# Patient Record
Sex: Female | Born: 1955 | Race: White | Hispanic: No | Marital: Married | Smoking: Never smoker
Health system: Southern US, Community
[De-identification: ages and names within clinical notes are randomized; demographics above are authoritative.]

## PROBLEM LIST (undated history)

## (undated) DIAGNOSIS — G629 Polyneuropathy, unspecified: Secondary | ICD-10-CM

## (undated) DIAGNOSIS — G43909 Migraine, unspecified, not intractable, without status migrainosus: Secondary | ICD-10-CM

## (undated) DIAGNOSIS — M797 Fibromyalgia: Secondary | ICD-10-CM

## (undated) DIAGNOSIS — O4400 Placenta previa specified as without hemorrhage, unspecified trimester: Secondary | ICD-10-CM

## (undated) DIAGNOSIS — Z8041 Family history of malignant neoplasm of ovary: Secondary | ICD-10-CM

## (undated) DIAGNOSIS — M1990 Unspecified osteoarthritis, unspecified site: Secondary | ICD-10-CM

## (undated) DIAGNOSIS — M3500 Sicca syndrome, unspecified: Secondary | ICD-10-CM

## (undated) DIAGNOSIS — M51369 Other intervertebral disc degeneration, lumbar region without mention of lumbar back pain or lower extremity pain: Secondary | ICD-10-CM

## (undated) DIAGNOSIS — M5136 Other intervertebral disc degeneration, lumbar region: Secondary | ICD-10-CM

## (undated) DIAGNOSIS — M5030 Other cervical disc degeneration, unspecified cervical region: Secondary | ICD-10-CM

## (undated) HISTORY — PX: HERNIA REPAIR: SHX51

## (undated) HISTORY — DX: Complete placenta previa nos or without hemorrhage, unspecified trimester: O44.00

## (undated) HISTORY — PX: REPLACEMENT TOTAL KNEE: SUR1224

## (undated) HISTORY — PX: CHOLECYSTECTOMY: SHX55

## (undated) HISTORY — PX: SHOULDER SURGERY: SHX246

## (undated) HISTORY — PX: CERVICAL FUSION: SHX112

## (undated) HISTORY — DX: Family history of malignant neoplasm of ovary: Z80.41

---

## 2012-08-17 DIAGNOSIS — M47817 Spondylosis without myelopathy or radiculopathy, lumbosacral region: Secondary | ICD-10-CM | POA: Insufficient documentation

## 2012-10-03 DIAGNOSIS — M545 Low back pain, unspecified: Secondary | ICD-10-CM | POA: Insufficient documentation

## 2013-01-18 DIAGNOSIS — L509 Urticaria, unspecified: Secondary | ICD-10-CM | POA: Insufficient documentation

## 2013-01-22 DIAGNOSIS — K5730 Diverticulosis of large intestine without perforation or abscess without bleeding: Secondary | ICD-10-CM | POA: Insufficient documentation

## 2013-01-22 DIAGNOSIS — F411 Generalized anxiety disorder: Secondary | ICD-10-CM | POA: Insufficient documentation

## 2013-01-22 DIAGNOSIS — K219 Gastro-esophageal reflux disease without esophagitis: Secondary | ICD-10-CM | POA: Insufficient documentation

## 2013-01-22 DIAGNOSIS — N952 Postmenopausal atrophic vaginitis: Secondary | ICD-10-CM | POA: Insufficient documentation

## 2013-01-22 DIAGNOSIS — G4700 Insomnia, unspecified: Secondary | ICD-10-CM | POA: Insufficient documentation

## 2013-01-22 DIAGNOSIS — D649 Anemia, unspecified: Secondary | ICD-10-CM | POA: Insufficient documentation

## 2013-01-22 DIAGNOSIS — J309 Allergic rhinitis, unspecified: Secondary | ICD-10-CM | POA: Insufficient documentation

## 2013-01-22 DIAGNOSIS — G43909 Migraine, unspecified, not intractable, without status migrainosus: Secondary | ICD-10-CM | POA: Insufficient documentation

## 2013-01-22 DIAGNOSIS — G4733 Obstructive sleep apnea (adult) (pediatric): Secondary | ICD-10-CM | POA: Insufficient documentation

## 2013-01-22 DIAGNOSIS — G2581 Restless legs syndrome: Secondary | ICD-10-CM | POA: Insufficient documentation

## 2013-01-22 DIAGNOSIS — M797 Fibromyalgia: Secondary | ICD-10-CM | POA: Insufficient documentation

## 2013-07-20 DIAGNOSIS — M2550 Pain in unspecified joint: Secondary | ICD-10-CM | POA: Insufficient documentation

## 2013-09-13 DIAGNOSIS — I10 Essential (primary) hypertension: Secondary | ICD-10-CM | POA: Insufficient documentation

## 2013-12-14 DIAGNOSIS — IMO0002 Reserved for concepts with insufficient information to code with codable children: Secondary | ICD-10-CM | POA: Insufficient documentation

## 2014-07-06 DIAGNOSIS — M5030 Other cervical disc degeneration, unspecified cervical region: Secondary | ICD-10-CM | POA: Insufficient documentation

## 2014-07-06 DIAGNOSIS — M542 Cervicalgia: Secondary | ICD-10-CM | POA: Insufficient documentation

## 2015-04-23 DIAGNOSIS — G43009 Migraine without aura, not intractable, without status migrainosus: Secondary | ICD-10-CM | POA: Insufficient documentation

## 2015-04-23 DIAGNOSIS — R519 Headache, unspecified: Secondary | ICD-10-CM | POA: Insufficient documentation

## 2015-04-23 DIAGNOSIS — R51 Headache: Secondary | ICD-10-CM

## 2015-07-23 DIAGNOSIS — M5416 Radiculopathy, lumbar region: Secondary | ICD-10-CM | POA: Insufficient documentation

## 2016-06-07 DIAGNOSIS — M75111 Incomplete rotator cuff tear or rupture of right shoulder, not specified as traumatic: Secondary | ICD-10-CM | POA: Insufficient documentation

## 2016-06-07 DIAGNOSIS — M7551 Bursitis of right shoulder: Secondary | ICD-10-CM | POA: Insufficient documentation

## 2016-12-15 DIAGNOSIS — Z8041 Family history of malignant neoplasm of ovary: Secondary | ICD-10-CM

## 2016-12-15 HISTORY — DX: Family history of malignant neoplasm of ovary: Z80.41

## 2016-12-29 ENCOUNTER — Encounter: Payer: Self-pay | Admitting: Emergency Medicine

## 2016-12-29 ENCOUNTER — Emergency Department
Admission: EM | Admit: 2016-12-29 | Discharge: 2016-12-30 | Disposition: A | Discharge status: Home / Self Care | Payer: 59 | Attending: Emergency Medicine | Admitting: Emergency Medicine

## 2016-12-29 ENCOUNTER — Ambulatory Visit (INDEPENDENT_AMBULATORY_CARE_PROVIDER_SITE_OTHER)
Admission: EM | Admit: 2016-12-29 | Discharge: 2016-12-29 | Disposition: A | Discharge status: Other Acute Inpatient Hospital | Payer: 59 | Source: Home / Self Care | Attending: Family Medicine | Admitting: Family Medicine

## 2016-12-29 DIAGNOSIS — R1031 Right lower quadrant pain: Secondary | ICD-10-CM

## 2016-12-29 DIAGNOSIS — Z79899 Other long term (current) drug therapy: Secondary | ICD-10-CM | POA: Insufficient documentation

## 2016-12-29 DIAGNOSIS — R110 Nausea: Secondary | ICD-10-CM

## 2016-12-29 DIAGNOSIS — R1033 Periumbilical pain: Secondary | ICD-10-CM

## 2016-12-29 HISTORY — DX: Migraine, unspecified, not intractable, without status migrainosus: G43.909

## 2016-12-29 HISTORY — DX: Fibromyalgia: M79.7

## 2016-12-29 HISTORY — DX: Unspecified osteoarthritis, unspecified site: M19.90

## 2016-12-29 HISTORY — DX: Polyneuropathy, unspecified: G62.9

## 2016-12-29 LAB — COMPREHENSIVE METABOLIC PANEL
ALT: 12 U/L — AB (ref 14–54)
ANION GAP: 6 (ref 5–15)
AST: 19 U/L (ref 15–41)
Albumin: 4.3 g/dL (ref 3.5–5.0)
Alkaline Phosphatase: 66 U/L (ref 38–126)
BUN: 13 mg/dL (ref 6–20)
CHLORIDE: 111 mmol/L (ref 101–111)
CO2: 26 mmol/L (ref 22–32)
CREATININE: 0.91 mg/dL (ref 0.44–1.00)
Calcium: 9.6 mg/dL (ref 8.9–10.3)
Glucose, Bld: 97 mg/dL (ref 65–99)
Potassium: 4 mmol/L (ref 3.5–5.1)
Sodium: 143 mmol/L (ref 135–145)
Total Bilirubin: 0.4 mg/dL (ref 0.3–1.2)
Total Protein: 7.4 g/dL (ref 6.5–8.1)

## 2016-12-29 LAB — CBC
HCT: 42 % (ref 35.0–47.0)
Hemoglobin: 14 g/dL (ref 12.0–16.0)
MCH: 29.1 pg (ref 26.0–34.0)
MCHC: 33.3 g/dL (ref 32.0–36.0)
MCV: 87.2 fL (ref 80.0–100.0)
PLATELETS: 251 10*3/uL (ref 150–440)
RBC: 4.81 MIL/uL (ref 3.80–5.20)
RDW: 14.6 % — ABNORMAL HIGH (ref 11.5–14.5)
WBC: 6.7 10*3/uL (ref 3.6–11.0)

## 2016-12-29 LAB — URINALYSIS, COMPLETE (UACMP) WITH MICROSCOPIC
Bacteria, UA: NONE SEEN
Bilirubin Urine: NEGATIVE
GLUCOSE, UA: NEGATIVE mg/dL
KETONES UR: NEGATIVE mg/dL
Nitrite: NEGATIVE
PROTEIN: NEGATIVE mg/dL
Specific Gravity, Urine: 1.003 — ABNORMAL LOW (ref 1.005–1.030)
pH: 7 (ref 5.0–8.0)

## 2016-12-29 LAB — LIPASE, BLOOD: LIPASE: 40 U/L (ref 11–51)

## 2016-12-29 MED ORDER — ONDANSETRON 8 MG PO TBDP
8.0000 mg | ORAL_TABLET | Freq: Once | ORAL | Status: AC
  Administered 2016-12-29: 8 mg via ORAL

## 2016-12-29 NOTE — ED Triage Notes (Signed)
Patient c/o pain in her abdomen at her belly button and to the right side since Sunday.  Patient reports some nausea.

## 2016-12-29 NOTE — ED Provider Notes (Signed)
[facility] Emergency Department Provider Note    First MD Initiated Contact with Patient 12/29/16 2310     (approximate)  I have reviewed the triage vital signs and the nursing notes.   HISTORY  Chief Complaint Abdominal Pain    HPI Amy Huynh is a 61 y.o. female with below list of chronic medical conditions presents to the emergency department with periumbilical/right lower quadrant abdominal pain with maximum intensity of 8 out of 10 currently 3 out of 10. Patient states that the pain began on Sunday (3 days ago) and is accompanied by nausea. Patient denies any vomiting no diarrhea or constipation.atient denies any urinary symptoms.   Past Medical History:  Diagnosis Date  . Arthritis   . Fibromyalgia   . Migraine   . Neuropathy     There are no active problems to display for this patient.   Past Surgical History:  Procedure Laterality Date  . CERVICAL FUSION    . CESAREAN SECTION    . CHOLECYSTECTOMY    . HERNIA REPAIR    . REPLACEMENT TOTAL KNEE Left   . SHOULDER SURGERY Right     Prior to Admission medications   Medication Sig Start Date End Date Taking? Authorizing Provider  butalbital-acetaminophen-caffeine (FIORICET, ESGIC) 50-325-40 MG tablet Take 1 tablet by mouth every 6 (six) hours as needed for headache.    [provider]  cyanocobalamin 1000 MCG tablet Take 1,000 mcg by mouth daily.    [provider]  dicyclomine (BENTYL) 20 MG tablet Take 1 tablet (20 mg total) by mouth 3 (three) times daily as needed for spasms. 12/30/16 12/30/17  Darci Current, MD  ergocalciferol (VITAMIN D2) 50000 units capsule Take 50,000 Units by mouth once a week.    [provider]  esomeprazole (NEXIUM) 40 MG capsule Take 40 mg by mouth daily at 12 noon.    [provider]  fexofenadine (ALLEGRA) 180 MG tablet Take 180 mg by mouth daily.    [provider]  gabapentin (NEURONTIN) 300 MG  capsule Take 900 mg by mouth at bedtime.    [provider]  hydrOXYzine (ATARAX/VISTARIL) 10 MG tablet Take 10 mg by mouth every 8 (eight) hours as needed.    [provider]  hyoscyamine (LEVSIN, ANASPAZ) 0.125 MG tablet Take 0.125 mg by mouth every 4 (four) hours as needed.    [provider]  promethazine (PHENERGAN) 25 MG tablet Take 25 mg by mouth every 6 (six) hours as needed for nausea or vomiting.    [provider]  rizatriptan (MAXALT) 10 MG tablet Take 10 mg by mouth as needed for migraine. May repeat in 2 hours if needed    [provider]  tiZANidine (ZANAFLEX) 4 MG tablet Take 4 mg by mouth at bedtime.    [provider]  topiramate (TOPAMAX) 100 MG tablet Take 200 mg by mouth daily.    [provider]  verapamil (VERELAN PM) 120 MG 24 hr capsule Take 120 mg by mouth at bedtime.    [provider]  zolpidem (AMBIEN CR) 12.5 MG CR tablet Take 12.5 mg by mouth at bedtime as needed for sleep.    [provider]    Allergies Cymbalta [duloxetine hcl]; Lyrica [pregabalin]; and Morphine and related  History reviewed. No pertinent family history.  Social History Social History  Substance Use Topics  . Smoking status: Never Smoker  . Smokeless tobacco: Never Used  . Alcohol use Yes  Review of Systems Constitutional: No fever/chills Eyes: No visual changes. ENT: No sore throat. Cardiovascular: Denies chest pain. Respiratory: Denies shortness of breath. Gastrointestinal:positive for abdominal pain and nausea Genitourinary: Negative for dysuria. Musculoskeletal: Negative for neck pain.  Negative for back pain. Integumentary: Negative for rash. Neurological: Negative for headaches, focal weakness or numbness.   ____________________________________________   PHYSICAL EXAM:  VITAL SIGNS: ED Triage Vitals  Enc Vitals Group     BP 12/29/16 2050 (!) 162/83     Pulse Rate 12/29/16 2050 61      Resp 12/29/16 2050 16     Temp 12/29/16 2050 98.3 F (36.8 C)     Temp Source 12/29/16 2050 Oral     SpO2 12/29/16 2050 100 %     Weight 12/29/16 2050 79.4 kg (175 lb)     Height --      Head Circumference --      Peak Flow --      Pain Score 12/29/16 2300 3     Pain Loc --      Pain Edu? --      Excl. in GC? --     Constitutional: Alert and oriented. Well appearing and in no acute distress. Eyes: Conjunctivae are normal.  Head: Atraumatic. Mouth/Throat: Mucous membranes are moist. Oropharynx non-erythematous. Neck: No stridor.   Cardiovascular: Normal rate, regular rhythm. Good peripheral circulation. Grossly normal heart sounds. Respiratory: Normal respiratory effort.  No retractions. Lungs CTAB. Gastrointestinal: RLQ/Per umbilical tenderness to palpation. No distention.   Musculoskeletal: No lower extremity tenderness nor edema. No gross deformities of extremities. Neurologic:  Normal speech and language. No gross focal neurologic deficits are appreciated.  Skin:  Skin is warm, dry and intact. No rash noted. Psychiatric: Mood and affect are normal. Speech and behavior are normal.  ____________________________________________   LABS (all labs ordered are listed, but only abnormal results are displayed)  Labs Reviewed  COMPREHENSIVE METABOLIC PANEL - Abnormal; Notable for the following:       Result Value   ALT 12 (*)    All other components within normal limits  CBC - Abnormal; Notable for the following:    RDW 14.6 (*)    All other components within normal limits  URINALYSIS, COMPLETE (UACMP) WITH MICROSCOPIC - Abnormal; Notable for the following:    Color, Urine STRAW (*)    APPearance CLEAR (*)    Specific Gravity, Urine 1.003 (*)    Hgb urine dipstick SMALL (*)    Leukocytes, UA TRACE (*)    Squamous Epithelial / LPF 0-5 (*)    All other components within normal limits  LIPASE, BLOOD    RADIOLOGY I, Bridgeton N Jeannett Dekoning, personally viewed and evaluated these  images (plain radiographs) as part of my medical decision making, as well as reviewing the written report by the radiologist.  Ct Abdomen Pelvis W Contrast  Result Date: 12/30/2016 CLINICAL DATA:  Lower abdominal pain EXAM: CT ABDOMEN AND PELVIS WITH CONTRAST TECHNIQUE: Multidetector CT imaging of the abdomen and pelvis was performed using the standard protocol following bolus administration of intravenous contrast. CONTRAST:  100mL ISOVUE-300 IOPAMIDOL (ISOVUE-300) INJECTION 61% COMPARISON:  None. FINDINGS: Lower chest: No acute abnormality. Hepatobiliary: Status post cholecystectomy. Mild intra and extrahepatic biliary dilatation, extrahepatic bile duct enlarged up to 13 mm. No focal hepatic abnormality Pancreas: Unremarkable. No pancreatic ductal dilatation or surrounding inflammatory changes. Spleen: Normal in size without focal abnormality. Adrenals/Urinary Tract: Adrenal glands are unremarkable. Kidneys are normal, without renal calculi, focal lesion, or  hydronephrosis. Bladder is unremarkable. Stomach/Bowel: Stomach is within normal limits. Appendix appears normal. No evidence of bowel wall thickening, distention, or inflammatory changes. Vascular/Lymphatic: No significant vascular findings are present. No enlarged abdominal or pelvic lymph nodes. Reproductive: Uterus and bilateral adnexa are unremarkable. Other: Negative for free air or free fluid. Musculoskeletal: Degenerative changes. No acute or suspicious bone lesion IMPRESSION: No CT evidence for acute intra-abdominal or pelvic pathology. Negative for appendicitis. Electronically Signed   By: Jasmine Pang M.D.   On: 12/30/2016 00:29     Procedures   ____________________________________________   INITIAL IMPRESSION / ASSESSMENT AND PLAN / ED COURSE  Pertinent labs & imaging results that were available during my care of the patient were reviewed by me and considered in my medical decision making (see chart for details).  No clear  etiology for the patient's abdominal pain identified a CT scan is negative likewise laboratory data including UA      ____________________________________________  FINAL CLINICAL IMPRESSION(S) / ED DIAGNOSES  Final diagnoses:  Right lower quadrant abdominal pain     MEDICATIONS GIVEN DURING THIS VISIT:  Medications  iopamidol (ISOVUE-300) 61 % injection 100 mL (100 mLs Intravenous Contrast Given 12/30/16 0009)  dicyclomine (BENTYL) capsule 10 mg (10 mg Oral Given 12/30/16 0037)     NEW OUTPATIENT MEDICATIONS STARTED DURING THIS VISIT:  Discharge Medication List as of 12/30/2016 12:57 AM    START taking these medications   Details  dicyclomine (BENTYL) 20 MG tablet Take 1 tablet (20 mg total) by mouth 3 (three) times daily as needed for spasms., Starting Thu 12/30/2016, Until Fri 12/30/2017, Print        Discharge Medication List as of 12/30/2016 12:57 AM      Discharge Medication List as of 12/30/2016 12:57 AM       Note:  This document was prepared using Dragon voice recognition software and may include unintentional dictation errors.    Darci Current, MD 12/30/16 [phone]

## 2016-12-29 NOTE — ED Provider Notes (Addendum)
[facility] ____________________________________________  Time seen: Approximately 8:30 PM  I have reviewed the triage vital signs and the nursing notes.   HISTORY  Chief Complaint Abdominal Pain   HPI Amy Huynh is a 61 y.o. female  presenting with husband at bedside, for evaluation of mid abdominal pain at her bellybutton that has began to radiate to her right lower quadrant. Patient states that she has been having abdominal pain for the last 3 days with accompanying nausea and bloated gassy sensation. States that she has tried over-the-counter Gas-X as well as Zofran without any resolution. medications taken today. describes pain as a current mild to moderate pain with some intermittent increase in intensity. states she did notice that as the pain increased with walking and moving around the pain remains at rest. Reports prior history of cholecystectomy, C-section and IBS. Reports last bowel movement today and described as normal. Denies abnormal color. No actual vomiting. Reports some flushing sensation, denies known fevers. No history of similar in past.   Denies chest pain, shortness of breath, dysuria, extremity pain, extremity swelling or rash. Denies recent sickness. Denies recent antibiotic use. Patient also reports family history of onset with ovarian cancer and states suspected  female cancer for mother and grandmother as well.    Past Medical History:  Diagnosis Date  . Arthritis   . Fibromyalgia   . Migraine   . Neuropathy     There are no active problems to display for this patient.   Past Surgical History:  Procedure Laterality Date  . CERVICAL FUSION    . CESAREAN SECTION    . CHOLECYSTECTOMY    . HERNIA REPAIR    . REPLACEMENT TOTAL KNEE Left   . SHOULDER SURGERY Right      No current facility-administered medications for this encounter.   Current Outpatient Prescriptions:  .  butalbital-acetaminophen-caffeine (FIORICET, ESGIC)  50-325-40 MG tablet, Take 1 tablet by mouth every 6 (six) hours as needed for headache., Disp: , Rfl:  .  cyanocobalamin 1000 MCG tablet, Take 1,000 mcg by mouth daily., Disp: , Rfl:  .  ergocalciferol (VITAMIN D2) 50000 units capsule, Take 50,000 Units by mouth once a week., Disp: , Rfl:  .  esomeprazole (NEXIUM) 40 MG capsule, Take 40 mg by mouth daily at 12 noon., Disp: , Rfl:  .  fexofenadine (ALLEGRA) 180 MG tablet, Take 180 mg by mouth daily., Disp: , Rfl:  .  gabapentin (NEURONTIN) 300 MG capsule, Take 900 mg by mouth at bedtime., Disp: , Rfl:  .  hydrOXYzine (ATARAX/VISTARIL) 10 MG tablet, Take 10 mg by mouth every 8 (eight) hours as needed., Disp: , Rfl:  .  hyoscyamine (LEVSIN, ANASPAZ) 0.125 MG tablet, Take 0.125 mg by mouth every 4 (four) hours as needed., Disp: , Rfl:  .  promethazine (PHENERGAN) 25 MG tablet, Take 25 mg by mouth every 6 (six) hours as needed for nausea or vomiting., Disp: , Rfl:  .  rizatriptan (MAXALT) 10 MG tablet, Take 10 mg by mouth as needed for migraine. May repeat in 2 hours if needed, Disp: , Rfl:  .  tiZANidine (ZANAFLEX) 4 MG tablet, Take 4 mg by mouth at bedtime., Disp: , Rfl:  .  topiramate (TOPAMAX) 100 MG tablet, Take 200 mg by mouth daily., Disp: , Rfl:  .  verapamil (VERELAN PM) 120 MG 24 hr capsule, Take 120 mg by mouth at bedtime., Disp: , Rfl:  .  zolpidem (AMBIEN CR) 12.5 MG CR tablet, Take 12.5  mg by mouth at bedtime as needed for sleep., Disp: , Rfl:   Allergies Cymbalta [duloxetine hcl]; Lyrica [pregabalin]; and Morphine and related  family history Mother-hysterectomy when young- possible cancer Aunt; ovarian cancer  Social History Social History  Substance Use Topics  . Smoking status: Never Smoker  . Smokeless tobacco: Never Used  . Alcohol use Yes    Review of Systems ENT: No sore throat. Cardiovascular: Denies chest pain. Respiratory: Denies shortness of breath. Gastrointestinal: as above.  Genitourinary: Negative for  dysuria. Musculoskeletal: Negative for back pain. Skin: Negative for rash.  ____________________________________________   PHYSICAL EXAM:  VITAL SIGNS: ED Triage Vitals  Enc Vitals Group     BP 12/29/16 1941 (!) 166/65     Pulse Rate 12/29/16 1941 69     Resp 12/29/16 1941 16     Temp 12/29/16 1941 98.2 F (36.8 C)     Temp Source 12/29/16 1941 Oral     SpO2 12/29/16 1941 100 %     Weight 12/29/16 1937 175 lb (79.4 kg)     Height 12/29/16 1937 5' 2.5" (1.588 m)     Head Circumference --      Peak Flow --      Pain Score 12/29/16 1937 6     Pain Loc --      Pain Edu? --      Excl. in GC? --     Constitutional: Alert and oriented. Well appearing and in no acute distress. Cardiovascular: Normal rate, regular rhythm. Grossly normal heart sounds.  Good peripheral circulation. Respiratory: Normal respiratory effort without tachypnea nor retractions. Breath sounds are clear and equal bilaterally. No wheezes, rales, rhonchi. Gastrointestinal:  mild to moderate focal umbilical and right lower quadrant tenderness at McBurney's point, pain to right lower quadrant present with tapping of right heel. Abdomen otherwise soft and nontender. Normal Bowel sounds. No CVA tenderness. Musculoskeletal:  Steady gait. Neurologic:  Normal speech and language. Speech is normal. No gait instability.  Skin:  Skin is warm, dry Psychiatric: Mood and affect are normal. Speech and behavior are normal. Patient exhibits appropriate insight and judgment   ___________________________________________   LABS (all labs ordered are listed, but only abnormal results are displayed)  Labs Reviewed - No data to display  PROCEDURES Procedures     INITIAL IMPRESSION / ASSESSMENT AND PLAN / ED COURSE  Pertinent labs & imaging results that were available during my care of the patient were reviewed by me and considered in my medical decision making (see chart for details).  Well-appearing patient. Initially  with vitamin Casimiro Needle pain is now radiating to right lower quadrant. Discussed in detail with patient and spouse, concern for appendicitis. No CT available at this facility at this time. Recommend further evaluation emergency room of her choice this time. Patient states that she will go to [facility]. Triage nurse Butch, called and given report. Patient stable at time of discharge. States husband will transport her. Encouraged NPO. 8mg  odt zofran given.   ____________________________________________   FINAL CLINICAL IMPRESSION(S) / ED DIAGNOSES  Final diagnoses:  Right lower quadrant abdominal pain  Periumbilical abdominal pain  Nausea without vomiting     Discharge Medication List as of 12/29/2016  8:10 PM      Note: This dictation was prepared with Dragon dictation along with smaller phrase technology. Any transcriptional errors that result from this process are unintentional.         Renford Dills, NP 12/29/16 2109

## 2016-12-29 NOTE — ED Notes (Addendum)
ED Provider at bedside. 

## 2016-12-29 NOTE — ED Notes (Signed)
Patient c/o lower medial abdominal pain radiating to LRQ. Pt reports nausea, denies fever, emesis and diarrhea.

## 2016-12-29 NOTE — Discharge Instructions (Signed)
Proceed directly to Emergency room.

## 2016-12-29 NOTE — ED Triage Notes (Signed)
Pt c/o lower abdominal pain ;ocated under the umbilicus, radiating to the right side. Pt seen at urgent care in advised to come to ED for further evaluation of possible appendicitis. Pt has had pain and nausea since Sunday. Pt denies emesis and denies urinary symptoms.

## 2016-12-29 NOTE — ED Notes (Addendum)
MD BRown at bedside at this time with this RN for assessment and update of care plan

## 2016-12-30 ENCOUNTER — Emergency Department: Payer: 59

## 2016-12-30 ENCOUNTER — Encounter: Payer: Self-pay | Admitting: Radiology

## 2016-12-30 IMAGING — CT CT ABD-PELV W/ CM
2 of 5 series · 16 of 46 positions shown, 18 images · IV contrast (APPLIED)
Comparison: None.

CLINICAL DATA: Lower abdominal pain

EXAM:
CT ABDOMEN AND PELVIS WITH CONTRAST
TECHNIQUE: Multidetector CT imaging of the abdomen and pelvis was performed
using the standard protocol following bolus administration of
intravenous contrast.
CONTRAST:  100mL [9M] IOPAMIDOL ([9M]) INJECTION 61%

[Series 2: routine abd/pel with · axial · 0.82mm/px · z∈[-721,-276]mm · 13 of 99 slices shown, 15 images]
[im 5/99  soft-tissue]
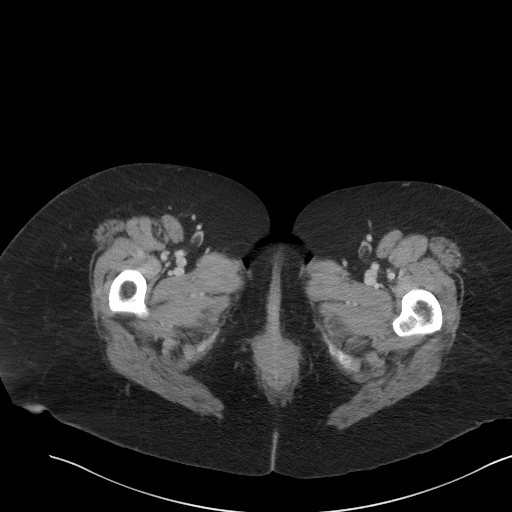
[im 5/99  bone]
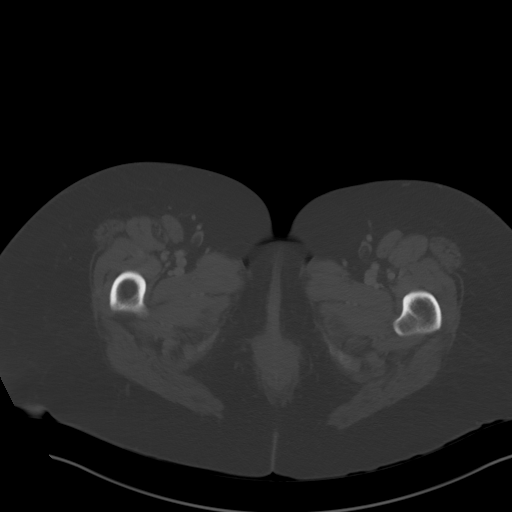
[im 15/99  soft-tissue]
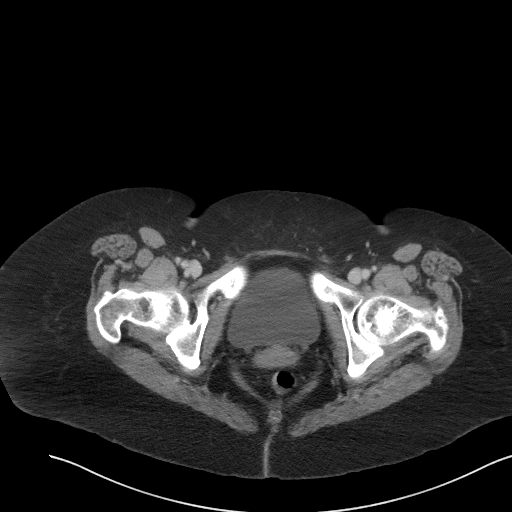
[im 20/99  soft-tissue]
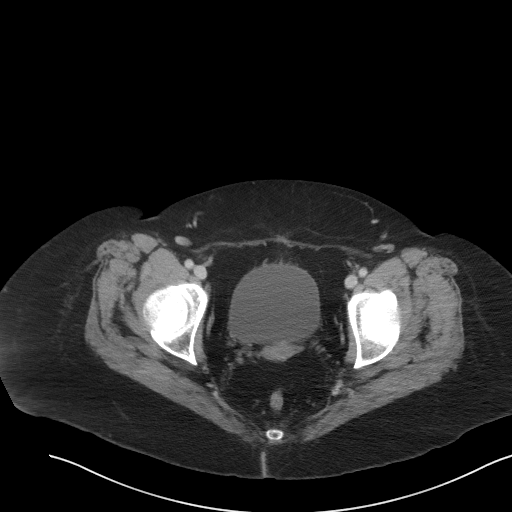
[im 30/99  soft-tissue]
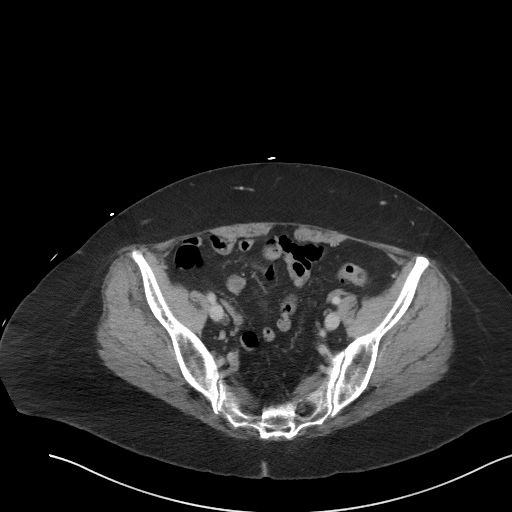
[im 35/99  soft-tissue]
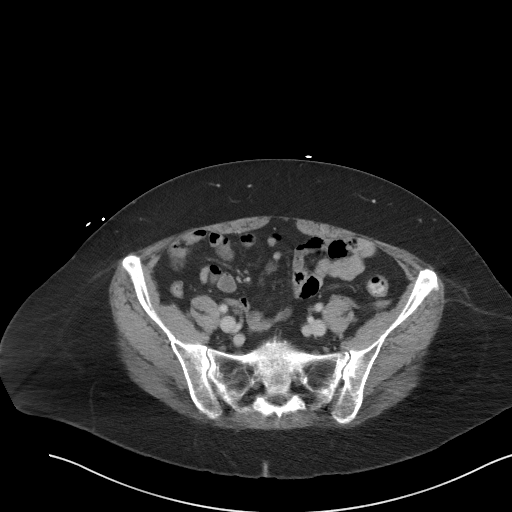
[im 45/99  soft-tissue]
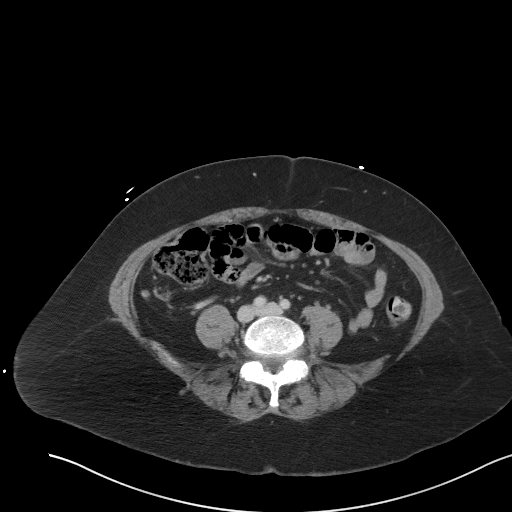
[im 50/99  soft-tissue]
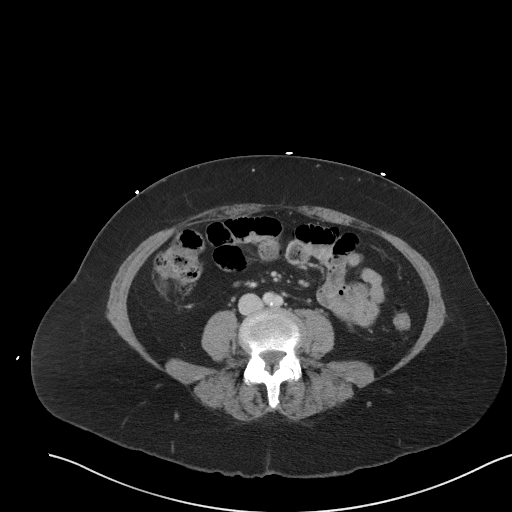
[im 54/99  soft-tissue]
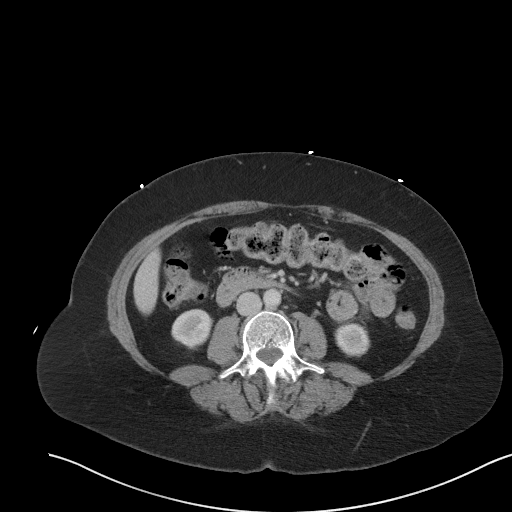
[im 64/99  soft-tissue]
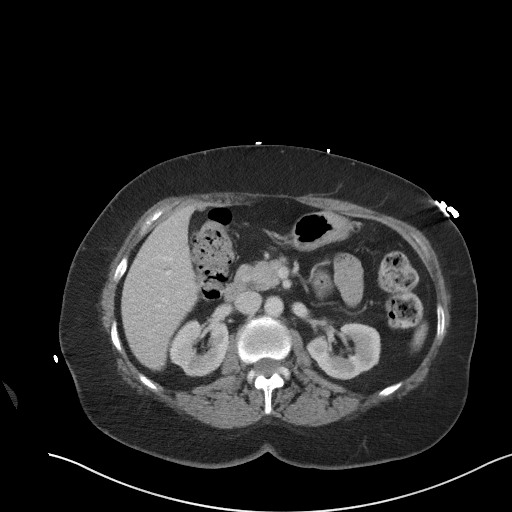
[im 64/99  bone]
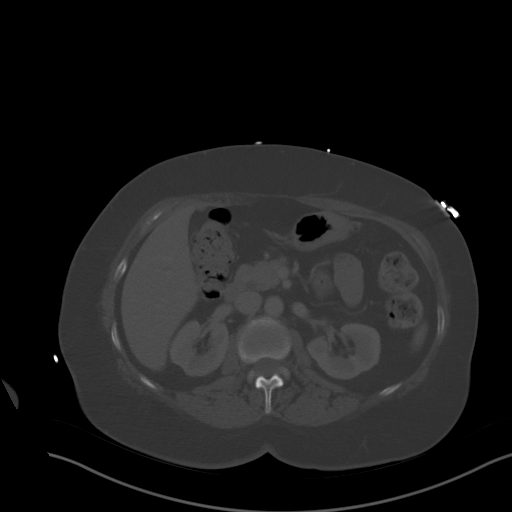
[im 69/99  soft-tissue]
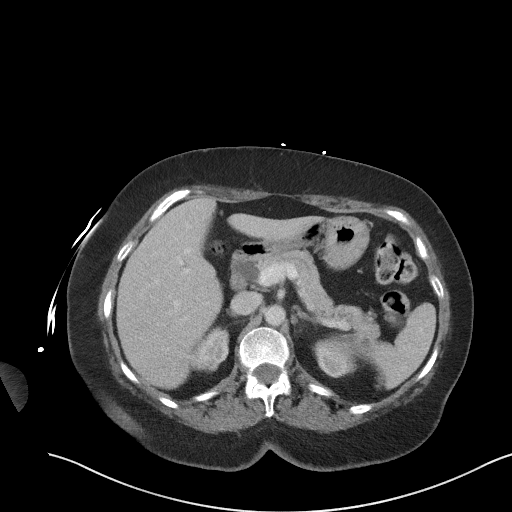
[im 79/99  soft-tissue]
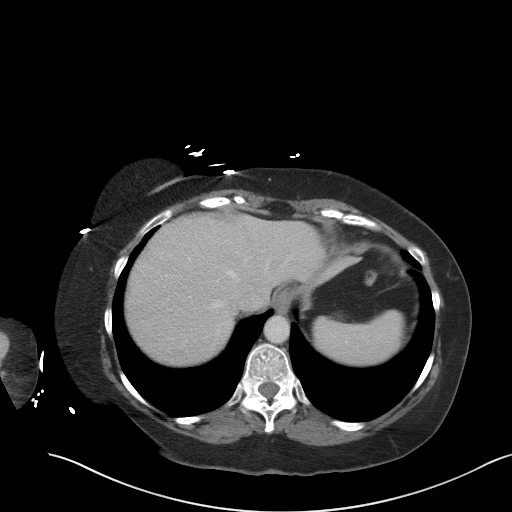
[im 84/99  soft-tissue]
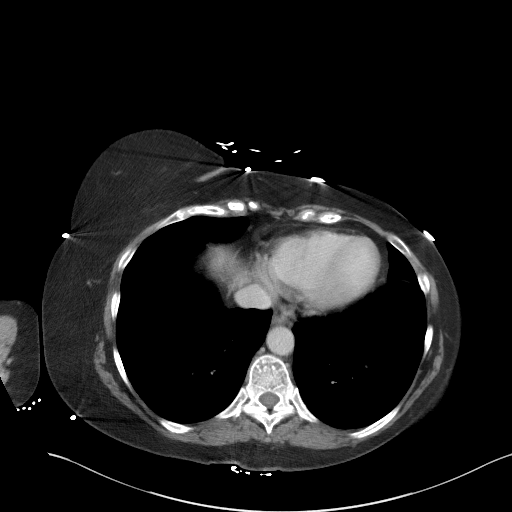
[im 94/99  soft-tissue]
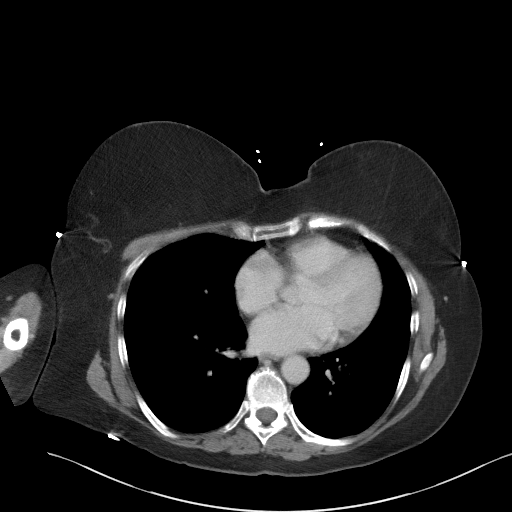

[Series 5: coronal st · coronal · 0.68mm/px · 3 of 88 slices shown]
[im 30/88  soft-tissue]
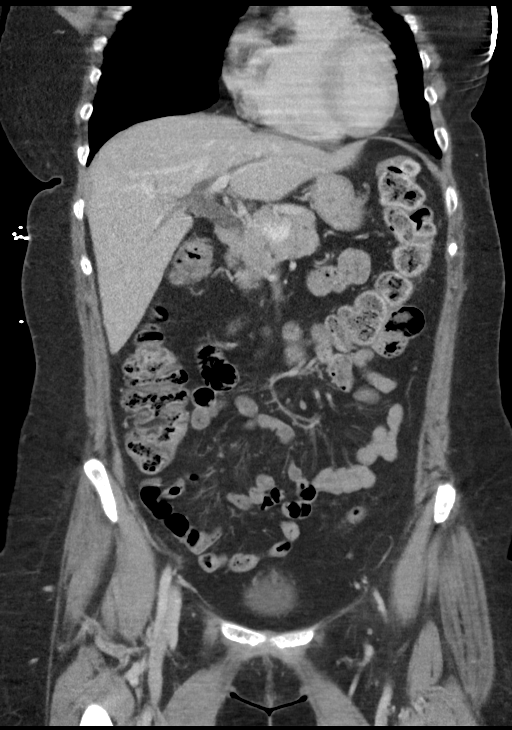
[im 39/88  soft-tissue]
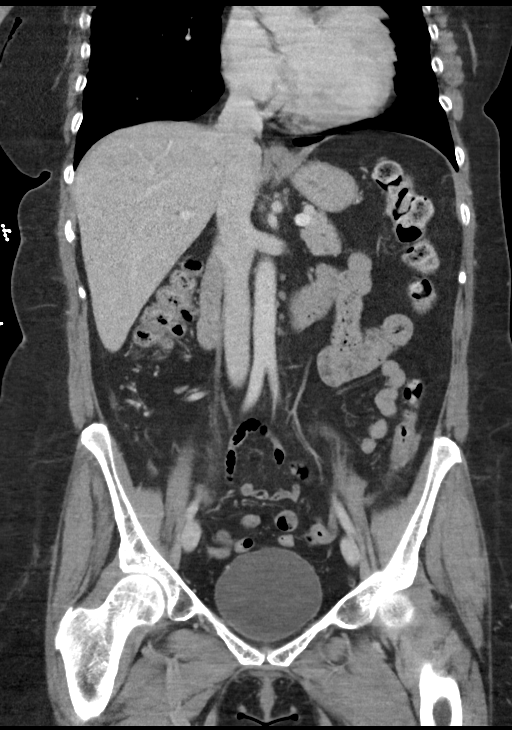
[im 49/88  soft-tissue]
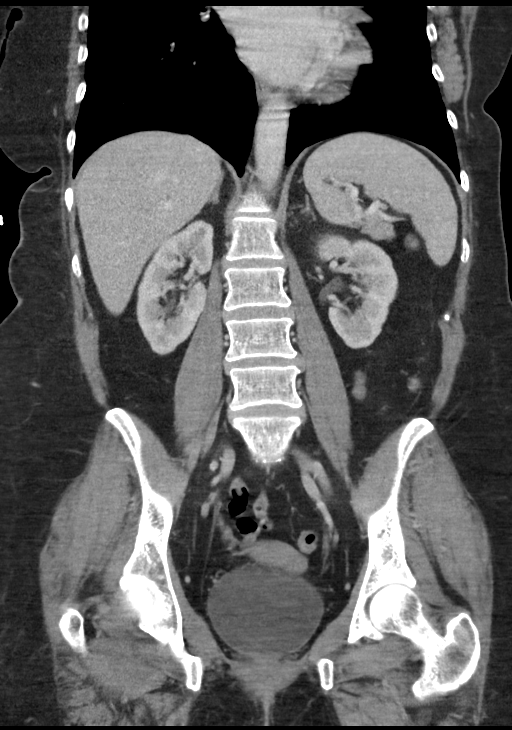

[16 of 46 positions shown; findings below may reference images not displayed]

FINDINGS: Lower chest: No acute abnormality.

Hepatobiliary: Status post cholecystectomy. Mild intra and
extrahepatic biliary dilatation, extrahepatic bile duct enlarged up
to 13 mm. No focal hepatic abnormality

Pancreas: Unremarkable. No pancreatic ductal dilatation or
surrounding inflammatory changes.

Spleen: Normal in size without focal abnormality.

Adrenals/Urinary Tract: Adrenal glands are unremarkable. Kidneys are
normal, without renal calculi, focal lesion, or hydronephrosis.
Bladder is unremarkable.

Stomach/Bowel: Stomach is within normal limits. Appendix appears
normal. No evidence of bowel wall thickening, distention, or
inflammatory changes.

Vascular/Lymphatic: No significant vascular findings are present. No
enlarged abdominal or pelvic lymph nodes.

Reproductive: Uterus and bilateral adnexa are unremarkable.

Other: Negative for free air or free fluid.

Musculoskeletal: Degenerative changes. No acute or suspicious bone
lesion
IMPRESSION: No CT evidence for acute intra-abdominal or pelvic pathology.
Negative for appendicitis.

## 2016-12-30 MED ORDER — DICYCLOMINE HCL 20 MG PO TABS: 20.0000 mg | ORAL_TABLET | Freq: Three times a day (TID) | ORAL | 0 refills | Status: DC | PRN

## 2016-12-30 MED ORDER — IOPAMIDOL (ISOVUE-300) INJECTION 61%
100.0000 mL | Freq: Once | INTRAVENOUS | Status: AC | PRN
  Administered 2016-12-30: 100 mL via INTRAVENOUS

## 2016-12-30 MED ORDER — DICYCLOMINE HCL 10 MG PO CAPS
10.0000 mg | ORAL_CAPSULE | Freq: Once | ORAL | Status: AC
  Administered 2016-12-30: 10 mg via ORAL
  Filled 2016-12-30: qty 1

## 2016-12-30 NOTE — ED Notes (Signed)
Pt. Going home with spouse.

## 2016-12-30 NOTE — ED Notes (Signed)
ED Provider at bedside. 

## 2017-01-11 ENCOUNTER — Encounter: Payer: Self-pay | Admitting: Obstetrics and Gynecology

## 2017-01-11 ENCOUNTER — Ambulatory Visit (INDEPENDENT_AMBULATORY_CARE_PROVIDER_SITE_OTHER): Payer: 59 | Admitting: Obstetrics and Gynecology

## 2017-01-11 VITALS — BP 124/78 | Ht 62.0 in | Wt 175.0 lb

## 2017-01-11 DIAGNOSIS — Z8041 Family history of malignant neoplasm of ovary: Secondary | ICD-10-CM

## 2017-01-11 DIAGNOSIS — R1031 Right lower quadrant pain: Secondary | ICD-10-CM

## 2017-01-11 NOTE — Progress Notes (Signed)
Chief Complaint  Patient presents with  . Pelvic Pain  . Follow-up    ER Follow Up    HPI:      Amy Huynh is a 62 y.o. [phone] who LMP was No LMP recorded. Patient is postmenopausal., presents today for NP ER f/u for RLQ pain. Sx started about 2 wks ago and were severe. She went to ED and had neg CT scan and urine, and was referred to GI. She has a hx of diverticulosis, IBS and colon polyps on past colonoscopy. She has noted nausea, fatigue, and anorexia recently, with some mild bloating. She denies any wt loss, fevers, urin sx. Pain has improved the past 2 days. She has tried hyoscyamine without relief of pain.   She is no longer sex active due to vaginal dryness and no arousal. She went through menopause at age 35.   She has a FH of ovar cancer in her aunt and uterine cancer in her mom and MGM. Genetic testing not done.     Past Medical History:  Diagnosis Date  . Arthritis   . Fibromyalgia   . Migraine   . Neuropathy   . Placenta previa     Past Surgical History:  Procedure Laterality Date  . CERVICAL FUSION    . CESAREAN SECTION    . CHOLECYSTECTOMY    . HERNIA REPAIR    . REPLACEMENT TOTAL KNEE Left   . SHOULDER SURGERY Right     Family History  Problem Relation Age of Onset  . Ovarian cancer Maternal Aunt 30  . Uterine cancer Mother        7s  . Uterine cancer Maternal Grandmother        ? age    Social History   Social History  . Marital status: Married    Spouse name: N/A  . Number of children: N/A  . Years of education: N/A   Occupational History  . Not on file.   Social History Main Topics  . Smoking status: Never Smoker  . Smokeless tobacco: Never Used  . Alcohol use Yes  . Drug use: Unknown  . Sexual activity: Not Currently    Birth control/ protection: Post-menopausal   Other Topics Concern  . Not on file   Social History Narrative  . No narrative on file     Current Outpatient Prescriptions:  .   butalbital-acetaminophen-caffeine (FIORICET, ESGIC) 50-325-40 MG tablet, Take 1 tablet by mouth every 6 (six) hours as needed for headache., Disp: , Rfl:  .  cyanocobalamin 1000 MCG tablet, Take 1,000 mcg by mouth daily., Disp: , Rfl:  .  dicyclomine (BENTYL) 20 MG tablet, Take 1 tablet (20 mg total) by mouth 3 (three) times daily as needed for spasms., Disp: 30 tablet, Rfl: 0 .  ergocalciferol (VITAMIN D2) 50000 units capsule, Take 50,000 Units by mouth once a week., Disp: , Rfl:  .  esomeprazole (NEXIUM) 40 MG capsule, Take 40 mg by mouth daily at 12 noon., Disp: , Rfl:  .  fexofenadine (ALLEGRA) 180 MG tablet, Take 180 mg by mouth daily., Disp: , Rfl:  .  gabapentin (NEURONTIN) 300 MG capsule, Take 900 mg by mouth at bedtime., Disp: , Rfl:  .  hydrOXYzine (ATARAX/VISTARIL) 10 MG tablet, Take 10 mg by mouth every 8 (eight) hours as needed., Disp: , Rfl:  .  hyoscyamine (LEVSIN, ANASPAZ) 0.125 MG tablet, Take 0.125 mg by mouth every 4 (four) hours as needed., Disp: , Rfl:  .  promethazine (PHENERGAN) 25 MG tablet, Take 25 mg by mouth every 6 (six) hours as needed for nausea or vomiting., Disp: , Rfl:  .  rizatriptan (MAXALT) 10 MG tablet, Take 10 mg by mouth as needed for migraine. May repeat in 2 hours if needed, Disp: , Rfl:  .  tiZANidine (ZANAFLEX) 4 MG tablet, Take 4 mg by mouth at bedtime., Disp: , Rfl:  .  topiramate (TOPAMAX) 100 MG tablet, Take 200 mg by mouth daily., Disp: , Rfl:  .  verapamil (VERELAN PM) 120 MG 24 hr capsule, Take 120 mg by mouth at bedtime., Disp: , Rfl:  .  zolpidem (AMBIEN CR) 12.5 MG CR tablet, Take 12.5 mg by mouth at bedtime as needed for sleep., Disp: , Rfl:    ROS:  Review of Systems  Constitutional: Positive for appetite change. Negative for chills and fever.  Gastrointestinal: Positive for nausea. Negative for blood in stool, constipation, diarrhea and vomiting.  Genitourinary: Positive for pelvic pain. Negative for dyspareunia, dysuria, flank pain,  frequency, hematuria, urgency, vaginal bleeding, vaginal discharge and vaginal pain.  Musculoskeletal: Negative for back pain.  Skin: Negative for rash.     OBJECTIVE:   Vitals:  BP 124/78   Ht 5\' 2"  (1.575 m)   Wt 175 lb (79.4 kg)   BMI 32.01 kg/m   Physical Exam  Constitutional: She is oriented to person, place, and time and well-developed, well-nourished, and in no distress. Vital signs are normal.  Abdominal: There is tenderness in the right lower quadrant and suprapubic area. There is no rebound and no guarding.  Genitourinary: Vagina normal, uterus normal, cervix normal, left adnexa normal and vulva normal. Uterus is not enlarged. Cervix exhibits no motion tenderness and no tenderness. Right adnexum displays tenderness. Right adnexum displays no mass. Left adnexum displays no mass and no tenderness. Vulva exhibits no erythema, no exudate, no lesion, no rash and no tenderness. Vagina exhibits no lesion.  Neurological: She is oriented to person, place, and time.  Psychiatric: Mood, memory, affect and judgment normal.  Vitals reviewed.   Assessment/Plan: RLQ abdominal pain - Neg CT scan at ED. Hx of diverticulosis and current anorexia. Most likely GI etiology. Tender on exam. F/u with GI (appt 02/14/17). Sx improving x 2 days.   Family history of ovarian cancer - MyRisk testing discussed and done today. Will call pt with results.  - Plan: Integrated [facility]     Return if symptoms worsen or fail to improve.  Siddhi Dornbush B. Shaquira Moroz, PA-C 01/11/2017 9:27 AM

## 2017-01-20 ENCOUNTER — Other Ambulatory Visit: Payer: Self-pay | Admitting: Family Medicine

## 2017-01-24 ENCOUNTER — Encounter: Payer: Self-pay | Admitting: Obstetrics and Gynecology

## 2017-01-31 ENCOUNTER — Telehealth: Payer: Self-pay | Admitting: Obstetrics and Gynecology

## 2017-01-31 ENCOUNTER — Encounter: Payer: Self-pay | Admitting: Obstetrics and Gynecology

## 2017-01-31 NOTE — Telephone Encounter (Signed)
Spoke with pt re: neg MyRisk results. Riskscore=3.8%. Nothing further to do at this time. Hard copy mailed to pt for her records. She is aware neg testing does not mean she will never get any of the hereditary cancers. F/u prn.  RLQ pain also improving. Pt has GI appt 02/14/17.

## 2017-02-14 ENCOUNTER — Encounter: Payer: Self-pay | Admitting: Gastroenterology

## 2017-02-14 ENCOUNTER — Other Ambulatory Visit: Payer: Self-pay

## 2017-02-14 ENCOUNTER — Ambulatory Visit (INDEPENDENT_AMBULATORY_CARE_PROVIDER_SITE_OTHER): Payer: 59 | Admitting: Gastroenterology

## 2017-02-14 VITALS — BP 162/84 | HR 52 | Ht 62.5 in | Wt 177.0 lb

## 2017-02-14 DIAGNOSIS — K582 Mixed irritable bowel syndrome: Secondary | ICD-10-CM

## 2017-02-14 NOTE — Progress Notes (Signed)
Gastroenterology Consultation  Referring Provider:     No ref. provider found Primary Care Physician:  Oletha Blend, MD Primary Gastroenterologist:  Dr. Servando Snare     Reason for Consultation:     Lower abdominal pain        HPI:   Amy Huynh is a 61 y.o. y/o female referred for consultation & management of Lower abdominal pain by Dr. Oletha Blend, MD.  This patient comes in today to establish care with me since she has previously followed up at [facility].  The patient states her insurance is change in she needs to follow-up with either do or [facility].  The patient has had a colonoscopy in the past with an adenoma found at that time.  The patient also reports that she was told to have a repeat colonoscopy in 5 years.  The patient now reports that she had severe abdominal pain in her lower abdomen that to be a few weeks but has completely resolved.  The patient was seen by her physical therapist who told her that she should go to urgent care for possible appendicitis.  The patient went to urgent care and a sensitive to the ER.  She reports that the ER did a CT scan and found no appendicitis in [facility] home.  She reports that her symptoms were not consistent with her typical irritable bowel syndromes.  The patient denies any black stools or bloody stools.  She also denies any fevers chills nausea or vomiting.  There is no report of any unexplained weight loss.  Her irritable bowel syndrome consists of alternating diarrhea and constipation.  Past Medical History:  Diagnosis Date  . Arthritis   . Family history of ovarian cancer 12/2016   MyRisk neg  . Fibromyalgia   . Migraine   . Neuropathy   . Placenta previa     Past Surgical History:  Procedure Laterality Date  . CERVICAL FUSION    . CESAREAN SECTION    . CHOLECYSTECTOMY    . HERNIA REPAIR    . REPLACEMENT TOTAL KNEE Left   . SHOULDER SURGERY Right     Prior to Admission medications   Medication Sig Start Date End  Date Taking? Authorizing Provider  aspirin-acetaminophen-caffeine (EXCEDRIN MIGRAINE) [phone] MG tablet Take by mouth.   Yes [provider]  butalbital-acetaminophen-caffeine (FIORICET, ESGIC) 50-325-40 MG tablet Take 1 tablet by mouth every 6 (six) hours as needed for headache.   Yes [provider]  cyanocobalamin 1000 MCG tablet Take 1,000 mcg by mouth daily.   Yes [provider]  dicyclomine (BENTYL) 20 MG tablet Take 1 tablet (20 mg total) by mouth 3 (three) times daily as needed for spasms. 12/30/16 12/30/17 Yes Darci Current, MD  Doxepin HCl 5 % CREA Apply three times a day to itchy areas 07/14/16  Yes [provider]  ERENUMAB-AOOE [place] Inject into the skin. 12/27/16  Yes [provider]  ergocalciferol (VITAMIN D2) 50000 units capsule Take 50,000 Units by mouth once a week.   Yes [provider]  esomeprazole (NEXIUM) 40 MG capsule Take 40 mg by mouth daily at 12 noon.   Yes [provider]  estradiol (ESTRACE) 0.1 MG/GM vaginal cream I 0.5 GRAMS VAGINALLY 2 TIMES A WEEK AT NIGHT UTD 08/09/16  Yes [provider]  fexofenadine (ALLEGRA) 180 MG tablet Take 180 mg by mouth daily.   Yes [provider]  fluocinonide cream (LIDEX) 0.05 % Apply topically. 07/14/16 07/14/17  Yes [provider]  gabapentin (NEURONTIN) 300 MG capsule Take 900 mg by mouth at bedtime.   Yes [provider]  Gabapentin Enacarbil ER 300 MG TBCR Take by mouth. 07/27/16  Yes [provider]  hydrOXYzine (ATARAX/VISTARIL) 10 MG tablet Take 10 mg by mouth every 8 (eight) hours as needed.   Yes [provider]  hyoscyamine (LEVSIN, ANASPAZ) 0.125 MG tablet Take 0.125 mg by mouth every 4 (four) hours as needed.   Yes [provider]  Naltrexone POWD Take by mouth. 01/05/17  Yes [provider]  promethazine (PHENERGAN) 25 MG tablet Take 25 mg by mouth every 6 (six) hours as needed for nausea or  vomiting.   Yes [provider]  rizatriptan (MAXALT) 10 MG tablet Take 10 mg by mouth as needed for migraine. May repeat in 2 hours if needed   Yes [provider]  tiZANidine (ZANAFLEX) 4 MG tablet Take 4 mg by mouth at bedtime.   Yes [provider]  topiramate (TOPAMAX) 100 MG tablet Take 200 mg by mouth daily.   Yes [provider]  valACYclovir (VALTREX) 1000 MG tablet Take 1 tablet once a day by mouth 07/14/16  Yes [provider]  verapamil (VERELAN PM) 120 MG 24 hr capsule Take 120 mg by mouth at bedtime.   Yes [provider]  zolpidem (AMBIEN CR) 12.5 MG CR tablet Take 12.5 mg by mouth at bedtime as needed for sleep.   Yes [provider]    Family History  Problem Relation Age of Onset  . Ovarian cancer Maternal Aunt 30  . Uterine cancer Mother        79s  . Uterine cancer Maternal Grandmother        ? age     Social History  Substance Use Topics  . Smoking status: Never Smoker  . Smokeless tobacco: Never Used  . Alcohol use Yes    Allergies as of 02/14/2017 - Review Complete 02/14/2017  Allergen Reaction Noted  . Hydrocodone-acetaminophen  09/24/2015  . Cymbalta [duloxetine hcl] Other (See Comments) 12/29/2016  . Lyrica [pregabalin] Other (See Comments) 12/29/2016  . Morphine and related Itching 12/29/2016    Review of Systems:    All systems reviewed and negative except where noted in HPI.   Physical Exam:  BP (!) 162/84   Pulse (!) 52   Ht 5' 2.5" (1.588 m)   Wt 177 lb (80.3 kg)   BMI 31.86 kg/m  No LMP recorded. Patient is postmenopausal. Psych:  Alert and cooperative. Normal mood and affect. General:   Alert,  Well-developed, well-nourished, pleasant and cooperative in NAD Head:  Normocephalic and atraumatic. Eyes:  Sclera clear, no icterus.   Conjunctiva pink. Ears:  Normal auditory acuity. Nose:  No deformity, discharge, or lesions. Mouth:  No deformity or lesions,oropharynx pink &  moist. Neck:  Supple; no masses or thyromegaly. Lungs:  Respirations even and unlabored.  Clear throughout to auscultation.   No wheezes, crackles, or rhonchi. No acute distress. Heart:  Regular rate and rhythm; no murmurs, clicks, rubs, or gallops. Abdomen:  Normal bowel sounds.  No bruits.  Soft, non-tender and non-distended without masses, hepatosplenomegaly or hernias noted.  No guarding or rebound tenderness.  Negative Carnett sign.   Rectal:  Deferred.  Msk:  Symmetrical without gross deformities.  Good, equal movement & strength bilaterally. Pulses:  Normal pulses noted. Extremities:  No clubbing or edema.  No cyanosis. Neurologic:  Alert and oriented x3;  grossly normal neurologically. Skin:  Intact without significant lesions or rashes.  No jaundice. Lymph Nodes:  No significant cervical adenopathy. Psych:  Alert and cooperative. Normal mood and affect.  Imaging Studies: No results found.  Assessment and Plan:   Amy Huynh is a 61 y.o. y/o female who has a history of irritable bowel syndrome who is now transferring her care to me since her insurance has changed.  The patient has no further abdominal pain at this time.  The pain is not reproducible on physical exam today.  The patient has been told that if the pain returns she should try and do maneuvers to see if this is musculoskeletal pain since is not consistent with her irritable bowel syndrome and her CT scan was negative.  If the pain return and it is not consistent with musculoskeletal pain she has been told to come back and see me. The patient has been explained the plan and agrees with it.  Midge Minium, MD. Clementeen Graham   Note: This dictation was prepared with Dragon dictation along with smaller phrase technology. Any transcriptional errors that result from this process are unintentional.

## 2017-08-02 ENCOUNTER — Emergency Department: Payer: BC Managed Care – PPO

## 2017-08-02 ENCOUNTER — Other Ambulatory Visit: Payer: Self-pay

## 2017-08-02 ENCOUNTER — Encounter: Payer: Self-pay | Admitting: Emergency Medicine

## 2017-08-02 DIAGNOSIS — I10 Essential (primary) hypertension: Secondary | ICD-10-CM | POA: Diagnosis not present

## 2017-08-02 DIAGNOSIS — Z79899 Other long term (current) drug therapy: Secondary | ICD-10-CM | POA: Diagnosis not present

## 2017-08-02 DIAGNOSIS — R079 Chest pain, unspecified: Secondary | ICD-10-CM | POA: Diagnosis present

## 2017-08-02 LAB — BASIC METABOLIC PANEL
ANION GAP: 10 (ref 5–15)
BUN: 17 mg/dL (ref 6–20)
CHLORIDE: 101 mmol/L (ref 101–111)
CO2: 26 mmol/L (ref 22–32)
Calcium: 9.2 mg/dL (ref 8.9–10.3)
Creatinine, Ser: 0.84 mg/dL (ref 0.44–1.00)
GFR calc non Af Amer: >60 mL/min (ref >60)
Glucose, Bld: 105 mg/dL — ABNORMAL HIGH (ref 65–99)
Potassium: 3.5 mmol/L (ref 3.5–5.1)
Sodium: 137 mmol/L (ref 135–145)

## 2017-08-02 LAB — CBC
HCT: 44.3 % (ref 35.0–47.0)
HEMOGLOBIN: 14.4 g/dL (ref 12.0–16.0)
MCH: 27.2 pg (ref 26.0–34.0)
MCHC: 32.4 g/dL (ref 32.0–36.0)
MCV: 83.9 fL (ref 80.0–100.0)
Platelets: 282 10*3/uL (ref 150–440)
RBC: 5.28 MIL/uL — AB (ref 3.80–5.20)
RDW: 13.1 % (ref 11.5–14.5)
WBC: 8.6 10*3/uL (ref 3.6–11.0)

## 2017-08-02 LAB — TROPONIN I

## 2017-08-02 IMAGING — CR DG CHEST 2V
2 series · 2 of 2 positions shown · non-contrast
Comparison: None.

CLINICAL DATA: Hypertension for the past week. Left shoulder,
scapular and chest pain.

EXAM:
CHEST - 2 VIEW

[chest pa]
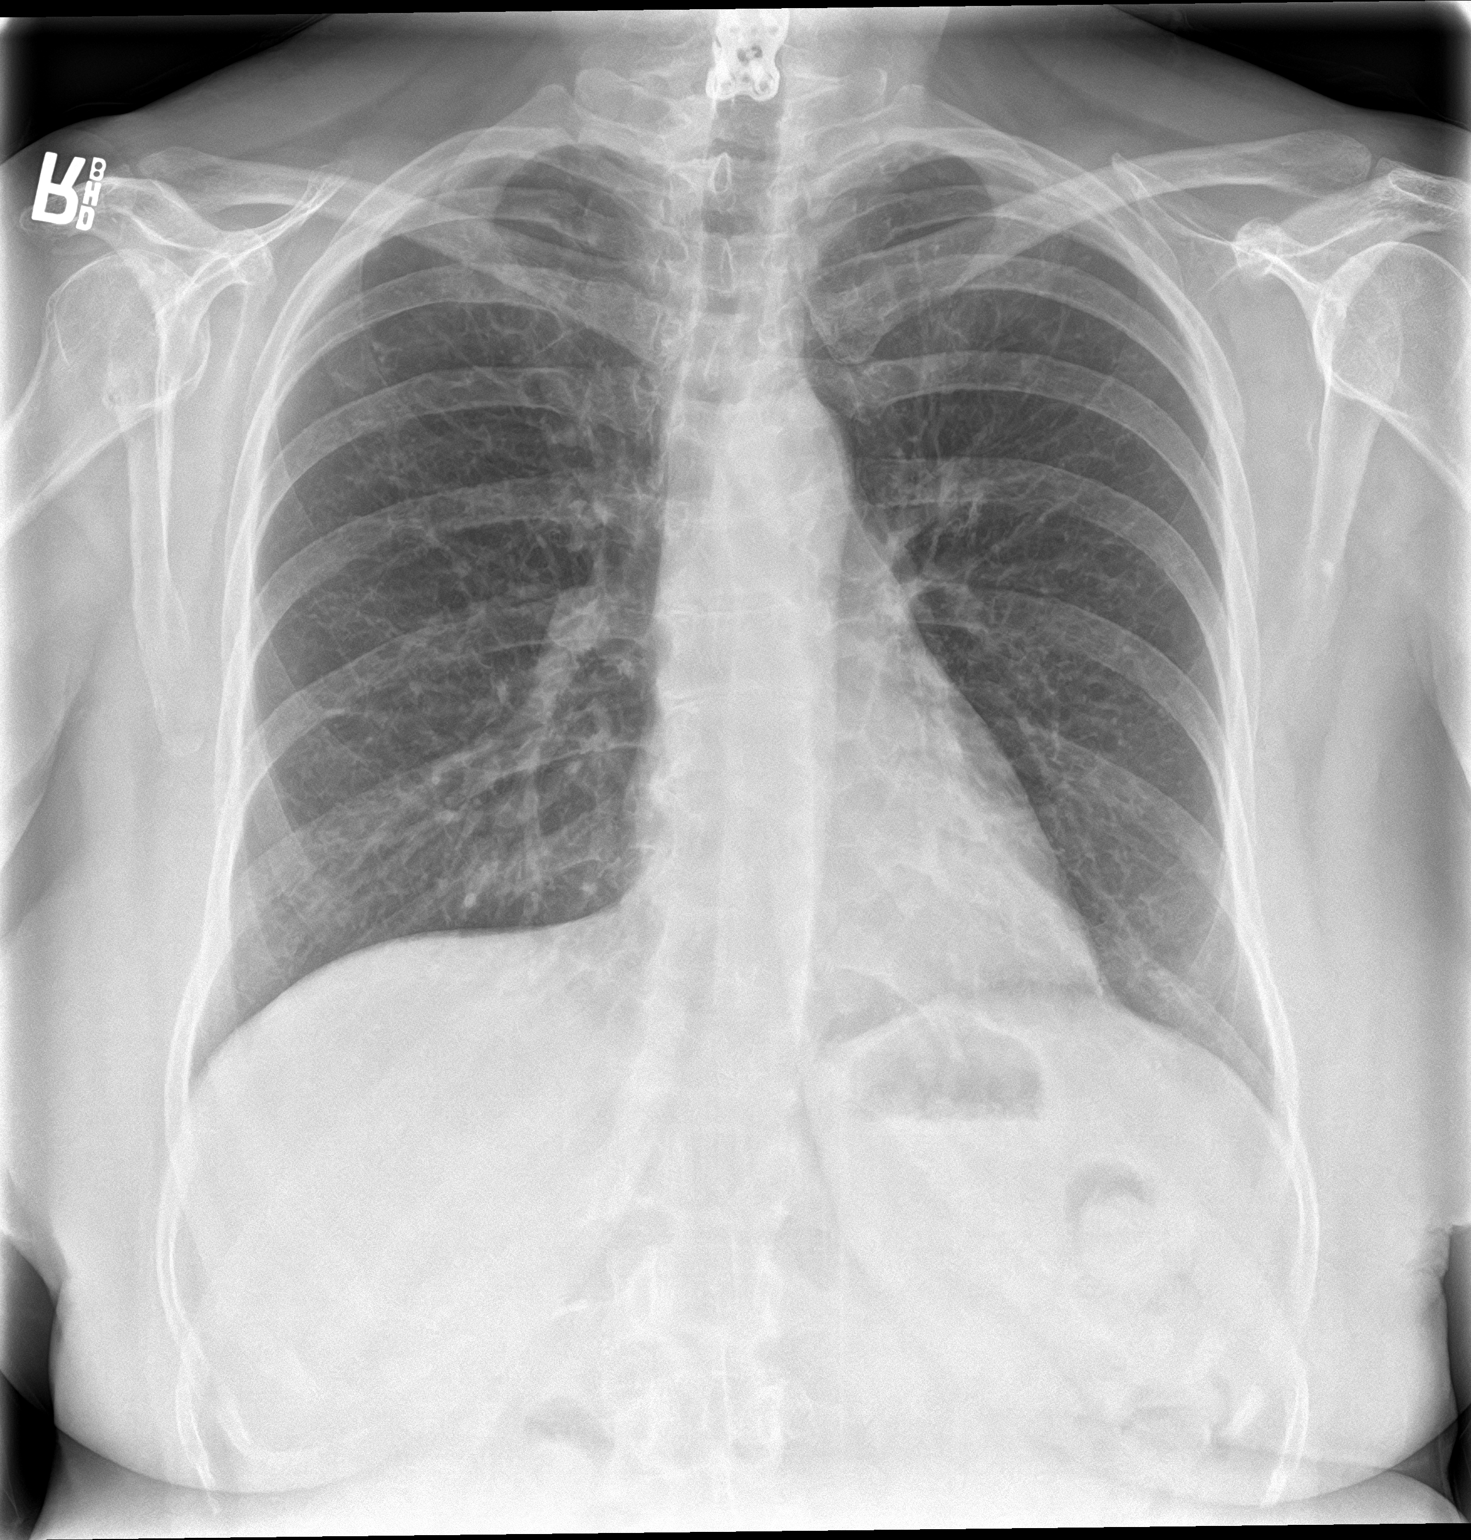

[chest lat]
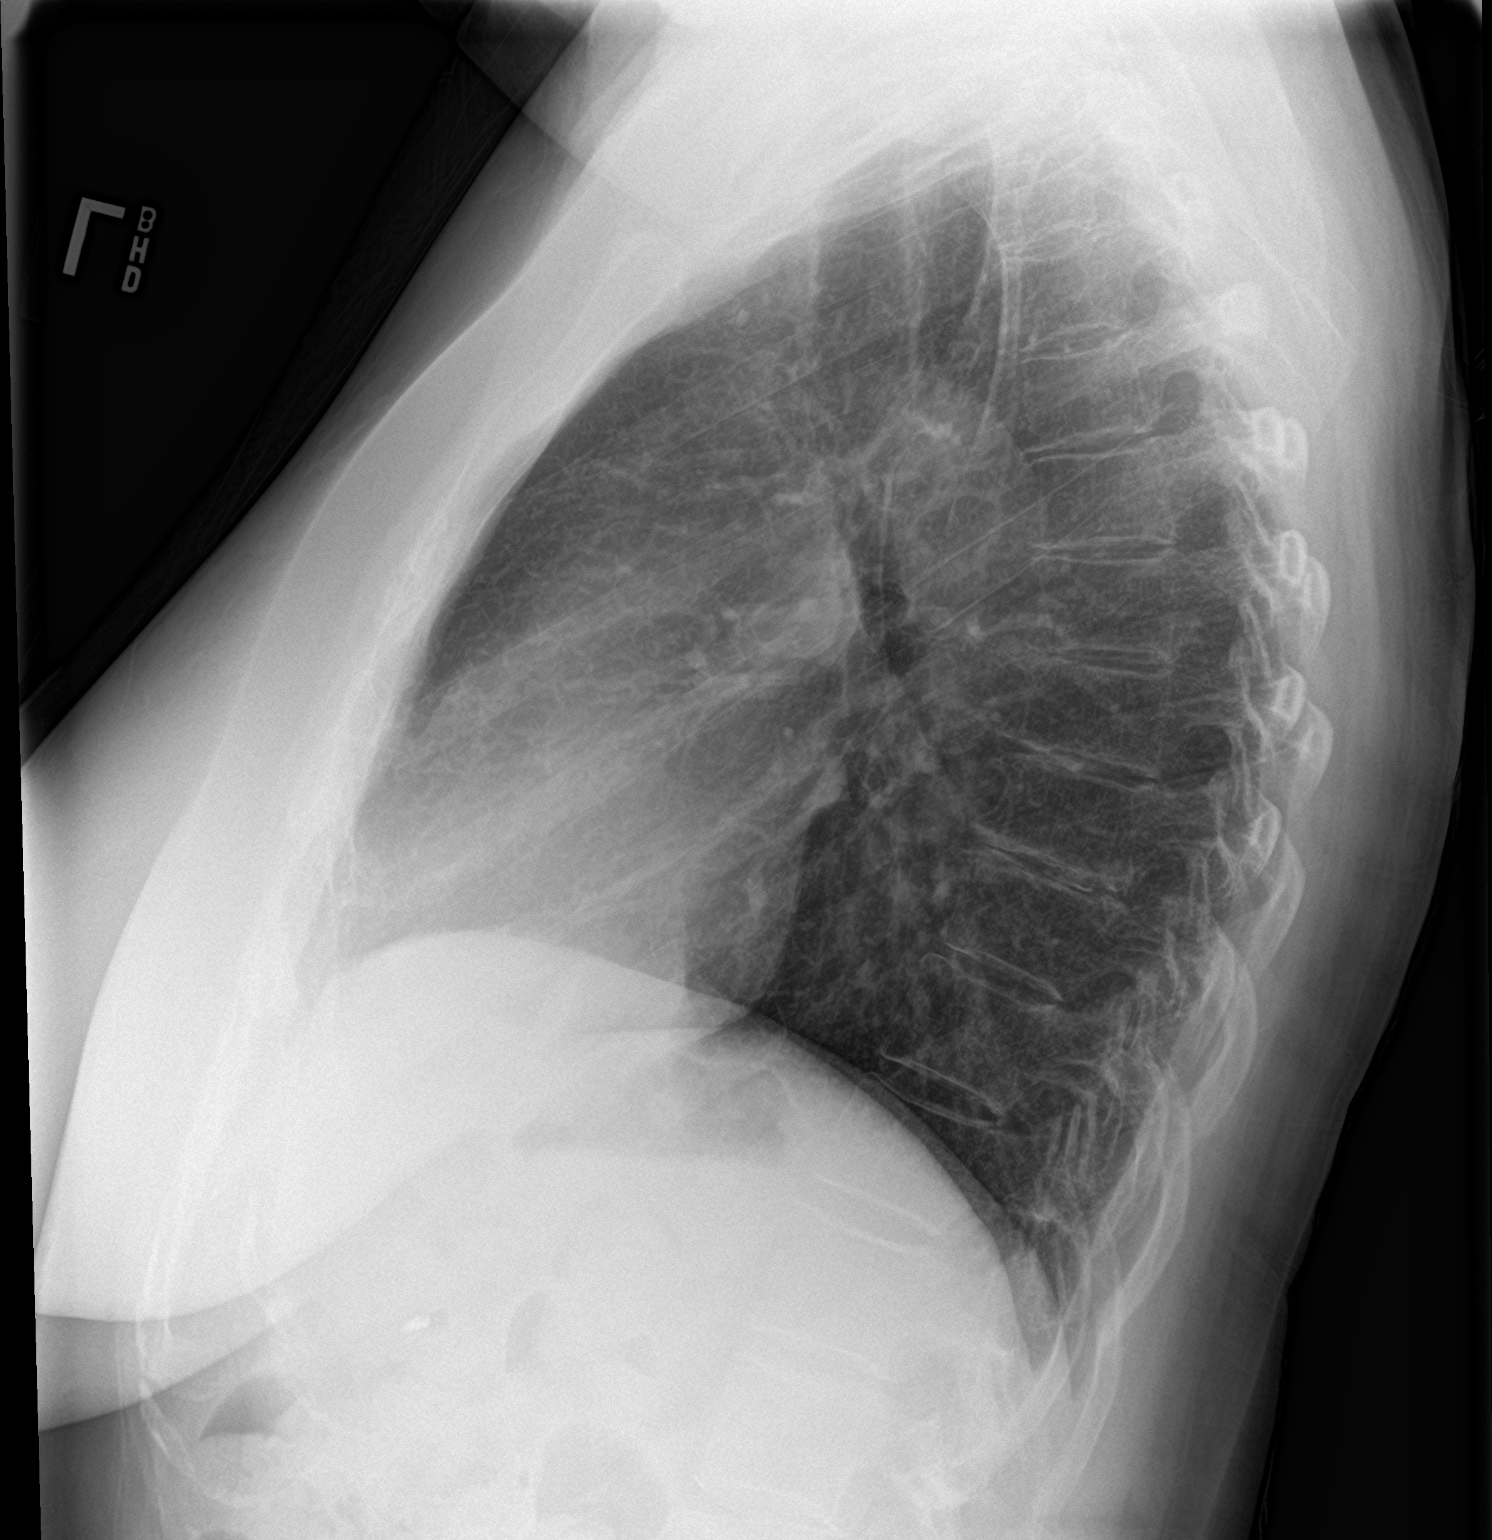

[2 of 2 positions shown; findings below may reference images not displayed]

FINDINGS: Partially imaged lower cervical anterior cervical disc fusion
hardware. Heart and mediastinal contours are normal in size. There
is mild aortic atherosclerosis at the arch without aneurysm. No
acute pneumonic consolidation, CHF, effusion or pneumothorax. No
suspicious osseous abnormality. Mild degenerative changes are seen
along the midthoracic spine. Cholecystectomy clips are present in
the right upper quadrant of the abdomen.
IMPRESSION: Minimal aortic atherosclerosis.  No active pulmonary disease.

## 2017-08-02 NOTE — ED Triage Notes (Signed)
Pt arrived to the ED for complaints of high blood pressure for the last week and today began to experience left shoulder, scapula/ chest pain. Pt is AOx4 in no apparent distress. Pt has no previous cardiac history.

## 2017-08-03 ENCOUNTER — Emergency Department
Admission: EM | Admit: 2017-08-03 | Discharge: 2017-08-03 | Disposition: A | Discharge status: Home / Self Care | Payer: BC Managed Care – PPO | Attending: Emergency Medicine | Admitting: Emergency Medicine

## 2017-08-03 DIAGNOSIS — I10 Essential (primary) hypertension: Secondary | ICD-10-CM

## 2017-08-03 LAB — TROPONIN I: Troponin I: <0.03 ng/mL (ref <0.03)

## 2017-08-03 LAB — TSH: TSH: 2.527 u[IU]/mL (ref 0.350–4.500)

## 2017-08-03 LAB — FIBRIN DERIVATIVES D-DIMER (ARMC ONLY): Fibrin derivatives D-dimer (ARMC): 307.02 ng/mL (FEU) (ref 0.00–499.00)

## 2017-08-05 NOTE — ED Provider Notes (Signed)
[facility] Emergency Department Provider Note   First MD Initiated Contact with Patient 08/03/17 [phone]     (approximate)  I have reviewed the triage vital signs and the nursing notes.   HISTORY  Chief Complaint Chest Pain    HPI Amy Huynh is a 62 y.o. female with below list of chronic medical conditions including remote history of hypertension however medications were discontinued secondary to hypotension fibromyalgia migraine and neuropathy presents to the emergency department with market fluctuations in blood pressure.  Patient blood pressure tonight 207/93 on arrival.  Patient states that she has had multiple episodes of both high and low blood pressure at home.  Patient states her blood pressure was undetectable by her blood pressure cuff and then subsequently a friend who is a nurse attempted to take her blood pressure and was not able to acquire one.  Patient states during this period of time she does have some lightheadedness.   Past Medical History:  Diagnosis Date  . Arthritis   . Family history of ovarian cancer 12/2016   MyRisk neg  . Fibromyalgia   . Migraine   . Neuropathy   . Placenta previa     Patient Active Problem List   Diagnosis Date Noted  . Family history of ovarian cancer 01/11/2017  . Acute bursitis of right shoulder 06/07/2016  . Partial tear of right rotator cuff 06/07/2016  . Lumbar radiculopathy 07/23/2015  . Migraine without aura and without status migrainosus, not intractable 04/23/2015  . Chronic daily headache 04/23/2015  . DDD (degenerative disc disease), cervical 07/06/2014  . Neck pain 07/06/2014  . Dyspareunia 12/14/2013  . Benign essential hypertension 09/13/2013  . Polyarthralgia 07/20/2013  . Diverticula of colon 01/22/2013  . Fibromyalgia 01/22/2013  . Restless legs syndrome 01/22/2013  . Moderate obstructive sleep apnea 01/22/2013  . Migraine headache 01/22/2013  . Insomnia 01/22/2013  .  Generalized anxiety disorder 01/22/2013  . Esophageal reflux 01/22/2013  . Atrophic vaginitis 01/22/2013  . Anemia 01/22/2013  . Allergic rhinitis 01/22/2013  . Urticaria 01/18/2013  . Low back pain 10/03/2012  . Lumbosacral spondylosis 08/17/2012    Past Surgical History:  Procedure Laterality Date  . CERVICAL FUSION    . CESAREAN SECTION    . CHOLECYSTECTOMY    . HERNIA REPAIR    . REPLACEMENT TOTAL KNEE Left   . SHOULDER SURGERY Right     Prior to Admission medications   Medication Sig Start Date End Date Taking? Authorizing Provider  aspirin-acetaminophen-caffeine (EXCEDRIN MIGRAINE) [phone] MG tablet Take by mouth.    [provider]  butalbital-acetaminophen-caffeine (FIORICET, ESGIC) 50-325-40 MG tablet Take 1 tablet by mouth every 6 (six) hours as needed for headache.    [provider]  cyanocobalamin 1000 MCG tablet Take 1,000 mcg by mouth daily.    [provider]  dicyclomine (BENTYL) 20 MG tablet Take 1 tablet (20 mg total) by mouth 3 (three) times daily as needed for spasms. 12/30/16 12/30/17  Darci Current, MD  Doxepin HCl 5 % CREA Apply three times a day to itchy areas 07/14/16   [provider]  ERENUMAB-AOOE [place] Inject into the skin. 12/27/16   [provider]  ergocalciferol (VITAMIN D2) 50000 units capsule Take 50,000 Units by mouth once a week.    [provider]  esomeprazole (NEXIUM) 40 MG capsule Take 40 mg by mouth daily at 12 noon.    [provider]  estradiol (ESTRACE) 0.1 MG/GM vaginal cream I  0.5 GRAMS VAGINALLY 2 TIMES A WEEK AT NIGHT UTD 08/09/16   [provider]  fexofenadine (ALLEGRA) 180 MG tablet Take 180 mg by mouth daily.    [provider]  gabapentin (NEURONTIN) 300 MG capsule Take 900 mg by mouth at bedtime.    [provider]  Gabapentin Enacarbil ER 300 MG TBCR Take by mouth. 07/27/16   [provider]  hydrOXYzine (ATARAX/VISTARIL) 10 MG  tablet Take 10 mg by mouth every 8 (eight) hours as needed.    [provider]  hyoscyamine (LEVSIN, ANASPAZ) 0.125 MG tablet Take 0.125 mg by mouth every 4 (four) hours as needed.    [provider]  Naltrexone POWD Take by mouth. 01/05/17   [provider]  promethazine (PHENERGAN) 25 MG tablet Take 25 mg by mouth every 6 (six) hours as needed for nausea or vomiting.    [provider]  rizatriptan (MAXALT) 10 MG tablet Take 10 mg by mouth as needed for migraine. May repeat in 2 hours if needed    [provider]  tiZANidine (ZANAFLEX) 4 MG tablet Take 4 mg by mouth at bedtime.    [provider]  topiramate (TOPAMAX) 100 MG tablet Take 200 mg by mouth daily.    [provider]  valACYclovir (VALTREX) 1000 MG tablet Take 1 tablet once a day by mouth 07/14/16   [provider]  verapamil (VERELAN PM) 120 MG 24 hr capsule Take 120 mg by mouth at bedtime.    [provider]  zolpidem (AMBIEN CR) 12.5 MG CR tablet Take 12.5 mg by mouth at bedtime as needed for sleep.    [provider]    Allergies Hydrocodone-acetaminophen; Cymbalta [duloxetine hcl]; Lyrica [pregabalin]; and Morphine and related  Family History  Problem Relation Age of Onset  . Ovarian cancer Maternal Aunt 30  . Uterine cancer Mother        1840s  . Uterine cancer Maternal Grandmother        ? age    Social History Social History   Tobacco Use  . Smoking status: Never Smoker  . Smokeless tobacco: Never Used  Substance Use Topics  . Alcohol use: Yes  . Drug use: Not on file    Review of Systems Constitutional: No fever/chills Eyes: No visual changes. ENT: No sore throat. Cardiovascular: Denies chest pain. Respiratory: Denies shortness of breath. Gastrointestinal: No abdominal pain.  No nausea, no vomiting.  No diarrhea.  No constipation. Genitourinary: Negative for dysuria. Musculoskeletal: Negative for neck pain.  Negative  for back pain. Integumentary: Negative for rash. Neurological: Negative for headaches, focal weakness or numbness.   ____________________________________________   PHYSICAL EXAM:  VITAL SIGNS: ED Triage Vitals  Enc Vitals Group     BP 08/02/17 2104 (!) 207/93     Pulse Rate 08/02/17 2104 74     Resp 08/02/17 2104 18     Temp 08/02/17 2104 97.6 F (36.4 C)     Temp Source 08/02/17 2104 Oral     SpO2 08/02/17 2104 99 %     Weight 08/02/17 2105 79.8 kg (176 lb)     Height 08/02/17 2105 1.575 m (5\' 2" )     Head Circumference --      Peak Flow --      Pain Score 08/02/17 2105 6     Pain Loc --      Pain Edu? --      Excl. in GC? --  Constitutional: Alert and oriented. Well appearing and in no acute distress. Eyes: Conjunctivae are normal.  Head: Atraumatic. Mouth/Throat: Mucous membranes are moist.  Oropharynx non-erythematous. Neck: No stridor.  No meningeal signs.  No cervical spine tenderness to palpation. Cardiovascular: Normal rate, regular rhythm. Good peripheral circulation. Grossly normal heart sounds. Respiratory: Normal respiratory effort.  No retractions. Lungs CTAB. Gastrointestinal: Soft and nontender. No distention.  Musculoskeletal: No lower extremity tenderness nor edema. No gross deformities of extremities. Neurologic:  Normal speech and language. No gross focal neurologic deficits are appreciated.  Skin:  Skin is warm, dry and intact. No rash noted. Psychiatric: Mood and affect are normal. Speech and behavior are normal.  ____________________________________________   LABS (all labs ordered are listed, but only abnormal results are displayed)  Labs Reviewed  BASIC METABOLIC PANEL - Abnormal; Notable for the following components:      Result Value   Glucose, Bld 105 (*)    All other components within normal limits  CBC - Abnormal; Notable for the following components:   RBC 5.28 (*)    All other components within normal limits  TROPONIN I  TSH    TROPONIN I  FIBRIN DERIVATIVES D-DIMER ([facility] ONLY)   ____________________________________________  EKG  ED ECG REPORT I, [place] N Aleysha Meckler, the attending physician, personally viewed and interpreted this ECG.   Date: 08/05/2017  EKG Time: 9:07 PM  Rate: 71  Rhythm: Normal sinus rhythm  Axis: Normal  Intervals: Normal  ST&T Change: None  ____________________________________________  RADIOLOGY   ED MD interpretation: Acute cardiopulmonary abnormality noted on chest x-ray      Procedures   ____________________________________________   INITIAL IMPRESSION / ASSESSMENT AND PLAN / ED COURSE  As part of my medical decision making, I reviewed the following data within the electronic MEDICAL RECORD NUMBER   62 year old female presented with above-stated history and physical exam secondary to market fluctuations in blood pressure and left scapular pain.  Consider possibility of cardiac etiology for the patient's left scapular pain however EKG revealed no evidence of ischemia or infarction, troponin negative x2.  Consider the possibility of pulmonary emboli however d-dimer negative and a low risk patient.  Patient's TSH is likewise normal.  Without any intervention the patient's blood pressure improved to 143/78 and as such no antihypertensive was administered.  Spoke with the patient at length regarding necessity of following up with primary care provider     ____________________________________________  FINAL CLINICAL IMPRESSION(S) / ED DIAGNOSES  Final diagnoses:  Hypertension, unspecified type     MEDICATIONS GIVEN DURING THIS VISIT:  Medications - No data to display   ED Discharge Orders    None       Note:  This document was prepared using Dragon voice recognition software and may include unintentional dictation errors.    Darci Current, MD 08/05/17 [phone]

## 2017-10-11 DIAGNOSIS — M3500 Sicca syndrome, unspecified: Secondary | ICD-10-CM | POA: Insufficient documentation

## 2018-02-23 ENCOUNTER — Ambulatory Visit
Admission: EM | Admit: 2018-02-23 | Discharge: 2018-02-23 | Disposition: A | Discharge status: Home / Self Care | Payer: BC Managed Care – PPO | Attending: Family Medicine | Admitting: Family Medicine

## 2018-02-23 ENCOUNTER — Other Ambulatory Visit: Payer: Self-pay

## 2018-02-23 ENCOUNTER — Ambulatory Visit (INDEPENDENT_AMBULATORY_CARE_PROVIDER_SITE_OTHER): Payer: BC Managed Care – PPO

## 2018-02-23 DIAGNOSIS — R0789 Other chest pain: Secondary | ICD-10-CM | POA: Diagnosis not present

## 2018-02-23 DIAGNOSIS — M5416 Radiculopathy, lumbar region: Secondary | ICD-10-CM

## 2018-02-23 DIAGNOSIS — M545 Low back pain: Secondary | ICD-10-CM

## 2018-02-23 DIAGNOSIS — S2020XA Contusion of thorax, unspecified, initial encounter: Secondary | ICD-10-CM | POA: Diagnosis not present

## 2018-02-23 IMAGING — CR DG LUMBAR SPINE COMPLETE 4+V
5 series · 6 of 6 positions shown · non-contrast
Comparison: None.

CLINICAL DATA: Fall several days ago with persistent low back pain,
initial encounter

EXAM:
LUMBAR SPINE - COMPLETE 4+ VIEW

[l-spine ap]
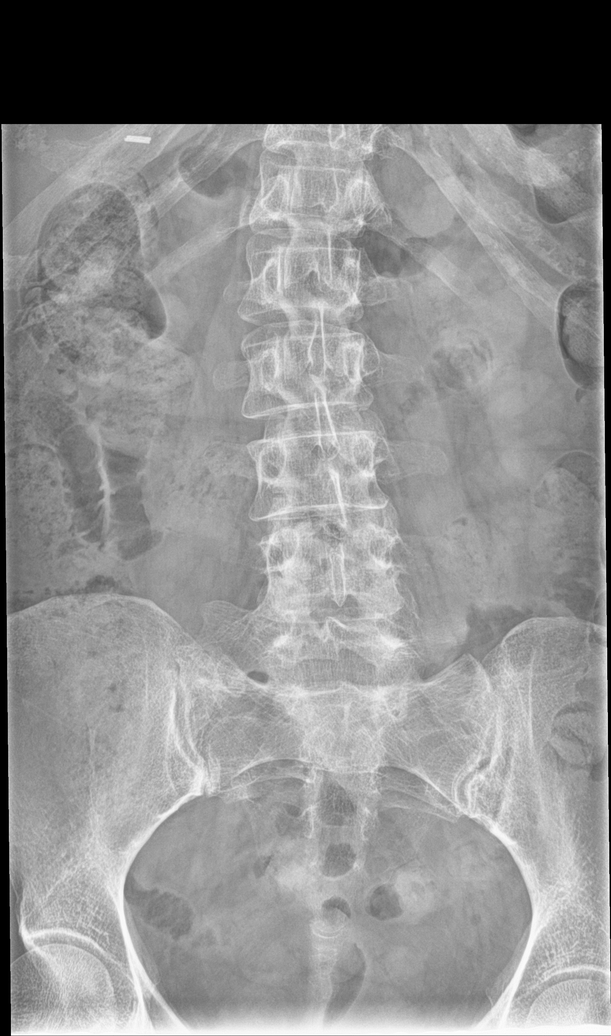

[l-spine obl (1 of 2)]
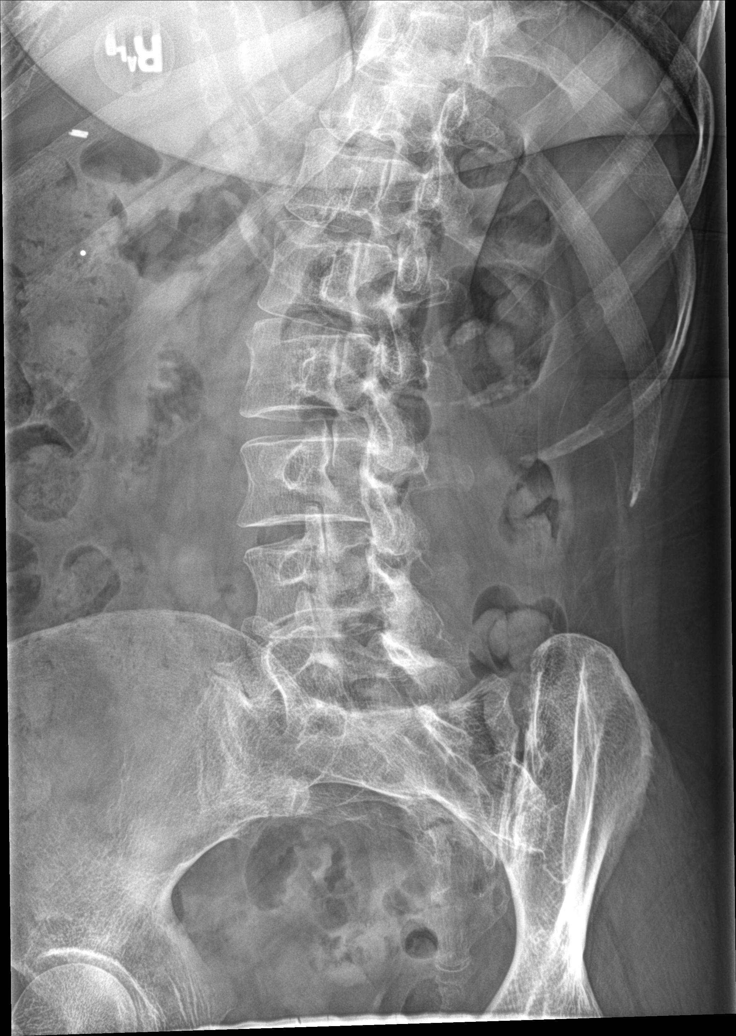

[Series 3: l-spine obl · 0.14mm/px · 2 of 2 slices shown (2 of 2)]
[im 1/2]
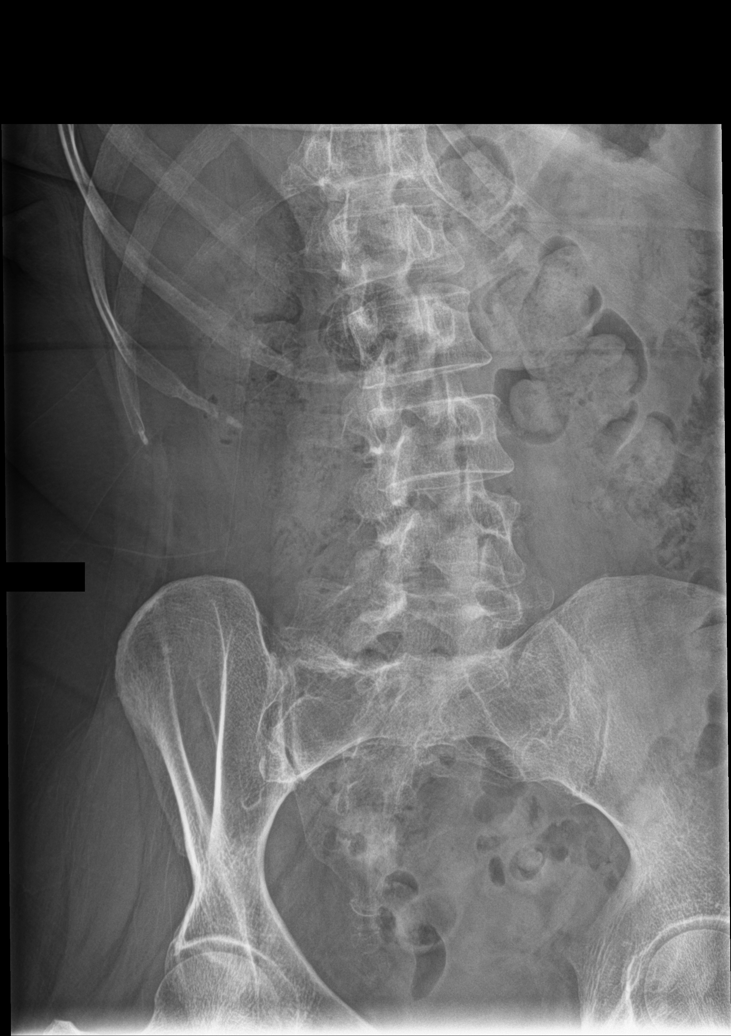
[im 2/2]
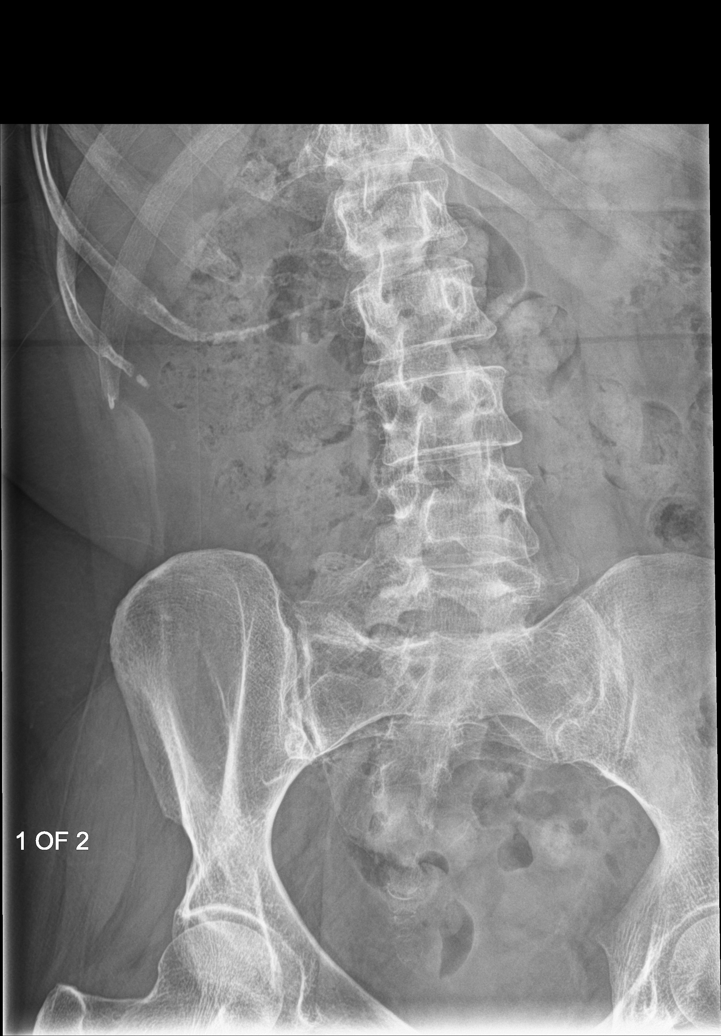

[l-spine lat]
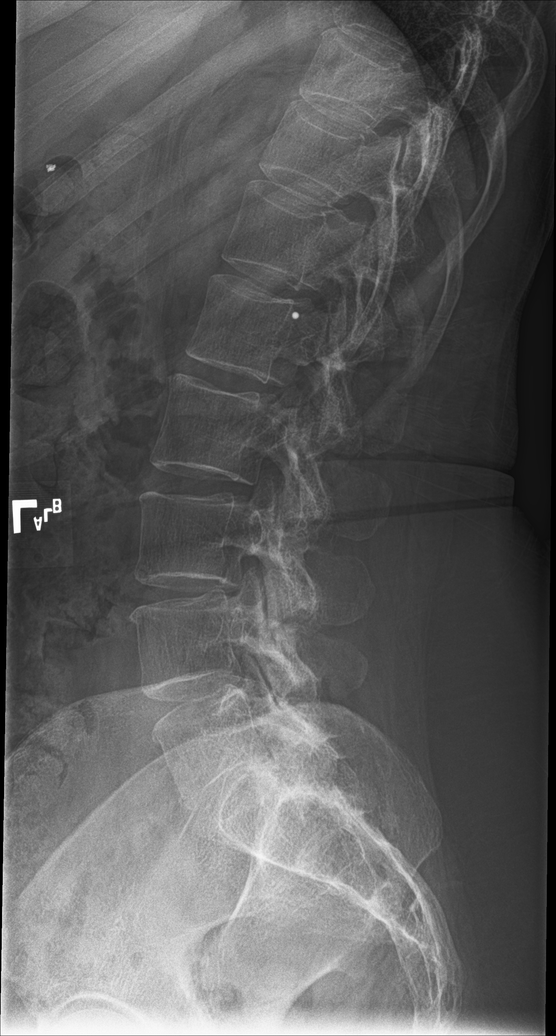

[l-spine spot]
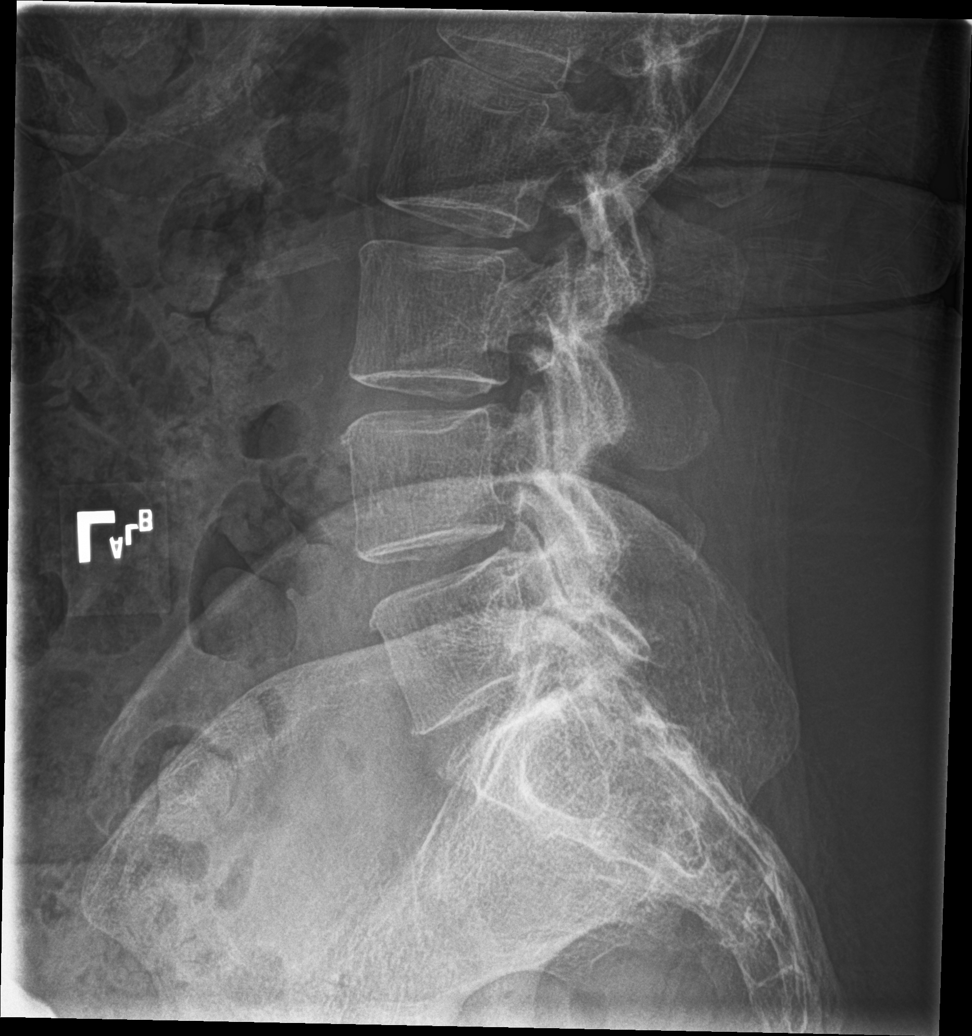

[6 of 6 positions shown; findings below may reference images not displayed]

FINDINGS: Five lumbar type vertebral bodies are well visualized. L5 is
partially sacralized on the right. Vertebral body height is well
maintained. No pars defects are noted. No spondylolisthesis is seen.
Mild osteophytic changes are noted. No acute abnormality is seen.
IMPRESSION: Mild degenerative change without acute abnormality.

## 2018-02-23 IMAGING — CR DG RIBS W/ CHEST 3+V*R*
5 series · 5 of 5 positions shown · non-contrast
Comparison: None.

CLINICAL DATA: Fall 2 weeks ago with persistent chest pain, initial
encounter

EXAM:
RIGHT RIBS AND CHEST - 3+ VIEW

[chest pa]
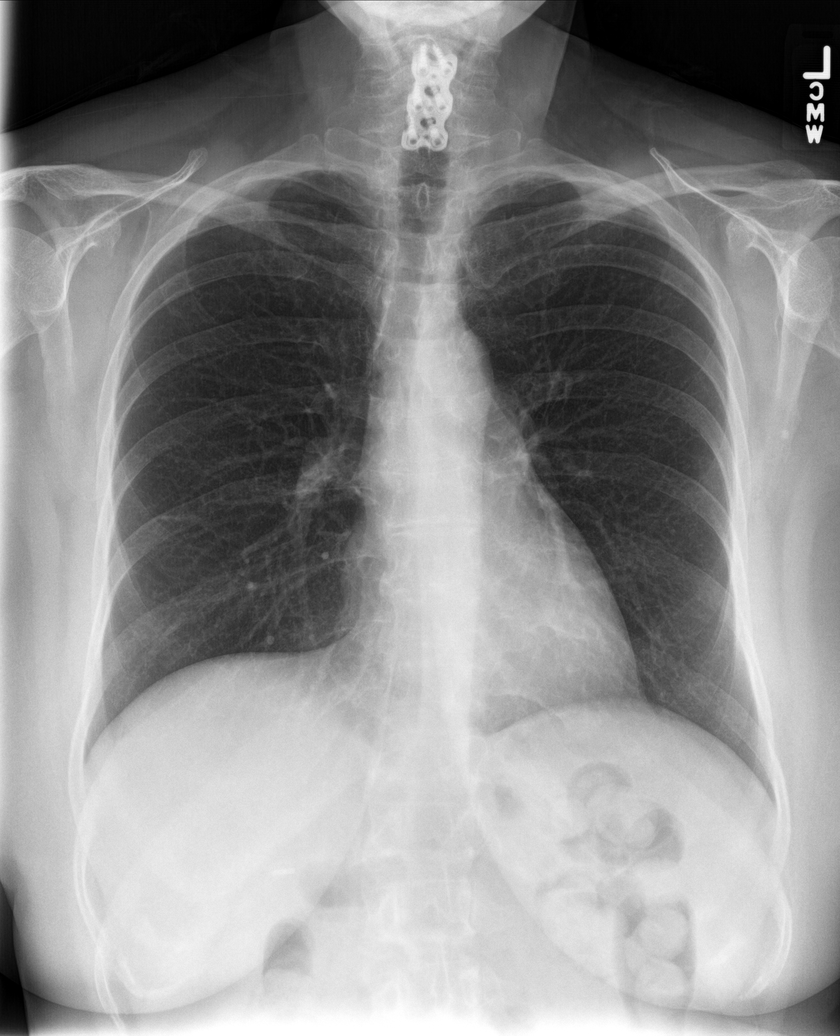

[rib pa (1 of 2)]
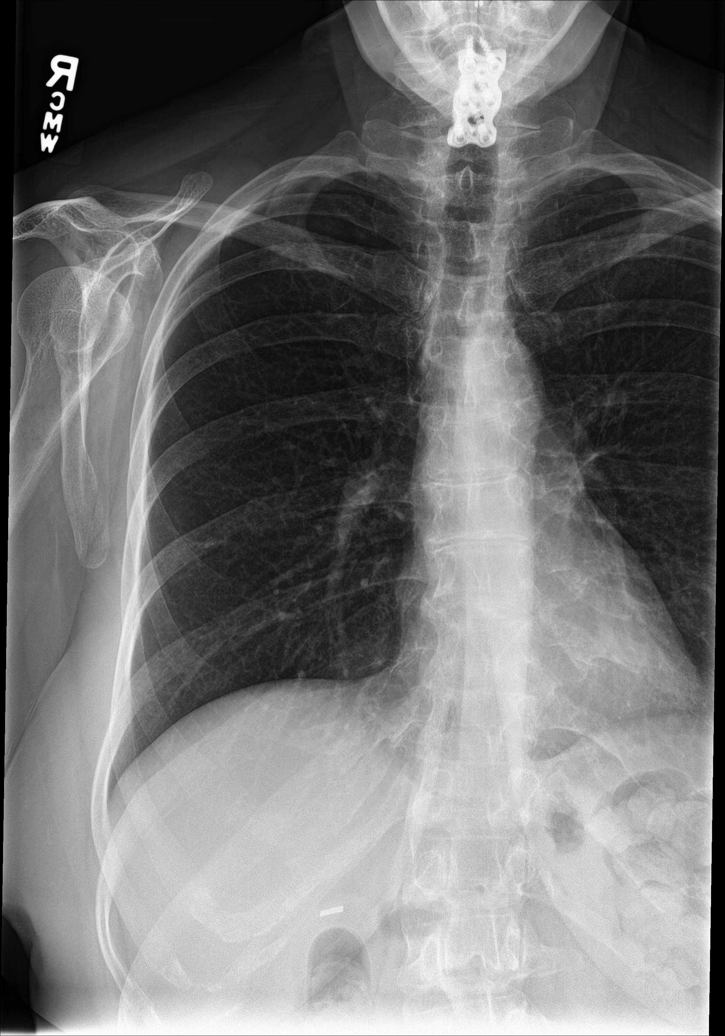

[rib pa (2 of 2)]
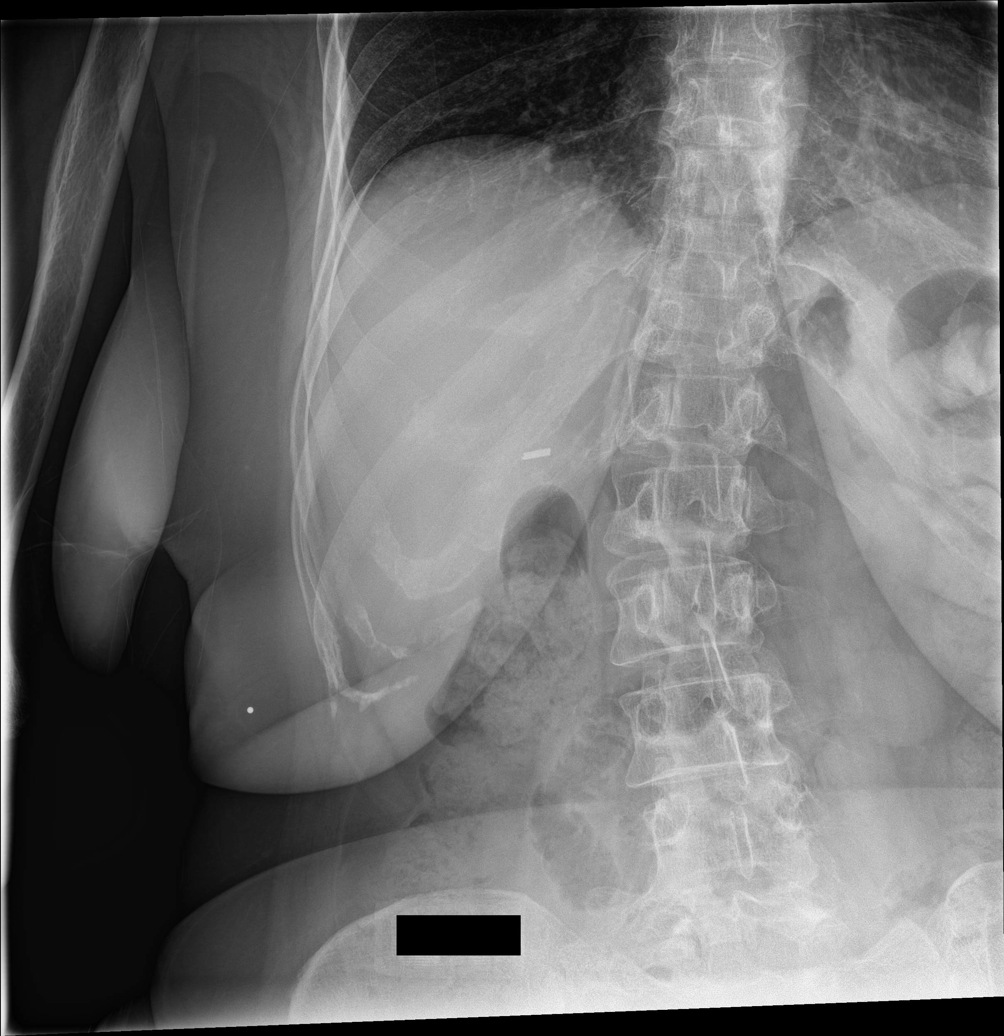

[rib obl (1 of 2)]
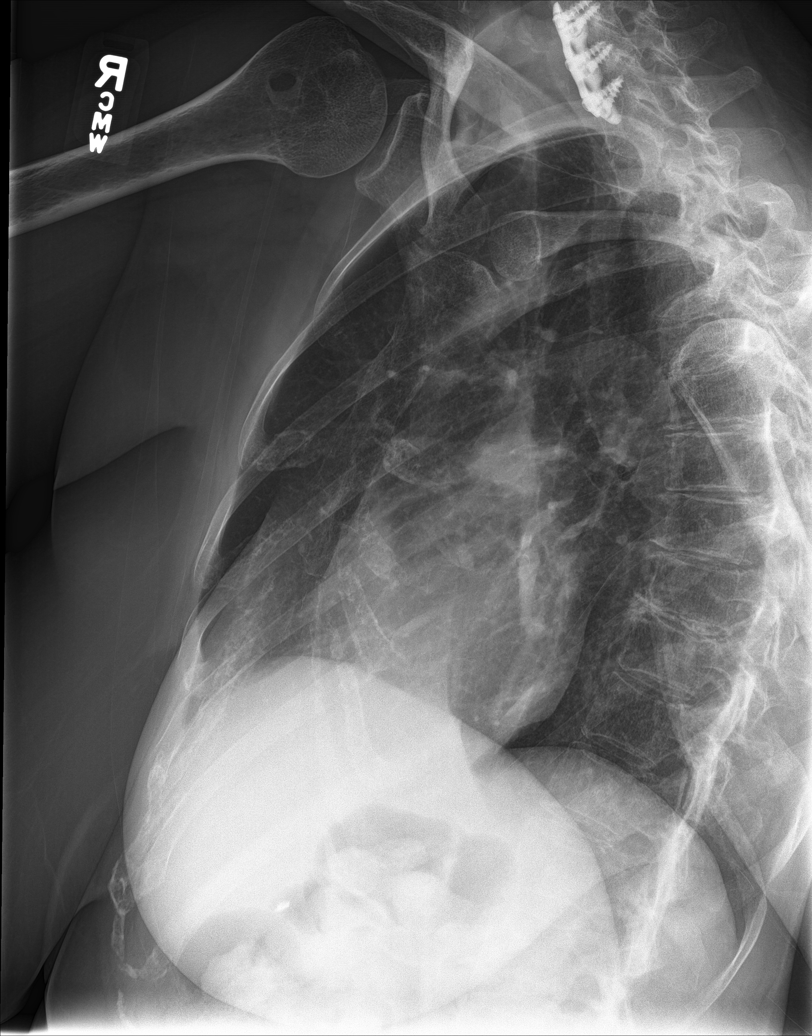

[rib obl (2 of 2)]
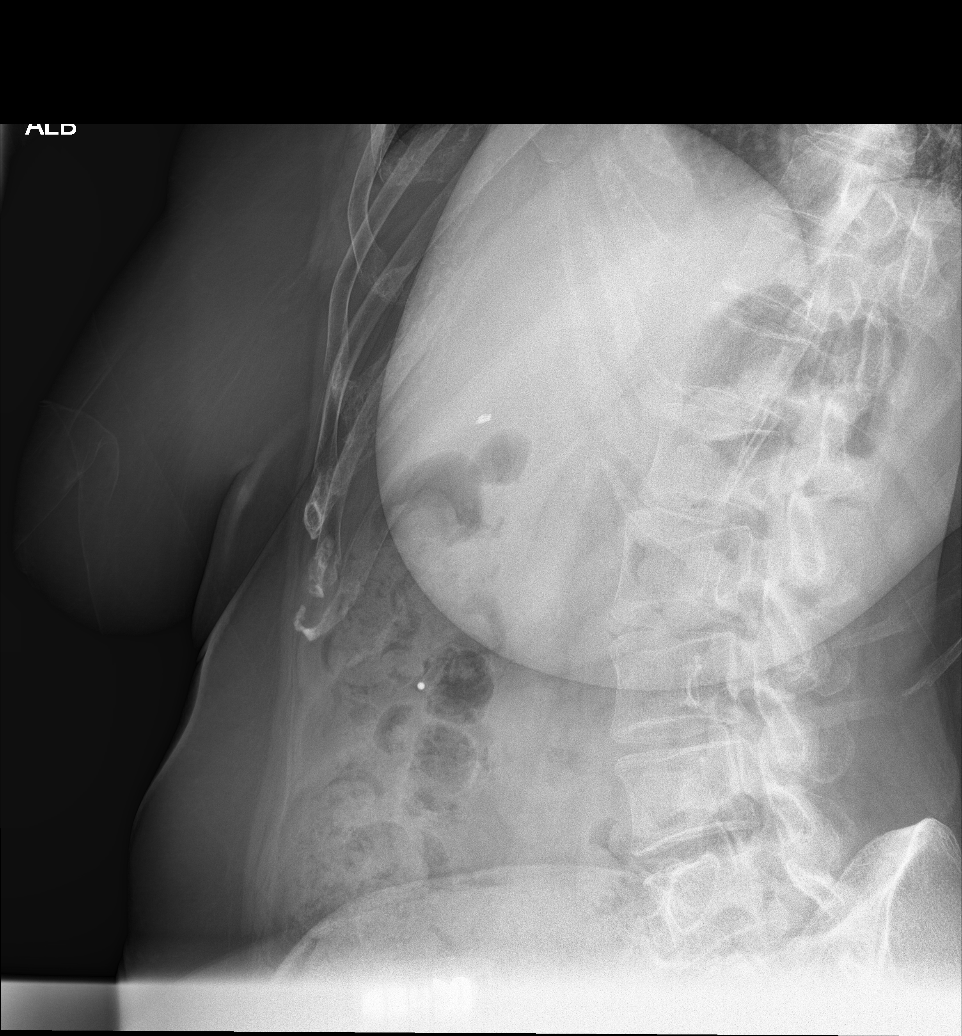

[5 of 5 positions shown; findings below may reference images not displayed]

FINDINGS: Cardiac shadows within normal limits. Postsurgical changes in the
cervical spine are seen. The lungs are clear. No pneumothorax is
noted. No definitive rib fracture is noted.
IMPRESSION: No acute abnormality noted.

## 2018-02-23 MED ORDER — METHYLPREDNISOLONE 4 MG PO TBPK: ORAL_TABLET | ORAL | 0 refills | Status: DC

## 2018-02-23 NOTE — ED Triage Notes (Signed)
Pt reports she fell a few weeks ago "flat on my back" and now has thoracic back pain and down right leg. Pain 8/10

## 2018-02-23 NOTE — Discharge Instructions (Addendum)
Avoid symptoms and rest.  Use heat or ice as necessary for comfort.  Not improving follow-up with your primary care physician

## 2018-02-23 NOTE — ED Provider Notes (Signed)
[facility]    CSN: [id number] Arrival date & time: 02/23/18  4098     History   Chief Complaint Chief Complaint  Patient presents with  . Fall    HPI Lorina Duffner Reitan is a 62 y.o. female.   HPI  Year old female with right sided lower rib pain and low back pain with radiation into her right buttock and posterior thigh all the way to her foot.  She has a history of falling backwards onto her back when she tripped in a parking lot falling backwards landing squarely on her back and hitting her head.  There is no loss of consciousness.  This occurred a few weeks ago.  States over time it seemed to improve until the last couple of days when she noticed onset of the right lower rib pain and also of the right lower lumbar pain with radiation into her leg.  Had no bowel or bladder dysfunction.  He said it does hurt to take a deep inspiration.  When laying on the opposite side she had noticed the onset of the radicular symptoms into her low back.  His previous history of lumbar radiculopathy lumbosacral spondylosis fibromyalgia.  She has had anterior cervical disc ectomy and fusion in the past.  In reviewing more recent history she states that she had a sick dog that eventually died he had been lying on the floor comforting the animal for a few days which may have precipitated both of her symptoms.  This actually may not have been a result of her prior injury         Past Medical History:  Diagnosis Date  . Arthritis   . Family history of ovarian cancer 12/2016   MyRisk neg  . Fibromyalgia   . Migraine   . Neuropathy   . Placenta previa     Patient Active Problem List   Diagnosis Date Noted  . Family history of ovarian cancer 01/11/2017  . Acute bursitis of right shoulder 06/07/2016  . Partial tear of right rotator cuff 06/07/2016  . Lumbar radiculopathy 07/23/2015  . Migraine without aura and without status migrainosus, not intractable 04/23/2015  .  Chronic daily headache 04/23/2015  . DDD (degenerative disc disease), cervical 07/06/2014  . Neck pain 07/06/2014  . Dyspareunia 12/14/2013  . Benign essential hypertension 09/13/2013  . Polyarthralgia 07/20/2013  . Diverticula of colon 01/22/2013  . Fibromyalgia 01/22/2013  . Restless legs syndrome 01/22/2013  . Moderate obstructive sleep apnea 01/22/2013  . Migraine headache 01/22/2013  . Insomnia 01/22/2013  . Generalized anxiety disorder 01/22/2013  . Esophageal reflux 01/22/2013  . Atrophic vaginitis 01/22/2013  . Anemia 01/22/2013  . Allergic rhinitis 01/22/2013  . Urticaria 01/18/2013  . Low back pain 10/03/2012  . Lumbosacral spondylosis 08/17/2012    Past Surgical History:  Procedure Laterality Date  . CERVICAL FUSION    . CESAREAN SECTION    . CHOLECYSTECTOMY    . HERNIA REPAIR    . REPLACEMENT TOTAL KNEE Left   . SHOULDER SURGERY Right     OB History    Gravida  4   Para  3   Term  3   Preterm      AB  1   Living  2     SAB  1   TAB      Ectopic      Multiple      Live Births  3  Home Medications    Prior to Admission medications   Medication Sig Start Date End Date Taking? Authorizing Provider  clobetasol cream (TEMOVATE) 0.05 % Apply topically. 09/07/17  Yes [provider]  hydrochlorothiazide (HYDRODIURIL) 25 MG tablet Take by mouth. 07/27/17 07/22/18 Yes [provider]  methocarbamol (ROBAXIN) 500 MG tablet Take by mouth. 08/22/17  Yes [provider]  methotrexate (RHEUMATREX) 2.5 MG tablet TAKE 6 TABLETS BY MOUTH EVERY 7 DAYS(ONE DAY PER WEEK) 02/08/18  Yes [provider]  aspirin-acetaminophen-caffeine (EXCEDRIN MIGRAINE) [phone] MG tablet Take by mouth.    [provider]  BELBUCA 150 MCG FILM PLACE 1 FILM  INSIDE  CHEEK BID 12/07/17   [provider]  butalbital-acetaminophen-caffeine (FIORICET, ESGIC) 50-325-40 MG tablet Take 1 tablet by mouth every 6 (six) hours  as needed for headache.    [provider]  cyanocobalamin 1000 MCG tablet Take 1,000 mcg by mouth daily.    [provider]  diclofenac sodium (VOLTAREN) 1 % GEL APP TOPICALLY 2 GRAMS AA QID FOR OSTEOARTHRITIS OF KNEES 01/02/18   [provider]  Doxepin HCl 5 % CREA Apply three times a day to itchy areas 07/14/16   [provider]  ERENUMAB-AOOE [place] Inject into the skin. 12/27/16   [provider]  ergocalciferol (VITAMIN D2) 50000 units capsule Take 50,000 Units by mouth once a week.    [provider]  esomeprazole (NEXIUM) 40 MG capsule Take 40 mg by mouth daily at 12 noon.    [provider]  estradiol (ESTRACE) 0.1 MG/GM vaginal cream I 0.5 GRAMS VAGINALLY 2 TIMES A WEEK AT NIGHT UTD 08/09/16   [provider]  fexofenadine (ALLEGRA) 180 MG tablet Take 180 mg by mouth daily.    [provider]  gabapentin (NEURONTIN) 300 MG capsule Take 900 mg by mouth at bedtime.    [provider]  hydrOXYzine (ATARAX/VISTARIL) 10 MG tablet Take 10 mg by mouth every 8 (eight) hours as needed.    [provider]  hyoscyamine (LEVSIN, ANASPAZ) 0.125 MG tablet Take 0.125 mg by mouth every 4 (four) hours as needed.    [provider]  methylPREDNISolone (MEDROL DOSEPAK) 4 MG TBPK tablet Take per package instructions 02/23/18   Lutricia Feil, PA-C  rizatriptan (MAXALT) 10 MG tablet Take 10 mg by mouth as needed for migraine. May repeat in 2 hours if needed    [provider]  topiramate (TOPAMAX) 100 MG tablet Take 200 mg by mouth daily.    [provider]  valACYclovir (VALTREX) 1000 MG tablet Take 1 tablet once a day by mouth 07/14/16   [provider]  zolpidem (AMBIEN CR) 12.5 MG CR tablet Take 12.5 mg by mouth at bedtime as needed for sleep.    [provider]    Family History Family History  Problem Relation Age of Onset  . Ovarian cancer Maternal Aunt 30  .  Uterine cancer Mother        43s  . Uterine cancer Maternal Grandmother        ? age    Social History Social History   Tobacco Use  . Smoking status: Never Smoker  . Smokeless tobacco: Never Used  Substance Use Topics  . Alcohol use: Yes    Comment: social  . Drug use: Never     Allergies   Hydrocodone-acetaminophen; Cymbalta [duloxetine hcl]; Lyrica [pregabalin]; and Morphine and related   Review of Systems Review of Systems  Constitutional:  Positive for activity change. Negative for appetite change, chills, fatigue and fever.  Musculoskeletal: Positive for arthralgias and back pain.  All other systems reviewed and are negative.    Physical Exam Triage Vital Signs ED Triage Vitals  Enc Vitals Group     BP 02/23/18 1005 129/68     Pulse Rate 02/23/18 1005 70     Resp 02/23/18 1005 16     Temp 02/23/18 1005 97.7 F (36.5 C)     Temp Source 02/23/18 1005 Oral     SpO2 02/23/18 1005 100 %     Weight 02/23/18 1004 178 lb (80.7 kg)     Height 02/23/18 1004 5' 2.5" (1.588 m)     Head Circumference --      Peak Flow --      Pain Score 02/23/18 1004 8     Pain Loc --      Pain Edu? --      Excl. in GC? --    No data found.  Updated Vital Signs BP 129/68 (BP Location: Left Arm)   Pulse 70   Temp 97.7 F (36.5 C) (Oral)   Resp 16   Ht 5' 2.5" (1.588 m)   Wt 178 lb (80.7 kg)   SpO2 100%   BMI 32.04 kg/m   Visual Acuity Right Eye Distance:   Left Eye Distance:   Bilateral Distance:    Right Eye Near:   Left Eye Near:    Bilateral Near:     Physical Exam  Constitutional: She is oriented to person, place, and time. She appears well-developed and well-nourished. No distress.  HENT:  Head: Normocephalic.  Eyes: Pupils are equal, round, and reactive to light. Right eye exhibits no discharge. Left eye exhibits no discharge.  Neck: Normal range of motion.  Pulmonary/Chest: Effort normal and breath sounds normal.  Musculoskeletal: She exhibits tenderness.   Examination of the thoracic and lumbar spine was performed with Tamela Oddi, Geologist, engineering.  Patient is a level pelvis in stance.  Lungs are full and equal without rales rhonchi wheezing or crepitus.  Has tenderness sharply localized over the lateral right ribs in the lower #10-11.  Examination of the lumbar spine shows tenderness over the her lumbar segment on the right medial to the sacroiliac joint.  Is able to forward flex with the level of her hands to the ankles.  To stand erect without difficulty.  Drastic rotation to the right is limited and causes her discomfort in the ribs.  The left is much more fluid out discomfort.  She is unable to toe heel walk due to neuropathy".  Neurological: She is alert and oriented to person, place, and time. She displays normal reflexes. No sensory deficit. She exhibits normal muscle tone.  Skin: Skin is warm and dry. She is not diaphoretic.  Psychiatric: She has a normal mood and affect. Her behavior is normal. Judgment and thought content normal.  Nursing note and vitals reviewed.    UC Treatments / Results  Labs (all labs ordered are listed, but only abnormal results are displayed) Labs Reviewed - No data to display  EKG None  Radiology Dg Ribs Unilateral W/chest Right  Result Date: 02/23/2018 CLINICAL DATA:  Fall 2 weeks ago with persistent chest pain, initial encounter EXAM: RIGHT RIBS AND CHEST - 3+ VIEW COMPARISON:  None. FINDINGS: Cardiac shadows within normal limits. Postsurgical changes in the cervical spine are seen. The lungs are clear. No pneumothorax is noted. No definitive rib fracture  is noted. IMPRESSION: No acute abnormality noted. Electronically Signed   By: Alcide Clever M.D.   On: 02/23/2018 12:09   Dg Lumbar Spine Complete  Result Date: 02/23/2018 CLINICAL DATA:  Fall several days ago with persistent low back pain, initial encounter EXAM: LUMBAR SPINE - COMPLETE 4+ VIEW COMPARISON:  None. FINDINGS: Five lumbar type  vertebral bodies are well visualized. L5 is partially sacralized on the right. Vertebral body height is well maintained. No pars defects are noted. No spondylolisthesis is seen. Mild osteophytic changes are noted. No acute abnormality is seen. IMPRESSION: Mild degenerative change without acute abnormality. Electronically Signed   By: Alcide Clever M.D.   On: 02/23/2018 12:10    Procedures Procedures (including critical care time)  Medications Ordered in UC Medications - No data to display  Initial Impression / Assessment and Plan / UC Course  I have reviewed the triage vital signs and the nursing notes.  Pertinent labs & imaging results that were available during my care of the patient were reviewed by me and considered in my medical decision making (see chart for details).     ReViewed x-rays with her.  Is likely that she stained her symptoms from lying on a hard floor for prolonged periods with her sick dog.  Treat this conservatively.  She is not able to tolerate NSAIDs well and so will prescribe a Medrol Dosepak which is worked well for her in the past.  I told her the importance of symptom avoidance and rest as necessary.  If she continues to have problems she should follow-up with her primary care physician Final Clinical Impressions(s) / UC Diagnoses   Final diagnoses:  Contusion of thoracic wall, unspecified area of thoracic wall, initial encounter  Radiculopathy of lumbar region     Discharge Instructions     Avoid symptoms and rest.  Use heat or ice as necessary for comfort.  Not improving follow-up with your primary care physician    ED Prescriptions    Medication Sig Dispense Auth. Provider   methylPREDNISolone (MEDROL DOSEPAK) 4 MG TBPK tablet Take per package instructions 21 tablet Lutricia Feil, PA-C     Controlled Substance Prescriptions [place] Controlled Substance Registry consulted? Not Applicable   Lutricia Feil, PA-C 02/23/18 1349

## 2018-07-04 ENCOUNTER — Ambulatory Visit
Admission: EM | Admit: 2018-07-04 | Discharge: 2018-07-04 | Disposition: A | Discharge status: Home / Self Care | Payer: Medicare Other | Attending: Family Medicine | Admitting: Family Medicine

## 2018-07-04 ENCOUNTER — Encounter: Payer: Self-pay | Admitting: Emergency Medicine

## 2018-07-04 ENCOUNTER — Other Ambulatory Visit: Payer: Self-pay

## 2018-07-04 DIAGNOSIS — J069 Acute upper respiratory infection, unspecified: Secondary | ICD-10-CM

## 2018-07-04 LAB — RAPID STREP SCREEN (MED CTR MEBANE ONLY): Streptococcus, Group A Screen (Direct): NEGATIVE

## 2018-07-04 MED ORDER — MELOXICAM 7.5 MG PO TABS: 7.5000 mg | ORAL_TABLET | Freq: Every day | ORAL | 0 refills | Status: DC

## 2018-07-04 NOTE — ED Provider Notes (Addendum)
[facility]    CSN: [id number] Arrival date & time: 07/04/18  2423     History   Chief Complaint Chief Complaint  Patient presents with  . Sore Throat  . Neck Pain    HPI Amy Huynh is a 63 y.o. female.   HPI  -year-old female presents with C/O of sore throat fatigue headache neck stiffness and pain in her lymph nodes indicating inferior to her jaw started having yesterday.  She has had no fever.  She tried over-the-counter Excedrin which really has not helped very much. Vital signs today are normal.  Does not appear toxic or  ill.       Past Medical History:  Diagnosis Date  . Arthritis   . Family history of ovarian cancer 12/2016   MyRisk neg  . Fibromyalgia   . Migraine   . Neuropathy   . Placenta previa     Patient Active Problem List   Diagnosis Date Noted  . Family history of ovarian cancer 01/11/2017  . Acute bursitis of right shoulder 06/07/2016  . Partial tear of right rotator cuff 06/07/2016  . Lumbar radiculopathy 07/23/2015  . Migraine without aura and without status migrainosus, not intractable 04/23/2015  . Chronic daily headache 04/23/2015  . DDD (degenerative disc disease), cervical 07/06/2014  . Neck pain 07/06/2014  . Dyspareunia 12/14/2013  . Benign essential hypertension 09/13/2013  . Polyarthralgia 07/20/2013  . Diverticula of colon 01/22/2013  . Fibromyalgia 01/22/2013  . Restless legs syndrome 01/22/2013  . Moderate obstructive sleep apnea 01/22/2013  . Migraine headache 01/22/2013  . Insomnia 01/22/2013  . Generalized anxiety disorder 01/22/2013  . Esophageal reflux 01/22/2013  . Atrophic vaginitis 01/22/2013  . Anemia 01/22/2013  . Allergic rhinitis 01/22/2013  . Urticaria 01/18/2013  . Low back pain 10/03/2012  . Lumbosacral spondylosis 08/17/2012    Past Surgical History:  Procedure Laterality Date  . CERVICAL FUSION    . CESAREAN SECTION    . CHOLECYSTECTOMY    . HERNIA REPAIR    .  REPLACEMENT TOTAL KNEE Left   . SHOULDER SURGERY Right     OB History    Gravida  4   Para  3   Term  3   Preterm      AB  1   Living  2     SAB  1   TAB      Ectopic      Multiple      Live Births  3            Home Medications    Prior to Admission medications   Medication Sig Start Date End Date Taking? Authorizing Provider  aspirin-acetaminophen-caffeine (EXCEDRIN MIGRAINE) [phone] MG tablet Take by mouth.   Yes [provider]  buprenorphine (BUTRANS) 5 MCG/HR PTWK Place onto the skin. 06/23/18 07/23/18 Yes [provider]  butalbital-acetaminophen-caffeine (FIORICET, ESGIC) 50-325-40 MG tablet Take 1 tablet by mouth every 6 (six) hours as needed for headache.   Yes [provider]  clobetasol cream (TEMOVATE) 0.05 % Apply topically. 09/07/17  Yes [provider]  cyanocobalamin 1000 MCG tablet Take 1,000 mcg by mouth daily.   Yes [provider]  diclofenac sodium (VOLTAREN) 1 % GEL APP TOPICALLY 2 GRAMS AA QID FOR OSTEOARTHRITIS OF KNEES 01/02/18  Yes [provider]  Doxepin HCl 5 % CREA Apply three times a day to itchy areas 07/14/16  Yes [provider]  ERENUMAB-AOOE [place] Inject into the skin.  12/27/16  Yes [provider]  esomeprazole (NEXIUM) 40 MG capsule Take 40 mg by mouth daily at 12 noon.   Yes [provider]  estradiol (ESTRACE) 0.1 MG/GM vaginal cream I 0.5 GRAMS VAGINALLY 2 TIMES A WEEK AT NIGHT UTD 08/09/16  Yes [provider]  fexofenadine (ALLEGRA) 180 MG tablet Take 180 mg by mouth daily.   Yes [provider]  gabapentin (NEURONTIN) 300 MG capsule Take 900 mg by mouth at bedtime.   Yes [provider]  hydrochlorothiazide (HYDRODIURIL) 25 MG tablet Take by mouth. 07/27/17 07/22/18 Yes [provider]  hydrOXYzine (ATARAX/VISTARIL) 10 MG tablet Take 10 mg by mouth every 8 (eight) hours as needed.   Yes [provider]    hyoscyamine (LEVSIN, ANASPAZ) 0.125 MG tablet Take 0.125 mg by mouth every 4 (four) hours as needed.   Yes [provider]  methocarbamol (ROBAXIN) 500 MG tablet Take by mouth. 08/22/17  Yes [provider]  methotrexate (RHEUMATREX) 2.5 MG tablet TAKE 6 TABLETS BY MOUTH EVERY 7 DAYS(ONE DAY PER WEEK) 02/08/18  Yes [provider]  rizatriptan (MAXALT) 10 MG tablet Take 10 mg by mouth as needed for migraine. May repeat in 2 hours if needed   Yes [provider]  topiramate (TOPAMAX) 100 MG tablet Take 200 mg by mouth daily.   Yes [provider]  valACYclovir (VALTREX) 1000 MG tablet Take 1 tablet once a day by mouth 07/14/16  Yes [provider]  VITAMIN D, CHOLECALCIFEROL, PO Take by mouth.   Yes [provider]  meloxicam (MOBIC) 7.5 MG tablet Take 1 tablet (7.5 mg total) by mouth daily. Take with food 07/04/18   Lutricia Feil, PA-C    Family History Family History  Problem Relation Age of Onset  . Ovarian cancer Maternal Aunt 30  . Uterine cancer Mother        14s  . COPD Mother   . Hypertension Mother   . Uterine cancer Maternal Grandmother        ? age  . Heart attack Father 81  . Other Father        DDD    Social History Social History   Tobacco Use  . Smoking status: Never Smoker  . Smokeless tobacco: Never Used  Substance Use Topics  . Alcohol use: Yes    Comment: social  . Drug use: Never     Allergies   Hydrocodone-acetaminophen; Cymbalta [duloxetine hcl]; Lyrica [pregabalin]; and Morphine and related   Review of Systems Review of Systems  Constitutional: Positive for activity change and fatigue. Negative for chills, diaphoresis and fever.  HENT: Positive for congestion, sinus pressure, sinus pain and sore throat.   Respiratory: Negative for cough.   All other systems reviewed and are negative.    Physical Exam Triage Vital Signs ED Triage Vitals  Enc Vitals Group     BP 07/04/18 0946  129/83     Pulse Rate 07/04/18 0946 64     Resp 07/04/18 0946 16     Temp 07/04/18 0946 98.3 F (36.8 C)     Temp Source 07/04/18 0946 Oral     SpO2 07/04/18 0946 100 %     Weight 07/04/18 0947 180 lb (81.6 kg)     Height 07/04/18 0947 5' 2.5" (1.588 m)     Head Circumference --      Peak Flow --      Pain Score 07/04/18 0946 8  Pain Loc --      Pain Edu? --      Excl. in GC? --    No data found.  Updated Vital Signs BP 129/83 (BP Location: Left Arm)   Pulse 64   Temp 98.3 F (36.8 C) (Oral)   Resp 16   Ht 5' 2.5" (1.588 m)   Wt 180 lb (81.6 kg)   SpO2 100%   BMI 32.40 kg/m   Visual Acuity Right Eye Distance:   Left Eye Distance:   Bilateral Distance:    Right Eye Near:   Left Eye Near:    Bilateral Near:     Physical Exam Vitals signs and nursing note reviewed.  Constitutional:      General: She is not in acute distress.    Appearance: She is well-developed. She is not ill-appearing, toxic-appearing or diaphoretic.  HENT:     Head: Normocephalic and atraumatic.     Right Ear: Tympanic membrane and ear canal normal.     Left Ear: Tympanic membrane and ear canal normal.     Nose: No congestion or rhinorrhea.     Mouth/Throat:     Mouth: Mucous membranes are moist. No oral lesions.     Pharynx: No pharyngeal swelling, oropharyngeal exudate, posterior oropharyngeal erythema or uvula swelling.     Tonsils: No tonsillar exudate or tonsillar abscesses. Swelling: 0 on the right. 0 on the left.  Eyes:     Conjunctiva/sclera: Conjunctivae normal.  Neck:     Musculoskeletal: Normal range of motion and neck supple.  Pulmonary:     Effort: Pulmonary effort is normal.     Breath sounds: Normal breath sounds.  Lymphadenopathy:     Cervical: Cervical adenopathy present.  Skin:    General: Skin is warm and dry.  Neurological:     General: No focal deficit present.     Mental Status: She is alert and oriented to person, place, and time.  Psychiatric:         Behavior: Behavior normal.      UC Treatments / Results  Labs (all labs ordered are listed, but only abnormal results are displayed) Labs Reviewed  RAPID STREP SCREEN ([facility] ONLY)  CULTURE, GROUP A STREP [facility])    EKG None  Radiology No results found.  Procedures Procedures (including critical care time)  Medications Ordered in UC Medications - No data to display  Initial Impression / Assessment and Plan / UC Course  I have reviewed the triage vital signs and the nursing notes.  Pertinent labs & imaging results that were available during my care of the patient were reviewed by me and considered in my medical decision making (see chart for details).   Patient this is likely a viral illness.  Her exam today is very reassuring.  Treat her symptomatically for her headache and stiff neck I will provide her with the meloxicam 7-1/2 mg.  This can interfere with her methotrexate I; told her to limit its use.  Have also limited her prescription amount to 14.  Need to continue with the fluid intake.  Rest adequately.  She has already using a Nettie pot should continue with that.  She is unable to use Flonase nasal spray due to the drying effect with her Sjgren. He is not improving I have recommended she follow-up with her primary care physician for further evaluation  Final Clinical Impressions(s) / UC Diagnoses   Final diagnoses:  Upper respiratory tract infection, unspecified type  Discharge Instructions   None    ED Prescriptions    Medication Sig Dispense Auth. Provider   meloxicam (MOBIC) 7.5 MG tablet Take 1 tablet (7.5 mg total) by mouth daily. Take with food 14 tablet Lutricia Feil, PA-C     Controlled Substance Prescriptions [place] Controlled Substance Registry consulted? Not Applicable   Lutricia Feil, PA-C 07/04/18 1036    Lutricia Feil, PA-C 07/04/18 1037

## 2018-07-04 NOTE — ED Triage Notes (Signed)
Patient in today c/o sore throat, fatigue, headache, neck stiffness and pain in her lymph nodes since yesterday. Patient denies fever. Patient has tried OTC Excedrin.

## 2018-07-07 LAB — CULTURE, GROUP A STREP (THRC)

## 2018-12-01 DIAGNOSIS — Z85828 Personal history of other malignant neoplasm of skin: Secondary | ICD-10-CM | POA: Insufficient documentation

## 2019-01-27 ENCOUNTER — Emergency Department: Payer: Medicare Other

## 2019-01-27 ENCOUNTER — Encounter: Payer: Self-pay | Admitting: Emergency Medicine

## 2019-01-27 ENCOUNTER — Other Ambulatory Visit: Payer: Self-pay

## 2019-01-27 ENCOUNTER — Ambulatory Visit (INDEPENDENT_AMBULATORY_CARE_PROVIDER_SITE_OTHER)
Admission: EM | Admit: 2019-01-27 | Discharge: 2019-01-27 | Disposition: A | Discharge status: Home / Self Care | Payer: Medicare Other | Source: Home / Self Care | Attending: Emergency Medicine | Admitting: Emergency Medicine

## 2019-01-27 ENCOUNTER — Emergency Department
Admission: EM | Admit: 2019-01-27 | Discharge: 2019-01-27 | Disposition: A | Discharge status: Home / Self Care | Payer: Medicare Other | Attending: Student | Admitting: Student

## 2019-01-27 DIAGNOSIS — R3129 Other microscopic hematuria: Secondary | ICD-10-CM

## 2019-01-27 DIAGNOSIS — M549 Dorsalgia, unspecified: Secondary | ICD-10-CM | POA: Insufficient documentation

## 2019-01-27 DIAGNOSIS — R109 Unspecified abdominal pain: Secondary | ICD-10-CM

## 2019-01-27 DIAGNOSIS — Z79899 Other long term (current) drug therapy: Secondary | ICD-10-CM | POA: Insufficient documentation

## 2019-01-27 LAB — URINALYSIS, COMPLETE (UACMP) WITH MICROSCOPIC
Bacteria, UA: NONE SEEN
Bacteria, UA: NONE SEEN
Bilirubin Urine: NEGATIVE
Bilirubin Urine: NEGATIVE
Glucose, UA: NEGATIVE mg/dL
Glucose, UA: NEGATIVE mg/dL
Ketones, ur: NEGATIVE mg/dL
Ketones, ur: NEGATIVE mg/dL
Leukocytes,Ua: NEGATIVE
Nitrite: NEGATIVE
Nitrite: NEGATIVE
Protein, ur: NEGATIVE mg/dL
Protein, ur: NEGATIVE mg/dL
Specific Gravity, Urine: 1.025 (ref 1.005–1.030)
Specific Gravity, Urine: 1.016 (ref 1.005–1.030)
WBC, UA: NONE SEEN WBC/hpf (ref 0–5)
pH: 6.5 (ref 5.0–8.0)
pH: 5 (ref 5.0–8.0)

## 2019-01-27 LAB — BASIC METABOLIC PANEL
Anion gap: 12 (ref 5–15)
BUN: 20 mg/dL (ref 8–23)
CO2: 26 mmol/L (ref 22–32)
Calcium: 9.5 mg/dL (ref 8.9–10.3)
Chloride: 95 mmol/L — ABNORMAL LOW (ref 98–111)
Creatinine, Ser: 0.98 mg/dL (ref 0.44–1.00)
GFR calc Af Amer: >60 mL/min (ref >60)
GFR calc non Af Amer: >60 mL/min (ref >60)
Glucose, Bld: 107 mg/dL — ABNORMAL HIGH (ref 70–99)
Potassium: 3.3 mmol/L — ABNORMAL LOW (ref 3.5–5.1)
Sodium: 133 mmol/L — ABNORMAL LOW (ref 135–145)

## 2019-01-27 LAB — HEPATIC FUNCTION PANEL
ALT: 28 U/L (ref 0–44)
AST: 38 U/L (ref 15–41)
Albumin: 4.1 g/dL (ref 3.5–5.0)
Alkaline Phosphatase: 74 U/L (ref 38–126)
Bilirubin, Direct: <0.1 mg/dL (ref 0.0–0.2)
Total Bilirubin: 0.4 mg/dL (ref 0.3–1.2)
Total Protein: 7.1 g/dL (ref 6.5–8.1)

## 2019-01-27 LAB — CBC
HCT: 39.1 % (ref 36.0–46.0)
Hemoglobin: 12.9 g/dL (ref 12.0–15.0)
MCH: 28 pg (ref 26.0–34.0)
MCHC: 33 g/dL (ref 30.0–36.0)
MCV: 84.8 fL (ref 80.0–100.0)
Platelets: 289 10*3/uL (ref 150–400)
RBC: 4.61 MIL/uL (ref 3.87–5.11)
RDW: 15.9 % — ABNORMAL HIGH (ref 11.5–15.5)
WBC: 9.2 10*3/uL (ref 4.0–10.5)
nRBC: 0 % (ref 0.0–0.2)

## 2019-01-27 LAB — LIPASE, BLOOD: Lipase: 34 U/L (ref 11–51)

## 2019-01-27 IMAGING — CT CT RENAL STONE PROTOCOL
2 of 4 series · 16 of 46 positions shown, 18 images · non-contrast
Comparison: [DATE]

CLINICAL DATA: Right flank pain.

EXAM:
CT ABDOMEN AND PELVIS WITHOUT CONTRAST
TECHNIQUE: Multidetector CT imaging of the abdomen and pelvis was performed
following the standard protocol without IV contrast.

[Series 2: stone full standard · axial · 0.69mm/px · z∈[-441,-21]mm · 13 of 92 slices shown, 15 images]
[im 4/92  soft-tissue]
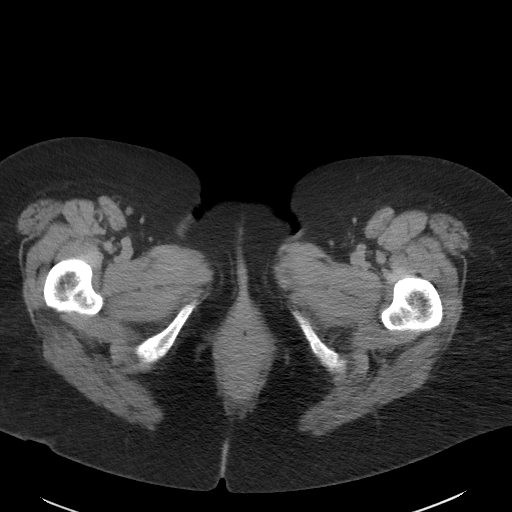
[im 4/92  bone]
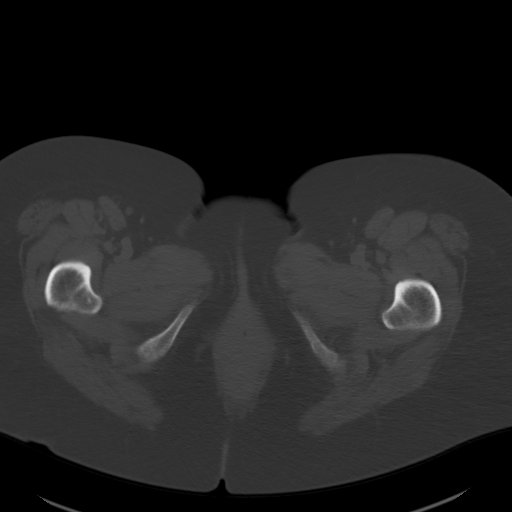
[im 11/92  soft-tissue]
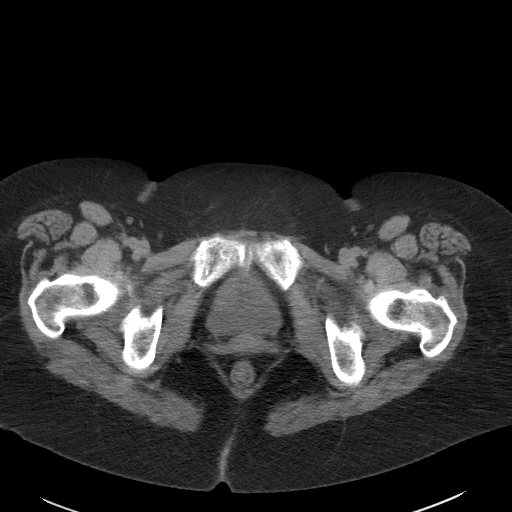
[im 19/92  soft-tissue]
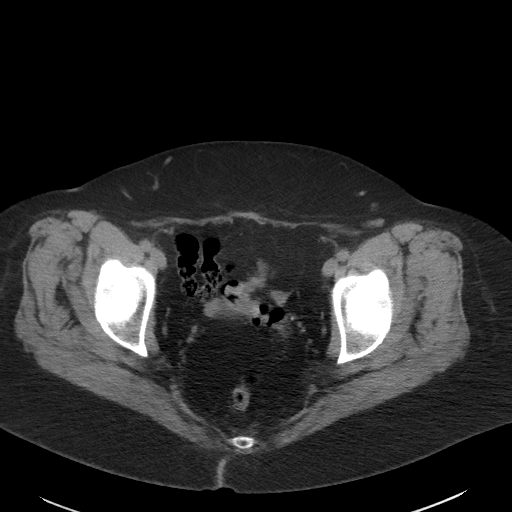
[im 26/92  soft-tissue]
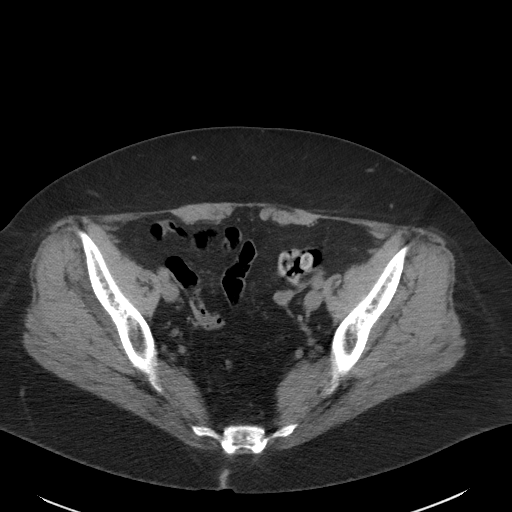
[im 33/92  soft-tissue]
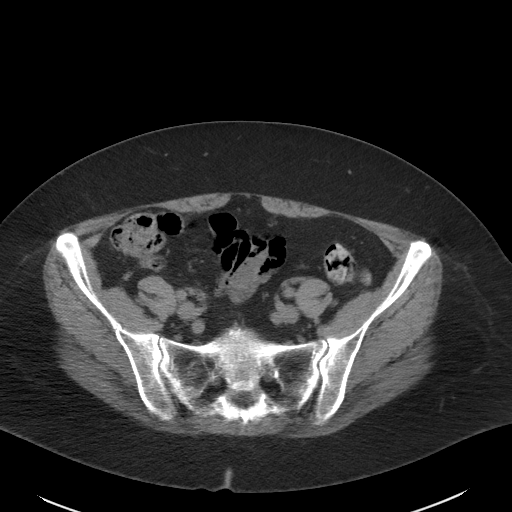
[im 41/92  soft-tissue]
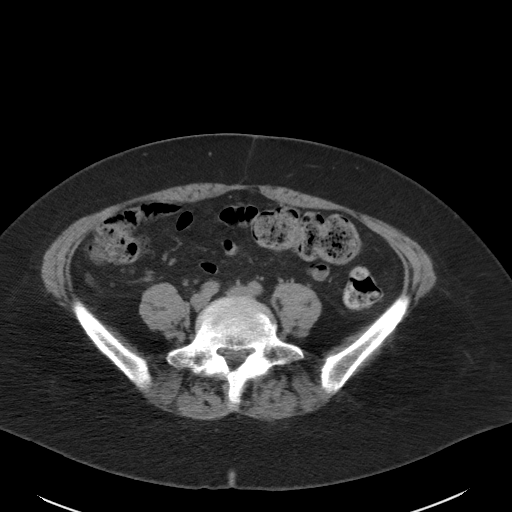
[im 48/92  soft-tissue]
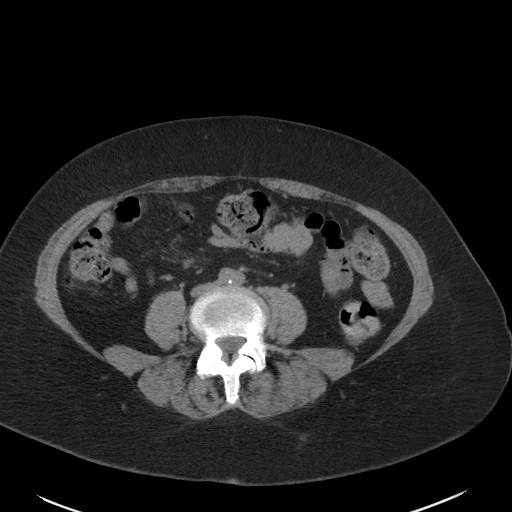
[im 51/92  soft-tissue]
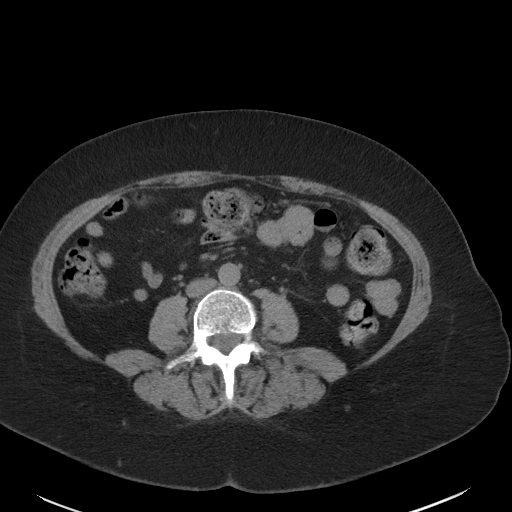
[im 59/92  soft-tissue]
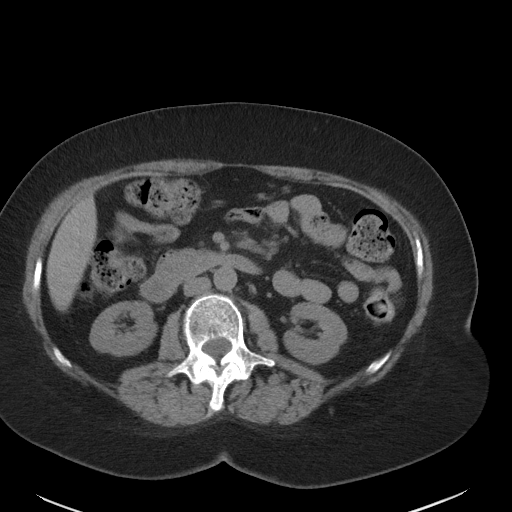
[im 59/92  bone]
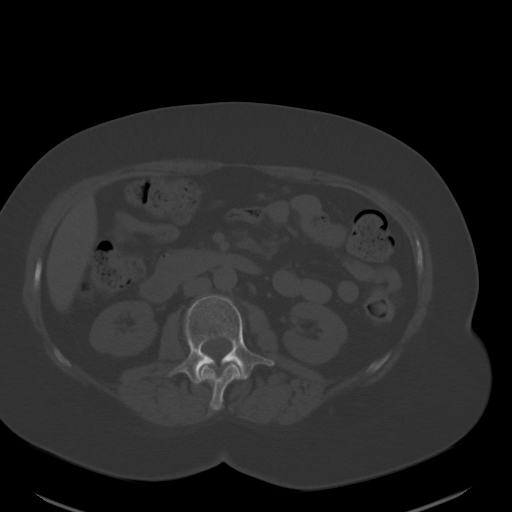
[im 66/92  soft-tissue]
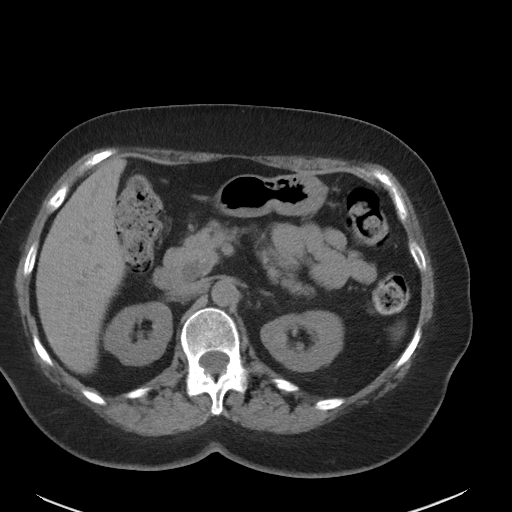
[im 73/92  soft-tissue]
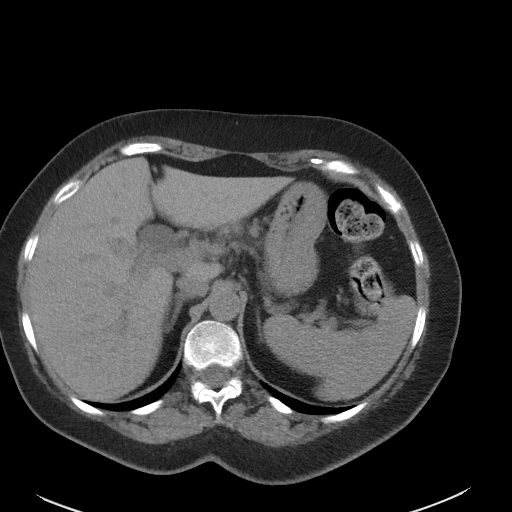
[im 81/92  soft-tissue]
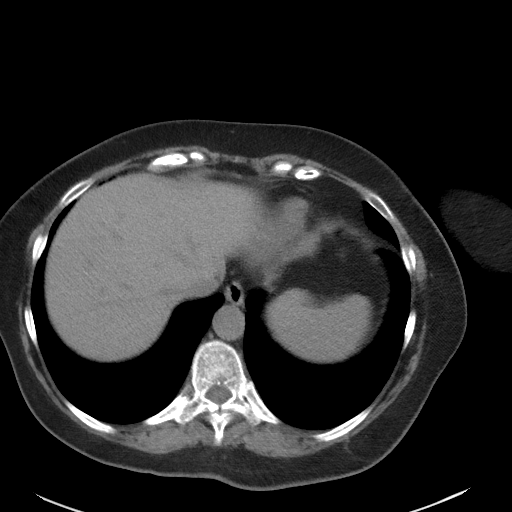
[im 88/92  soft-tissue]
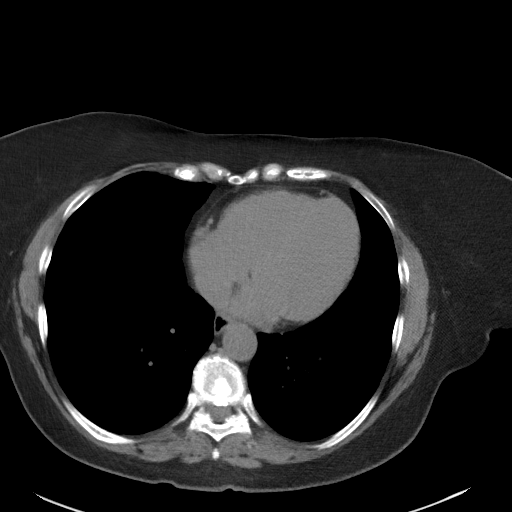

[Series 5: coronal · coronal · 0.73mm/px · 3 of 126 slices shown]
[im 42/126  soft-tissue]
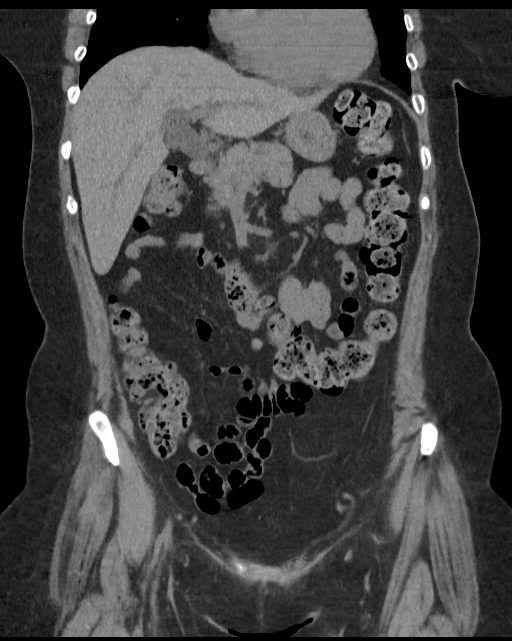
[im 56/126  soft-tissue]
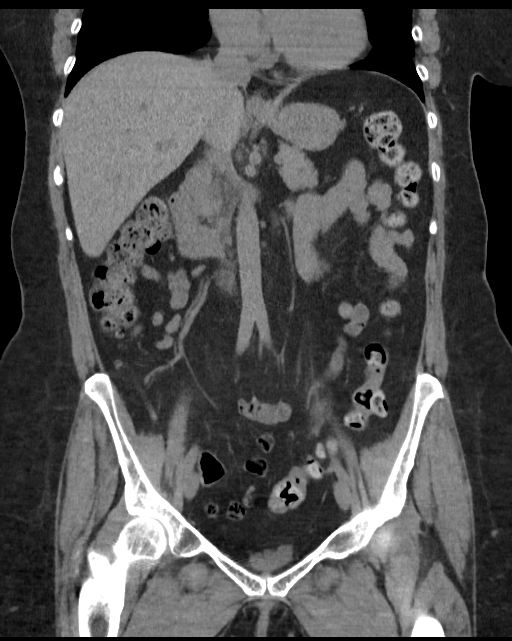
[im 70/126  soft-tissue]
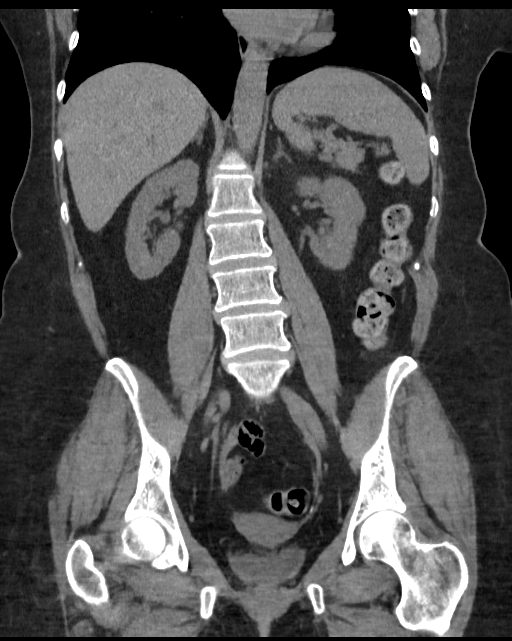

[16 of 46 positions shown; findings below may reference images not displayed]

FINDINGS: Lower chest: Unremarkable

Hepatobiliary: No focal abnormality in the liver on this study
without intravenous contrast. Gallbladder is surgically absent.
Similar distention of the extrahepatic common duct measuring 13 mm
and likely related to prior cholecystectomy.

Pancreas: No focal mass lesion. No dilatation of the main duct. No
intraparenchymal cyst. No peripancreatic edema.

Spleen: No splenomegaly. No focal mass lesion.

Adrenals/Urinary Tract: No adrenal nodule or mass. No gross renal
mass evident on this noncontrast study. No evidence for renal or
ureteral stones. No secondary changes in either kidney or ureter.
Bladder is decompressed without evidence for stone disease.

Stomach/Bowel: Stomach is unremarkable. No gastric wall thickening.
No evidence of outlet obstruction. Duodenum is normally positioned
as is the ligament of Treitz. No small bowel wall thickening. No
small bowel dilatation. The terminal ileum is normal. The appendix
is normal. No gross colonic mass. No colonic wall thickening.
Diverticular changes are noted in the left colon without evidence of
diverticulitis.

Vascular/Lymphatic: No abdominal aortic aneurysm.

Reproductive: There is no gastrohepatic or hepatoduodenal ligament
lymphadenopathy. No intraperitoneal or retroperitoneal
lymphadenopathy. No pelvic sidewall lymphadenopathy.

Other: No intraperitoneal free fluid.

Musculoskeletal: No worrisome lytic or sclerotic osseous
abnormality.
IMPRESSION: 1. No findings to explain the patient's history of right flank pain.
2. Stable distention of the extrahepatic common duct at 13 mm. This
is probably related to the prior cholecystectomy, but correlation
with liver function test recommended.

## 2019-01-27 MED ORDER — SODIUM CHLORIDE 0.9 % IV BOLUS
1000.0000 mL | Freq: Once | INTRAVENOUS | Status: AC
  Administered 2019-01-27: 1000 mL via INTRAVENOUS

## 2019-01-27 MED ORDER — FENTANYL CITRATE (PF) 100 MCG/2ML IJ SOLN
50.0000 ug | Freq: Once | INTRAMUSCULAR | Status: AC
  Administered 2019-01-27: 50 ug via INTRAVENOUS
  Filled 2019-01-27: qty 2

## 2019-01-27 MED ORDER — ONDANSETRON 8 MG PO TBDP
8.0000 mg | ORAL_TABLET | Freq: Once | ORAL | Status: AC
  Administered 2019-01-27: 8 mg via ORAL

## 2019-01-27 MED ORDER — LIDOCAINE 5 % EX PTCH: 1.0000 | MEDICATED_PATCH | Freq: Two times a day (BID) | CUTANEOUS | 0 refills | Status: AC

## 2019-01-27 MED ORDER — KETOROLAC TROMETHAMINE 60 MG/2ML IM SOLN
60.0000 mg | Freq: Once | INTRAMUSCULAR | Status: AC
  Administered 2019-01-27: 60 mg via INTRAMUSCULAR

## 2019-01-27 MED ORDER — DICLOFENAC SODIUM 1 % TD GEL: 2.0000 g | Freq: Four times a day (QID) | TRANSDERMAL | 0 refills | Status: AC

## 2019-01-27 NOTE — ED Triage Notes (Signed)
Patient c/o right flank pain that started on Wednesday but got worse last night.  Patient reports nausea.  Patient took some Zofran this morning at home.  Patient denies V/D.  Patient denies any pain urinary pain or frequency. Patient denies any injury or fall.

## 2019-01-27 NOTE — ED Provider Notes (Signed)
[facility] - [place], [place]   Name: Amy Huynh DOB: 02-03-1956 MRN: [id number] CSN: [id number] PCP: Verdie Shire, MD  Arrival date and time:  01/27/19 0956  Chief Complaint:  Flank Pain (right)   NOTE: Prior to seeing the patient today, I have reviewed the triage nursing documentation and vital signs. Clinical staff has updated patient's PMH/PSHx, current medication list, and drug allergies/intolerances to ensure comprehensive history available to assist in medical decision making.   History:   HPI: Amy Huynh is a 63 y.o. female who presents today with complaints of right sided flank pain. The pain started 3 days ago, but became worse last night to the point she took some "leftover OxyContin" to help her go back to sleep. She has never had pain like this before. She states it rises to an 8/10. She reports nausea as an associated symptoms, but denies urinary frequency, dysuria or odor to her urine. No known hx of kidney stones, no recent abx.    Past Medical History:  Diagnosis Date  . Arthritis   . Family history of ovarian cancer 12/2016   MyRisk neg  . Fibromyalgia   . Migraine   . Neuropathy   . Placenta previa     Past Surgical History:  Procedure Laterality Date  . CERVICAL FUSION    . CESAREAN SECTION    . CHOLECYSTECTOMY    . HERNIA REPAIR    . REPLACEMENT TOTAL KNEE Left   . SHOULDER SURGERY Right     Family History  Problem Relation Age of Onset  . Ovarian cancer Maternal Aunt 30  . Uterine cancer Mother        61s  . COPD Mother   . Hypertension Mother   . Uterine cancer Maternal Grandmother        ? age  . Heart attack Father 46  . Other Father        DDD    Social History   Tobacco Use  . Smoking status: Never Smoker  . Smokeless tobacco: Never Used  Substance Use Topics  . Alcohol use: Yes    Comment: social  . Drug use: Never    Patient Active Problem List   Diagnosis Date Noted  . Family history  of ovarian cancer 01/11/2017  . Acute bursitis of right shoulder 06/07/2016  . Partial tear of right rotator cuff 06/07/2016  . Lumbar radiculopathy 07/23/2015  . Migraine without aura and without status migrainosus, not intractable 04/23/2015  . Chronic daily headache 04/23/2015  . DDD (degenerative disc disease), cervical 07/06/2014  . Neck pain 07/06/2014  . Dyspareunia 12/14/2013  . Benign essential hypertension 09/13/2013  . Polyarthralgia 07/20/2013  . Diverticula of colon 01/22/2013  . Fibromyalgia 01/22/2013  . Restless legs syndrome 01/22/2013  . Moderate obstructive sleep apnea 01/22/2013  . Migraine headache 01/22/2013  . Insomnia 01/22/2013  . Generalized anxiety disorder 01/22/2013  . Esophageal reflux 01/22/2013  . Atrophic vaginitis 01/22/2013  . Anemia 01/22/2013  . Allergic rhinitis 01/22/2013  . Urticaria 01/18/2013  . Low back pain 10/03/2012  . Lumbosacral spondylosis 08/17/2012    Home Medications:    Current Meds  Medication Sig  . butalbital-acetaminophen-caffeine (FIORICET, ESGIC) 50-325-40 MG tablet Take 1 tablet by mouth every 6 (six) hours as needed for headache.  . cyanocobalamin 1000 MCG tablet Take 1,000 mcg by mouth daily.  Marland Kitchen esomeprazole (NEXIUM) 40 MG capsule Take 40 mg by mouth daily at 12 noon.  Marland Kitchen estradiol (ESTRACE)  0.1 MG/GM vaginal cream I 0.5 GRAMS VAGINALLY 2 TIMES A WEEK AT NIGHT UTD  . fexofenadine (ALLEGRA) 180 MG tablet Take 180 mg by mouth daily.  Marland Kitchen gabapentin (NEURONTIN) 300 MG capsule Take 900 mg by mouth at bedtime.  . hydrochlorothiazide (HYDRODIURIL) 25 MG tablet Take by mouth.  . hydrOXYzine (ATARAX/VISTARIL) 10 MG tablet Take 10 mg by mouth every 8 (eight) hours as needed.  . meloxicam (MOBIC) 7.5 MG tablet Take 1 tablet (7.5 mg total) by mouth daily. Take with food  . methotrexate (RHEUMATREX) 2.5 MG tablet TAKE 6 TABLETS BY MOUTH EVERY 7 DAYS(ONE DAY PER WEEK)  . rizatriptan (MAXALT) 10 MG tablet Take 10 mg by mouth as  needed for migraine. May repeat in 2 hours if needed  . topiramate (TOPAMAX) 100 MG tablet Take 200 mg by mouth daily.  . valACYclovir (VALTREX) 1000 MG tablet Take 1 tablet once a day by mouth  . VITAMIN D, CHOLECALCIFEROL, PO Take by mouth.    Allergies:   Hydrocodone-acetaminophen, Cymbalta [duloxetine hcl], Lyrica [pregabalin], and Morphine and related  Review of Systems (ROS): Review of Systems  Constitutional: Positive for fatigue. Negative for chills and fever.  Gastrointestinal: Positive for abdominal pain and nausea. Negative for vomiting.  Genitourinary: Positive for flank pain. Negative for difficulty urinating, dyspareunia, dysuria, frequency and hematuria.  Musculoskeletal: Positive for back pain.  Neurological: Positive for weakness.     Vital Signs: Today's Vitals   01/27/19 1009 01/27/19 1010 01/27/19 1015 01/27/19 1155  BP:   123/76   Pulse:   76   Resp:   14   Temp:   97.6 F (36.4 C)   TempSrc:   Oral   SpO2:   100%   Weight:  175 lb (79.4 kg)    Height:  5' 2.5" (1.588 m)    PainSc: 8    8     Physical Exam: Physical Exam Vitals signs and nursing note reviewed.  Constitutional:      General: She is not in acute distress.    Appearance: She is well-developed. She is ill-appearing.  HENT:     Head: Normocephalic and atraumatic.  Eyes:     Conjunctiva/sclera: Conjunctivae normal.  Neck:     Musculoskeletal: Neck supple.  Cardiovascular:     Rate and Rhythm: Normal rate and regular rhythm.     Heart sounds: No murmur.  Pulmonary:     Effort: Pulmonary effort is normal. No respiratory distress.     Breath sounds: Normal breath sounds.  Abdominal:     Palpations: Abdomen is soft.     Tenderness: There is abdominal tenderness in the right lower quadrant, suprapubic area and left upper quadrant. There is right CVA tenderness.  Skin:    General: Skin is warm and dry.  Neurological:     Mental Status: She is alert.     Urgent Care Treatments /  Results:   LABS: PLEASE NOTE: all labs that were ordered this encounter are listed, however only abnormal results are displayed. Labs Reviewed  URINALYSIS, COMPLETE (UACMP) WITH MICROSCOPIC - Abnormal; Notable for the following components:      Result Value   Hgb urine dipstick SMALL (*)    All other components within normal limits  Unable to collect CBC.   EKG: -None  RADIOLOGY: No results found.  PROCEDURES: Procedures  MEDICATIONS RECEIVED THIS VISIT: Medications  ketorolac (TORADOL) injection 60 mg (60 mg Intramuscular Given 01/27/19 1052)  ondansetron (ZOFRAN-ODT) disintegrating tablet 8 mg (8  mg Oral Given 01/27/19 1049)    PERTINENT CLINICAL COURSE NOTES/UPDATES:   Initial Impression / Assessment and Plan / Urgent Care Course:  Pertinent labs & imaging results that were available during my care of the patient were personally reviewed by me and considered in my medical decision making (see lab/imaging section of note for values and interpretations).  Amy Huynh is a 63 y.o. female who presents to [facility] today with complaints of right flank pain, diagnosed with them same. Due to the severity of symptoms and the patient's no previous history of kidney stones, pt encouraged to go to ER for further imaging. Pt agreed to plan and stated she will head there upon discharge.     Final Clinical Impressions / Urgent Care Diagnoses:   Final diagnoses:  Other microscopic hematuria  Right flank pain    New Prescriptions:  [place] Controlled Substance Registry consulted? Not Applicable  Meds ordered this encounter  Medications  . ketorolac (TORADOL) injection 60 mg  . ondansetron (ZOFRAN-ODT) disintegrating tablet 8 mg      Discharge Instructions     You were seen for right sided flank pain. To be sure that you don't have any kidney blockages, you are being sent to the ER.      Recommended Follow up Care:  Patient discharged to local ER.    Bailey MechLunise Leeasia Secrist, DNP, NP-c    Bailey MechBenjamin, Christhoper Busbee, NP 01/27/19 1210

## 2019-01-27 NOTE — ED Notes (Signed)
Lab notified to come draw labs

## 2019-01-27 NOTE — ED Notes (Signed)
Pt given water to drink. Pt has call bell to let this RN know when she is able to urinate.

## 2019-01-27 NOTE — ED Provider Notes (Addendum)
[facility] Emergency Department Provider Note  ____________________________________________   First MD Initiated Contact with Patient 01/27/19 1527     (approximate)  I have reviewed the triage vital signs and the nursing notes.  History  Chief Complaint Flank Pain    HPI Amy Huynh is a 63 y.o. female with history of arthritis, sciatica neuropathy, fibromyalgia, who presents to the ER for R flank, lower back pain. Aching, sharp, muscular kind of pain. Patient states pain has been present since Wednesday, seems to come and go, but yesterday severity increased, which prompted her to seek care. She has some associate nausea, but no vomiting. No fevers, respiratory symptoms. No diarrhea. No dysuria or hematuria. No back injury or heavy lifting. No weakness, numbness, or tingling. No history of kidney stones.         Past Medical Hx Past Medical History:  Diagnosis Date  . Arthritis   . Family history of ovarian cancer 12/2016   MyRisk neg  . Fibromyalgia   . Migraine   . Neuropathy   . Placenta previa     Problem List Patient Active Problem List   Diagnosis Date Noted  . Family history of ovarian cancer 01/11/2017  . Acute bursitis of right shoulder 06/07/2016  . Partial tear of right rotator cuff 06/07/2016  . Lumbar radiculopathy 07/23/2015  . Migraine without aura and without status migrainosus, not intractable 04/23/2015  . Chronic daily headache 04/23/2015  . DDD (degenerative disc disease), cervical 07/06/2014  . Neck pain 07/06/2014  . Dyspareunia 12/14/2013  . Benign essential hypertension 09/13/2013  . Polyarthralgia 07/20/2013  . Diverticula of colon 01/22/2013  . Fibromyalgia 01/22/2013  . Restless legs syndrome 01/22/2013  . Moderate obstructive sleep apnea 01/22/2013  . Migraine headache 01/22/2013  . Insomnia 01/22/2013  . Generalized anxiety disorder 01/22/2013  . Esophageal reflux 01/22/2013  . Atrophic  vaginitis 01/22/2013  . Anemia 01/22/2013  . Allergic rhinitis 01/22/2013  . Urticaria 01/18/2013  . Low back pain 10/03/2012  . Lumbosacral spondylosis 08/17/2012    Past Surgical Hx Past Surgical History:  Procedure Laterality Date  . CERVICAL FUSION    . CESAREAN SECTION    . CHOLECYSTECTOMY    . HERNIA REPAIR    . REPLACEMENT TOTAL KNEE Left   . SHOULDER SURGERY Right     Medications Prior to Admission medications   Medication Sig Start Date End Date Taking? Authorizing Provider  aspirin-acetaminophen-caffeine (EXCEDRIN MIGRAINE) [phone] MG tablet Take by mouth.    [provider]  butalbital-acetaminophen-caffeine (FIORICET, ESGIC) 50-325-40 MG tablet Take 1 tablet by mouth every 6 (six) hours as needed for headache.    [provider]  clobetasol cream (TEMOVATE) 0.05 % Apply topically. 09/07/17   [provider]  cyanocobalamin 1000 MCG tablet Take 1,000 mcg by mouth daily.    [provider]  diclofenac sodium (VOLTAREN) 1 % GEL APP TOPICALLY 2 GRAMS AA QID FOR OSTEOARTHRITIS OF KNEES 01/02/18   [provider]  Doxepin HCl 5 % CREA Apply three times a day to itchy areas 07/14/16   [provider]  ERENUMAB-AOOE Everetts Inject into the skin. 12/27/16   [provider]  esomeprazole (NEXIUM) 40 MG capsule Take 40 mg by mouth daily at 12 noon.    [provider]  estradiol (ESTRACE) 0.1 MG/GM vaginal cream I 0.5 GRAMS VAGINALLY 2 TIMES A WEEK AT NIGHT UTD 08/09/16   [provider]  fexofenadine (ALLEGRA) 180 MG tablet Take 180  mg by mouth daily.    [provider]  gabapentin (NEURONTIN) 300 MG capsule Take 900 mg by mouth at bedtime.    [provider]  hydrochlorothiazide (HYDRODIURIL) 25 MG tablet Take by mouth. 07/27/17 01/27/19  [provider]  hydrOXYzine (ATARAX/VISTARIL) 10 MG tablet Take 10 mg by mouth every 8 (eight) hours as needed.    [provider]   hyoscyamine (LEVSIN, ANASPAZ) 0.125 MG tablet Take 0.125 mg by mouth every 4 (four) hours as needed.    [provider]  meloxicam (MOBIC) 7.5 MG tablet Take 1 tablet (7.5 mg total) by mouth daily. Take with food 07/04/18   Lorin Picket, PA-C  methocarbamol (ROBAXIN) 500 MG tablet Take by mouth. 08/22/17   [provider]  methotrexate (RHEUMATREX) 2.5 MG tablet TAKE 6 TABLETS BY MOUTH EVERY 7 DAYS(ONE DAY PER WEEK) 02/08/18   [provider]  rizatriptan (MAXALT) 10 MG tablet Take 10 mg by mouth as needed for migraine. May repeat in 2 hours if needed    [provider]  topiramate (TOPAMAX) 100 MG tablet Take 200 mg by mouth daily.    [provider]  valACYclovir (VALTREX) 1000 MG tablet Take 1 tablet once a day by mouth 07/14/16   [provider]  VITAMIN D, CHOLECALCIFEROL, PO Take by mouth.    [provider]    Allergies Hydrocodone-acetaminophen, Hydrocodone-acetaminophen, Morphine, Morphine and related, Cymbalta [duloxetine hcl], Duloxetine, and Pregabalin  Family Hx Family History  Problem Relation Age of Onset  . Ovarian cancer Maternal Aunt 30  . Uterine cancer Mother        10s  . COPD Mother   . Hypertension Mother   . Uterine cancer Maternal Grandmother        ? age  . Heart attack Father 51  . Other Father        DDD    Social Hx Social History   Tobacco Use  . Smoking status: Never Smoker  . Smokeless tobacco: Never Used  Substance Use Topics  . Alcohol use: Yes    Comment: social  . Drug use: Never     Review of Systems  Constitutional: Negative for fever. Negative for chills. Eyes: Negative for visual changes. ENT: Negative for sore throat. Cardiovascular: Negative for chest pain. Respiratory: Negative for shortness of breath. Gastrointestinal: Negative for abdominal pain. Negative for nausea. Negative for vomiting. Genitourinary: Negative for dysuria. Musculoskeletal: + R flank, R low  back pain Skin: Negative for rash. Neurological: Negative for for headaches.   Physical Exam  Vital Signs: ED Triage Vitals  Enc Vitals Group     BP 01/27/19 1233 (!) 142/84     Pulse Rate 01/27/19 1233 62     Resp 01/27/19 1233 18     Temp 01/27/19 1233 (!) 97.2 F (36.2 C)     Temp Source 01/27/19 1233 Axillary     SpO2 01/27/19 1233 100 %     Weight 01/27/19 1237 175 lb (79.4 kg)     Height 01/27/19 1237 5' 2.5" (1.588 m)     Head Circumference --      Peak Flow --      Pain Score 01/27/19 1237 5     Pain Loc --      Pain Edu? --      Excl. in [place]? --     Constitutional: Alert and oriented.  Eyes: Conjunctivae clear. Sclera anicteric. Head: Normocephalic. Atraumatic. Nose: No congestion. No rhinorrhea. Mouth/Throat:  Mucous membranes are moist.  Neck: No stridor.   Cardiovascular: Normal rate, regular rhythm. No murmurs. Extremities well perfused. Equal symmetric 2+ DP pulses.  Respiratory: Normal respiratory effort.  Lungs CTAB. Gastrointestinal: Soft and non-tender. No distention. No CVA tenderness.  Musculoskeletal: No lower extremity edema. R lumbar paraspinal muscle tender, pain reproducible with palpation. No midline T or L spine tenderness. Neurologic:  Normal speech and language. No gross focal neurologic deficits are appreciated. BLE strength 5/5 and symmetric, SILT. Skin: Skin is warm, dry and intact. No rash noted. Psychiatric: Mood and affect are appropriate for situation.  EKG  N/A    Radiology  CT: IMPRESSION: 1. No findings to explain the patient's history of right flank pain. 2. Stable distention of the extrahepatic common duct at 13 mm. This is probably related to the prior cholecystectomy, but correlation with liver function test recommended.   Procedures  Procedure(s) performed (including critical care):  Procedures   Initial Impression / Assessment and Plan / ED Course  63 y.o. female who presents to the ED for R flank/R lower  back pain, as above.  Ddx: stone, sciatica, MSK, low back pain, UTI/pyelo. No associate neurological symptoms to suggest cord pathology. On exam she has equal and symmetric distal pulses, no anemia, no neuro symptoms suggestive of aortic pathology. No rash or vesicles on exam to suggest shingles.   Plan: labs, urine, CT, symptom control and reassess.  No evidence of stone on CT, no acute abnormalities. Stable distention of the extrahepatic common duct at 13 mm. Normal LFTs and lipase. No infection on UA. As such, suspect MSK etiology. Will plan for DC w/ supportive care, stretches, and advised PCP follow up. Patient agreeable. Given return precautions.     Final Clinical Impression(s) / ED Diagnosis  Final diagnoses:  Right flank pain  Musculoskeletal back pain       Note:  This document was prepared using Dragon voice recognition software and may include unintentional dictation errors.   Miguel AschoffMonks,  L., MD 01/27/19 2252    Miguel AschoffMonks,  L., MD 01/27/19 2253

## 2019-01-27 NOTE — Discharge Instructions (Signed)
Thank you for letting us take care of you in the emergency department today.   Please continue to take any regular, prescribed medications.   New medications we have prescribed:  - Lidocaine patches - Voltaren gel  Please follow up with: - Your primary care doctor to review your ER visit and follow up on your symptoms.   Please return to the ER for any new or worsening symptoms.

## 2019-01-27 NOTE — ED Triage Notes (Addendum)
Pt arrived via POV from [place] with reports of R flank pain that started Wednesday and has been off and on since. Pt states pain returned last night worse.  Pt reported nausea with the pain and took zofran.  Pt had dose of zofran at urgent care and toradol at [facility].  Pt states [facility] did not get blood work but did get urine sample.

## 2019-01-27 NOTE — ED Notes (Signed)
Pt states she has been stuck 3 times at [facility] and stuck once here. Lab will come to draw pt's labs.

## 2019-01-27 NOTE — Discharge Instructions (Addendum)
You were seen for right sided flank pain. To be sure that you don't have any kidney blockages, you are being sent to the ER.

## 2019-01-29 LAB — URINE CULTURE

## 2019-01-29 NOTE — Progress Notes (Signed)
Lab called pharmacy about a correction in lab results:   CORRECTED RESULTS 10,000 COLONIES/mL LACTOBACILLUS SPECIES  Standardized susceptibility testing for this organism is not available.  PREVIOUSLY REPORTED AS: >=100,000 COLONIES/mL GROUP B STREP(S.AGALACTIAE)ISOLATED  TESTING AGAINST S. AGALACTIAE NOT ROUTINELY PERFORMED DUE TO PREDICTABILITY OF AMP/PEN/VAN SUSCEPTIBILITY.   Patient's UA was not indicative of a UTI and patient presented with no sxs of a UTI other than right-side flank pain, which was noted to be more muscular skeletal pain that was associated with nausea. Lactobacillus is part of normal vaginal flora.   Called and talked to patient who denied dysuria and fever/chills yet back pain has improved. However, patient noticed that she had some worsening polyuria  since last seen in the ED. Patient was encouraged to get repeat urine sample from ER, PCP, or urgent care as soon as possible.   Patient was presented to Dr. Charna Archer. It was discussed that it patient endorsed sxs, to get a repeat urine sample.   Kristeen Miss, PharmD Clinical Pharmacist

## 2019-02-15 DIAGNOSIS — L409 Psoriasis, unspecified: Secondary | ICD-10-CM | POA: Insufficient documentation

## 2019-03-11 ENCOUNTER — Ambulatory Visit
Admission: EM | Admit: 2019-03-11 | Discharge: 2019-03-11 | Disposition: A | Discharge status: Home / Self Care | Payer: Medicare Other | Attending: Emergency Medicine | Admitting: Emergency Medicine

## 2019-03-11 ENCOUNTER — Encounter: Payer: Self-pay | Admitting: Emergency Medicine

## 2019-03-11 ENCOUNTER — Other Ambulatory Visit: Payer: Self-pay

## 2019-03-11 DIAGNOSIS — X500XXA Overexertion from strenuous movement or load, initial encounter: Secondary | ICD-10-CM

## 2019-03-11 DIAGNOSIS — S39012A Strain of muscle, fascia and tendon of lower back, initial encounter: Secondary | ICD-10-CM | POA: Diagnosis not present

## 2019-03-11 MED ORDER — PREDNISONE 10 MG (21) PO TBPK: ORAL_TABLET | ORAL | 0 refills | Status: DC

## 2019-03-11 MED ORDER — OXYCODONE HCL 5 MG PO TABS: 5.0000 mg | ORAL_TABLET | Freq: Three times a day (TID) | ORAL | 0 refills | Status: DC | PRN

## 2019-03-11 MED ORDER — METHOCARBAMOL 750 MG PO TABS: 750.0000 mg | ORAL_TABLET | Freq: Every day | ORAL | 0 refills | Status: DC

## 2019-03-11 NOTE — ED Triage Notes (Signed)
Patient c/o lower back pain that started yesterday after bending over and picking up a package.

## 2019-03-11 NOTE — ED Provider Notes (Signed)
[facility] - [place], [place]   Name: Amy Huynh DOB: 1955/06/29 MRN: [id number] CSN: [id number] PCP: Oletha Blend, MD  Arrival date and time:  03/11/19 0916  Chief Complaint:  Back Pain   NOTE: Prior to seeing the patient today, I have reviewed the triage nursing documentation and vital signs. Clinical staff has updated patient's PMH/PSHx, current medication list, and drug allergies/intolerances to ensure comprehensive history available to assist in medical decision making.   History:   HPI: Amy Huynh is a 63 y.o. female who presents today with complaints of lower back pain. She was reaching down to pick something up yesterday afternoon when she felt a 'catch' to her left lower back. Pain has consistently been at a 9/10 since. No change noted with a topical Flector patch. Patient has a history of similars events due to her history of DDD and scutica. She states the pain feels similar. Denies any incontinence, nausea, extremity numbness. She notes the pain starts at her left lumbar region and radiates down her leg and wraps around to the front of her thigh.     Past Medical History:  Diagnosis Date  . Arthritis   . Family history of ovarian cancer 12/2016   MyRisk neg  . Fibromyalgia   . Migraine   . Neuropathy   . Placenta previa     Past Surgical History:  Procedure Laterality Date  . CERVICAL FUSION    . CESAREAN SECTION    . CHOLECYSTECTOMY    . HERNIA REPAIR    . REPLACEMENT TOTAL KNEE Left   . SHOULDER SURGERY Right     Family History  Problem Relation Age of Onset  . Ovarian cancer Maternal Aunt 30  . Uterine cancer Mother        67s  . COPD Mother   . Hypertension Mother   . Uterine cancer Maternal Grandmother        ? age  . Heart attack Father 29  . Other Father        DDD    Social History   Tobacco Use  . Smoking status: Never Smoker  . Smokeless tobacco: Never Used  Substance Use Topics  . Alcohol use: Yes     Comment: social  . Drug use: Never    Patient Active Problem List   Diagnosis Date Noted  . Family history of ovarian cancer 01/11/2017  . Acute bursitis of right shoulder 06/07/2016  . Partial tear of right rotator cuff 06/07/2016  . Lumbar radiculopathy 07/23/2015  . Migraine without aura and without status migrainosus, not intractable 04/23/2015  . Chronic daily headache 04/23/2015  . DDD (degenerative disc disease), cervical 07/06/2014  . Neck pain 07/06/2014  . Dyspareunia 12/14/2013  . Benign essential hypertension 09/13/2013  . Polyarthralgia 07/20/2013  . Diverticula of colon 01/22/2013  . Fibromyalgia 01/22/2013  . Restless legs syndrome 01/22/2013  . Moderate obstructive sleep apnea 01/22/2013  . Migraine headache 01/22/2013  . Insomnia 01/22/2013  . Generalized anxiety disorder 01/22/2013  . Esophageal reflux 01/22/2013  . Atrophic vaginitis 01/22/2013  . Anemia 01/22/2013  . Allergic rhinitis 01/22/2013  . Urticaria 01/18/2013  . Low back pain 10/03/2012  . Lumbosacral spondylosis 08/17/2012    Home Medications:    Current Meds  Medication Sig  . butalbital-acetaminophen-caffeine (FIORICET, ESGIC) 50-325-40 MG tablet Take 1 tablet by mouth every 6 (six) hours as needed for headache.  . cyanocobalamin 1000 MCG tablet Take 1,000 mcg by mouth daily.  Marland Kitchen  esomeprazole (NEXIUM) 40 MG capsule Take 40 mg by mouth daily at 12 noon.  . fexofenadine (ALLEGRA) 180 MG tablet Take 180 mg by mouth daily.  Marland Kitchen gabapentin (NEURONTIN) 300 MG capsule Take 900 mg by mouth at bedtime.  . hydrochlorothiazide (HYDRODIURIL) 25 MG tablet Take by mouth.  . hydrOXYzine (ATARAX/VISTARIL) 10 MG tablet Take 10 mg by mouth every 8 (eight) hours as needed.  . hyoscyamine (LEVSIN, ANASPAZ) 0.125 MG tablet Take 0.125 mg by mouth every 4 (four) hours as needed.  . meloxicam (MOBIC) 7.5 MG tablet Take 1 tablet (7.5 mg total) by mouth daily. Take with food  . methocarbamol (ROBAXIN) 500 MG  tablet Take by mouth.  . methotrexate (RHEUMATREX) 2.5 MG tablet TAKE 6 TABLETS BY MOUTH EVERY 7 DAYS(ONE DAY PER WEEK)  . rizatriptan (MAXALT) 10 MG tablet Take 10 mg by mouth as needed for migraine. May repeat in 2 hours if needed  . topiramate (TOPAMAX) 100 MG tablet Take 200 mg by mouth daily.  Marland Kitchen VITAMIN D, CHOLECALCIFEROL, PO Take by mouth.    Allergies:   Hydrocodone-acetaminophen, Hydrocodone-acetaminophen, Morphine, Morphine and related, Cymbalta [duloxetine hcl], Duloxetine, and Pregabalin  Review of Systems (ROS): Review of Systems  Musculoskeletal: Positive for back pain, gait problem and myalgias.  Neurological: Negative for numbness.  All other systems reviewed and are negative.    Vital Signs: Today's Vitals   03/11/19 0922 03/11/19 0923 03/11/19 0948  BP:  133/86   Pulse:  86   Resp:  14   Temp:  98.2 F (36.8 C)   TempSrc:  Oral   SpO2:  98%   Weight: 175 lb (79.4 kg)    Height: 5' 2.5" (1.588 m)    PainSc: 9   9     Physical Exam: Physical Exam Vitals signs and nursing note reviewed.  Constitutional:      Appearance: Normal appearance.  Neck:     Musculoskeletal: Normal range of motion and neck supple.  Musculoskeletal:     Lumbar back: She exhibits normal range of motion.     Comments: Limited flexion, left lateral bending and left rotation due to pain. Spine tenderness begins at L1  Skin:    General: Skin is warm and dry.  Neurological:     General: No focal deficit present.     Mental Status: She is alert and oriented to person, place, and time.      Urgent Care Treatments / Results:   LABS: PLEASE NOTE: all labs that were ordered this encounter are listed, however only abnormal results are displayed. Labs Reviewed - No data to display  EKG: -None  RADIOLOGY: No results found.  PROCEDURES: Procedures  MEDICATIONS RECEIVED THIS VISIT: Medications - No data to display  PERTINENT CLINICAL COURSE NOTES/UPDATES:   Initial  Impression / Assessment and Plan / Urgent Care Course:  Pertinent labs & imaging results that were available during my care of the patient were personally reviewed by me and considered in my medical decision making (see lab/imaging section of note for values and interpretations).  Amy Huynh is a 63 y.o. female who presents to [facility] today with complaints of back pain, diagnosed with lumbar strain, and treated as such with the medications below. NP and patient reviewed discharge instructions below during visit.   Patient is well appearing overall in clinic today. She does not appear to be in any acute distress. Presenting symptoms (see HPI) and exam as documented above.   I have reviewed  the follow up and strict return precautions for any new or worsening symptoms. Patient is aware of symptoms that would be deemed urgent/emergent, and would thus require further evaluation either here or in the emergency department. At the time of discharge, she verbalized understanding and consent with the discharge plan as it was reviewed with her. All questions were fielded by provider and/or clinic staff prior to patient discharge.    Final Clinical Impressions / Urgent Care Diagnoses:   Final diagnoses:  Strain of lumbar region, initial encounter    New Prescriptions:  [place] Controlled Substance Registry consulted? Yes, I have consulted the [place] Controlled Substances Registry for this patient, and feel the risk/benefit ratio today is favorable for proceeding with this prescription for a controlled substance.  Meds ordered this encounter  Medications  . predniSONE (STERAPRED UNI-PAK 21 TAB) 10 MG (21) TBPK tablet    Sig: Take as instructed on package (60, 50, 40, 30, 20, 10)    Dispense:  1 each    Refill:  0  . methocarbamol (ROBAXIN) 750 MG tablet    Sig: Take 1 tablet (750 mg total) by mouth at bedtime.    Dispense:  10 tablet    Refill:  0  . oxyCODONE (ROXICODONE) 5 MG  immediate release tablet    Sig: Take 1 tablet (5 mg total) by mouth every 8 (eight) hours as needed for severe pain.    Dispense:  5 tablet    Refill:  0      Discharge Instructions     You are to STOP your 500mg  dose of Robaxin and use the new 750mg  dose instead until pain decreases.   Finish the steroids as prescribed. You can add tylenol to help with your pain.   Rotate heat and ice to area.   Follow-up with your primary care provider if you're not feeling better.     Recommended Follow up Care:  Patient encouraged to follow up with the following provider within the specified time frame, or sooner as dictated by the severity of her symptoms. As always, she was instructed that for any urgent/emergent care needs, she should seek care either here or in the emergency department for more immediate evaluation.   Bailey MechLunise Borghild Thaker, DNP, NP-c    Bailey MechBenjamin, Tamico Mundo, NP 03/11/19 1011

## 2019-03-11 NOTE — Discharge Instructions (Signed)
You are to STOP your 500mg  dose of Robaxin and use the new 750mg  dose instead until pain decreases.   Finish the steroids as prescribed. You can add tylenol to help with your pain.   Rotate heat and ice to area.   Follow-up with your primary care provider if you're not feeling better.

## 2019-09-02 ENCOUNTER — Ambulatory Visit
Admission: EM | Admit: 2019-09-02 | Discharge: 2019-09-02 | Disposition: A | Discharge status: Home / Self Care | Payer: Medicare PPO | Attending: Family Medicine | Admitting: Family Medicine

## 2019-09-02 ENCOUNTER — Encounter: Payer: Self-pay | Admitting: Emergency Medicine

## 2019-09-02 ENCOUNTER — Other Ambulatory Visit: Payer: Self-pay

## 2019-09-02 DIAGNOSIS — R5383 Other fatigue: Secondary | ICD-10-CM | POA: Insufficient documentation

## 2019-09-02 DIAGNOSIS — E876 Hypokalemia: Secondary | ICD-10-CM

## 2019-09-02 DIAGNOSIS — E871 Hypo-osmolality and hyponatremia: Secondary | ICD-10-CM

## 2019-09-02 HISTORY — DX: Sjogren syndrome, unspecified: M35.00

## 2019-09-02 LAB — CBC WITH DIFFERENTIAL/PLATELET
Abs Immature Granulocytes: 0.11 10*3/uL — ABNORMAL HIGH (ref 0.00–0.07)
Basophils Absolute: 0 10*3/uL (ref 0.0–0.1)
Basophils Relative: 0 %
Eosinophils Absolute: 0.1 10*3/uL (ref 0.0–0.5)
Eosinophils Relative: 1 %
HCT: 38.6 % (ref 36.0–46.0)
Hemoglobin: 13.1 g/dL (ref 12.0–15.0)
Immature Granulocytes: 2 %
Lymphocytes Relative: 12 %
Lymphs Abs: 0.8 10*3/uL (ref 0.7–4.0)
MCH: 28 pg (ref 26.0–34.0)
MCHC: 33.9 g/dL (ref 30.0–36.0)
MCV: 82.5 fL (ref 80.0–100.0)
Monocytes Absolute: 0.7 10*3/uL (ref 0.1–1.0)
Monocytes Relative: 10 %
Neutro Abs: 5.2 10*3/uL (ref 1.7–7.7)
Neutrophils Relative %: 75 %
Platelets: 268 10*3/uL (ref 150–400)
RBC: 4.68 MIL/uL (ref 3.87–5.11)
RDW: 18.6 % — ABNORMAL HIGH (ref 11.5–15.5)
WBC: 6.8 10*3/uL (ref 4.0–10.5)
nRBC: 0 % (ref 0.0–0.2)

## 2019-09-02 LAB — COMPREHENSIVE METABOLIC PANEL
ALT: 33 U/L (ref 0–44)
AST: 31 U/L (ref 15–41)
Albumin: 4.2 g/dL (ref 3.5–5.0)
Alkaline Phosphatase: 76 U/L (ref 38–126)
Anion gap: 11 (ref 5–15)
BUN: 13 mg/dL (ref 8–23)
CO2: 28 mmol/L (ref 22–32)
Calcium: 9.1 mg/dL (ref 8.9–10.3)
Chloride: 88 mmol/L — ABNORMAL LOW (ref 98–111)
Creatinine, Ser: 1 mg/dL (ref 0.44–1.00)
GFR calc Af Amer: >60 mL/min (ref >60)
GFR calc non Af Amer: >60 mL/min (ref >60)
Glucose, Bld: 116 mg/dL — ABNORMAL HIGH (ref 70–99)
Potassium: 2.5 mmol/L — CL (ref 3.5–5.1)
Sodium: 127 mmol/L — ABNORMAL LOW (ref 135–145)
Total Bilirubin: 0.4 mg/dL (ref 0.3–1.2)
Total Protein: 7.4 g/dL (ref 6.5–8.1)

## 2019-09-02 LAB — LIPASE, BLOOD: Lipase: 30 U/L (ref 11–51)

## 2019-09-02 NOTE — ED Triage Notes (Signed)
Patient in today c/o abdominal pain, feeling full after only eating a bite or 2 of food x 4-5 days. Patient states she had diarrhea 1 day, but has since resolved. Patient is c/o extreme fatigue x 1 month. Patient denies urinary frequency or dysuria. Patient states she had a cramp in her right leg x once. Patient states she has been seeing floaters as well.

## 2019-09-02 NOTE — Discharge Instructions (Signed)
Please go to the ER.   You need IV fluids and likely IV potassium.  Dr. Adriana Simas

## 2019-09-02 NOTE — ED Provider Notes (Signed)
[facility]    CSN: [id number] Arrival date & time: 09/02/19  1045   History   Chief Complaint Chief Complaint  Patient presents with  . Abdominal Pain  . Fatigue   HPI  64 year old female presents with multiple complaints.  Patient reports that she has not been feeling well since Tuesday to Wednesday of this week.  She reports ongoing upper abdominal pain, early satiety.  She states that she had some diarrhea briefly but this has resolved.  She reports extreme fatigue which appears to be chronic.  She states that she has had this for at least a month.  She has discussed this with her primary care provider.  However, she does state that the fatigue is worse than it has been.  Patient denies urinary symptoms.  She also notes that she had a cramp in her right leg on one occasion and also had floaters in her eyes.  No relieving factors.  Patient is concerned that she is dehydrated.  She states that she has not taken much by mouth recently.  She is tolerating fluids.   Past Medical History:  Diagnosis Date  . Arthritis   . Family history of ovarian cancer 12/2016   MyRisk neg  . Fibromyalgia   . Migraine   . Neuropathy   . Placenta previa   . Sjogren's disease [facility])     Patient Active Problem List   Diagnosis Date Noted  . Family history of ovarian cancer 01/11/2017  . Acute bursitis of right shoulder 06/07/2016  . Partial tear of right rotator cuff 06/07/2016  . Lumbar radiculopathy 07/23/2015  . Migraine without aura and without status migrainosus, not intractable 04/23/2015  . Chronic daily headache 04/23/2015  . DDD (degenerative disc disease), cervical 07/06/2014  . Neck pain 07/06/2014  . Dyspareunia 12/14/2013  . Benign essential hypertension 09/13/2013  . Polyarthralgia 07/20/2013  . Diverticula of colon 01/22/2013  . Fibromyalgia 01/22/2013  . Restless legs syndrome 01/22/2013  . Moderate obstructive sleep apnea 01/22/2013  . Migraine headache  01/22/2013  . Insomnia 01/22/2013  . Generalized anxiety disorder 01/22/2013  . Esophageal reflux 01/22/2013  . Atrophic vaginitis 01/22/2013  . Anemia 01/22/2013  . Allergic rhinitis 01/22/2013  . Urticaria 01/18/2013  . Low back pain 10/03/2012  . Lumbosacral spondylosis 08/17/2012    Past Surgical History:  Procedure Laterality Date  . CERVICAL FUSION    . CESAREAN SECTION    . CHOLECYSTECTOMY    . HERNIA REPAIR    . REPLACEMENT TOTAL KNEE Left   . SHOULDER SURGERY Right     OB History    Gravida  4   Para  3   Term  3   Preterm      AB  1   Living  2     SAB  1   TAB      Ectopic      Multiple      Live Births  3            Home Medications    Prior to Admission medications   Medication Sig Start Date End Date Taking? Authorizing Provider  aspirin-acetaminophen-caffeine (EXCEDRIN MIGRAINE) [phone] MG tablet Take by mouth.   Yes [provider]  butalbital-acetaminophen-caffeine (FIORICET, ESGIC) 50-325-40 MG tablet Take 1 tablet by mouth every 6 (six) hours as needed for headache.   Yes [provider]  clobetasol cream (TEMOVATE) 0.05 % Apply topically. 09/07/17  Yes [provider]  cyanocobalamin 1000 MCG  tablet Take 1,000 mcg by mouth daily.   Yes [provider]  esomeprazole (NEXIUM) 40 MG capsule Take 40 mg by mouth daily at 12 noon.   Yes [provider]  fexofenadine (ALLEGRA) 180 MG tablet Take 180 mg by mouth daily.   Yes [provider]  folic acid (FOLVITE) 1 MG tablet Take 1 mg by mouth daily. 08/16/19  Yes [provider]  gabapentin (NEURONTIN) 300 MG capsule Take 900 mg by mouth at bedtime.   Yes [provider]  hydrochlorothiazide (HYDRODIURIL) 25 MG tablet Take by mouth. 07/27/17 09/02/19 Yes [provider]  hydrOXYzine (ATARAX/VISTARIL) 10 MG tablet Take 10 mg by mouth every 8 (eight) hours as needed.   Yes [provider]  hyoscyamine  (LEVSIN, ANASPAZ) 0.125 MG tablet Take 0.125 mg by mouth every 4 (four) hours as needed.   Yes [provider]  methocarbamol (ROBAXIN) 500 MG tablet Take by mouth. 08/22/17  Yes [provider]  methotrexate (RHEUMATREX) 2.5 MG tablet TAKE 6 TABLETS BY MOUTH EVERY 7 DAYS(ONE DAY PER WEEK) 02/08/18  Yes [provider]  rizatriptan (MAXALT) 10 MG tablet Take 10 mg by mouth as needed for migraine. May repeat in 2 hours if needed   Yes [provider]  rOPINIRole (REQUIP) 0.5 MG tablet Take 1 tablet by mouth daily. 07/26/19 07/25/20 Yes [provider]  topiramate (TOPAMAX) 100 MG tablet Take 200 mg by mouth daily.   Yes [provider]  traZODone (DESYREL) 50 MG tablet Take 1 tablet by mouth daily as needed. 12/07/17  Yes [provider]  valACYclovir (VALTREX) 1000 MG tablet Take 1 tablet once a day by mouth 07/14/16  Yes [provider]  VITAMIN D, CHOLECALCIFEROL, PO Take by mouth.   Yes [provider]    Family History Family History  Problem Relation Age of Onset  . Ovarian cancer Maternal Aunt 30  . Uterine cancer Mother        18s  . COPD Mother   . Hypertension Mother   . Uterine cancer Maternal Grandmother        ? age  . Heart attack Father 22  . Other Father        DDD    Social History Social History   Tobacco Use  . Smoking status: Never Smoker  . Smokeless tobacco: Never Used  Substance Use Topics  . Alcohol use: Not Currently    Comment: social  . Drug use: Never     Allergies   Hydrocodone-acetaminophen, Hydrocodone-acetaminophen, Morphine, Morphine and related, Cymbalta [duloxetine hcl], Duloxetine, and Pregabalin   Review of Systems Review of Systems Per HPI  Physical Exam Triage Vital Signs ED Triage Vitals  Enc Vitals Group     BP 09/02/19 1121 118/72     Pulse Rate 09/02/19 1121 80     Resp 09/02/19 1121 18     Temp 09/02/19 1121 98.2 F (36.8 C)     Temp Source  09/02/19 1121 Oral     SpO2 09/02/19 1121 100 %     Weight 09/02/19 1121 182 lb (82.6 kg)     Height 09/02/19 1121 5' 2.5" (1.588 m)     Head Circumference --      Peak Flow --      Pain Score 09/02/19 1120 2     Pain Loc --      Pain Edu? --      Excl. in [place]? --    Updated  Vital Signs BP 118/72 (BP Location: Left Arm)   Pulse 80   Temp 98.2 F (36.8 C) (Oral)   Resp 18   Ht 5' 2.5" (1.588 m)   Wt 82.6 kg   SpO2 100%   BMI 32.76 kg/m   Visual Acuity Right Eye Distance:   Left Eye Distance:   Bilateral Distance:    Right Eye Near:   Left Eye Near:    Bilateral Near:     Physical Exam Vitals and nursing note reviewed.  Constitutional:      General: She is not in acute distress.    Appearance: Normal appearance. She is not ill-appearing.  HENT:     Head: Normocephalic and atraumatic.  Eyes:     General:        Right eye: No discharge.        Left eye: No discharge.     Conjunctiva/sclera: Conjunctivae normal.  Cardiovascular:     Rate and Rhythm: Normal rate and regular rhythm.  Pulmonary:     Effort: Pulmonary effort is normal.     Breath sounds: Normal breath sounds. No wheezing, rhonchi or rales.  Abdominal:     General: There is no distension.     Palpations: Abdomen is soft.     Comments: Epigastric tenderness to palpation.  Neurological:     Mental Status: She is alert.  Psychiatric:        Mood and Affect: Mood normal.        Behavior: Behavior normal.    UC Treatments / Results  Labs (all labs ordered are listed, but only abnormal results are displayed) Labs Reviewed  CBC WITH DIFFERENTIAL/PLATELET - Abnormal; Notable for the following components:      Result Value   RDW 18.6 (*)    Abs Immature Granulocytes 0.11 (*)    All other components within normal limits  COMPREHENSIVE METABOLIC PANEL - Abnormal; Notable for the following components:   Sodium 127 (*)    Potassium 2.5 (*)    Chloride 88 (*)    Glucose, Bld 116 (*)    All other  components within normal limits  LIPASE, BLOOD    EKG   Radiology No results found.  Procedures Procedures (including critical care time)  Medications Ordered in UC Medications - No data to display  Initial Impression / Assessment and Plan / UC Course  I have reviewed the triage vital signs and the nursing notes.  Pertinent labs & imaging results that were available during my care of the patient were reviewed by me and considered in my medical decision making (see chart for details).    64 year old female presents with a myriad of symptoms.  Laboratory studies obtained and revealed hyponatremia at 127 and hypokalemia at 2.5.  Critical potassium.  Sending to the ER for fluids and potassium replacement.  Final Clinical Impressions(s) / UC Diagnoses   Final diagnoses:  Other fatigue  Hyponatremia  Hypokalemia     Discharge Instructions     Please go to the ER.   You need IV fluids and likely IV potassium.  Dr. Lacinda Axon    ED Prescriptions    None     PDMP not reviewed this encounter.   Coral Spikes, DO 09/02/19 1401

## 2019-09-12 ENCOUNTER — Inpatient Hospital Stay: Payer: Medicare PPO

## 2019-09-12 ENCOUNTER — Other Ambulatory Visit: Payer: Self-pay

## 2019-09-12 ENCOUNTER — Inpatient Hospital Stay
Admission: EM | Admit: 2019-09-12 | Discharge: 2019-09-16 | DRG: 193 | Disposition: A | Discharge status: Home Health | Payer: Medicare PPO | Attending: Internal Medicine | Admitting: Internal Medicine

## 2019-09-12 ENCOUNTER — Emergency Department: Payer: Medicare PPO

## 2019-09-12 DIAGNOSIS — Z885 Allergy status to narcotic agent status: Secondary | ICD-10-CM

## 2019-09-12 DIAGNOSIS — J810 Acute pulmonary edema: Secondary | ICD-10-CM | POA: Diagnosis present

## 2019-09-12 DIAGNOSIS — Z20822 Contact with and (suspected) exposure to covid-19: Secondary | ICD-10-CM | POA: Diagnosis present

## 2019-09-12 DIAGNOSIS — M3500 Sicca syndrome, unspecified: Secondary | ICD-10-CM | POA: Diagnosis present

## 2019-09-12 DIAGNOSIS — R0902 Hypoxemia: Secondary | ICD-10-CM

## 2019-09-12 DIAGNOSIS — K219 Gastro-esophageal reflux disease without esophagitis: Secondary | ICD-10-CM | POA: Diagnosis present

## 2019-09-12 DIAGNOSIS — I509 Heart failure, unspecified: Secondary | ICD-10-CM | POA: Diagnosis present

## 2019-09-12 DIAGNOSIS — E876 Hypokalemia: Secondary | ICD-10-CM | POA: Diagnosis present

## 2019-09-12 DIAGNOSIS — Z9049 Acquired absence of other specified parts of digestive tract: Secondary | ICD-10-CM

## 2019-09-12 DIAGNOSIS — J309 Allergic rhinitis, unspecified: Secondary | ICD-10-CM | POA: Diagnosis present

## 2019-09-12 DIAGNOSIS — K589 Irritable bowel syndrome without diarrhea: Secondary | ICD-10-CM | POA: Diagnosis present

## 2019-09-12 DIAGNOSIS — Z981 Arthrodesis status: Secondary | ICD-10-CM | POA: Diagnosis not present

## 2019-09-12 DIAGNOSIS — N390 Urinary tract infection, site not specified: Secondary | ICD-10-CM | POA: Diagnosis present

## 2019-09-12 DIAGNOSIS — Z96652 Presence of left artificial knee joint: Secondary | ICD-10-CM | POA: Diagnosis present

## 2019-09-12 DIAGNOSIS — Z8249 Family history of ischemic heart disease and other diseases of the circulatory system: Secondary | ICD-10-CM

## 2019-09-12 DIAGNOSIS — G43909 Migraine, unspecified, not intractable, without status migrainosus: Secondary | ICD-10-CM | POA: Diagnosis present

## 2019-09-12 DIAGNOSIS — M6281 Muscle weakness (generalized): Secondary | ICD-10-CM | POA: Diagnosis not present

## 2019-09-12 DIAGNOSIS — G4700 Insomnia, unspecified: Secondary | ICD-10-CM | POA: Diagnosis present

## 2019-09-12 DIAGNOSIS — F411 Generalized anxiety disorder: Secondary | ICD-10-CM | POA: Diagnosis present

## 2019-09-12 DIAGNOSIS — J9601 Acute respiratory failure with hypoxia: Secondary | ICD-10-CM | POA: Diagnosis present

## 2019-09-12 DIAGNOSIS — I110 Hypertensive heart disease with heart failure: Secondary | ICD-10-CM | POA: Diagnosis present

## 2019-09-12 DIAGNOSIS — G4733 Obstructive sleep apnea (adult) (pediatric): Secondary | ICD-10-CM | POA: Diagnosis present

## 2019-09-12 DIAGNOSIS — R918 Other nonspecific abnormal finding of lung field: Secondary | ICD-10-CM | POA: Diagnosis present

## 2019-09-12 DIAGNOSIS — M797 Fibromyalgia: Secondary | ICD-10-CM | POA: Diagnosis present

## 2019-09-12 DIAGNOSIS — Z79899 Other long term (current) drug therapy: Secondary | ICD-10-CM

## 2019-09-12 DIAGNOSIS — J189 Pneumonia, unspecified organism: Secondary | ICD-10-CM

## 2019-09-12 DIAGNOSIS — D849 Immunodeficiency, unspecified: Secondary | ICD-10-CM | POA: Diagnosis present

## 2019-09-12 DIAGNOSIS — J181 Lobar pneumonia, unspecified organism: Secondary | ICD-10-CM | POA: Diagnosis present

## 2019-09-12 DIAGNOSIS — R0602 Shortness of breath: Secondary | ICD-10-CM | POA: Diagnosis not present

## 2019-09-12 DIAGNOSIS — G2581 Restless legs syndrome: Secondary | ICD-10-CM | POA: Diagnosis present

## 2019-09-12 DIAGNOSIS — I10 Essential (primary) hypertension: Secondary | ICD-10-CM | POA: Diagnosis present

## 2019-09-12 DIAGNOSIS — M899 Disorder of bone, unspecified: Secondary | ICD-10-CM

## 2019-09-12 DIAGNOSIS — Z79891 Long term (current) use of opiate analgesic: Secondary | ICD-10-CM | POA: Diagnosis not present

## 2019-09-12 DIAGNOSIS — Z888 Allergy status to other drugs, medicaments and biological substances status: Secondary | ICD-10-CM

## 2019-09-12 DIAGNOSIS — Z22322 Carrier or suspected carrier of Methicillin resistant Staphylococcus aureus: Secondary | ICD-10-CM

## 2019-09-12 LAB — CBC
HCT: 31.3 % — ABNORMAL LOW (ref 36.0–46.0)
Hemoglobin: 10.5 g/dL — ABNORMAL LOW (ref 12.0–15.0)
MCH: 28 pg (ref 26.0–34.0)
MCHC: 33.5 g/dL (ref 30.0–36.0)
MCV: 83.5 fL (ref 80.0–100.0)
Platelets: 208 10*3/uL (ref 150–400)
RBC: 3.75 MIL/uL — ABNORMAL LOW (ref 3.87–5.11)
RDW: 18.3 % — ABNORMAL HIGH (ref 11.5–15.5)
WBC: 6.9 10*3/uL (ref 4.0–10.5)
nRBC: 0 % (ref 0.0–0.2)

## 2019-09-12 LAB — TROPONIN I (HIGH SENSITIVITY)
Troponin I (High Sensitivity): 12 ng/L (ref <18)
Troponin I (High Sensitivity): 10 ng/L (ref <18)

## 2019-09-12 LAB — MAGNESIUM: Magnesium: 1.6 mg/dL — ABNORMAL LOW (ref 1.7–2.4)

## 2019-09-12 LAB — BASIC METABOLIC PANEL
Anion gap: 11 (ref 5–15)
BUN: 11 mg/dL (ref 8–23)
CO2: 21 mmol/L — ABNORMAL LOW (ref 22–32)
Calcium: 8.5 mg/dL — ABNORMAL LOW (ref 8.9–10.3)
Chloride: 98 mmol/L (ref 98–111)
Creatinine, Ser: 1.07 mg/dL — ABNORMAL HIGH (ref 0.44–1.00)
GFR calc Af Amer: >60 mL/min (ref >60)
GFR calc non Af Amer: 56 mL/min — ABNORMAL LOW (ref >60)
Glucose, Bld: 92 mg/dL (ref 70–99)
Potassium: 3 mmol/L — ABNORMAL LOW (ref 3.5–5.1)
Sodium: 130 mmol/L — ABNORMAL LOW (ref 135–145)

## 2019-09-12 LAB — BRAIN NATRIURETIC PEPTIDE: B Natriuretic Peptide: 37 pg/mL (ref 0.0–100.0)

## 2019-09-12 IMAGING — DX DG HUMERUS 2V *R*
2 series · 2 of 2 positions shown · non-contrast
Comparison: None.

CLINICAL DATA: Lytic lesion of the bone

EXAM:
RIGHT HUMERUS - 2+ VIEW

[humerus ap]
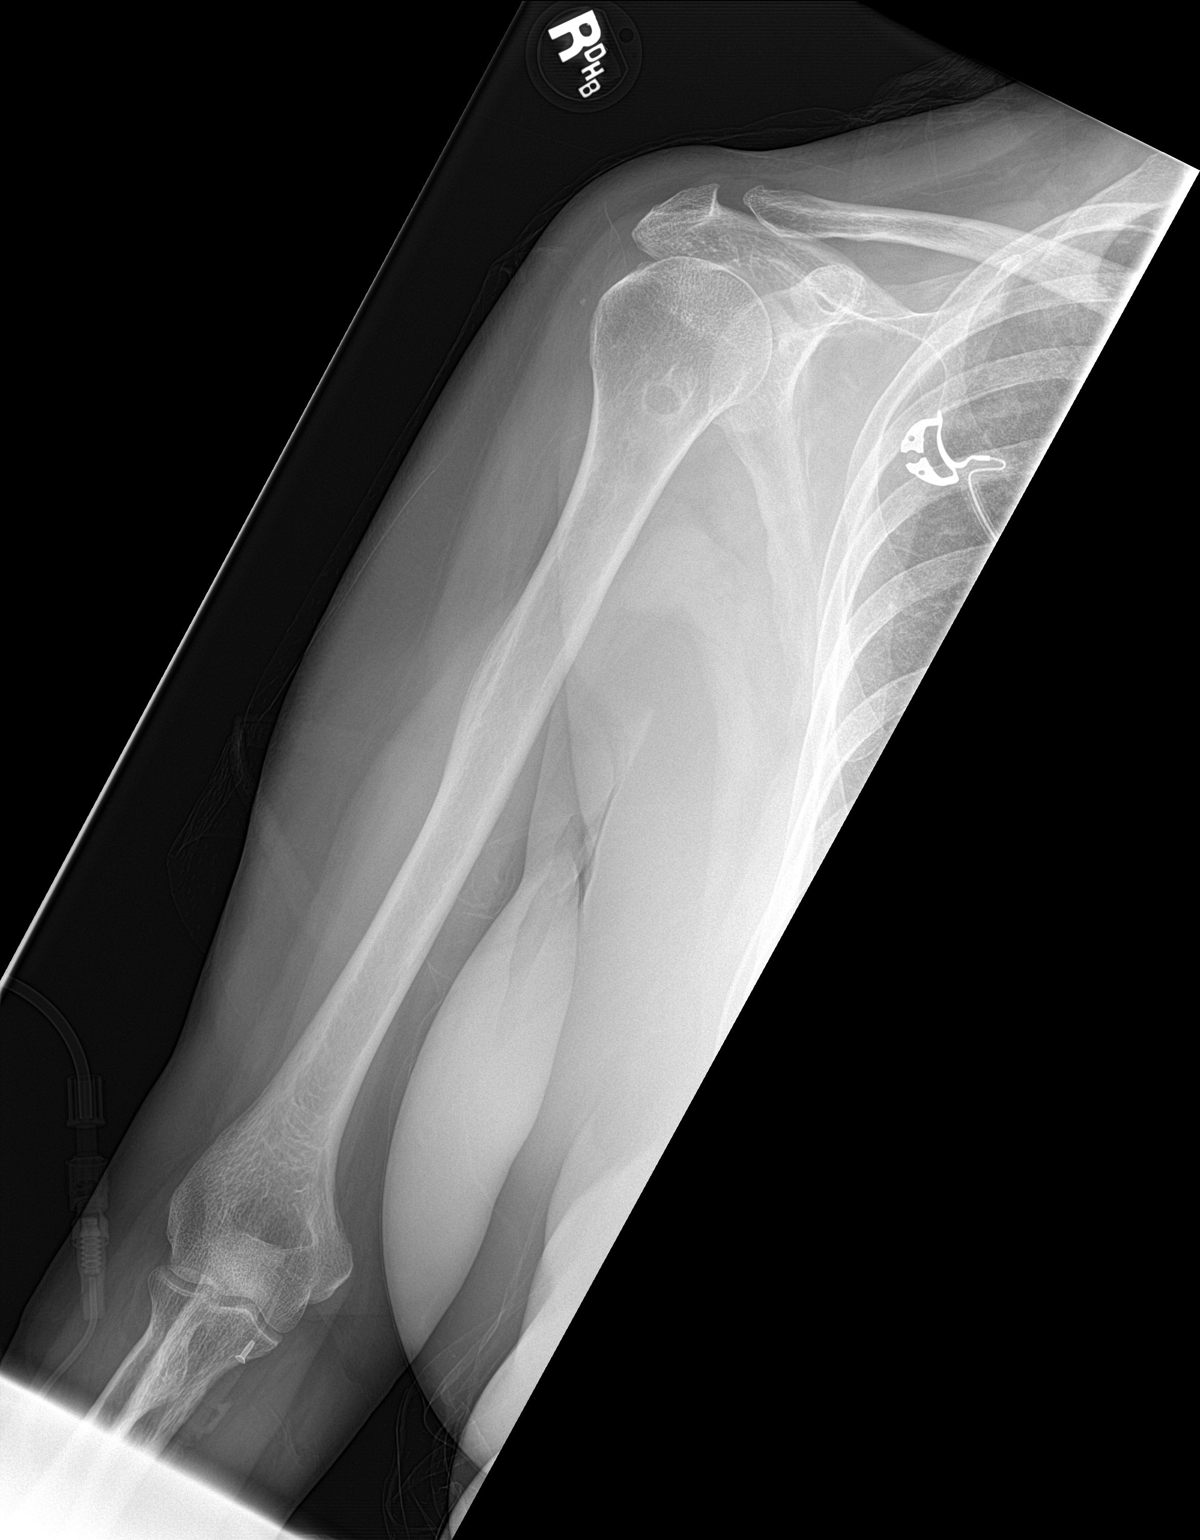

[humerus lat]
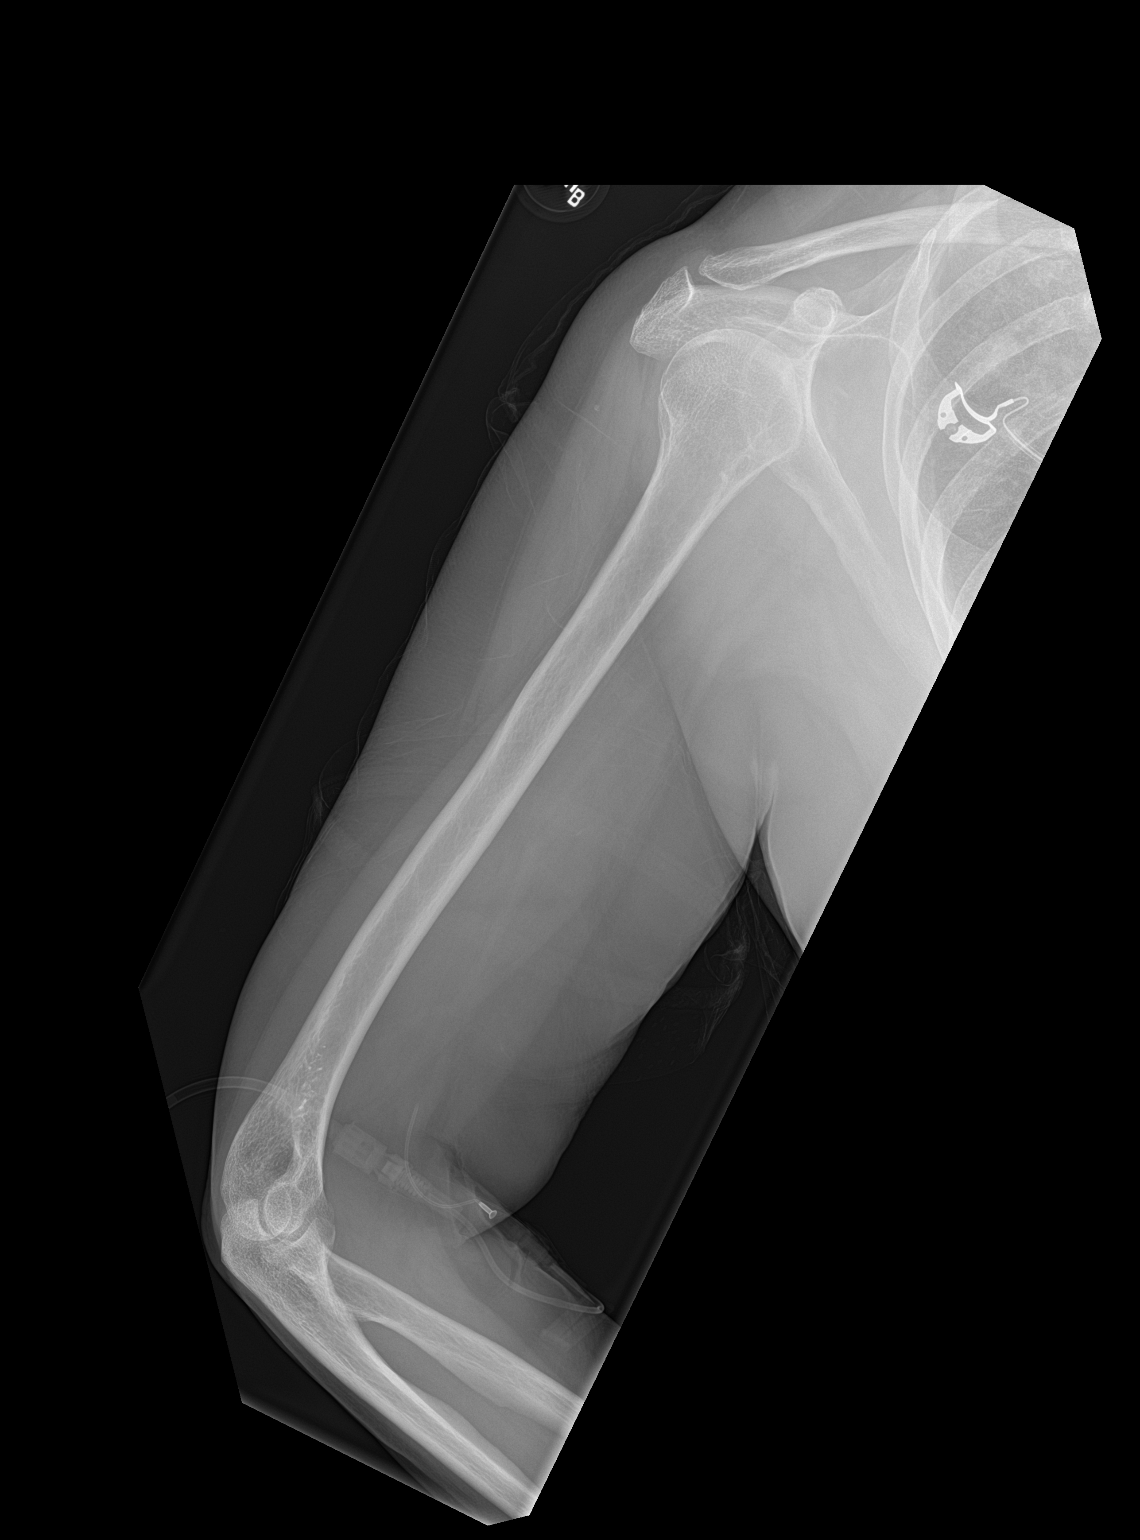

[2 of 2 positions shown; findings below may reference images not displayed]

FINDINGS: There is a small 1 cm well circumscribed lucent lesion seen within
the proximal humerus which appears to be present on exam dating back
to to [DATE]. No associated cortical breakthrough or
periosteal reaction. No new osseous lesions are seen.
IMPRESSION: 1 cm well-circumscribed lucent lesion within the proximal humerus
dating back to [JB], likely benign intraosseous cyst

## 2019-09-12 IMAGING — DX DG CHEST 1V PORT
1 series · 1 of 1 positions shown · non-contrast
Comparison: Chest radiograph [DATE].

CLINICAL DATA: Patient with low oxygen saturation.

EXAM:
PORTABLE CHEST 1 VIEW

[chest ap]
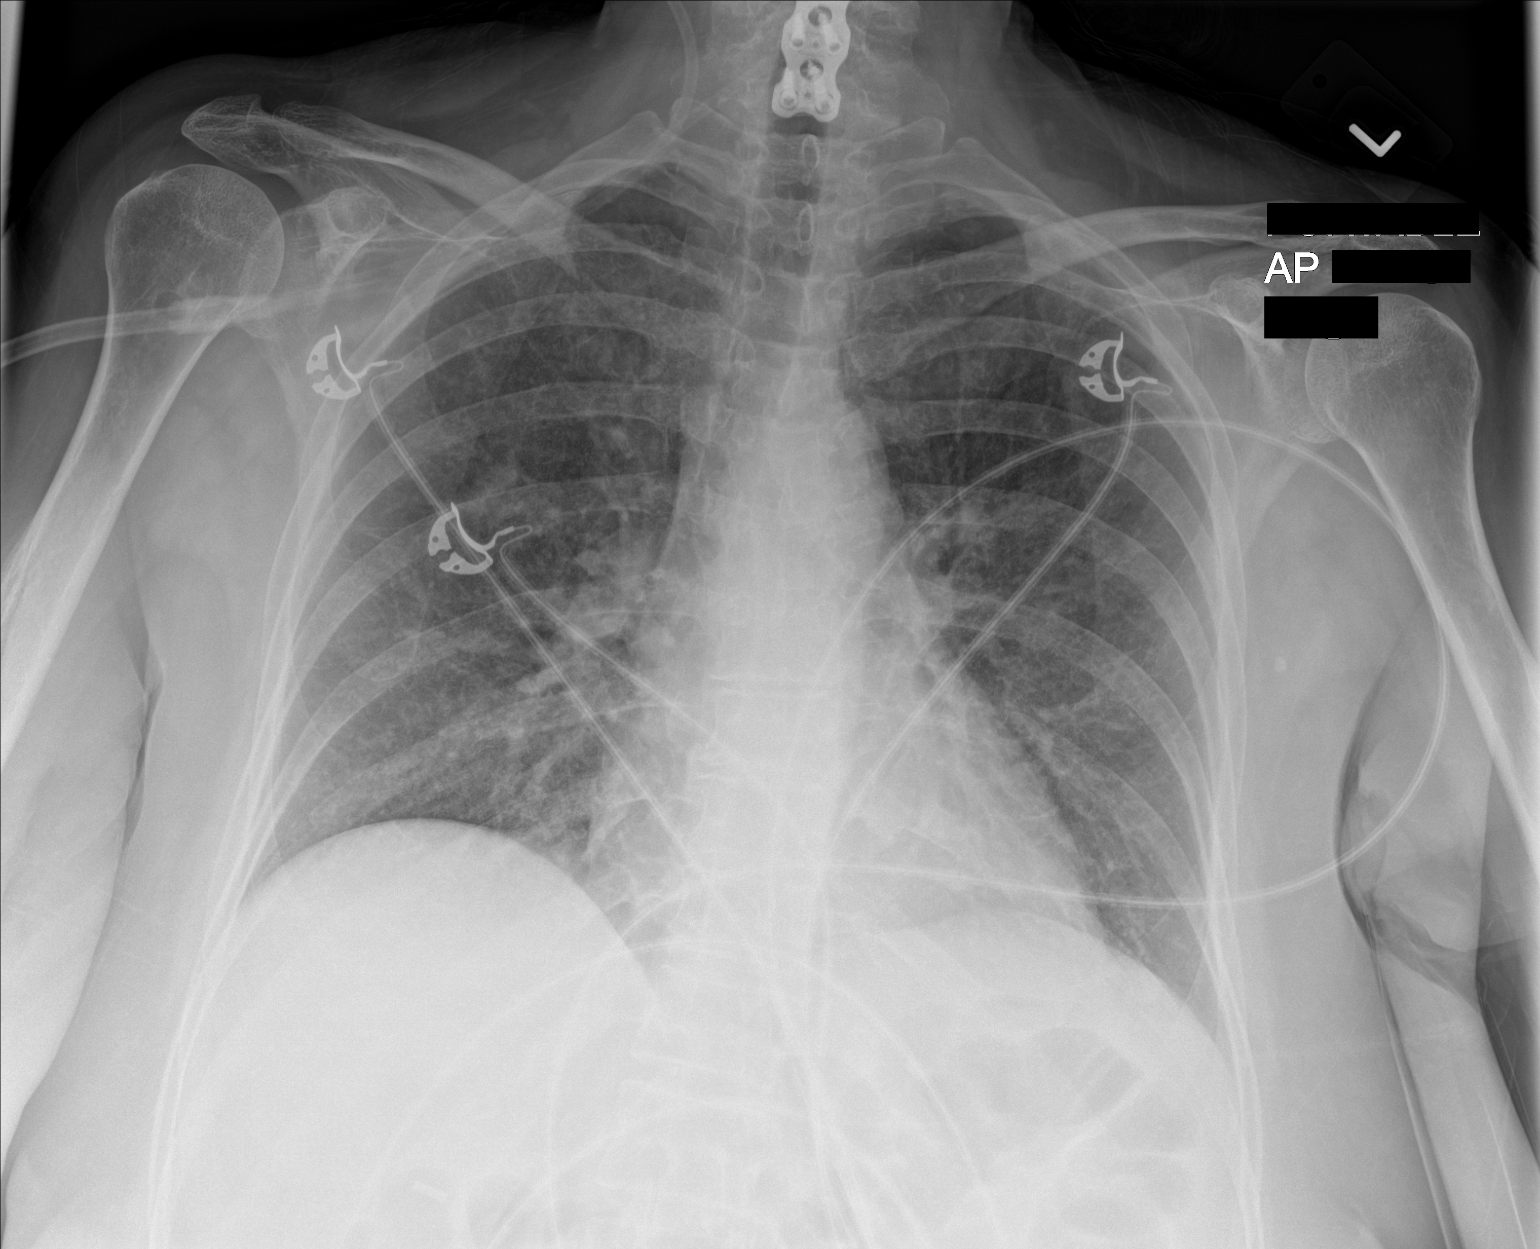

[1 of 1 positions shown; findings below may reference images not displayed]

FINDINGS: Monitoring leads overlie the patient. Patchy consolidative opacity
within the right mid lung. No pleural effusion or pneumothorax.
Osseous structures unremarkable.
IMPRESSION: Patchy right midlung opacity may represent pneumonia in the
appropriate clinical setting. Followup PA and lateral chest X-ray is
recommended in 3-4 weeks following trial of antibiotic therapy to
ensure resolution and exclude underlying malignancy.

## 2019-09-12 MED ORDER — SODIUM CHLORIDE 0.9% FLUSH: 3.0000 mL | Freq: Once | INTRAVENOUS | Status: DC

## 2019-09-12 MED ORDER — SODIUM CHLORIDE 0.9 % IV SOLN
500.0000 mg | Freq: Once | INTRAVENOUS | Status: AC
  Administered 2019-09-12: 500 mg via INTRAVENOUS
  Filled 2019-09-12: qty 500

## 2019-09-12 MED ORDER — SODIUM CHLORIDE 0.9 % IV SOLN
1.0000 g | Freq: Once | INTRAVENOUS | Status: AC
  Administered 2019-09-12: 1 g via INTRAVENOUS
  Filled 2019-09-12: qty 10

## 2019-09-12 MED ORDER — POTASSIUM CHLORIDE CRYS ER 20 MEQ PO TBCR
40.0000 meq | EXTENDED_RELEASE_TABLET | Freq: Once | ORAL | Status: AC
  Administered 2019-09-12: 40 meq via ORAL
  Filled 2019-09-12: qty 2

## 2019-09-12 MED ORDER — POTASSIUM CHLORIDE CRYS ER 20 MEQ PO TBCR: 40.0000 meq | EXTENDED_RELEASE_TABLET | ORAL | Status: DC

## 2019-09-12 MED ORDER — MAGNESIUM SULFATE 50 % IJ SOLN
1.0000 g | Freq: Once | INTRAVENOUS | Status: AC
  Administered 2019-09-13: 1 g via INTRAVENOUS
  Filled 2019-09-12: qty 2

## 2019-09-12 MED ORDER — VANCOMYCIN HCL IN DEXTROSE 1-5 GM/200ML-% IV SOLN
1000.0000 mg | Freq: Once | INTRAVENOUS | Status: AC
  Administered 2019-09-13: 1000 mg via INTRAVENOUS
  Filled 2019-09-12: qty 200

## 2019-09-12 MED ORDER — PILOCARPINE HCL 5 MG PO TABS
5.0000 mg | ORAL_TABLET | Freq: Two times a day (BID) | ORAL | Status: DC
  Administered 2019-09-12 – 2019-09-16 (×8): 5 mg via ORAL
  Filled 2019-09-12 (×13): qty 1

## 2019-09-12 NOTE — H&P (Signed)
PCP:   Oletha Blend, MD  Rheum:   Chief Complaint: Weakness.   HPI: [name] who presents with complaint of shortness of breath and fever of 103 at home.  Since Saturday the patient has not been feeling well.  She has been tired and febrile and just generally worn out.  She went to [facility] on Monday and was diagnosed with pneumonia and a UTI.  She was prescribed a Z-Pak and amoxicillin and discharged home.  She did receive IV Rocephin.  She has been taking antibiotic.  She states the Z-Pak does cause her to have diarrhea.  However despite taking the antibiotic she states she continues to run fever and remains very worn out.  She also shortness of breath.  Her son brought her pulse ox and states that at one point her oxygen sats were in the 70s.  Here in the ER she is in the 80s.  The patient reports shortness of breath with any activity.  She reports a dry nonproductive cough and wheezing.  She has a history of migraine but states she had a bad headache since all this started.  She reports some dizziness.  Per her son she has had some mild confusion mood fever.  She had a T-max of 103 at home.  She does report nausea but as that she has a history of IBS.  Her son insisted and brought her to the ER.  ?lytic lesion on right shoulder    Review of Systems:  The patient denies anorexia, fever, weight loss,, vision loss, nausea, cough, SOB, wheeze, weakness,  decreased hearing, hoarseness, chest pain, syncope, dyspnea on exertion, peripheral edema, balance deficits, hemoptysis, abdominal pain, melena, hematochezia, severe indigestion/heartburn, hematuria, incontinence, genital sores, muscle weakness, suspicious skin lesions, transient blindness, difficulty walking, depression, unusual weight change, abnormal bleeding, enlarged lymph nodes, angioedema, and breast masses.  Past Medical History: Past Medical History:  Diagnosis Date  . Arthritis   . Family history of ovarian cancer  12/2016   MyRisk neg  . Fibromyalgia   . Migraine   . Neuropathy   . Placenta previa   . Sjogren's disease [facility])    Past Surgical History:  Procedure Laterality Date  . CERVICAL FUSION    . CESAREAN SECTION    . CHOLECYSTECTOMY    . HERNIA REPAIR    . REPLACEMENT TOTAL KNEE Left   . SHOULDER SURGERY Right     Medications: Prior to Admission medications   Medication Sig Start Date End Date Taking? Authorizing Provider  aspirin-acetaminophen-caffeine (EXCEDRIN MIGRAINE) [phone] MG tablet Take by mouth.    [provider]  butalbital-acetaminophen-caffeine (FIORICET, ESGIC) 50-325-40 MG tablet Take 1 tablet by mouth every 6 (six) hours as needed for headache.    [provider]  clobetasol cream (TEMOVATE) 0.05 % Apply topically. 09/07/17   [provider]  cyanocobalamin 1000 MCG tablet Take 1,000 mcg by mouth daily.    [provider]  esomeprazole (NEXIUM) 40 MG capsule Take 40 mg by mouth daily at 12 noon.    [provider]  fexofenadine (ALLEGRA) 180 MG tablet Take 180 mg by mouth daily.    [provider]  folic acid (FOLVITE) 1 MG tablet Take 1 mg by mouth daily. 08/16/19   [provider]  gabapentin (NEURONTIN) 300 MG capsule Take 900 mg by mouth at bedtime.    [provider]  hydrochlorothiazide (HYDRODIURIL) 25 MG tablet Take by mouth. 07/27/17 09/02/19  [provider]  hydrOXYzine (ATARAX/VISTARIL) 10 MG tablet Take 10 mg by mouth every 8 (eight) hours as needed.    [provider]  hyoscyamine (LEVSIN, ANASPAZ) 0.125 MG tablet Take 0.125 mg by mouth every 4 (four) hours as needed.    [provider]  methocarbamol (ROBAXIN) 500 MG tablet Take by mouth. 08/22/17   [provider]  methotrexate (RHEUMATREX) 2.5 MG tablet TAKE 6 TABLETS BY MOUTH EVERY 7 DAYS(ONE DAY PER WEEK) 02/08/18   [provider]  rizatriptan (MAXALT) 10 MG tablet Take 10 mg by mouth as  needed for migraine. May repeat in 2 hours if needed    [provider]  rOPINIRole (REQUIP) 0.5 MG tablet Take 1 tablet by mouth daily. 07/26/19 07/25/20  [provider]  topiramate (TOPAMAX) 100 MG tablet Take 200 mg by mouth daily.    [provider]  traZODone (DESYREL) 50 MG tablet Take 1 tablet by mouth daily as needed. 12/07/17   [provider]  valACYclovir (VALTREX) 1000 MG tablet Take 1 tablet once a day by mouth 07/14/16   [provider]  VITAMIN D, CHOLECALCIFEROL, PO Take by mouth.    [provider]    Allergies:   Allergies  Allergen Reactions  . Hydrocodone-Acetaminophen     Other reaction(s): Other (See Comments) Severe headache  . Hydrocodone-Acetaminophen     Other reaction(s): Headache, Other (See Comments) Severe headache Severe headache  Other reaction(s): Headache Severe headache Other reaction(s): Headache, Other (See Comments) Severe headache Severe headache  . Morphine Itching and Other (See Comments)    Other reaction(s): Other (See Comments) Morphine in high doses causes itching & red face. Morphine in high doses causes itching & red face.  Morphine in high doses causes itching & red face. Other reaction(s): Other (See Comments) Morphine in high doses causes itching & red face. Morphine in high doses causes itching & red face. Morphine in high doses causes itching & red face.  . Morphine And Related Itching    Morphine in high doses causes itching & red face.   Marland Kitchen Cymbalta [Duloxetine Hcl] Other (See Comments)    Serotonin Reaction  . Duloxetine Other (See Comments)    Other reaction(s): Other (See Comments), Other (See Comments) Serotonin syndrome Serotonin reaction  Serotonin syndrome Other reaction(s): Other (See Comments), Other (See Comments) Serotonin syndrome Serotonin reaction  . Pregabalin Other (See Comments)    Other reaction(s): Other (See Comments) Serotinon  reaction serotinon reaction  Serotinon reaction Other reaction(s): Other (See Comments) Serotinon reaction serotinon reaction    Social History:  reports that she has never smoked. She has never used smokeless tobacco. She reports previous alcohol use. She reports that she does not use drugs.  Family History: Family History  Problem Relation Age of Onset  . Ovarian cancer Maternal Aunt 30  . Uterine cancer Mother        54s  . COPD Mother   . Hypertension Mother   . Uterine cancer Maternal Grandmother        ? age  . Heart attack Father 33  . Other Father        DDD    Physical Exam: Vitals:   09/12/19 1834 09/12/19 1900 09/12/19 1930  BP: 136/74 140/88 (!) 146/67  Pulse: 88 95 89  Resp: 19 (!) 24 20  Temp: 99.1 F (37.3 C)    SpO2: (!) 88% 98% 100%  Weight: 82.6 kg    Height: 5' 2.5" (1.588 m)  General:  Alert and oriented times three, well developed and nourished, no acute distress, weak appearing Eyes: PERRLA, pink conjunctiva, no scleral icterus ENT: Moist oral mucosa, neck supple, no thyromegaly Lungs: clear to ascultation, no wheeze, no crackles, no use of accessory muscles Cardiovascular: regular rate and rhythm, no regurgitation, no gallops, no murmurs. No carotid bruits, no JVD Abdomen: soft, positive BS, non-tender, non-distended, no organomegaly, not an acute abdomen GU: not examined Neuro: CN II - XII grossly intact, sensation intact Musculoskeletal: strength 5/5 all extremities, no clubbing, cyanosis or edema Skin: no rash, no subcutaneous crepitation, no decubitus Psych: appropriate patient   Labs on Admission:  No results for input(s): NA, K, CL, CO2, GLUCOSE, BUN, CREATININE, CALCIUM, MG, PHOS in the last 72 hours. No results for input(s): AST, ALT, ALKPHOS, BILITOT, PROT, ALBUMIN in the last 72 hours. No results for input(s): LIPASE, AMYLASE in the last 72 hours. Recent Labs    09/12/19 1945  WBC 6.9  HGB 10.5*  HCT 31.3*  MCV 83.5   PLT 208   No results for input(s): CKTOTAL, CKMB, CKMBINDEX, TROPONINI in the last 72 hours. Invalid input(s): POCBNP No results for input(s): DDIMER in the last 72 hours. No results for input(s): HGBA1C in the last 72 hours. No results for input(s): CHOL, HDL, LDLCALC, TRIG, CHOLHDL, LDLDIRECT in the last 72 hours. No results for input(s): TSH, T4TOTAL, T3FREE, THYROIDAB in the last 72 hours.  Invalid input(s): FREET3 No results for input(s): VITAMINB12, FOLATE, FERRITIN, TIBC, IRON, RETICCTPCT in the last 72 hours.  Micro Results: No results found for this or any previous visit (from the past 240 hour(s)).   Radiological Exams on Admission: DG Chest Portable 1 View  Result Date: 09/12/2019 CLINICAL DATA:  Patient with low oxygen saturation. EXAM: PORTABLE CHEST 1 VIEW COMPARISON:  Chest radiograph 02/23/2018. FINDINGS: Monitoring leads overlie the patient. Patchy consolidative opacity within the right mid lung. No pleural effusion or pneumothorax. Osseous structures unremarkable. IMPRESSION: Patchy right midlung opacity may represent pneumonia in the appropriate clinical setting. Followup PA and lateral chest X-ray is recommended in 3-4 weeks following trial of antibiotic therapy to ensure resolution and exclude underlying malignancy. Electronically Signed   By: Lovey Newcomer M.D.   On: 09/12/2019 19:32    Assessment/Plan Present on Admission: . Community acquired pneumonia . Hypoxemia/immunocompromised patient -Admit to MedSurg -Blood cultures collected -IV cefepime and vancomycin ordered -Oxygen to keep sats greater than 88% -Nebulizers as needed, respiratory to evaluate and treat -Patient methotrexate will be held  . Hypokalemia -Replete n.p.o.  Magnesium level added, BMP in a.m.  . Benign essential hypertension -Stable, home meds resumed  Sjogren's syndrome  -Stable, for now placed on methotrexate held.  Pilocarpine continued  Migraines -Home meds  resumed  Fibromyalgia -  Question of lytic lesion on chest x-ray right humerus -We will order some dedicated imaging  Amy Huynh 09/12/2019, 8:33 PM

## 2019-09-12 NOTE — ED Triage Notes (Signed)
Pt comes via POV from home with c/o fever, low O2 sats and dx with pneumonia.  Current O2-88% RA. HR-113  Pt states she was seen at [facility] and they were going to admit her but didn't. Pt states she isn't getting better.  Pt placed on 2L [place] at this time.

## 2019-09-12 NOTE — ED Provider Notes (Signed)
[facility] Emergency Department Provider Note  Time seen: 8:00 PM  I have reviewed the triage vital signs and the nursing notes.   HISTORY  Chief Complaint Fever and Pneumonia   HPI Amy Huynh is a 64 y.o. female with a past medical history of fibromyalgia, migraines, Sjogren's, presents to the emergency department for worsening shortness of breath and fever.  According to the patient for the past several days she has been experiencing shortness of breath, was seen at [facility] 2 days ago and diagnosed with pneumonia.  Patient ultimately was discharged home however states she is continued to feel short of breath and has now become febrile to 103 at home.  They have a home O2 pulse oximeter that was reading 78% so they came to the emergency department.  Here the patient is satting 87% on room air.  Patient started on 2 L nasal cannula.  Denies any chest pain but does state shortness of breath does have a cough and has had a fever.  Patient was tested at [facility] with a negative Covid PCR as well as a CT scan of her chest showing likely pneumonia.   Past Medical History:  Diagnosis Date  . Arthritis   . Family history of ovarian cancer 12/2016   MyRisk neg  . Fibromyalgia   . Migraine   . Neuropathy   . Placenta previa   . Sjogren's disease [facility])     Patient Active Problem List   Diagnosis Date Noted  . Family history of ovarian cancer 01/11/2017  . Acute bursitis of right shoulder 06/07/2016  . Partial tear of right rotator cuff 06/07/2016  . Lumbar radiculopathy 07/23/2015  . Migraine without aura and without status migrainosus, not intractable 04/23/2015  . Chronic daily headache 04/23/2015  . DDD (degenerative disc disease), cervical 07/06/2014  . Neck pain 07/06/2014  . Dyspareunia 12/14/2013  . Benign essential hypertension 09/13/2013  . Polyarthralgia 07/20/2013  . Diverticula of colon 01/22/2013  . Fibromyalgia 01/22/2013  . Restless legs  syndrome 01/22/2013  . Moderate obstructive sleep apnea 01/22/2013  . Migraine headache 01/22/2013  . Insomnia 01/22/2013  . Generalized anxiety disorder 01/22/2013  . Esophageal reflux 01/22/2013  . Atrophic vaginitis 01/22/2013  . Anemia 01/22/2013  . Allergic rhinitis 01/22/2013  . Urticaria 01/18/2013  . Low back pain 10/03/2012  . Lumbosacral spondylosis 08/17/2012    Past Surgical History:  Procedure Laterality Date  . CERVICAL FUSION    . CESAREAN SECTION    . CHOLECYSTECTOMY    . HERNIA REPAIR    . REPLACEMENT TOTAL KNEE Left   . SHOULDER SURGERY Right     Prior to Admission medications   Medication Sig Start Date End Date Taking? Authorizing Provider  aspirin-acetaminophen-caffeine (EXCEDRIN MIGRAINE) [phone] MG tablet Take by mouth.    [provider]  butalbital-acetaminophen-caffeine (FIORICET, ESGIC) 50-325-40 MG tablet Take 1 tablet by mouth every 6 (six) hours as needed for headache.    [provider]  clobetasol cream (TEMOVATE) 0.05 % Apply topically. 09/07/17   [provider]  cyanocobalamin 1000 MCG tablet Take 1,000 mcg by mouth daily.    [provider]  esomeprazole (NEXIUM) 40 MG capsule Take 40 mg by mouth daily at 12 noon.    [provider]  fexofenadine (ALLEGRA) 180 MG tablet Take 180 mg by mouth daily.    [provider]  folic acid (FOLVITE) 1 MG tablet Take 1 mg by mouth daily. 08/16/19   [provider]  gabapentin (NEURONTIN) 300 MG capsule Take 900 mg by mouth at bedtime.    [provider]  hydrochlorothiazide (HYDRODIURIL) 25 MG tablet Take by mouth. 07/27/17 09/02/19  [provider]  hydrOXYzine (ATARAX/VISTARIL) 10 MG tablet Take 10 mg by mouth every 8 (eight) hours as needed.    [provider]  hyoscyamine (LEVSIN, ANASPAZ) 0.125 MG tablet Take 0.125 mg by mouth every 4 (four) hours as needed.    [provider]  methocarbamol (ROBAXIN) 500  MG tablet Take by mouth. 08/22/17   [provider]  methotrexate (RHEUMATREX) 2.5 MG tablet TAKE 6 TABLETS BY MOUTH EVERY 7 DAYS(ONE DAY PER WEEK) 02/08/18   [provider]  rizatriptan (MAXALT) 10 MG tablet Take 10 mg by mouth as needed for migraine. May repeat in 2 hours if needed    [provider]  rOPINIRole (REQUIP) 0.5 MG tablet Take 1 tablet by mouth daily. 07/26/19 07/25/20  [provider]  topiramate (TOPAMAX) 100 MG tablet Take 200 mg by mouth daily.    [provider]  traZODone (DESYREL) 50 MG tablet Take 1 tablet by mouth daily as needed. 12/07/17   [provider]  valACYclovir (VALTREX) 1000 MG tablet Take 1 tablet once a day by mouth 07/14/16   [provider]  VITAMIN D, CHOLECALCIFEROL, PO Take by mouth.    [provider]    Allergies  Allergen Reactions  . Hydrocodone-Acetaminophen     Other reaction(s): Other (See Comments) Severe headache  . Hydrocodone-Acetaminophen     Other reaction(s): Headache, Other (See Comments) Severe headache Severe headache  Other reaction(s): Headache Severe headache Other reaction(s): Headache, Other (See Comments) Severe headache Severe headache  . Morphine Itching and Other (See Comments)    Other reaction(s): Other (See Comments) Morphine in high doses causes itching & red face. Morphine in high doses causes itching & red face.  Morphine in high doses causes itching & red face. Other reaction(s): Other (See Comments) Morphine in high doses causes itching & red face. Morphine in high doses causes itching & red face. Morphine in high doses causes itching & red face.  . Morphine And Related Itching    Morphine in high doses causes itching & red face.   Marland Kitchen Cymbalta [Duloxetine Hcl] Other (See Comments)    Serotonin Reaction  . Duloxetine Other (See Comments)    Other reaction(s): Other (See Comments), Other (See Comments) Serotonin syndrome Serotonin  reaction  Serotonin syndrome Other reaction(s): Other (See Comments), Other (See Comments) Serotonin syndrome Serotonin reaction  . Pregabalin Other (See Comments)    Other reaction(s): Other (See Comments) Serotinon reaction serotinon reaction  Serotinon reaction Other reaction(s): Other (See Comments) Serotinon reaction serotinon reaction    Family History  Problem Relation Age of Onset  . Ovarian cancer Maternal Aunt 30  . Uterine cancer Mother        52s  . COPD Mother   . Hypertension Mother   . Uterine cancer Maternal Grandmother        ? age  . Heart attack Father 57  . Other Father        DDD    Social History Social History   Tobacco Use  . Smoking status: Never Smoker  . Smokeless tobacco: Never Used  Substance Use Topics  . Alcohol use: Not Currently    Comment: social  . Drug use: Never    Review of Systems Constitutional: Fever to 103. Cardiovascular: Negative for chest pain.  Respiratory: Positive for shortness of breath.  Positive for cough. Gastrointestinal: Negative for abdominal pain, vomiting Musculoskeletal: Negative for musculoskeletal complaints Neurological: Negative for headache All other ROS negative  ____________________________________________   PHYSICAL EXAM:  VITAL SIGNS: ED Triage Vitals [09/12/19 1834]  Enc Vitals Group     BP 136/74     Pulse Rate 88     Resp 19     Temp 99.1 F (37.3 C)     Temp src      SpO2 (!) 88 %     Weight 182 lb (82.6 kg)     Height 5' 2.5" (1.588 m)     Head Circumference      Peak Flow      Pain Score 6     Pain Loc      Pain Edu?      Excl. in [place]?     Constitutional: Alert and oriented. Well appearing and in no distress. Eyes: Normal exam ENT      Head: Normocephalic and atraumatic.      Mouth/Throat: Mucous membranes are moist. Cardiovascular: Normal rate, regular rhythm around 100 bpm..  Respiratory: Normal respiratory effort without tachypnea nor retractions. Breath sounds  are clear  Gastrointestinal: Soft and nontender. No distention.   Musculoskeletal: Nontender with normal range of motion in all extremities.  Neurologic:  Normal speech and language. No gross focal neurologic deficits Skin:  Skin is warm, dry and intact.  Psychiatric: Mood and affect are normal.   ____________________________________________    EKG  EKG viewed and interpreted by myself shows sinus tachycardia 103 bpm with a narrow QRS, normal axis, normal intervals besides QTC prolongation however on my review of the EKG QT appears normal.  No concerning ST changes.  ____________________________________________    RADIOLOGY  Right-sided pneumonia  ____________________________________________   INITIAL IMPRESSION / ASSESSMENT AND PLAN / ED COURSE  Pertinent labs & imaging results that were available during my care of the patient were reviewed by me and considered in my medical decision making (see chart for details).   Patient presents to the emergency department for worsening shortness of breath cough and fever to 103.  Chest x-ray shows likely right-sided pneumonia which is consistent with the patient's clinical picture.  Patient had a negative PCR test [facility] performed 2 days ago we will repeat a Covid test as a precaution.  We will start the patient on antibiotics to cover for community-acquired pneumonia.  Labs are pending.  We will admit to the hospital service once ED work-up is completed.  Amy Huynh was evaluated in Emergency Department on 09/12/2019 for the symptoms described in the history of present illness. She was evaluated in the context of the global COVID-19 pandemic, which necessitated consideration that the patient might be at risk for infection with the SARS-CoV-2 virus that causes COVID-19. Institutional protocols and algorithms that pertain to the evaluation of patients at risk for COVID-19 are in a state of rapid change based on information released by  regulatory bodies including the CDC and federal and state organizations. These policies and algorithms were followed during the patient's care in the ED.  ____________________________________________   FINAL CLINICAL IMPRESSION(S) / ED DIAGNOSES  Right-sided pneumonia Community-acquired pneumonia   Harvest Dark, MD 09/12/19 2003

## 2019-09-12 NOTE — ED Notes (Signed)
Rn at bedside

## 2019-09-12 NOTE — ED Notes (Signed)
Holding antibiotics until lab can draw cultures per MD

## 2019-09-13 ENCOUNTER — Encounter: Payer: Self-pay | Admitting: Family Medicine

## 2019-09-13 ENCOUNTER — Inpatient Hospital Stay
Admit: 2019-09-13 | Discharge: 2019-09-13 | Disposition: A | Discharge status: Home / Self Care | Payer: Medicare PPO | Attending: Internal Medicine | Admitting: Internal Medicine

## 2019-09-13 DIAGNOSIS — J9601 Acute respiratory failure with hypoxia: Secondary | ICD-10-CM

## 2019-09-13 LAB — BASIC METABOLIC PANEL
Anion gap: 9 (ref 5–15)
BUN: 9 mg/dL (ref 8–23)
CO2: 24 mmol/L (ref 22–32)
Calcium: 8.6 mg/dL — ABNORMAL LOW (ref 8.9–10.3)
Chloride: 104 mmol/L (ref 98–111)
Creatinine, Ser: 0.81 mg/dL (ref 0.44–1.00)
GFR calc Af Amer: >60 mL/min (ref >60)
GFR calc non Af Amer: >60 mL/min (ref >60)
Glucose, Bld: 94 mg/dL (ref 70–99)
Potassium: 3.3 mmol/L — ABNORMAL LOW (ref 3.5–5.1)
Sodium: 137 mmol/L (ref 135–145)

## 2019-09-13 LAB — STREP PNEUMONIAE URINARY ANTIGEN: Strep Pneumo Urinary Antigen: NEGATIVE

## 2019-09-13 LAB — CBC
HCT: 34.1 % — ABNORMAL LOW (ref 36.0–46.0)
Hemoglobin: 11.3 g/dL — ABNORMAL LOW (ref 12.0–15.0)
MCH: 28.1 pg (ref 26.0–34.0)
MCHC: 33.1 g/dL (ref 30.0–36.0)
MCV: 84.8 fL (ref 80.0–100.0)
Platelets: 205 10*3/uL (ref 150–400)
RBC: 4.02 MIL/uL (ref 3.87–5.11)
RDW: 18.3 % — ABNORMAL HIGH (ref 11.5–15.5)
WBC: 5.4 10*3/uL (ref 4.0–10.5)
nRBC: 0 % (ref 0.0–0.2)

## 2019-09-13 LAB — MAGNESIUM: Magnesium: 2.2 mg/dL (ref 1.7–2.4)

## 2019-09-13 LAB — ECHOCARDIOGRAM COMPLETE
Height: 62.5 in
Weight: 3008.84 oz

## 2019-09-13 LAB — HIV ANTIBODY (ROUTINE TESTING W REFLEX): HIV Screen 4th Generation wRfx: NONREACTIVE

## 2019-09-13 LAB — MRSA PCR SCREENING: MRSA by PCR: POSITIVE — AB

## 2019-09-13 MED ORDER — ROPINIROLE HCL 0.25 MG PO TABS
0.5000 mg | ORAL_TABLET | Freq: Every day | ORAL | Status: DC
  Administered 2019-09-13 – 2019-09-15 (×3): 0.5 mg via ORAL
  Filled 2019-09-13 (×5): qty 2

## 2019-09-13 MED ORDER — METHYLPREDNISOLONE SODIUM SUCC 40 MG IJ SOLR
40.0000 mg | Freq: Two times a day (BID) | INTRAMUSCULAR | Status: DC
  Administered 2019-09-13 – 2019-09-16 (×7): 40 mg via INTRAVENOUS
  Filled 2019-09-13 (×7): qty 1

## 2019-09-13 MED ORDER — MUPIROCIN 2 % EX OINT
1.0000 "application " | TOPICAL_OINTMENT | Freq: Two times a day (BID) | CUTANEOUS | Status: DC
  Administered 2019-09-13 – 2019-09-16 (×6): 1 via NASAL
  Filled 2019-09-13: qty 22

## 2019-09-13 MED ORDER — HYOSCYAMINE SULFATE 0.125 MG PO TBDP
0.1250 mg | ORAL_TABLET | ORAL | Status: DC | PRN
  Filled 2019-09-13: qty 1

## 2019-09-13 MED ORDER — SODIUM CHLORIDE 0.9 % IV SOLN
2.0000 g | Freq: Three times a day (TID) | INTRAVENOUS | Status: DC
  Administered 2019-09-13 – 2019-09-16 (×9): 2 g via INTRAVENOUS
  Filled 2019-09-13 (×12): qty 2

## 2019-09-13 MED ORDER — PANTOPRAZOLE SODIUM 40 MG PO TBEC
40.0000 mg | DELAYED_RELEASE_TABLET | Freq: Every day | ORAL | Status: DC
  Administered 2019-09-13 – 2019-09-16 (×4): 40 mg via ORAL
  Filled 2019-09-13 (×4): qty 1

## 2019-09-13 MED ORDER — GABAPENTIN 300 MG PO CAPS
900.0000 mg | ORAL_CAPSULE | Freq: Every day | ORAL | Status: DC
  Administered 2019-09-13 – 2019-09-15 (×3): 900 mg via ORAL
  Filled 2019-09-13 (×4): qty 3

## 2019-09-13 MED ORDER — ONDANSETRON HCL 4 MG/2ML IJ SOLN: 4.0000 mg | Freq: Four times a day (QID) | INTRAMUSCULAR | Status: DC | PRN

## 2019-09-13 MED ORDER — SODIUM CHLORIDE 0.9 % IV SOLN
1.0000 g | Freq: Three times a day (TID) | INTRAVENOUS | Status: DC
  Administered 2019-09-13: 1 g via INTRAVENOUS
  Filled 2019-09-13 (×4): qty 1

## 2019-09-13 MED ORDER — POLYETHYLENE GLYCOL 3350 17 G PO PACK: 17.0000 g | PACK | Freq: Every day | ORAL | Status: DC | PRN

## 2019-09-13 MED ORDER — ONDANSETRON HCL 4 MG PO TABS: 4.0000 mg | ORAL_TABLET | Freq: Four times a day (QID) | ORAL | Status: DC | PRN

## 2019-09-13 MED ORDER — FOLIC ACID 1 MG PO TABS
1.0000 mg | ORAL_TABLET | Freq: Every day | ORAL | Status: DC
  Administered 2019-09-13 – 2019-09-16 (×4): 1 mg via ORAL
  Filled 2019-09-13 (×5): qty 1

## 2019-09-13 MED ORDER — LORATADINE 10 MG PO TABS
10.0000 mg | ORAL_TABLET | Freq: Every day | ORAL | Status: DC
  Administered 2019-09-13 – 2019-09-16 (×4): 10 mg via ORAL
  Filled 2019-09-13 (×4): qty 1

## 2019-09-13 MED ORDER — GUAIFENESIN ER 600 MG PO TB12: 600.0000 mg | ORAL_TABLET | Freq: Two times a day (BID) | ORAL | Status: DC | PRN

## 2019-09-13 MED ORDER — ENOXAPARIN SODIUM 40 MG/0.4ML ~~LOC~~ SOLN
40.0000 mg | SUBCUTANEOUS | Status: DC
  Administered 2019-09-13 – 2019-09-16 (×4): 40 mg via SUBCUTANEOUS
  Filled 2019-09-13 (×4): qty 0.4

## 2019-09-13 MED ORDER — VANCOMYCIN HCL 1250 MG/250ML IV SOLN
1250.0000 mg | INTRAVENOUS | Status: DC
  Administered 2019-09-14: 1250 mg via INTRAVENOUS
  Filled 2019-09-13 (×2): qty 250

## 2019-09-13 MED ORDER — VANCOMYCIN HCL 750 MG/150ML IV SOLN
750.0000 mg | INTRAVENOUS | Status: DC
  Filled 2019-09-13: qty 150

## 2019-09-13 MED ORDER — HYDROCOD POLST-CPM POLST ER 10-8 MG/5ML PO SUER
5.0000 mL | Freq: Two times a day (BID) | ORAL | Status: DC
  Administered 2019-09-13 – 2019-09-16 (×7): 5 mL via ORAL
  Filled 2019-09-13 (×7): qty 5

## 2019-09-13 MED ORDER — ACETAMINOPHEN 325 MG PO TABS
650.0000 mg | ORAL_TABLET | Freq: Four times a day (QID) | ORAL | Status: DC | PRN
  Administered 2019-09-13 – 2019-09-16 (×6): 650 mg via ORAL
  Filled 2019-09-13 (×6): qty 2

## 2019-09-13 MED ORDER — ASPIRIN-ACETAMINOPHEN-CAFFEINE 250-250-65 MG PO TABS
2.0000 | ORAL_TABLET | Freq: Four times a day (QID) | ORAL | Status: DC | PRN
  Filled 2019-09-13: qty 2

## 2019-09-13 MED ORDER — ACETAMINOPHEN 650 MG RE SUPP: 650.0000 mg | Freq: Four times a day (QID) | RECTAL | Status: DC | PRN

## 2019-09-13 MED ORDER — CHLORHEXIDINE GLUCONATE CLOTH 2 % EX PADS
6.0000 | MEDICATED_PAD | Freq: Every day | CUTANEOUS | Status: DC
  Administered 2019-09-14 – 2019-09-16 (×3): 6 via TOPICAL

## 2019-09-13 MED ORDER — TOPIRAMATE 100 MG PO TABS
200.0000 mg | ORAL_TABLET | Freq: Every day | ORAL | Status: DC
  Administered 2019-09-13 – 2019-09-16 (×4): 200 mg via ORAL
  Filled 2019-09-13 (×4): qty 2

## 2019-09-13 MED ORDER — POTASSIUM CHLORIDE CRYS ER 20 MEQ PO TBCR
40.0000 meq | EXTENDED_RELEASE_TABLET | Freq: Once | ORAL | Status: AC
  Administered 2019-09-13: 40 meq via ORAL
  Filled 2019-09-13: qty 2

## 2019-09-13 MED ORDER — VITAMIN B-12 1000 MCG PO TABS
1000.0000 ug | ORAL_TABLET | Freq: Every day | ORAL | Status: DC
  Administered 2019-09-13 – 2019-09-16 (×4): 1000 ug via ORAL
  Filled 2019-09-13 (×4): qty 1

## 2019-09-13 MED ORDER — SACCHAROMYCES BOULARDII 250 MG PO CAPS
250.0000 mg | ORAL_CAPSULE | Freq: Two times a day (BID) | ORAL | Status: DC
  Administered 2019-09-13 – 2019-09-16 (×7): 250 mg via ORAL
  Filled 2019-09-13 (×8): qty 1

## 2019-09-13 MED ORDER — ALBUTEROL SULFATE (2.5 MG/3ML) 0.083% IN NEBU: 2.5000 mg | INHALATION_SOLUTION | RESPIRATORY_TRACT | Status: DC | PRN

## 2019-09-13 NOTE — Progress Notes (Signed)
Notified by lab pts MRSA PCR swab is positive. MD notified. MRSA protocol initated.

## 2019-09-13 NOTE — Progress Notes (Signed)
Pharmacy Antibiotic Note  Amy Huynh is a 64 y.o. female admitted on 09/12/2019 with pneumonia.  Pharmacy has been consulted for Vancomycin dosing.  -also on Cefepime -on methotrexate PTA  Plan: Vancomycin 750 mg IV Q 24 hrs. Goal AUC 400-550. Expected AUC: 442.8, Css min 11.3 SCr used: 1.07  4/29 Scr improved to 0.81. Will adjust Vancomycin to 1250 mg IV q24h. Expected AUC: 555, Css min 12.5 SCr used: 0.81    Height: 5' 2.5" (158.8 cm) Weight: 85.3 kg (188 lb 0.8 oz) IBW/kg (Calculated) : 51.25  Temp (24hrs), Avg:99 F (37.2 C), Min:98.5 F (36.9 C), Max:99.7 F (37.6 C)  Recent Labs  Lab 09/12/19 1945 09/13/19 0529  WBC 6.9 5.4  CREATININE 1.07* 0.81    Estimated Creatinine Clearance: 73.8 mL/min (by C-G formula based on SCr of 0.81 mg/dL).    Allergies  Allergen Reactions  . Hydrocodone-Acetaminophen     Other reaction(s): Other (See Comments) Severe headache  . Hydrocodone-Acetaminophen     Other reaction(s): Headache, Other (See Comments) Severe headache Severe headache  Other reaction(s): Headache Severe headache Other reaction(s): Headache, Other (See Comments) Severe headache Severe headache  . Morphine Itching and Other (See Comments)    Other reaction(s): Other (See Comments) Morphine in high doses causes itching & red face. Morphine in high doses causes itching & red face.  Morphine in high doses causes itching & red face. Other reaction(s): Other (See Comments) Morphine in high doses causes itching & red face. Morphine in high doses causes itching & red face. Morphine in high doses causes itching & red face.  . Morphine And Related Itching    Morphine in high doses causes itching & red face.   Marland Kitchen Cymbalta [Duloxetine Hcl] Other (See Comments)    Serotonin Reaction  . Duloxetine Other (See Comments)    Other reaction(s): Other (See Comments), Other (See Comments) Serotonin syndrome Serotonin reaction  Serotonin  syndrome Other reaction(s): Other (See Comments), Other (See Comments) Serotonin syndrome Serotonin reaction  . Pregabalin Other (See Comments)    Other reaction(s): Other (See Comments) Serotinon reaction serotinon reaction  Serotinon reaction Other reaction(s): Other (See Comments) Serotinon reaction serotinon reaction    Antimicrobials this admission:  CTX x 1 4/28   Cefepime 4/29>>    Vanc 4/29 >>   Dose adjustments this admission:   Microbiology results:  BCx:   UCx:    Sputum:    MRSA PCR:  Cxr: multilobar pneumonia with diffuse bilateral groundglass opacity seen on CT chest.    Thank you for allowing pharmacy to be a part of this patient's care.  Amy Huynh A 09/13/2019 3:55 PM

## 2019-09-13 NOTE — Progress Notes (Addendum)
PROGRESS NOTE    Amy Huynh  UQJ:[id number] DOB: 03-29-1956 DOA: 09/12/2019 PCP: Oletha Blend, MD    Chief Complaint  Patient presents with  . Fever  . Pneumonia    Brief Narrative:  64 year old female with history of Sjogren's on methotrexate, fibromyalgia presented with nonproductive cough or shortness of breath for past 4 days.  She also had fever of 103F at home.  She went to [facility] 2 days prior to admission and had a chest x-ray and CT chest and was diagnosed with pneumonia and a UTI.  On reviewing CT chest done at [facility] on 4/27 it comments as diffuse scattered groundglass opacity in bilateral lung parenchyma, more prominent in the right upper lobe with minimal interlobular septal thickening (suggestive of combination of infection and inflammation with underlying pulmonary edema). Also has a trace right pleural effusion with bilateral hilar and mediastinal lymphadenopathy (likely reactive).  She was prescribed Z-Pak and amoxicillin and discharged home.  Despite antibiotic she continued to have fevers and shortness of breath.  Her son brought her pulse ox and found to have her O2 sat in the 70s. Patient presented to the ED where her O2 sat was in the 80s and was increasingly short of breath. Patient admitted for acute hypoxic respiratory failure secondary to lobar pneumonia.  Patient was tested negative for COVID-19 PCR on 4/18 and 4/27 at [facility].  Reports being vaccinated for 2 doses over 1 month back.   Assessment & Plan:   Principal Problem: Acute respiratory failure with hypoxia (HCC) Secondary to multilobar pneumonia with diffuse bilateral groundglass opacity seen on CT chest.  Also comments on acute pulmonary edema.  On empiric IV vancomycin and cefepime which I will continue.  Also added IV Solu-Medrol 40 mg every 12 hours given diffuse inflammatory CT findings.  Check urine for strep and Legionella antigen.  Follow blood cultures.  Add Tussionex twice daily Continue  supplemental oxygen (currently on 2 L) and wean as tolerated. Check 2D echo to assess LVEF and pulmonary artery pressure. If no clinical improvement in the next 24 to 48 hours will consult pulmonary.  Given extensive lung involvement, bilateral hilar and mediastinal lymphadenopathy needs follow-up CT chest in about 3 months to ensure resolution.  Active Problems: Hypokalemia/hypomagnesemia Replenished  History of migraine Continue Fioricet  GERD Continue PPI  Essential hypertension Continue HCTZ  History of Sjogren's Follows with rheumatology at [facility].  Patient reports her methotrexate were held by her rheumatologist due to?  Hypokalemia.  Resume as outpatient.    DVT prophylaxis: Subcu Lovenox Code Status: Full code Family Communication: We will update her son Disposition: Home possibly in the next 48 hours if respiratory symptoms improve  Status is: Inpatient  Remains inpatient appropriate because:IV treatments appropriate due to intensity of illness or inability to take PO.  Still in acute hypoxic respiratory failure requiring supplemental oxygen, IV antibiotics for multilobar pneumonia, IV steroid.  Patient unsafe for discharge home with concern for worsening respiratory symptoms.   Dispo: The patient is from: Home              Anticipated d/c is to: Home              Anticipated d/c date is: 2 days              Patient currently is not medically stable to d/c.        Consultants:   None  Procedures: (None    Antimicrobials: IV vancomycin and cefepime  Subjective:  Reports her breathing to be slightly better on oxygen and gets dyspneic on minimal exertion.  Still having nonproductive cough.  Objective: Vitals:   09/13/19 0443 09/13/19 0449 09/13/19 0804 09/13/19 1150  BP:  109/61 122/62 127/63  Pulse:  84 88 99  Resp:  18 18 18   Temp:  98.8 F (37.1 C) 99.1 F (37.3 C) 99.7 F (37.6 C)  TempSrc:  Oral Oral Oral  SpO2: (!) 88% 97% 97% 95%  Weight:   85.3 kg    Height:        Intake/Output Summary (Last 24 hours) at 09/13/2019 1422 Last data filed at 09/13/2019 1019 Gross per 24 hour  Intake 505.83 ml  Output 1 ml  Net 504.83 ml   Filed Weights   09/12/19 1834 09/13/19 0449  Weight: 82.6 kg 85.3 kg    Examination:  General: Middle-aged female fatigued, not in distress HEENT: Moist mucosa, supple neck Chest: Fine bibasilar crackles, no rhonchi or wheeze CVS: Normal S1-S2 GI: Soft, nondistended, nontender Musculoskeletal: Warm, no edema   Data Reviewed: I have personally reviewed following labs and imaging studies  CBC: Recent Labs  Lab 09/12/19 1945 09/13/19 0529  WBC 6.9 5.4  HGB 10.5* 11.3*  HCT 31.3* 34.1*  MCV 83.5 84.8  PLT 208 205    Basic Metabolic Panel: Recent Labs  Lab 09/12/19 1945 09/12/19 2212 09/13/19 0529  NA 130*  --  137  K 3.0*  --  3.3*  CL 98  --  104  CO2 21*  --  24  GLUCOSE 92  --  94  BUN 11  --  9  CREATININE 1.07*  --  0.81  CALCIUM 8.5*  --  8.6*  MG  --  1.6* 2.2    GFR: Estimated Creatinine Clearance: 73.8 mL/min (by C-G formula based on SCr of 0.81 mg/dL).  Liver Function Tests: No results for input(s): AST, ALT, ALKPHOS, BILITOT, PROT, ALBUMIN in the last 168 hours.  CBG: No results for input(s): GLUCAP in the last 168 hours.   Recent Results (from the past 240 hour(s))  Culture, blood (Routine X 2) w Reflex to ID Panel     Status: None (Preliminary result)   Collection Time: 09/12/19  9:54 PM   Specimen: BLOOD LEFT HAND  Result Value Ref Range Status   Specimen Description BLOOD LEFT HAND  Final   Special Requests   Final    BOTTLES DRAWN AEROBIC AND ANAEROBIC Blood Culture adequate volume   Culture   Final    NO GROWTH < 12 HOURS Performed at [facility], [address] [place], [place] [address]    Report Status PENDING  Incomplete  Culture, blood (Routine X 2) w Reflex to ID Panel     Status: None (Preliminary result)   Collection Time:  09/12/19 10:13 PM   Specimen: BLOOD RIGHT HAND  Result Value Ref Range Status   Specimen Description BLOOD RIGHT HAND  Final   Special Requests   Final    BOTTLES DRAWN AEROBIC AND ANAEROBIC Blood Culture adequate volume   Culture   Final    NO GROWTH < 12 HOURS Performed at [facility], [address] [place], [place] [address]    Report Status PENDING  Incomplete         Radiology Studies: DG Chest Portable 1 View  Result Date: 09/12/2019 CLINICAL DATA:  Patient with low oxygen saturation. EXAM: PORTABLE CHEST 1 VIEW COMPARISON:  Chest radiograph 02/23/2018. FINDINGS: Monitoring leads  overlie the patient. Patchy consolidative opacity within the right mid lung. No pleural effusion or pneumothorax. Osseous structures unremarkable. IMPRESSION: Patchy right midlung opacity may represent pneumonia in the appropriate clinical setting. Followup PA and lateral chest X-ray is recommended in 3-4 weeks following trial of antibiotic therapy to ensure resolution and exclude underlying malignancy. Electronically Signed   By: Lovey Newcomer M.D.   On: 09/12/2019 19:32   DG Humerus Right  Result Date: 09/12/2019 CLINICAL DATA:  Lytic lesion of the bone EXAM: RIGHT HUMERUS - 2+ VIEW COMPARISON:  None. FINDINGS: There is a small 1 cm well circumscribed lucent lesion seen within the proximal humerus which appears to be present on exam dating back to to August 24, 2017. No associated cortical breakthrough or periosteal reaction. No new osseous lesions are seen. 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   On: 09/12/2019 23:00   ECHOCARDIOGRAM COMPLETE  Result Date: 09/13/2019    ECHOCARDIOGRAM REPORT   Patient Name:   Amy Huynh Date of Exam: 09/13/2019 Medical Rec #:  [id number]               Height:       62.5 in Accession #:    [id number]               Weight:       188.1 lb Date of Birth:  1955/09/20              BSA:          1.873 m Patient Age:    49 years                BP:           122/62 mmHg Patient Gender: F                       HR:           88 bpm. Exam Location:  [facility] Procedure: 2D Echo, Cardiac Doppler and Color Doppler Indications:     Acute respiratory in sufficiency 518.82  History:         Patient has no prior history of Echocardiogram examinations.                  Migraines.  Sonographer:     Sherrie Sport RDCS (AE) Referring Phys:  [place] Diagnosing Phys: Neoma Laming MD  Sonographer Comments: Suboptimal apical window. IMPRESSIONS  1. Left ventricular ejection fraction, by estimation, is 60 to 65%. The left ventricle has normal function. The left ventricle has no regional wall motion abnormalities. Left ventricular diastolic parameters are consistent with Grade I diastolic dysfunction (impaired relaxation).  2. Right ventricular systolic function is normal. The right ventricular size is normal. There is normal pulmonary artery systolic pressure.  3. The mitral valve is normal in structure. Trivial mitral valve regurgitation. No evidence of mitral stenosis.  4. The aortic valve is normal in structure. Aortic valve regurgitation is not visualized. No aortic stenosis is present.  5. The inferior vena cava is normal in size with greater than 50% respiratory variability, suggesting right atrial pressure of 3 mmHg. FINDINGS  Left Ventricle: Left ventricular ejection fraction, by estimation, is 60 to 65%. The left ventricle has normal function. The left ventricle has no regional wall motion abnormalities. The left ventricular internal cavity size was  normal in size. There is  no left ventricular hypertrophy. Left ventricular diastolic parameters are consistent with Grade I diastolic dysfunction (impaired relaxation). Right Ventricle: The right ventricular size is normal. No increase in right ventricular wall thickness. Right ventricular  systolic function is normal. There is normal pulmonary artery systolic pressure. The tricuspid regurgitant velocity is 2.52 m/s, and  with an assumed right atrial pressure of 10 mmHg, the estimated right ventricular systolic pressure is 35.4 mmHg. Left Atrium: Left atrial size was normal in size. Right Atrium: Right atrial size was normal in size. Pericardium: There is no evidence of pericardial effusion. Mitral Valve: The mitral valve is normal in structure. Normal mobility of the mitral valve leaflets. Trivial mitral valve regurgitation. No evidence of mitral valve stenosis. Tricuspid Valve: The tricuspid valve is normal in structure. Tricuspid valve regurgitation is trivial. No evidence of tricuspid stenosis. Aortic Valve: The aortic valve is normal in structure. Aortic valve regurgitation is not visualized. No aortic stenosis is present. Aortic valve mean gradient measures 5.0 mmHg. Aortic valve peak gradient measures 9.9 mmHg. Aortic valve area, by VTI measures 1.93 cm. Pulmonic Valve: The pulmonic valve was normal in structure. Pulmonic valve regurgitation is not visualized. No evidence of pulmonic stenosis. Aorta: The aortic root is normal in size and structure. Venous: The inferior vena cava is normal in size with greater than 50% respiratory variability, suggesting right atrial pressure of 3 mmHg. IAS/Shunts: No atrial level shunt detected by color flow Doppler.  LEFT VENTRICLE PLAX 2D LVIDd:         4.72 cm  Diastology LVIDs:         2.83 cm  LV e' lateral:   10.60 cm/s LV PW:         1.06 cm  LV E/e' lateral: 6.8 LV IVS:        0.64 cm  LV e' medial:    8.92 cm/s LVOT diam:     2.00 cm  LV E/e' medial:  8.1 LV SV:         49 LV SV Index:   26 LVOT Area:     3.14 cm  RIGHT VENTRICLE RV Basal diam:  3.28 cm RV S prime:     14.90 cm/s TAPSE (M-mode): 3.5 cm LEFT ATRIUM             Index       RIGHT ATRIUM           Index LA diam:        3.50 cm 1.87 cm/m  RA Area:     14.80 cm LA Vol (A2C):   63.3 ml  33.79 ml/m RA Volume:   32.10 ml  17.13 ml/m LA Vol (A4C):   71.6 ml 38.22 ml/m LA Biplane Vol: 70.0 ml 37.36 ml/m  AORTIC VALVE                    PULMONIC VALVE AV Area (Vmax):    1.97 cm     PV Vmax:        0.89 m/s AV Area (Vmean):   1.72 cm     PV Peak grad:   3.2 mmHg AV Area (VTI):     1.93 cm     RVOT Peak grad: 3 mmHg AV Vmax:           157.50 cm/s AV Vmean:          103.150 cm/s AV VTI:  0.252 m AV Peak Grad:      9.9 mmHg AV Mean Grad:      5.0 mmHg LVOT Vmax:         98.60 cm/s LVOT Vmean:        56.400 cm/s LVOT VTI:          0.155 m LVOT/AV VTI ratio: 0.61  AORTA Ao Root diam: 3.00 cm MITRAL VALVE               TRICUSPID VALVE MV Area (PHT): 5.27 cm    TR Peak grad:   25.4 mmHg MV Decel Time: 144 msec    TR Vmax:        252.00 cm/s MV E velocity: 72.40 cm/s MV A velocity: 93.40 cm/s  SHUNTS MV E/A ratio:  0.78        Systemic VTI:  0.16 m                            Systemic Diam: 2.00 cm Adrian Blackwater MD Electronically signed by Adrian Blackwater MD Signature Date/Time: 09/13/2019/11:31:45 AM    Final         Scheduled Meds: . chlorpheniramine-HYDROcodone  5 mL Oral Q12H  . enoxaparin (LOVENOX) injection  40 mg Subcutaneous Q24H  . folic acid  1 mg Oral Daily  . gabapentin  900 mg Oral QHS  . loratadine  10 mg Oral Daily  . methylPREDNISolone (SOLU-MEDROL) injection  40 mg Intravenous Q12H  . pantoprazole  40 mg Oral Daily  . pilocarpine  5 mg Oral BID  . rOPINIRole  0.5 mg Oral Daily  . saccharomyces boulardii  250 mg Oral BID  . sodium chloride flush  3 mL Intravenous Once  . topiramate  200 mg Oral Daily  . cyanocobalamin  1,000 mcg Oral Daily   Continuous Infusions: . ceFEPime (MAXIPIME) IV    . vancomycin       LOS: 1 day    Time spent: 35  minutes    Ananya Mccleese, MD [facility]   To contact the attending provider between 7A-7P or the covering provider during after hours 7P-7A, please log into the web site [email or web address] and access using  universal [place] password for that web site. If you do not have the password, please call the hospital operator.  09/13/2019, 2:22 PM

## 2019-09-13 NOTE — Progress Notes (Signed)
PHARMACY NOTE:  ANTIMICROBIAL RENAL DOSAGE ADJUSTMENT  Current antimicrobial regimen includes a mismatch between antimicrobial dosage and estimated renal function.  As per policy approved by the Pharmacy & Therapeutics and Medical Executive Committees, the antimicrobial dosage will be adjusted accordingly.  Current antimicrobial dosage:  Cefepime 1 gm IV q8h  Indication: PNA  Renal Function:  Estimated Creatinine Clearance: 73.8 mL/min (by C-G formula based on SCr of 0.81 mg/dL).     Antimicrobial dosage has been changed to:  Cefepime 2 gm IV q8h  Additional comments:   Thank you for allowing pharmacy to be a part of this patient's care.  Nakeda Lebron A, [facility] 09/13/2019 1:48 PM

## 2019-09-13 NOTE — Progress Notes (Signed)
*  PRELIMINARY RESULTS* Echocardiogram 2D Echocardiogram has been performed.  Cristela Blue 09/13/2019, 10:52 AM

## 2019-09-13 NOTE — Progress Notes (Signed)
Pharmacy Antibiotic Note  Amy Huynh is a 64 y.o. female admitted on 09/12/2019 with pneumonia.  Pharmacy has been consulted for Vancomycin dosing.  Plan: Vancomycin 750 mg IV Q 24 hrs. Goal AUC 400-550. Expected AUC: 442.8, Css min 11.3 SCr used: 1.07  Height: 5' 2.5" (158.8 cm) Weight: 82.6 kg (182 lb) IBW/kg (Calculated) : 51.25  Temp (24hrs), Avg:98.8 F (37.1 C), Min:98.5 F (36.9 C), Max:99.1 F (37.3 C)  Recent Labs  Lab 09/12/19 1945  WBC 6.9  CREATININE 1.07*    Estimated Creatinine Clearance: 54.9 mL/min (A) (by C-G formula based on SCr of 1.07 mg/dL (H)).    Allergies  Allergen Reactions  . Hydrocodone-Acetaminophen     Other reaction(s): Other (See Comments) Severe headache  . Hydrocodone-Acetaminophen     Other reaction(s): Headache, Other (See Comments) Severe headache Severe headache  Other reaction(s): Headache Severe headache Other reaction(s): Headache, Other (See Comments) Severe headache Severe headache  . Morphine Itching and Other (See Comments)    Other reaction(s): Other (See Comments) Morphine in high doses causes itching & red face. Morphine in high doses causes itching & red face.  Morphine in high doses causes itching & red face. Other reaction(s): Other (See Comments) Morphine in high doses causes itching & red face. Morphine in high doses causes itching & red face. Morphine in high doses causes itching & red face.  . Morphine And Related Itching    Morphine in high doses causes itching & red face.   Marland Kitchen Cymbalta [Duloxetine Hcl] Other (See Comments)    Serotonin Reaction  . Duloxetine Other (See Comments)    Other reaction(s): Other (See Comments), Other (See Comments) Serotonin syndrome Serotonin reaction  Serotonin syndrome Other reaction(s): Other (See Comments), Other (See Comments) Serotonin syndrome Serotonin reaction  . Pregabalin Other (See Comments)    Other reaction(s): Other (See Comments) Serotinon  reaction serotinon reaction  Serotinon reaction Other reaction(s): Other (See Comments) Serotinon reaction serotinon reaction    Antimicrobials this admission:   >>    >>   Dose adjustments this admission:   Microbiology results:  BCx:   UCx:    Sputum:    MRSA PCR:   Thank you for allowing pharmacy to be a part of this patient's care.  Valrie Hart A 09/13/2019 3:39 AM

## 2019-09-14 LAB — BASIC METABOLIC PANEL
Anion gap: 7 (ref 5–15)
BUN: 11 mg/dL (ref 8–23)
CO2: 24 mmol/L (ref 22–32)
Calcium: 8.8 mg/dL — ABNORMAL LOW (ref 8.9–10.3)
Chloride: 106 mmol/L (ref 98–111)
Creatinine, Ser: 0.63 mg/dL (ref 0.44–1.00)
GFR calc Af Amer: >60 mL/min (ref >60)
GFR calc non Af Amer: >60 mL/min (ref >60)
Glucose, Bld: 124 mg/dL — ABNORMAL HIGH (ref 70–99)
Potassium: 4.1 mmol/L (ref 3.5–5.1)
Sodium: 137 mmol/L (ref 135–145)

## 2019-09-14 LAB — LEGIONELLA PNEUMOPHILA SEROGP 1 UR AG: L. pneumophila Serogp 1 Ur Ag: NEGATIVE

## 2019-09-14 LAB — GLUCOSE, CAPILLARY: Glucose-Capillary: 116 mg/dL — ABNORMAL HIGH (ref 70–99)

## 2019-09-14 MED ORDER — SODIUM CHLORIDE 0.9 % IV SOLN
INTRAVENOUS | Status: DC | PRN
  Administered 2019-09-14 – 2019-09-16 (×7): 250 mL via INTRAVENOUS

## 2019-09-14 MED ORDER — DIPHENHYDRAMINE HCL 25 MG PO CAPS: 25.0000 mg | ORAL_CAPSULE | Freq: Four times a day (QID) | ORAL | Status: DC | PRN

## 2019-09-14 MED ORDER — VANCOMYCIN HCL IN DEXTROSE 750-5 MG/150ML-% IV SOLN
750.0000 mg | Freq: Two times a day (BID) | INTRAVENOUS | Status: DC
  Administered 2019-09-14 – 2019-09-15 (×2): 750 mg via INTRAVENOUS
  Filled 2019-09-14 (×4): qty 150

## 2019-09-14 NOTE — Progress Notes (Signed)
PROGRESS NOTE    Amy Huynh  SNK:[id number] DOB: [birth year] DOA: 09/12/2019 PCP: Verdie Shire, MD    Chief Complaint  Patient presents with  . Fever  . Pneumonia    Brief Narrative:  64 year old female with history of Sjogren's on methotrexate, fibromyalgia presented with nonproductive cough or shortness of breath for past 4 days.  She also had fever of 103F at home.  She went to [facility] 2 days prior to admission and had a chest x-ray and CT chest and was diagnosed with pneumonia and a UTI.  On reviewing CT chest done at [facility] on 4/27 it comments as diffuse scattered groundglass opacity in bilateral lung parenchyma, more prominent in the right upper lobe with minimal interlobular septal thickening (suggestive of combination of infection and inflammation with underlying pulmonary edema). Also has a trace right pleural effusion with bilateral hilar and mediastinal lymphadenopathy (likely reactive).  She was prescribed Z-Pak and amoxicillin and discharged home.  Despite antibiotic she continued to have fevers and shortness of breath.  Her son brought her pulse ox and found to have her O2 sat in the 70s. Patient presented to the ED where her O2 sat was in the 80s and was increasingly short of breath. Patient admitted for acute hypoxic respiratory failure secondary to lobar pneumonia.  Patient was tested negative for COVID-19 PCR on 4/18 and 4/27 at [facility].  Reports being vaccinated for 2 doses over 1 month back.   Assessment & Plan:   Principal Problem: Acute respiratory failure with hypoxia (HCC) Secondary to multilobar pneumonia with diffuse bilateral groundglass opacity seen on CT chest.  Suspect both infectious and inflammatory lung disease.  On empiric vancomycin and cefepime.    Blood cultures so far negative for growth.  On IV Solu-Medrol 40 mg every 12 hours given diffuse groundglass opacity and inflammatory changes noted on chest CT.   de-escalate antibiotic tomorrow if  symptoms continue to improve.  Continue antitussives. O2 sat stable on room air but gets increasingly short of breath on ambulation. Urine strep antigen negative Legionella antigen pending. 2D echo with normal EF, no wall motion abnormality or pulmonary artery hypertension.  Given extensive lung involvement, bilateral hilar and mediastinal lymphadenopathy needs follow-up CT chest in about 3 months to ensure resolution.  I briefly discussed the case with pulmonologist Dr. Lanney Gins who recommends outpatient follow-up with him.  Active Problems: Hypokalemia/hypomagnesemia Replenished  History of migraine Continue Fioricet  GERD Continue PPI  Essential hypertension Continue HCTZ  History of Sjogren's Follows with rheumatology at [facility].  Patient reports her methotrexate were held by her rheumatologist due to?  Hypokalemia.  Resume as outpatient.    DVT prophylaxis: Subcu Lovenox Code Status: Full code Family Communication: We will update her son Disposition: Home possibly in the next 24-48 hours if respiratory symptoms improve  Status is: Inpatient  Remains inpatient appropriate because: Slow improvement of her symptoms and increasingly dyspneic on ambulation.  Continues to require IV antibiotic and IV steroid.  Unstable for discharge home with high risk for developing sepsis and ongoing respiratory failure   Dispo: The patient is from: Home              Anticipated d/c is to: Home              Anticipated d/c date is: 1 day              Patient currently is not medically stable to d/c.        Consultants:  None  Procedures: (None    Antimicrobials: IV vancomycin and cefepime  Subjective: Still having persistent cough.  Breathing slightly improved and sats stable on room air but gets increasingly dyspneic on minimal ambulation.  Objective: Vitals:   09/13/19 2003 09/14/19 0029 09/14/19 0620 09/14/19 0734  BP: (!) 95/54 115/68 107/65 111/74  Pulse: 87 91 (!)  51 62  Resp: 20 20 20    Temp: 98.9 F (37.2 C) 98.5 F (36.9 C) 98.7 F (37.1 C) 97.8 F (36.6 C)  TempSrc: Oral Oral Oral Oral  SpO2: 95% 97% 97% 96%  Weight:      Height:        Intake/Output Summary (Last 24 hours) at 09/14/2019 1341 Last data filed at 09/13/2019 1902 Gross per 24 hour  Intake 340 ml  Output --  Net 340 ml   Filed Weights   09/12/19 1834 09/13/19 0449  Weight: 82.6 kg 85.3 kg   Physical exam Not in distress, fatigue HEENT: Moist mucosa, supple neck Chest: Bibasilar crackles CVs: Normal S1-S2 GI: Soft, nontender, nondistended Musculoskeletal: Warm, no edema   Data Reviewed: I have personally reviewed following labs and imaging studies  CBC: Recent Labs  Lab 09/12/19 1945 09/13/19 0529  WBC 6.9 5.4  HGB 10.5* 11.3*  HCT 31.3* 34.1*  MCV 83.5 84.8  PLT 208 205    Basic Metabolic Panel: Recent Labs  Lab 09/12/19 1945 09/12/19 2212 09/13/19 0529 09/14/19 0509  NA 130*  --  137 137  K 3.0*  --  3.3* 4.1  CL 98  --  104 106  CO2 21*  --  24 24  GLUCOSE 92  --  94 124*  BUN 11  --  9 11  CREATININE 1.07*  --  0.81 0.63  CALCIUM 8.5*  --  8.6* 8.8*  MG  --  1.6* 2.2  --     GFR: Estimated Creatinine Clearance: 74.7 mL/min (by C-G formula based on SCr of 0.63 mg/dL).  Liver Function Tests: No results for input(s): AST, ALT, ALKPHOS, BILITOT, PROT, ALBUMIN in the last 168 hours.  CBG: Recent Labs  Lab 09/13/19 2120  GLUCAP 116*     Recent Results (from the past 240 hour(s))  Culture, blood (Routine X 2) w Reflex to ID Panel     Status: None (Preliminary result)   Collection Time: 09/12/19  9:54 PM   Specimen: BLOOD LEFT HAND  Result Value Ref Range Status   Specimen Description BLOOD LEFT HAND  Final   Special Requests   Final    BOTTLES DRAWN AEROBIC AND ANAEROBIC Blood Culture adequate volume   Culture   Final    NO GROWTH 2 DAYS Performed at [facility], [address] [place], [place] [address]    Report  Status PENDING  Incomplete  Culture, blood (Routine X 2) w Reflex to ID Panel     Status: None (Preliminary result)   Collection Time: 09/12/19 10:13 PM   Specimen: BLOOD RIGHT HAND  Result Value Ref Range Status   Specimen Description BLOOD RIGHT HAND  Final   Special Requests   Final    BOTTLES DRAWN AEROBIC AND ANAEROBIC Blood Culture adequate volume   Culture   Final    NO GROWTH 2 DAYS Performed at [facility], [address] [place], [place] [address]    Report Status PENDING  Incomplete  MRSA PCR Screening     Status: Abnormal   Collection Time: 09/13/19  2:37 PM  Specimen: Nasopharyngeal  Result Value Ref Range Status   MRSA by PCR POSITIVE (A) NEGATIVE Final    Comment:        The GeneXpert MRSA Assay (FDA approved for NASAL specimens only), is one component of a comprehensive MRSA colonization surveillance program. It is not intended to diagnose MRSA infection nor to guide or monitor treatment for MRSA infections. RESULT CALLED TO, READ BACK BY AND VERIFIED WITH: ANNA RODGRIGUEZ @1622  09/13/19 MJU Performed at [facility], [address], [place], [place]          Radiology Studies: DG Chest Portable 1 View  Result Date: 09/12/2019 CLINICAL DATA:  Patient with low oxygen saturation. EXAM: PORTABLE CHEST 1 VIEW COMPARISON:  Chest radiograph 02/23/2018. FINDINGS: Monitoring leads overlie the patient. Patchy consolidative opacity within the right mid lung. No pleural effusion or pneumothorax. Osseous structures unremarkable. IMPRESSION: Patchy right midlung opacity may represent pneumonia in the appropriate clinical setting. Followup PA and lateral chest X-ray is recommended in 3-4 weeks following trial of antibiotic therapy to ensure resolution and exclude underlying malignancy. Electronically Signed   By: 04/25/2018 M.D.   On: 09/12/2019 19:32   DG Humerus Right  Result Date: 09/12/2019 CLINICAL DATA:  Lytic lesion of the bone  EXAM: RIGHT HUMERUS - 2+ VIEW COMPARISON:  None. FINDINGS: There is a small 1 cm well circumscribed lucent lesion seen within the proximal humerus which appears to be present on exam dating back to to August 24, 2017. No associated cortical breakthrough or periosteal reaction. No new osseous lesions are seen. 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   On: 09/12/2019 23:00   ECHOCARDIOGRAM COMPLETE  Result Date: 09/13/2019    ECHOCARDIOGRAM REPORT   Patient Name:   ANNACLAIRE WALSWORTH Date of Exam: 09/13/2019 Medical Rec #:  09/15/2019               Height:       62.5 in Accession #:    [id number]              Weight:       188.1 lb Date of Birth:  29-Oct-1955              BSA:          1.873 m Patient Age:    63 years                BP:           122/62 mmHg Patient Gender: F                       HR:           88 bpm. Exam Location:  [facility] Procedure: 2D Echo, Cardiac Doppler and Color Doppler Indications:     Acute respiratory in sufficiency 518.82  History:         Patient has no prior history of Echocardiogram examinations.                  Migraines.  Sonographer:     03-02[birth year]1970 RDCS (AE) Referring Phys:  Cristela [facility] Diagnosing Phys: [facility] MD  Sonographer Comments: Suboptimal apical window. IMPRESSIONS  1. Left ventricular ejection fraction, by estimation, is 60 to 65%. The left ventricle has normal function. The left ventricle has no regional wall motion  abnormalities. Left ventricular diastolic parameters are consistent with Grade I diastolic dysfunction (impaired relaxation).  2. Right ventricular systolic function is normal. The right ventricular size is normal. There is normal pulmonary artery systolic pressure.  3. The mitral valve is normal in structure. Trivial mitral valve regurgitation. No evidence of mitral stenosis.  4. The aortic valve is normal  in structure. Aortic valve regurgitation is not visualized. No aortic stenosis is present.  5. The inferior vena cava is normal in size with greater than 50% respiratory variability, suggesting right atrial pressure of 3 mmHg. FINDINGS  Left Ventricle: Left ventricular ejection fraction, by estimation, is 60 to 65%. The left ventricle has normal function. The left ventricle has no regional wall motion abnormalities. The left ventricular internal cavity size was normal in size. There is  no left ventricular hypertrophy. Left ventricular diastolic parameters are consistent with Grade I diastolic dysfunction (impaired relaxation). Right Ventricle: The right ventricular size is normal. No increase in right ventricular wall thickness. Right ventricular systolic function is normal. There is normal pulmonary artery systolic pressure. The tricuspid regurgitant velocity is 2.52 m/s, and  with an assumed right atrial pressure of 10 mmHg, the estimated right ventricular systolic pressure is 35.4 mmHg. Left Atrium: Left atrial size was normal in size. Right Atrium: Right atrial size was normal in size. Pericardium: There is no evidence of pericardial effusion. Mitral Valve: The mitral valve is normal in structure. Normal mobility of the mitral valve leaflets. Trivial mitral valve regurgitation. No evidence of mitral valve stenosis. Tricuspid Valve: The tricuspid valve is normal in structure. Tricuspid valve regurgitation is trivial. No evidence of tricuspid stenosis. Aortic Valve: The aortic valve is normal in structure. Aortic valve regurgitation is not visualized. No aortic stenosis is present. Aortic valve mean gradient measures 5.0 mmHg. Aortic valve peak gradient measures 9.9 mmHg. Aortic valve area, by VTI measures 1.93 cm. Pulmonic Valve: The pulmonic valve was normal in structure. Pulmonic valve regurgitation is not visualized. No evidence of pulmonic stenosis. Aorta: The aortic root is normal in size and structure.  Venous: The inferior vena cava is normal in size with greater than 50% respiratory variability, suggesting right atrial pressure of 3 mmHg. IAS/Shunts: No atrial level shunt detected by color flow Doppler.  LEFT VENTRICLE PLAX 2D LVIDd:         4.72 cm  Diastology LVIDs:         2.83 cm  LV e' lateral:   10.60 cm/s LV PW:         1.06 cm  LV E/e' lateral: 6.8 LV IVS:        0.64 cm  LV e' medial:    8.92 cm/s LVOT diam:     2.00 cm  LV E/e' medial:  8.1 LV SV:         49 LV SV Index:   26 LVOT Area:     3.14 cm  RIGHT VENTRICLE RV Basal diam:  3.28 cm RV S prime:     14.90 cm/s TAPSE (M-mode): 3.5 cm LEFT ATRIUM             Index       RIGHT ATRIUM           Index LA diam:        3.50 cm 1.87 cm/m  RA Area:     14.80 cm LA Vol (A2C):   63.3 ml 33.79 ml/m RA Volume:   32.10 ml  17.13 ml/m LA Vol (A4C):  71.6 ml 38.22 ml/m LA Biplane Vol: 70.0 ml 37.36 ml/m  AORTIC VALVE                    PULMONIC VALVE AV Area (Vmax):    1.97 cm     PV Vmax:        0.89 m/s AV Area (Vmean):   1.72 cm     PV Peak grad:   3.2 mmHg AV Area (VTI):     1.93 cm     RVOT Peak grad: 3 mmHg AV Vmax:           157.50 cm/s AV Vmean:          103.150 cm/s AV VTI:            0.252 m AV Peak Grad:      9.9 mmHg AV Mean Grad:      5.0 mmHg LVOT Vmax:         98.60 cm/s LVOT Vmean:        56.400 cm/s LVOT VTI:          0.155 m LVOT/AV VTI ratio: 0.61  AORTA Ao Root diam: 3.00 cm MITRAL VALVE               TRICUSPID VALVE MV Area (PHT): 5.27 cm    TR Peak grad:   25.4 mmHg MV Decel Time: 144 msec    TR Vmax:        252.00 cm/s MV E velocity: 72.40 cm/s MV A velocity: 93.40 cm/s  SHUNTS MV E/A ratio:  0.78        Systemic VTI:  0.16 m                            Systemic Diam: 2.00 cm Adrian Blackwater MD Electronically signed by Adrian Blackwater MD Signature Date/Time: 09/13/2019/11:31:45 AM    Final         Scheduled Meds: . Chlorhexidine Gluconate Cloth  6 each Topical Q0600  . chlorpheniramine-HYDROcodone  5 mL Oral Q12H  . enoxaparin  (LOVENOX) injection  40 mg Subcutaneous Q24H  . folic acid  1 mg Oral Daily  . gabapentin  900 mg Oral QHS  . loratadine  10 mg Oral Daily  . methylPREDNISolone (SOLU-MEDROL) injection  40 mg Intravenous Q12H  . mupirocin ointment  1 application Nasal BID  . pantoprazole  40 mg Oral Daily  . pilocarpine  5 mg Oral BID  . rOPINIRole  0.5 mg Oral Daily  . saccharomyces boulardii  250 mg Oral BID  . sodium chloride flush  3 mL Intravenous Once  . topiramate  200 mg Oral Daily  . cyanocobalamin  1,000 mcg Oral Daily   Continuous Infusions: . ceFEPime (MAXIPIME) IV 2 g (09/14/19 0630)  . Vancomycin       LOS: 2 days    Time spent: 35  minutes    Montel Vanderhoof, MD [facility]   To contact the attending provider between 7A-7P or the covering provider during after hours 7P-7A, please log into the web site [email or web address] and access using universal [place] password for that web site. If you do not have the password, please call the hospital operator.  09/14/2019, 1:41 PM

## 2019-09-14 NOTE — Progress Notes (Signed)
IV site to [facility] appears to be infiltrating. Pt c/o pain during infusion.  Site assessed and raised skin colored area is observed.  IV has been stopped pt states she is a difficult stick and [place] was used in ED upon admission.  IV team consult has been requested.  Pt arm has been elevated.  Will monitor.

## 2019-09-14 NOTE — Progress Notes (Signed)
Pharmacy Antibiotic Note  Amy Huynh is a 64 y.o. female admitted on 09/12/2019 with pneumonia.  Pharmacy has been consulted for Vancomycin dosing.  -also on Cefepime -on methotrexate PTA  Plan: Vancomycin 750 mg IV Q 24 hrs. Goal AUC 400-550. Expected AUC: 442.8, Css min 11.3 SCr used: 1.07  4/29 Scr improved to 0.81>>0.63. Will adjust Vancomycin to 1250 mg IV q24h. Expected AUC: 555, Css min 12.5 SCr used: 0.81    Height: 5' 2.5" (158.8 cm) Weight: 85.3 kg (188 lb 0.8 oz) IBW/kg (Calculated) : 51.25  Temp (24hrs), Avg:98.7 F (37.1 C), Min:97.8 F (36.6 C), Max:99.7 F (37.6 C)  Recent Labs  Lab 09/12/19 1945 09/13/19 0529 09/14/19 0509  WBC 6.9 5.4  --   CREATININE 1.07* 0.81 0.63    Estimated Creatinine Clearance: 74.7 mL/min (by C-G formula based on SCr of 0.63 mg/dL).    Allergies  Allergen Reactions  . Hydrocodone-Acetaminophen     Other reaction(s): Other (See Comments) Severe headache  . Hydrocodone-Acetaminophen     Other reaction(s): Headache, Other (See Comments) Severe headache Severe headache  Other reaction(s): Headache Severe headache Other reaction(s): Headache, Other (See Comments) Severe headache Severe headache  . Morphine Itching and Other (See Comments)    Other reaction(s): Other (See Comments) Morphine in high doses causes itching & red face. Morphine in high doses causes itching & red face.  Morphine in high doses causes itching & red face. Other reaction(s): Other (See Comments) Morphine in high doses causes itching & red face. Morphine in high doses causes itching & red face. Morphine in high doses causes itching & red face.  . Morphine And Related Itching    Morphine in high doses causes itching & red face.   Marland Kitchen Cymbalta [Duloxetine Hcl] Other (See Comments)    Serotonin Reaction  . Duloxetine Other (See Comments)    Other reaction(s): Other (See Comments), Other (See Comments) Serotonin syndrome Serotonin  reaction  Serotonin syndrome Other reaction(s): Other (See Comments), Other (See Comments) Serotonin syndrome Serotonin reaction  . Pregabalin Other (See Comments)    Other reaction(s): Other (See Comments) Serotinon reaction serotinon reaction  Serotinon reaction Other reaction(s): Other (See Comments) Serotinon reaction serotinon reaction    Antimicrobials this admission:  CTX x 1 4/28   Cefepime 4/29>>    Vanc 4/29 >>   Dose adjustments this admission:   Microbiology results:  BCx:   UCx:    Sputum:    MRSA PCR:  Cxr: multilobar pneumonia with diffuse bilateral groundglass opacity seen on CT chest.    Thank you for allowing pharmacy to be a part of this patient's care.  Vincenta Steffey A 09/14/2019 8:52 AM

## 2019-09-14 NOTE — Plan of Care (Signed)
  Problem: Education: Goal: Knowledge of General Education information will improve Description Including pain rating scale, medication(s)/side effects and non-pharmacologic comfort measures Outcome: Progressing   Problem: Health Behavior/Discharge Planning: Goal: Ability to manage health-related needs will improve Outcome: Progressing   

## 2019-09-15 DIAGNOSIS — R0602 Shortness of breath: Secondary | ICD-10-CM

## 2019-09-15 LAB — CREATININE, SERUM
Creatinine, Ser: 0.75 mg/dL (ref 0.44–1.00)
GFR calc Af Amer: >60 mL/min (ref >60)
GFR calc non Af Amer: >60 mL/min (ref >60)

## 2019-09-15 MED ORDER — LOPERAMIDE HCL 2 MG PO CAPS
2.0000 mg | ORAL_CAPSULE | ORAL | Status: DC | PRN
  Filled 2019-09-15: qty 1

## 2019-09-15 MED ORDER — VANCOMYCIN HCL 1250 MG/250ML IV SOLN: 1250.0000 mg | INTRAVENOUS | Status: DC

## 2019-09-15 NOTE — Evaluation (Signed)
Physical Therapy Evaluation Patient Details Name: Amy Huynh MRN: [id number] DOB: January 26, 1956 Today's Date: 09/15/2019   History of Present Illness  Pt is a 64 y.o. female presenting to hospital 4/28 with increased SOB and fever.  Pt admitted with community acquired PNA, hypoxemia, and hypokalemia.  PMH includes fibromyalgia, migraine, neuropathy, Sjogren's disease, partial R rotator cuff tear, RLS, h/o R shoulder surgery, h/o L TKR.  Clinical Impression  Prior to hospital admission, pt was modified independent with ambulation (held onto furniture in home as needed; used cane in community) and lives with her husband in 1 level home with steps to enter.  Currently pt is SBA with transfers and CGA with ambulation 200 feet with RW.  Trialed SPC x5 feet (CGA for safety) but pt appearing to require increased UE support so switched to RW and improved gait mechanics noted with RW use (compared to [facility] use).  Mild loss of balance with RW noted towards end of ambulation when turning (pt appearing tired) but pt able to self correct.  O2 sats 94-95% on room air at rest (beginning/end of session) and 93% or greater on room air with ambulation.  Nurse cleared therapist to keep pt on room air end of session.  Pt would benefit from skilled PT to address noted impairments and functional limitations (see below for any additional details).  Upon hospital discharge, pt would benefit from HHPT.    Follow Up Recommendations Home health PT    Equipment Recommendations  Rolling walker with 5" wheels;3in1 (PT)(youth sized)    Recommendations for Other Services       Precautions / Restrictions Precautions Precautions: Fall Restrictions Weight Bearing Restrictions: No      Mobility  Bed Mobility               General bed mobility comments: Deferred (pt in recliner beginning/end of session)  Transfers Overall transfer level: Needs assistance Equipment used: None Transfers: Sit to/from  Stand Sit to Stand: Supervision         General transfer comment: increased effort to stand from recliner but overall steady; controlled descent noted sitting in recliner  Ambulation/Gait Ambulation/Gait assistance: Min guard Gait Distance (Feet): 200 Feet Assistive device: Rolling walker (2 wheeled)   Gait velocity: decreased   General Gait Details: trialed SPC but pt with decreased B LE step length/foot clearance/heelstrike and appearing to need increased UE support so switched to RW;  with RW: decreased B LE step through gait pattern and decreased B LE foot clearance but improved gait mechanics compared to [facility] use  Stairs            Wheelchair Mobility    Modified Rankin (Stroke Patients Only)       Balance Overall balance assessment: Needs assistance Sitting-balance support: No upper extremity supported;Feet supported Sitting balance-Leahy Scale: Normal Sitting balance - Comments: steady sitting reaching outside BOS   Standing balance support: No upper extremity supported Standing balance-Leahy Scale: Good Standing balance comment: steady static standing reaching within BOS                             Pertinent Vitals/Pain Pain Assessment: 0-10 Pain Score: 6  Pain Location: chronic low back pain Pain Descriptors / Indicators: Sore Pain Intervention(s): Limited activity within patient's tolerance;Monitored during session;Repositioned(pt declining pain medications)  HR 72-101 bpm during sessions activities.    Home Living Family/patient expects to be discharged to:: Private residence Living Arrangements: Spouse/significant other Available  Help at Discharge: Family Type of Home: House Home Access: Stairs to enter Entrance Stairs-Rails: Right Entrance Stairs-Number of Steps: 3-4 Home Layout: One level Home Equipment: Cane - single point;Shower seat - built in;Grab bars - toilet      Prior Function Level of Independence: Independent with  assistive device(s)         Comments: Pt reports holding onto furniture in home as needed but uses SPC in community.  No falls in past 6 months.  Has been going to pool therapy in [place] 2x's a week.     Hand Dominance        Extremity/Trunk Assessment   Upper Extremity Assessment Upper Extremity Assessment: Generalized weakness    Lower Extremity Assessment Lower Extremity Assessment: Generalized weakness    Cervical / Trunk Assessment Cervical / Trunk Assessment: Normal  Communication   Communication: No difficulties  Cognition Arousal/Alertness: Awake/alert Behavior During Therapy: WFL for tasks assessed/performed Overall Cognitive Status: Within Functional Limits for tasks assessed                                        General Comments   Nursing cleared pt for participation in physical therapy.  Pt agreeable to PT session and was sitting in recliner upon PT arrival.  Pt's nurse and pt reporting pt has been up mobilizing in room on her own.    Exercises  Gait training with RW   Assessment/Plan    PT Assessment Patient needs continued PT services  PT Problem List Decreased strength;Decreased activity tolerance;Decreased balance;Decreased mobility;Decreased knowledge of use of DME;Pain       PT Treatment Interventions DME instruction;Gait training;Stair training;Functional mobility training;Therapeutic activities;Therapeutic exercise;Balance training;Patient/family education    PT Goals (Current goals can be found in the Care Plan section)  Acute Rehab PT Goals Patient Stated Goal: to improve strength and mobility PT Goal Formulation: With patient Time For Goal Achievement: 09/29/19 Potential to Achieve Goals: Good    Frequency Min 2X/week   Barriers to discharge        Co-evaluation               AM-PAC PT "6 Clicks" Mobility  Outcome Measure Help needed turning from your back to your side while in a flat bed without using  bedrails?: None Help needed moving from lying on your back to sitting on the side of a flat bed without using bedrails?: None Help needed moving to and from a bed to a chair (including a wheelchair)?: A Little Help needed standing up from a chair using your arms (e.g., wheelchair or bedside chair)?: A Little Help needed to walk in hospital room?: A Little Help needed climbing 3-5 steps with a railing? : A Little 6 Click Score: 20    End of Session Equipment Utilized During Treatment: Gait belt Activity Tolerance: Patient tolerated treatment well Patient left: in chair;with call bell/phone within reach Nurse Communication: Mobility status;Precautions;Other (comment)(Pt's O2 sats and HR during session) PT Visit Diagnosis: Other abnormalities of gait and mobility (R26.89);Muscle weakness (generalized) (M62.81);Difficulty in walking, not elsewhere classified (R26.2)    Time: 2440-1027 PT Time Calculation (min) (ACUTE ONLY): 25 min   Charges:   PT Evaluation $PT Eval Low Complexity: 1 Low PT Treatments $Gait Training: 8-22 mins       Leitha Bleak, PT 09/15/19, 12:49 PM

## 2019-09-15 NOTE — Progress Notes (Signed)
Pharmacy Antibiotic Note  Amy Huynh is a 64 y.o. female admitted on 09/12/2019 with pneumonia.  Pharmacy has been consulted for Vancomycin dosing.  -also on Cefepime -on methotrexate PTA  Plan: Change vancomycin to 1250 mg IV q24h  Goal AUC 400-550 Expected AUC: 560 SCr used: 0.8    Height: 5' 2.5" (158.8 cm) Weight: 85.3 kg (188 lb 0.8 oz) IBW/kg (Calculated) : 51.25  Temp (24hrs), Avg:98.4 F (36.9 C), Min:98.4 F (36.9 C), Max:98.4 F (36.9 C)  Recent Labs  Lab 09/12/19 1945 09/13/19 0529 09/14/19 0509 09/15/19 0518  WBC 6.9 5.4  --   --   CREATININE 1.07* 0.81 0.63 0.75    Estimated Creatinine Clearance: 74.7 mL/min (by C-G formula based on SCr of 0.75 mg/dL).    Allergies  Allergen Reactions  . Hydrocodone-Acetaminophen     Other reaction(s): Other (See Comments) Severe headache  . Hydrocodone-Acetaminophen     Other reaction(s): Headache, Other (See Comments) Severe headache Severe headache  Other reaction(s): Headache Severe headache Other reaction(s): Headache, Other (See Comments) Severe headache Severe headache  . Morphine Itching and Other (See Comments)    Other reaction(s): Other (See Comments) Morphine in high doses causes itching & red face. Morphine in high doses causes itching & red face.  Morphine in high doses causes itching & red face. Other reaction(s): Other (See Comments) Morphine in high doses causes itching & red face. Morphine in high doses causes itching & red face. Morphine in high doses causes itching & red face.  . Morphine And Related Itching    Morphine in high doses causes itching & red face.   Marland Kitchen Cymbalta [Duloxetine Hcl] Other (See Comments)    Serotonin Reaction  . Duloxetine Other (See Comments)    Other reaction(s): Other (See Comments), Other (See Comments) Serotonin syndrome Serotonin reaction  Serotonin syndrome Other reaction(s): Other (See Comments), Other (See Comments) Serotonin  syndrome Serotonin reaction  . Pregabalin Other (See Comments)    Other reaction(s): Other (See Comments) Serotinon reaction serotinon reaction  Serotinon reaction Other reaction(s): Other (See Comments) Serotinon reaction serotinon reaction    Antimicrobials this admission:  CTX x 1 4/28   Cefepime 4/29>>    Vanc 4/29 >>     Thank you for allowing pharmacy to be a part of this patient's care.  Pricilla Riffle, PharmD 09/15/2019 7:43 AM

## 2019-09-15 NOTE — Progress Notes (Addendum)
PROGRESS NOTE    Amy Huynh  WEX:[id number] DOB: 04-Apr-1956 DOA: 09/12/2019 PCP: Oletha Blend, MD    Chief Complaint  Patient presents with  . Fever  . Pneumonia    Brief Narrative:  64 year old female with history of Sjogren's on methotrexate, fibromyalgia presented with nonproductive cough or shortness of breath for past 4 days.  She also had fever of 103F at home.  She went to [facility] 2 days prior to admission and had a chest x-ray and CT chest and was diagnosed with pneumonia and a UTI.  On reviewing CT chest done at [facility] on 4/27 it comments as diffuse scattered groundglass opacity in bilateral lung parenchyma, more prominent in the right upper lobe with minimal interlobular septal thickening (suggestive of combination of infection and inflammation with underlying pulmonary edema). Also has a trace right pleural effusion with bilateral hilar and mediastinal lymphadenopathy (likely reactive).  She was prescribed Z-Pak and amoxicillin and discharged home.  Despite antibiotic she continued to have fevers and shortness of breath.  Her son brought her pulse ox and found to have her O2 sat in the 70s. Patient presented to the ED where her O2 sat was in the 80s and was increasingly short of breath. Patient admitted for acute hypoxic respiratory failure secondary to lobar pneumonia.  Patient was tested negative for COVID-19 PCR on 4/18 and 4/27 at [facility].  Reports being vaccinated for 2 doses over 1 month back.   Assessment & Plan:   Principal Problem: Acute respiratory failure with hypoxia (HCC) Secondary to multilobar pneumonia with diffuse bilateral groundglass opacity seen on CT chest.  Suspect both infectious and inflammatory lung disease.  Narrow antibiotic to IV cefepime. Blood cultures without any growth.  Urine strep and Legionella antigen negative. Continue IV Solu-Medrol given fatty changes on chest CT.  Continue antitussives. Respiratory symptoms slowly improving and  sats stable on room air.   2D echo with normal EF, no wall motion abnormality or pulmonary artery hypertension.  Given extensive lung involvement, bilateral hilar and mediastinal lymphadenopathy needs follow-up CT chest in about 3 months to ensure resolution.  I have discussed the case with pulmonologist Dr. Karna Christmas who recommends outpatient follow-up with him.  Active Problems: Hypokalemia/hypomagnesemia Replenished  History of migraine Continue Fioricet  GERD Continue PPI  Essential hypertension Continue HCTZ  History of Sjogren's Follows with [facility]ology at [facility].  Patient reports her methotrexate were held by her rheumatologist due to?  Hypokalemia.  Resume as outpatient.  Generalized weakness Seen by PT and recommends home health, rolling walker with 3 and 1.  DVT prophylaxis: Subcu Lovenox Code Status: Full code Family Communication: Husband and son updated. Disposition: Home tomorrow if respiratory function continues to improve.  Status is: Inpatient  Patient remains inpatient due to persistent dyspnea, slowly improving compared to previous days.  Possible discharge home tomorrow if continues to improve.  Dispo: The patient is from: Home              Anticipated d/c is to: Home              Anticipated d/c date is: 1 day              Patient currently is not medically stable to d/c.        Consultants:   None  Procedures: (None    Antimicrobials: IV cefepime  Subjective: Still having persistent cough but some improvement from yesterday.  Still gets dyspneic on ambulation.  Sats in the low 90s on  room air.  Complaining of watery bowel movement after every meal.  Objective: Vitals:   09/14/19 0620 09/14/19 0734 09/14/19 2246 09/15/19 0821  BP: 107/65 111/74 116/65 121/69  Pulse: (!) 51 62 69 71  Resp: 20  16 18   Temp: 98.7 F (37.1 C) 97.8 F (36.6 C) 98.4 F (36.9 C) 98.4 F (36.9 C)  TempSrc: Oral Oral Oral Oral  SpO2: 97% 96% 97% 100%   Weight:      Height:        Intake/Output Summary (Last 24 hours) at 09/15/2019 1302 Last data filed at 09/15/2019 11/15/2019 Gross per 24 hour  Intake 717.19 ml  Output --  Net 717.19 ml   Filed Weights   09/12/19 1834 09/13/19 0449  Weight: 82.6 kg 85.3 kg   Physical exam Fatigue HEENT: Moist mucosa Chest: Fine bibasilar crackles (improved from yesterday) CVs: Normal S1-S2 GI: Soft, nondistended, nontender Musculoskeletal: Warm, no edema   Data Reviewed: I have personally reviewed following labs and imaging studies  CBC: Recent Labs  Lab 09/12/19 1945 09/13/19 0529  WBC 6.9 5.4  HGB 10.5* 11.3*  HCT 31.3* 34.1*  MCV 83.5 84.8  PLT 208 205    Basic Metabolic Panel: Recent Labs  Lab 09/12/19 1945 09/12/19 2212 09/13/19 0529 09/14/19 0509 09/15/19 0518  NA 130*  --  137 137  --   K 3.0*  --  3.3* 4.1  --   CL 98  --  104 106  --   CO2 21*  --  24 24  --   GLUCOSE 92  --  94 124*  --   BUN 11  --  9 11  --   CREATININE 1.07*  --  0.81 0.63 0.75  CALCIUM 8.5*  --  8.6* 8.8*  --   MG  --  1.6* 2.2  --   --     GFR: Estimated Creatinine Clearance: 74.7 mL/min (by C-G formula based on SCr of 0.75 mg/dL).  Liver Function Tests: No results for input(s): AST, ALT, ALKPHOS, BILITOT, PROT, ALBUMIN in the last 168 hours.  CBG: Recent Labs  Lab 09/13/19 2120  GLUCAP 116*     Recent Results (from the past 240 hour(s))  Culture, blood (Routine X 2) w Reflex to ID Panel     Status: None (Preliminary result)   Collection Time: 09/12/19  9:54 PM   Specimen: BLOOD LEFT HAND  Result Value Ref Range Status   Specimen Description BLOOD LEFT HAND  Final   Special Requests   Final    BOTTLES DRAWN AEROBIC AND ANAEROBIC Blood Culture adequate volume   Culture   Final    NO GROWTH 3 DAYS Performed at [facility], [address], [place], [place]    Report Status PENDING  Incomplete  Culture, blood (Routine X 2) w Reflex to ID Panel     Status:  None (Preliminary result)   Collection Time: 09/12/19 10:13 PM   Specimen: BLOOD RIGHT HAND  Result Value Ref Range Status   Specimen Description BLOOD RIGHT HAND  Final   Special Requests   Final    BOTTLES DRAWN AEROBIC AND ANAEROBIC Blood Culture adequate volume   Culture   Final    NO GROWTH 3 DAYS Performed at [facility], [address] [place], [place]    Report Status PENDING  Incomplete  MRSA PCR Screening     Status: Abnormal   Collection Time: 09/13/19  2:37 PM  Specimen: Nasopharyngeal  Result Value Ref Range Status   MRSA by PCR POSITIVE (A) NEGATIVE Final    Comment:        The GeneXpert MRSA Assay (FDA approved for NASAL specimens only), is one component of a comprehensive MRSA colonization surveillance program. It is not intended to diagnose MRSA infection nor to guide or monitor treatment for MRSA infections. RESULT CALLED TO, READ BACK BY AND VERIFIED WITH: ANNA RODGRIGUEZ @1622  09/13/19 MJU Performed at [facility], [address] [place], [place] [address]          Radiology Studies: No results found.      Scheduled Meds: . Chlorhexidine Gluconate Cloth  6 each Topical Q0600  . chlorpheniramine-HYDROcodone  5 mL Oral Q12H  . enoxaparin (LOVENOX) injection  40 mg Subcutaneous Q24H  . folic acid  1 mg Oral Daily  . gabapentin  900 mg Oral QHS  . loratadine  10 mg Oral Daily  . methylPREDNISolone (SOLU-MEDROL) injection  40 mg Intravenous Q12H  . mupirocin ointment  1 application Nasal BID  . pantoprazole  40 mg Oral Daily  . pilocarpine  5 mg Oral BID  . rOPINIRole  0.5 mg Oral Daily  . saccharomyces boulardii  250 mg Oral BID  . sodium chloride flush  3 mL Intravenous Once  . topiramate  200 mg Oral Daily  . cyanocobalamin  1,000 mcg Oral Daily   Continuous Infusions: . sodium chloride Stopped (09/15/19 0541)  . ceFEPime (MAXIPIME) IV 200 mL/hr at 09/15/19 0607     LOS: 3 days    Time spent: 25  minutes    Amy Breaker, MD [facility]   To contact the attending provider between 7A-7P or the covering provider during after hours 7P-7A, please log into the web site [email or web address] and access using universal [place] password for that web site. If you do not have the password, please call the hospital operator.  09/15/2019, 1:02 PM

## 2019-09-15 NOTE — Progress Notes (Signed)
Pt back to unit from outside. Pt refused any care needs at this time. WCTM.

## 2019-09-16 DIAGNOSIS — M6281 Muscle weakness (generalized): Secondary | ICD-10-CM

## 2019-09-16 DIAGNOSIS — R918 Other nonspecific abnormal finding of lung field: Secondary | ICD-10-CM

## 2019-09-16 DIAGNOSIS — J9601 Acute respiratory failure with hypoxia: Secondary | ICD-10-CM | POA: Diagnosis present

## 2019-09-16 LAB — CREATININE, SERUM
Creatinine, Ser: 0.72 mg/dL (ref 0.44–1.00)
GFR calc Af Amer: >60 mL/min (ref >60)
GFR calc non Af Amer: >60 mL/min (ref >60)

## 2019-09-16 MED ORDER — SACCHAROMYCES BOULARDII 250 MG PO CAPS: 250.0000 mg | ORAL_CAPSULE | Freq: Two times a day (BID) | ORAL | 0 refills | Status: DC

## 2019-09-16 MED ORDER — LEVOFLOXACIN 750 MG PO TABS: 750.0000 mg | ORAL_TABLET | Freq: Every day | ORAL | 0 refills | Status: AC

## 2019-09-16 MED ORDER — PREDNISONE 20 MG PO TABS
20.0000 mg | ORAL_TABLET | Freq: Every day | ORAL | 0 refills | Status: DC
Start: 2019-09-16 — End: 2019-09-16

## 2019-09-16 MED ORDER — HYDROCOD POLST-CPM POLST ER 10-8 MG/5ML PO SUER: 5.0000 mL | Freq: Two times a day (BID) | ORAL | 0 refills | Status: DC

## 2019-09-16 MED ORDER — PREDNISONE 20 MG PO TABS
20.0000 mg | ORAL_TABLET | Freq: Every day | ORAL | 0 refills | Status: DC
Start: 2019-09-16 — End: 2019-10-18

## 2019-09-16 NOTE — Discharge Summary (Addendum)
Physician Discharge Summary  Amy Huynh MWU:[id number] DOB: 21-Dec-1955 DOA: 09/12/2019  PCP: Amy Shire, MD  Admit date: 09/12/2019 Discharge date: 09/16/2019  Admitted From: Home Disposition: Home  Recommendations for Outpatient Follow-up:  1. Follow up with PCP in 1-2 weeks 2. Outpatient referral to pulmonary.  (Dr. Lanney Gins).  Office will call in 1-2 weeks.  Patient needs repeat CT chest in about 3 months to ensure resolution. 3. Patient being discharged on oral Levaquin to complete 7-day course of antibiotic and oral prednisone taper over the next 10-12 days.   Home Health: PT Equipment/Devices: Rolling walker  Discharge Condition: Fair CODE STATUS: Full code Diet recommendation: Regular    Discharge Diagnoses:  Principal Problem:   Community acquired pneumonia  Active Problems:   Acute respiratory failure with hypoxia ([place])   Multilobar lung infiltrate   Hypomagnesemia   Muscle weakness (generalized)   Benign essential hypertension   Hypokalemia  Brief narrative/HPI 64 year old female with history of Sjogren's on methotrexate, fibromyalgia presented with nonproductive cough or shortness of breath for past 4 days.  She also had fever of 103F at home.  She went to [facility] 2 days prior to admission and had a chest x-ray and CT chest and was diagnosed with pneumonia and a UTI.  On reviewing CT chest done at [facility] on 4/27 it comments as diffuse scattered groundglass opacity in bilateral lung parenchyma, more prominent in the right upper lobe with minimal interlobular septal thickening (suggestive of combination of infection and inflammation with underlying pulmonary edema). Also has a trace right pleural effusion with bilateral hilar and mediastinal lymphadenopathy (likely reactive).  She was prescribed Z-Pak and amoxicillin and discharged home.  Despite antibiotic she continued to have fevers and shortness of breath.  Her son brought her pulse ox and found to have  her O2 sat in the 70s. Patient presented to the ED where her O2 sat was in the 80s and was increasingly short of breath. Patient admitted for acute hypoxic respiratory failure secondary to lobar pneumonia.  Patient was tested negative for COVID-19 PCR on 4/18 and 4/27 at [facility].  Reports being vaccinated for 2 doses over 1 month back.  Hospital course  Principal Problem: Acute respiratory failure with hypoxia ([place]) Secondary to multilobar pneumonia with diffuse bilateral groundglass opacity seen on CT chest.  Suspect both infectious and inflammatory lung disease.    Blood cultures without any growth, urine strep and Legionella antigen also negative. 2D echo with normal EF, no wall motion abnormality or pulmonary artery hypertension.  Antibiotic narrowed to IV cefepime.  Given bilateral lung infiltrates with groundglass opacity added IV Solu-Medrol with symptomatic improvement.  Respiratory symptoms have markedly improved and patient maintaining sats on room air.  Will prescribe oral Levaquin to complete 7-day course of antibiotic and discharge her on oral prednisone taper over the next 12 days.  Prescribed antitussives.  Given extensive lung involvement, bilateral hilar and mediastinal lymphadenopathy needs follow-up CT chest in about 3 months to ensure resolution.  I have discussed the case with pulmonologist Dr. Lanney Gins who recommends outpatient follow-up with him.  Active Problems: Hypokalemia/hypomagnesemia Replenished  History of migraine Continue Fioricet  GERD Continue PPI  Essential hypertension Blood pressure stable.  HCTZ was recently discontinued by PCP.  History of Sjogren's Follows with rheumatology at [facility].  Patient reports her methotrexate were held by her rheumatologist due to?  Hypokalemia.  Resume as outpatient.  Generalized weakness Seen by PT and recommends home health.  Family communication: Husband at bedside Procedure:  CT chest, 2D  echo Disposition: Home    Discharge Instructions   Allergies as of 09/16/2019      Reactions   Hydrocodone-acetaminophen    Other reaction(s): Other (See Comments) Severe headache   Hydrocodone-acetaminophen    Other reaction(s): Headache, Other (See Comments) Severe headache Severe headache Other reaction(s): Headache Severe headache Other reaction(s): Headache, Other (See Comments) Severe headache Severe headache   Morphine Itching, Other (See Comments)   Other reaction(s): Other (See Comments) Morphine in high doses causes itching & red face. Morphine in high doses causes itching & red face. Morphine in high doses causes itching & red face. Other reaction(s): Other (See Comments) Morphine in high doses causes itching & red face. Morphine in high doses causes itching & red face. Morphine in high doses causes itching & red face.   Morphine And Related Itching   Morphine in high doses causes itching & red face.   Cymbalta [duloxetine Hcl] Other (See Comments)   Serotonin Reaction   Duloxetine Other (See Comments)   Other reaction(s): Other (See Comments), Other (See Comments) Serotonin syndrome Serotonin reaction Serotonin syndrome Other reaction(s): Other (See Comments), Other (See Comments) Serotonin syndrome Serotonin reaction   Pregabalin Other (See Comments)   Other reaction(s): Other (See Comments) Serotinon reaction serotinon reaction Serotinon reaction Other reaction(s): Other (See Comments) Serotinon reaction serotinon reaction      Medication List    STOP taking these medications   amoxicillin 500 MG capsule Commonly known as: AMOXIL   azithromycin 250 MG tablet Commonly known as: ZITHROMAX  traZODone 50 MG tablet Commonly known as: DESYREL ( has been discontinued by PCP)  hydrochlorothiazide 25 MG tablet Commonly known as: HYDRODIURIL ( Discontinued by PCP)     TAKE these medications   aspirin-acetaminophen-caffeine 250-250-65 MG  tablet Commonly known as: EXCEDRIN MIGRAINE Take by mouth.   butalbital-acetaminophen-caffeine 50-325-40 MG tablet Commonly known as: FIORICET Take 1 tablet by mouth every 6 (six) hours as needed for headache.   cetirizine 10 MG tablet Commonly known as: ZYRTEC Take 10 mg by mouth daily.   chlorpheniramine-HYDROcodone 10-8 MG/5ML Suer Commonly known as: TUSSIONEX Take 5 mLs by mouth every 12 (twelve) hours.   clobetasol cream 0.05 % Commonly known as: TEMOVATE Apply topically.   cyanocobalamin 1000 MCG tablet Take 1,000 mcg by mouth daily.   esomeprazole 40 MG capsule Commonly known as: NEXIUM Take 40 mg by mouth daily. bEFORE BREAKFAST   fexofenadine 180 MG tablet Commonly known as: ALLEGRA Take 180 mg by mouth daily.   folic acid 1 MG tablet Commonly known as: FOLVITE Take 1 mg by mouth daily.   gabapentin 300 MG capsule Commonly known as: NEURONTIN Take 900 mg by mouth at bedtime.      hydrOXYzine 10 MG tablet Commonly known as: ATARAX/VISTARIL Take 10 mg by mouth every 8 (eight) hours as needed.   hyoscyamine 0.125 MG tablet Commonly known as: LEVSIN Take 0.125 mg by mouth every 4 (four) hours as needed.   levofloxacin 750 MG tablet Commonly known as: Levaquin Take 1 tablet (750 mg total) by mouth daily for 2 days. Start taking on: Sep 17, 2019   methocarbamol 500 MG tablet Commonly known as: ROBAXIN Take 500 mg by mouth at bedtime.   methotrexate 2.5 MG tablet Commonly known as: RHEUMATREX Take 25 mg by mouth once a week. Take 5 tablets by mouth in the morning and 5 tablets by mouth in the evening on Saturdays   pilocarpine 5 MG tablet Commonly known  as: SALAGEN Take 5 mg by mouth 3 (three) times daily.   predniSONE 20 MG tablet Commonly known as: DELTASONE Take 1 tablet (20 mg total) by mouth daily with breakfast.   promethazine 25 MG tablet Commonly known as: PHENERGAN Take 25 mg by mouth every 6 (six) hours as needed for nausea or  vomiting.   rizatriptan 10 MG tablet Commonly known as: MAXALT Take 10 mg by mouth as needed for migraine. May repeat in 2 hours if needed   rOPINIRole 0.5 MG tablet Commonly known as: REQUIP Take 1 tablet by mouth at bedtime.   saccharomyces boulardii 250 MG capsule Commonly known as: FLORASTOR Take 1 capsule (250 mg total) by mouth 2 (two) times daily.   topiramate 100 MG tablet Commonly known as: TOPAMAX Take 200 mg by mouth daily.      valACYclovir 1000 MG tablet Commonly known as: VALTREX Take 1 tablet once a day by mouth   VITAMIN D (CHOLECALCIFEROL) PO Take by mouth.      Follow-up Information    Bowen, Lauren, MD Follow up in 1 week(s).   Specialty: Family Medicine Contact information: [address] [facility] [place] [address] [phone]        Vida Rigger, MD Follow up in 2 week(s).   Specialty: Pulmonary Disease Contact information: [address] [place] [address] [phone]          Allergies  Allergen Reactions  . Hydrocodone-Acetaminophen     Other reaction(s): Other (See Comments) Severe headache  . Hydrocodone-Acetaminophen     Other reaction(s): Headache, Other (See Comments) Severe headache Severe headache  Other reaction(s): Headache Severe headache Other reaction(s): Headache, Other (See Comments) Severe headache Severe headache  . Morphine Itching and Other (See Comments)    Other reaction(s): Other (See Comments) Morphine in high doses causes itching & red face. Morphine in high doses causes itching & red face.  Morphine in high doses causes itching & red face. Other reaction(s): Other (See Comments) Morphine in high doses causes itching & red face. Morphine in high doses causes itching & red face. Morphine in high doses causes itching & red face.  . Morphine And Related Itching    Morphine in high doses causes itching & red face.   Marland Kitchen Cymbalta [Duloxetine Hcl]  Other (See Comments)    Serotonin Reaction  . Duloxetine Other (See Comments)    Other reaction(s): Other (See Comments), Other (See Comments) Serotonin syndrome Serotonin reaction  Serotonin syndrome Other reaction(s): Other (See Comments), Other (See Comments) Serotonin syndrome Serotonin reaction  . Pregabalin Other (See Comments)    Other reaction(s): Other (See Comments) Serotinon reaction serotinon reaction  Serotinon reaction Other reaction(s): Other (See Comments) Serotinon reaction serotinon reaction      Procedures/Studies: DG Chest Portable 1 View  Result Date: 09/12/2019 CLINICAL DATA:  Patient with low oxygen saturation. EXAM: PORTABLE CHEST 1 VIEW COMPARISON:  Chest radiograph 02/23/2018. FINDINGS: Monitoring leads overlie the patient. Patchy consolidative opacity within the right mid lung. No pleural effusion or pneumothorax. Osseous structures unremarkable. IMPRESSION: Patchy right midlung opacity may represent pneumonia in the appropriate clinical setting. Followup PA and lateral chest X-ray is recommended in 3-4 weeks following trial of antibiotic therapy to ensure resolution and exclude underlying malignancy. Electronically Signed   By: Annia Belt M.D.   On: 09/12/2019 19:32   DG Humerus Right  Result Date: 09/12/2019 CLINICAL DATA:  Lytic lesion of the bone EXAM: RIGHT HUMERUS - 2+ VIEW  COMPARISON:  None. FINDINGS: There is a small 1 cm well circumscribed lucent lesion seen within the proximal humerus which appears to be present on exam dating back to to August 24, 2017. No associated cortical breakthrough or periosteal reaction. No new osseous lesions are seen. 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   On: 09/12/2019 23:00   ECHOCARDIOGRAM COMPLETE  Result Date: 09/13/2019    ECHOCARDIOGRAM REPORT   Patient Name:   YOANA STAIB Date of Exam: 09/13/2019 Medical Rec #:  [id number]               Height:       62.5 in Accession #:    [id number]              Weight:       188.1 lb Date of Birth:  [birth year]              BSA:          1.873 m Patient Age:    63 years                BP:           122/62 mmHg Patient Gender: F                       HR:           88 bpm. Exam Location:  [facility] Procedure: 2D Echo, Cardiac Doppler and Color Doppler Indications:     Acute respiratory in sufficiency 518.82  History:         Patient has no prior history of Echocardiogram examinations.                  Migraines.  Sonographer:     Cristela Blue RDCS (AE) Referring Phys:  [id number] [facility] Diagnosing Phys: Adrian Blackwater MD  Sonographer Comments: Suboptimal apical window. IMPRESSIONS  1. Left ventricular ejection fraction, by estimation, is 60 to 65%. The left ventricle has normal function. The left ventricle has no regional wall motion abnormalities. Left ventricular diastolic parameters are consistent with Grade I diastolic dysfunction (impaired relaxation).  2. Right ventricular systolic function is normal. The right ventricular size is normal. There is normal pulmonary artery systolic pressure.  3. The mitral valve is normal in structure. Trivial mitral valve regurgitation. No evidence of mitral stenosis.  4. The aortic valve is normal in structure. Aortic valve regurgitation is not visualized. No aortic stenosis is present.  5. The inferior vena cava is normal in size with greater than 50% respiratory variability, suggesting right atrial pressure of 3 mmHg. FINDINGS  Left Ventricle: Left ventricular ejection fraction, by estimation, is 60 to 65%. The left ventricle has normal function. The left ventricle has no regional wall motion abnormalities. The left ventricular internal cavity size was normal in size. There is  no left ventricular hypertrophy. Left ventricular diastolic parameters are consistent with Grade I diastolic dysfunction  (impaired relaxation). Right Ventricle: The right ventricular size is normal. No increase in right ventricular wall thickness. Right ventricular systolic function is normal. There is normal pulmonary artery systolic pressure. The tricuspid regurgitant velocity is 2.52 m/s, and  with an assumed right atrial pressure of 10 mmHg, the estimated right ventricular systolic pressure is 35.4 mmHg. Left Atrium: Left atrial size was normal in size. Right Atrium: Right atrial  size was normal in size. Pericardium: There is no evidence of pericardial effusion. Mitral Valve: The mitral valve is normal in structure. Normal mobility of the mitral valve leaflets. Trivial mitral valve regurgitation. No evidence of mitral valve stenosis. Tricuspid Valve: The tricuspid valve is normal in structure. Tricuspid valve regurgitation is trivial. No evidence of tricuspid stenosis. Aortic Valve: The aortic valve is normal in structure. Aortic valve regurgitation is not visualized. No aortic stenosis is present. Aortic valve mean gradient measures 5.0 mmHg. Aortic valve peak gradient measures 9.9 mmHg. Aortic valve area, by VTI measures 1.93 cm. Pulmonic Valve: The pulmonic valve was normal in structure. Pulmonic valve regurgitation is not visualized. No evidence of pulmonic stenosis. Aorta: The aortic root is normal in size and structure. Venous: The inferior vena cava is normal in size with greater than 50% respiratory variability, suggesting right atrial pressure of 3 mmHg. IAS/Shunts: No atrial level shunt detected by color flow Doppler.  LEFT VENTRICLE PLAX 2D LVIDd:         4.72 cm  Diastology LVIDs:         2.83 cm  LV e' lateral:   10.60 cm/s LV PW:         1.06 cm  LV E/e' lateral: 6.8 LV IVS:        0.64 cm  LV e' medial:    8.92 cm/s LVOT diam:     2.00 cm  LV E/e' medial:  8.1 LV SV:         49 LV SV Index:   26 LVOT Area:     3.14 cm  RIGHT VENTRICLE RV Basal diam:  3.28 cm RV S prime:     14.90 cm/s TAPSE (M-mode): 3.5 cm LEFT  ATRIUM             Index       RIGHT ATRIUM           Index LA diam:        3.50 cm 1.87 cm/m  RA Area:     14.80 cm LA Vol (A2C):   63.3 ml 33.79 ml/m RA Volume:   32.10 ml  17.13 ml/m LA Vol (A4C):   71.6 ml 38.22 ml/m LA Biplane Vol: 70.0 ml 37.36 ml/m  AORTIC VALVE                    PULMONIC VALVE AV Area (Vmax):    1.97 cm     PV Vmax:        0.89 m/s AV Area (Vmean):   1.72 cm     PV Peak grad:   3.2 mmHg AV Area (VTI):     1.93 cm     RVOT Peak grad: 3 mmHg AV Vmax:           157.50 cm/s AV Vmean:          103.150 cm/s AV VTI:            0.252 m AV Peak Grad:      9.9 mmHg AV Mean Grad:      5.0 mmHg LVOT Vmax:         98.60 cm/s LVOT Vmean:        56.400 cm/s LVOT VTI:          0.155 m LVOT/AV VTI ratio: 0.61  AORTA Ao Root diam: 3.00 cm MITRAL VALVE               TRICUSPID VALVE MV Area (  PHT): 5.27 cm    TR Peak grad:   25.4 mmHg MV Decel Time: 144 msec    TR Vmax:        252.00 cm/s MV E velocity: 72.40 cm/s MV A velocity: 93.40 cm/s  SHUNTS MV E/A ratio:  0.78        Systemic VTI:  0.16 m                            Systemic Diam: 2.00 cm Adrian Blackwater MD Electronically signed by Adrian Blackwater MD Signature Date/Time: 09/13/2019/11:31:45 AM    Final        Subjective: Cough improving and breathing much better. Discharge Exam: Vitals:   09/15/19 2324 09/16/19 0757  BP: 111/65 124/90  Pulse: 77 70  Resp: 18   Temp: 98 F (36.7 C) 98 F (36.7 C)  SpO2: 95% 100%   Vitals:   09/15/19 0821 09/15/19 1608 09/15/19 2324 09/16/19 0757  BP: 121/69 109/65 111/65 124/90  Pulse: 71 74 77 70  Resp: 18 17 18    Temp: 98.4 F (36.9 C) 97.7 F (36.5 C) 98 F (36.7 C) 98 F (36.7 C)  TempSrc: Oral Oral Oral Oral  SpO2: 100% 97% 95% 100%  Weight:      Height:        General: Middle-aged female not in distress HEENT: Moist mucosa, supple neck Chest: Clear bilaterally, no added sounds CVs: Normal S1-S2, no murmurs GI: Soft, nondistended, nontender Musculoskeletal: Warm, no  edema    The results of significant diagnostics from this hospitalization (including imaging, microbiology, ancillary and laboratory) are listed below for reference.     Microbiology: Recent Results (from the past 240 hour(s))  Culture, blood (Routine X 2) w Reflex to ID Panel     Status: None (Preliminary result)   Collection Time: 09/12/19  9:54 PM   Specimen: BLOOD LEFT HAND  Result Value Ref Range Status   Specimen Description BLOOD LEFT HAND  Final   Special Requests   Final    BOTTLES DRAWN AEROBIC AND ANAEROBIC Blood Culture adequate volume   Culture   Final    NO GROWTH 4 DAYS Performed at [facility], [address], [place], [place]    Report Status PENDING  Incomplete  Culture, blood (Routine X 2) w Reflex to ID Panel     Status: None (Preliminary result)   Collection Time: 09/12/19 10:13 PM   Specimen: BLOOD RIGHT HAND  Result Value Ref Range Status   Specimen Description BLOOD RIGHT HAND  Final   Special Requests   Final    BOTTLES DRAWN AEROBIC AND ANAEROBIC Blood Culture adequate volume   Culture   Final    NO GROWTH 4 DAYS Performed at [facility], [address], [place], [place]    Report Status PENDING  Incomplete  MRSA PCR Screening     Status: Abnormal   Collection Time: 09/13/19  2:37 PM   Specimen: Nasopharyngeal  Result Value Ref Range Status   MRSA by PCR POSITIVE (A) NEGATIVE Final    Comment:        The GeneXpert MRSA Assay (FDA approved for NASAL specimens only), is one component of a comprehensive MRSA colonization surveillance program. It is not intended to diagnose MRSA infection nor to guide or monitor treatment for MRSA infections. RESULT CALLED TO, READ BACK BY AND VERIFIED WITH: ANNA RODGRIGUEZ @1622  09/13/19 MJU Performed at  [facility], [address] [place], [place] [address]      Labs: BNP (last 3 results) Recent Labs    09/12/19 1945  BNP 37.0   Basic Metabolic  Panel: Recent Labs  Lab 09/12/19 1945 09/12/19 2212 09/13/19 0529 09/14/19 0509 09/15/19 0518 09/16/19 0313  NA 130*  --  137 137  --   --   K 3.0*  --  3.3* 4.1  --   --   CL 98  --  104 106  --   --   CO2 21*  --  24 24  --   --   GLUCOSE 92  --  94 124*  --   --   BUN 11  --  9 11  --   --   CREATININE 1.07*  --  0.81 0.63 0.75 0.72  CALCIUM 8.5*  --  8.6* 8.8*  --   --   MG  --  1.6* 2.2  --   --   --    Liver Function Tests: No results for input(s): AST, ALT, ALKPHOS, BILITOT, PROT, ALBUMIN in the last 168 hours. No results for input(s): LIPASE, AMYLASE in the last 168 hours. No results for input(s): AMMONIA in the last 168 hours. CBC: Recent Labs  Lab 09/12/19 1945 09/13/19 0529  WBC 6.9 5.4  HGB 10.5* 11.3*  HCT 31.3* 34.1*  MCV 83.5 84.8  PLT 208 205   Cardiac Enzymes: No results for input(s): CKTOTAL, CKMB, CKMBINDEX, TROPONINI in the last 168 hours. BNP: Invalid input(s): POCBNP CBG: Recent Labs  Lab 09/13/19 2120  GLUCAP 116*   D-Dimer No results for input(s): DDIMER in the last 72 hours. Hgb A1c No results for input(s): HGBA1C in the last 72 hours. Lipid Profile No results for input(s): CHOL, HDL, LDLCALC, TRIG, CHOLHDL, LDLDIRECT in the last 72 hours. Thyroid function studies No results for input(s): TSH, T4TOTAL, T3FREE, THYROIDAB in the last 72 hours.  Invalid input(s): FREET3 Anemia work up No results for input(s): VITAMINB12, FOLATE, FERRITIN, TIBC, IRON, RETICCTPCT in the last 72 hours. Urinalysis    Component Value Date/Time   COLORURINE YELLOW (A) 01/27/2019 1731   APPEARANCEUR CLEAR (A) 01/27/2019 1731   LABSPEC 1.016 01/27/2019 1731   PHURINE 5.0 01/27/2019 1731   GLUCOSEU NEGATIVE 01/27/2019 1731   HGBUR SMALL (A) 01/27/2019 1731   BILIRUBINUR NEGATIVE 01/27/2019 1731   KETONESUR NEGATIVE 01/27/2019 1731   PROTEINUR NEGATIVE 01/27/2019 1731   NITRITE NEGATIVE 01/27/2019 1731   LEUKOCYTESUR TRACE (A) 01/27/2019 1731   Sepsis  Labs Invalid input(s): PROCALCITONIN,  WBC,  LACTICIDVEN Microbiology Recent Results (from the past 240 hour(s))  Culture, blood (Routine X 2) w Reflex to ID Panel     Status: None (Preliminary result)   Collection Time: 09/12/19  9:54 PM   Specimen: BLOOD LEFT HAND  Result Value Ref Range Status   Specimen Description BLOOD LEFT HAND  Final   Special Requests   Final    BOTTLES DRAWN AEROBIC AND ANAEROBIC Blood Culture adequate volume   Culture   Final    NO GROWTH 4 DAYS Performed at [facility] [address] [place], [place] [address]    Report Status PENDING  Incomplete  Culture, blood (Routine X 2) w Reflex to ID Panel     Status: None (Preliminary result)   Collection Time: 09/12/19 10:13 PM   Specimen: BLOOD RIGHT HAND  Result Value Ref Range Status   Specimen Description BLOOD RIGHT HAND  Final  Special Requests   Final    BOTTLES DRAWN AEROBIC AND ANAEROBIC Blood Culture adequate volume   Culture   Final    NO GROWTH 4 DAYS Performed at [facility], [address] [place], [place] [address]    Report Status PENDING  Incomplete  MRSA PCR Screening     Status: Abnormal   Collection Time: 09/13/19  2:37 PM   Specimen: Nasopharyngeal  Result Value Ref Range Status   MRSA by PCR POSITIVE (A) NEGATIVE Final    Comment:        The GeneXpert MRSA Assay (FDA approved for NASAL specimens only), is one component of a comprehensive MRSA colonization surveillance program. It is not intended to diagnose MRSA infection nor to guide or monitor treatment for MRSA infections. RESULT CALLED TO, READ BACK BY AND VERIFIED WITH: ANNA RODGRIGUEZ @1622  09/13/19 MJU Performed at [facility], [address] [place], [place] [address]      Time coordinating discharge: 35 minutes  SIGNED:   Eddie NorthNishant Tanetta Fuhriman, MD  [facility] 09/16/2019, 9:43 AM Pager   If 7PM-7AM, please contact night-coverage [email or web address] Password [email or web address]

## 2019-09-16 NOTE — Discharge Instructions (Signed)
Community-Acquired Pneumonia, Adult Pneumonia is an infection of the lungs. It causes swelling in the airways of the lungs. Mucus and fluid may also build up inside the airways. One type of pneumonia can happen while a person is in a hospital. A different type can happen when a person is not in a hospital (community-acquired pneumonia).  What are the causes?  This condition is caused by germs (viruses, bacteria, or fungi). Some types of germs can be passed from one person to another. This can happen when you breathe in droplets from the cough or sneeze of an infected person. What increases the risk? You are more likely to develop this condition if you:  Have a long-term (chronic) disease, such as: ? Chronic obstructive pulmonary disease (COPD). ? Asthma. ? Cystic fibrosis. ? Congestive heart failure. ? Diabetes. ? Kidney disease.  Have HIV.  Have sickle cell disease.  Have had your spleen removed.  Do not take good care of your teeth and mouth (poor dental hygiene).  Have a medical condition that increases the risk of breathing in droplets from your own mouth and nose.  Have a weakened body defense system (immune system).  Are a smoker.  Travel to areas where the germs that cause this illness are common.  Are around certain animals or the places they live. What are the signs or symptoms?  A dry cough.  A wet (productive) cough.  Fever.  Sweating.  Chest pain. This often happens when breathing deeply or coughing.  Fast breathing or trouble breathing.  Shortness of breath.  Shaking chills.  Feeling tired (fatigue).  Muscle aches. How is this treated? Treatment for this condition depends on many things. Most adults can be treated at home. In some cases, treatment must happen in a hospital. Treatment may include:  Medicines given by mouth or through an IV tube.  Being given extra oxygen.  Respiratory therapy. In rare cases, treatment for very bad pneumonia  may include:  Using a machine to help you breathe.  Having a procedure to remove fluid from around your lungs. Follow these instructions at home: Medicines  Take over-the-counter and prescription medicines only as told by your doctor. ? Only take cough medicine if you are losing sleep.  If you were prescribed an antibiotic medicine, take it as told by your doctor. Do not stop taking the antibiotic even if you start to feel better. General instructions   Sleep with your head and neck raised (elevated). You can do this by sleeping in a recliner or by putting a few pillows under your head.  Rest as needed. Get at least 8 hours of sleep each night.  Drink enough water to keep your pee (urine) pale yellow.  Eat a healthy diet that includes plenty of vegetables, fruits, whole grains, low-fat dairy products, and lean protein.  Do not use any products that contain nicotine or tobacco. These include cigarettes, e-cigarettes, and chewing tobacco. If you need help quitting, ask your doctor.  Keep all follow-up visits as told by your doctor. This is important. How is this prevented? A shot (vaccine) can help prevent pneumonia. Shots are often suggested for:  People older than 63 years of age.  People older than 63 years of age who: ? Are having cancer treatment. ? Have long-term (chronic) lung disease. ? Have problems with their body's defense system. You may also prevent pneumonia if you take these actions:  Get the flu (influenza) shot every year.  Go to the dentist as   often as told.  Wash your hands often. If you cannot use soap and water, use hand sanitizer. Contact a doctor if:  You have a fever.  You lose sleep because your cough medicine does not help. Get help right away if:  You are short of breath and it gets worse.  You have more chest pain.  Your sickness gets worse. This is very serious if: ? You are an older adult. ? Your body's defense system is weak.  You  cough up blood. Summary  Pneumonia is an infection of the lungs.  Most adults can be treated at home. Some will need treatment in a hospital.  Drink enough water to keep your pee pale yellow.  Get at least 8 hours of sleep each night. This information is not intended to replace advice given to you by your health care provider. Make sure you discuss any questions you have with your health care provider. Document Revised: 08/23/2018 Document Reviewed: 12/29/2017 Elsevier Patient Education  2020 Elsevier Inc.  

## 2019-09-16 NOTE — TOC Transition Note (Signed)
Transition of Care [facility]) - CM/SW Discharge Note   Patient Details  Name: Amy Huynh MRN: [id number] Date of Birth: 08/06/1955  Transition of Care [facility]) CM/SW Contact:  Maud Deed, LCSW Phone Number: 09/16/2019, 10:54 AM   Clinical Narrative:    Pt medially stable for discharge per MD. Pt will ne transported home by her Spouse. CSW spok eKemdra Stone with [facility] to setup [facility] PT. [facility] to follow for PT needs. CSW contacted Brad for DME needs. Pt needs RW and CSw will deliver to pt's room.    Final next level of care: Home w Home Health Services Barriers to Discharge: No Barriers Identified   Patient Goals and CMS Choice   CMS Medicare.gov Compare Post Acute Care list provided to:: Patient Choice offered to / list presented to : Patient  Discharge Placement                Patient to be transferred to facility by: Spouse Name of family member notified: Sharlot Gowda Patient and family notified of of transfer: 09/16/19  Discharge Plan and Services                DME Arranged: Dan Humphreys rolling DME Agency: [employer] Date DME Agency Contacted: 09/16/19 Time DME Agency Contacted: 1051 Representative spoke with at DME Agency: Nida Boatman HH Arranged: PT HH Agency: [facility] Date [facility] Agency Contacted: 09/16/19 Time HH Agency Contacted: 1053 Representative spoke with at [facility] Agency: Nida Boatman  Social Determinants of Health (SDOH) Interventions     Readmission Risk Interventions No flowsheet data found.

## 2019-09-16 NOTE — Progress Notes (Signed)
Pt is being discharged home.  Discharge papers given and explained to pt.  Pt verbalized understanding.  Meds and f/u appointments reviewed. Rx given.  

## 2019-09-17 LAB — CULTURE, BLOOD (ROUTINE X 2)
Culture: NO GROWTH
Culture: NO GROWTH
Special Requests: ADEQUATE
Special Requests: ADEQUATE

## 2019-10-18 ENCOUNTER — Encounter: Payer: Self-pay | Admitting: Emergency Medicine

## 2019-10-18 ENCOUNTER — Other Ambulatory Visit: Payer: Self-pay

## 2019-10-18 ENCOUNTER — Ambulatory Visit
Admission: EM | Admit: 2019-10-18 | Discharge: 2019-10-18 | Disposition: A | Discharge status: Home / Self Care | Payer: Medicare PPO | Attending: Urgent Care | Admitting: Urgent Care

## 2019-10-18 DIAGNOSIS — R102 Pelvic and perineal pain: Secondary | ICD-10-CM | POA: Diagnosis present

## 2019-10-18 DIAGNOSIS — R300 Dysuria: Secondary | ICD-10-CM | POA: Diagnosis present

## 2019-10-18 DIAGNOSIS — M545 Low back pain, unspecified: Secondary | ICD-10-CM

## 2019-10-18 LAB — URINALYSIS, COMPLETE (UACMP) WITH MICROSCOPIC
Bacteria, UA: NONE SEEN
Bilirubin Urine: NEGATIVE
Glucose, UA: NEGATIVE mg/dL
Ketones, ur: NEGATIVE mg/dL
Leukocytes,Ua: NEGATIVE
Nitrite: NEGATIVE
Protein, ur: NEGATIVE mg/dL
Specific Gravity, Urine: 1.025 (ref 1.005–1.030)
WBC, UA: NONE SEEN WBC/hpf (ref 0–5)
pH: 5 (ref 5.0–8.0)

## 2019-10-18 NOTE — ED Triage Notes (Signed)
Patient in today c/o flank pain, dysuria and abdominal pain x 4-5 days. Patient took an OTC urine test at home and it was positive for leukocytes. Patient has not taken any OTC medications.

## 2019-10-18 NOTE — ED Provider Notes (Signed)
[place], [place]   Name: Amy Huynh DOB: 1955/07/27 MRN: [id number] CSN: [id number] PCP: Oletha Blend, MD  Arrival date and time:  10/18/19 0835  Chief Complaint:  Flank Pain, Dysuria, and Abdominal Pain  NOTE: Prior to seeing the patient today, I have reviewed the triage nursing documentation and vital signs. Clinical staff has updated patient's PMH/PSHx, current medication list, and drug allergies/intolerances to ensure comprehensive history available to assist in medical decision making.   History:   HPI: Amy Huynh is a 64 y.o. female who presents today with complaints of urinary symptoms that began with acute onset 4-5 days ago. She complains of dysuria, frequency, and urgency. She has not appreciated any gross hematuria, nor has she noticed her urine being malodorous. Patient denies any associated nausea, vomiting, fever, or chills. She has pain in her lower back, flank areas, and suprapubic abdomen. Patient advises that she does not have a past medical history that is significant for recurrent urinary tract infections. She denies any vaginal pain, bleeding, or discharge. LNBM was today. Patient reports that she has taken an over the counter urine test at home that showed that her sample was (+) for leukocytes. Patient states, "this feels like a UTI. I have had them in the past". Despite her symptoms, patient has not taken any over the counter interventions to help improve/relieve her reported symptoms at home.   Past Medical History:  Diagnosis Date  . Arthritis   . Family history of ovarian cancer 12/2016   MyRisk neg  . Fibromyalgia   . Migraine   . Neuropathy   . Placenta previa   . Sjogren's disease [facility])     Past Surgical History:  Procedure Laterality Date  . CERVICAL FUSION    . CESAREAN SECTION    . CHOLECYSTECTOMY    . HERNIA REPAIR    . REPLACEMENT TOTAL KNEE Left   . SHOULDER SURGERY Right     Family History  Problem Relation Age of  Onset  . Ovarian cancer Maternal Aunt 30  . Uterine cancer Mother        24s  . COPD Mother   . Hypertension Mother   . Uterine cancer Maternal Grandmother        ? age  . Heart attack Father 30  . Other Father        DDD    Social History   Tobacco Use  . Smoking status: Never Smoker  . Smokeless tobacco: Never Used  Substance Use Topics  . Alcohol use: Yes    Comment: social  . Drug use: Never    Patient Active Problem List   Diagnosis Date Noted  . Acute respiratory failure with hypoxia (HCC) 09/16/2019  . Multilobar lung infiltrate 09/16/2019  . Hypomagnesemia 09/16/2019  . Muscle weakness (generalized) 09/16/2019  . Community acquired pneumonia 09/12/2019  . Hypokalemia 09/12/2019  . Family history of ovarian cancer 01/11/2017  . Acute bursitis of right shoulder 06/07/2016  . Partial tear of right rotator cuff 06/07/2016  . Lumbar radiculopathy 07/23/2015  . Migraine without aura and without status migrainosus, not intractable 04/23/2015  . Chronic daily headache 04/23/2015  . DDD (degenerative disc disease), cervical 07/06/2014  . Neck pain 07/06/2014  . Dyspareunia 12/14/2013  . Benign essential hypertension 09/13/2013  . Polyarthralgia 07/20/2013  . Diverticula of colon 01/22/2013  . Fibromyalgia 01/22/2013  . Restless legs syndrome 01/22/2013  . Moderate obstructive sleep apnea 01/22/2013  . Migraine headache 01/22/2013  . Insomnia 01/22/2013  .  Generalized anxiety disorder 01/22/2013  . Esophageal reflux 01/22/2013  . Atrophic vaginitis 01/22/2013  . Anemia 01/22/2013  . Allergic rhinitis 01/22/2013  . Urticaria 01/18/2013  . Low back pain 10/03/2012  . Lumbosacral spondylosis 08/17/2012    Home Medications:    Current Meds  Medication Sig  . aspirin-acetaminophen-caffeine (EXCEDRIN MIGRAINE) 250-250-65 MG tablet Take by mouth.  . butalbital-acetaminophen-caffeine (FIORICET, ESGIC) 50-325-40 MG tablet Take 1 tablet by mouth every 6 (six) hours  as needed for headache.  . cetirizine (ZYRTEC) 10 MG tablet Take 10 mg by mouth daily.  . clobetasol cream (TEMOVATE) 0.05 % Apply topically.  . cyanocobalamin 1000 MCG tablet Take 1,000 mcg by mouth daily.  Marland Kitchen esomeprazole (NEXIUM) 40 MG capsule Take 40 mg by mouth daily. bEFORE BREAKFAST  . fexofenadine (ALLEGRA) 180 MG tablet Take 180 mg by mouth daily.  . folic acid (FOLVITE) 1 MG tablet Take 1 mg by mouth daily.  Marland Kitchen gabapentin (NEURONTIN) 300 MG capsule Take 900 mg by mouth at bedtime.  . hydrOXYzine (ATARAX/VISTARIL) 10 MG tablet Take 10 mg by mouth every 8 (eight) hours as needed.  . hyoscyamine (LEVSIN, ANASPAZ) 0.125 MG tablet Take 0.125 mg by mouth every 4 (four) hours as needed.  . methocarbamol (ROBAXIN) 500 MG tablet Take 500 mg by mouth at bedtime.   . methotrexate (RHEUMATREX) 2.5 MG tablet Take 25 mg by mouth once a week. Take 5 tablets by mouth in the morning and 5 tablets by mouth in the evening on Saturdays  . pilocarpine (SALAGEN) 5 MG tablet Take 5 mg by mouth 3 (three) times daily.  . promethazine (PHENERGAN) 25 MG tablet Take 25 mg by mouth every 6 (six) hours as needed for nausea or vomiting.  . rizatriptan (MAXALT) 10 MG tablet Take 10 mg by mouth as needed for migraine. May repeat in 2 hours if needed  . rOPINIRole (REQUIP) 0.5 MG tablet Take 1 tablet by mouth at bedtime.   . saccharomyces boulardii (FLORASTOR) 250 MG capsule Take 1 capsule (250 mg total) by mouth 2 (two) times daily.  Marland Kitchen topiramate (TOPAMAX) 100 MG tablet Take 200 mg by mouth daily.  . valACYclovir (VALTREX) 1000 MG tablet Take 1 tablet once a day by mouth  . VITAMIN D, CHOLECALCIFEROL, PO Take by mouth.    Allergies:   Hydrocodone-acetaminophen, Hydrocodone-acetaminophen, Morphine, Morphine and related, Cymbalta [duloxetine hcl], Duloxetine, and Pregabalin  Review of Systems (ROS):  Review of systems NEGATIVE unless otherwise noted in narrative H&P section.   Vital Signs: Today's Vitals    10/18/19 0846 10/18/19 0847  BP: (!) 156/83   Pulse: 87   Resp: 18   Temp: 98.3 F (36.8 C)   TempSrc: Oral   SpO2: 99%   Weight:  179 lb (81.2 kg)  Height:  5' 2.5" (1.588 m)  PainSc: 8      Physical Exam: Physical Exam  Constitutional: She is oriented to person, place, and time and well-developed, well-nourished, and in no distress.  HENT:  Head: Normocephalic and atraumatic.  Eyes: Pupils are equal, round, and reactive to light.  Cardiovascular: Normal rate, regular rhythm, normal heart sounds and intact distal pulses.  Pulmonary/Chest: Effort normal and breath sounds normal.  Abdominal: Soft. Normal appearance and bowel sounds are normal. She exhibits no distension. There is abdominal tenderness in the suprapubic area. There is no CVA tenderness.  Musculoskeletal:     Lumbar back: Pain (chronic; has DDD diagnosis) present.  Neurological: She is alert and oriented to person, place,  and time. Gait normal.  Skin: Skin is warm and dry. No rash noted. She is not diaphoretic.  Psychiatric: Mood, memory, affect and judgment normal.  Nursing note and vitals reviewed.   Urgent Care Treatments / Results:   Orders Placed This Encounter  Procedures  . Urinalysis, Complete w Microscopic    LABS: PLEASE NOTE: all labs that were ordered this encounter are listed, however only abnormal results are displayed. Labs Reviewed  URINALYSIS, COMPLETE (UACMP) WITH MICROSCOPIC - Abnormal; Notable for the following components:      Result Value   Hgb urine dipstick TRACE (*)    All other components within normal limits    EKG: -None  RADIOLOGY: No results found.  PROCEDURES: Procedures  MEDICATIONS RECEIVED THIS VISIT: Medications - No data to display  PERTINENT CLINICAL COURSE NOTES/UPDATES:   Initial Impression / Assessment and Plan / Urgent Care Course:  Pertinent labs & imaging results that were available during my care of the patient were personally reviewed by me and  considered in my medical decision making (see lab/imaging section of note for values and interpretations).  Fusako Tanabe Mcallister is a 64 y.o. female who presents to [facility] today with complaints of Flank Pain, Dysuria, and Abdominal Pain  Patient is well appearing overall in clinic today. She does not appear to be in any acute distress. Presenting symptoms (see HPI) and exam as documented above. UA today negative for infection; 0 WBC/hpf, 0-5 RBC/hpf, and no nitrites, LE, or bacteria. Very small sample provided by patient; negative findings could be related to insufficient sample. Discussed other potential causes of her symptoms including urolithiasis (unlikely as there is no blood) and constipation (unlikely as LNBM was today). Imaging of the abdomen offered, however patient declined citing that this "feels more urinary". Patient reporting that her back pain is "more than it normally is". Offered anti-inflammatory medications, which patient advises that she cannot take because of "stomach issues". She takes gabapentin daily for fibromyalgia and her DDD. Offered more potent intervention for back pain, however patient declined. In efforts to prevent patient from having to return, a provisional (wait and see) Rx for SMZ-TMP DS BID x 3 days was provided; hand written. Patient may also use phenazopyridine as needed for her symptoms.    Discussed follow up with primary care physician in 1 week for re-evaluation. I have reviewed the follow up and strict return precautions for any new or worsening symptoms. Patient is aware of symptoms that would be deemed urgent/emergent, and would thus require further evaluation either here or in the emergency department. At the time of discharge, she verbalized understanding and consent with the discharge plan as it was reviewed with her. All questions were fielded by provider and/or clinic staff prior to patient discharge.    Final Clinical Impressions / Urgent  Care Diagnoses:   Final diagnoses:  Dysuria  Bilateral low back pain without sciatica, unspecified chronicity  Suprapubic pain, acute    New Prescriptions:  [place] Controlled Substance Registry consulted? Not Applicable  No orders of the defined types were placed in this encounter.   Recommended Follow up Care:  Patient encouraged to follow up with the following provider within the specified time frame, or sooner as dictated by the severity of her symptoms. As always, she was instructed that for any urgent/emergent care needs, she should seek care either here or in the emergency department for more immediate evaluation.  Follow-up Information    Bowen, Lauren, MD In 1 week.  Specialty: Family Medicine Why: General reassessment of symptoms if not improving Contact information: [address] [facility] [place] [address] [phone]         NOTE: This note was prepared using Dragon dictation software along with smaller phrase technology. Despite my best ability to proofread, there is the potential that transcriptional errors may still occur from this process, and are completely unintentional.    Verlee Monte, NP 10/18/19 [phone]

## 2019-10-18 NOTE — Discharge Instructions (Addendum)
It was very nice seeing you today in clinic. Thank you for entrusting me with your care.   Increase fluid intake. May use over the counter Azo as needed for pain.   Make arrangements to follow up with your regular doctor in 1 week for re-evaluation if not improving. If your symptoms/condition worsens, please seek follow up care either here or in the ER. Please remember, our [facility] providers are "right here with you" when you need [place].   Again, it was my pleasure to take care of you today. Thank you for choosing our clinic. I hope that you start to feel better quickly.   Quentin Mulling, MSN, APRN, FNP-C, CEN Advanced Practice Provider [facility] [place] [facility]

## 2019-11-15 DIAGNOSIS — I517 Cardiomegaly: Secondary | ICD-10-CM | POA: Insufficient documentation

## 2020-01-29 DIAGNOSIS — Z8739 Personal history of other diseases of the musculoskeletal system and connective tissue: Secondary | ICD-10-CM | POA: Insufficient documentation

## 2020-05-22 DIAGNOSIS — R9431 Abnormal electrocardiogram [ECG] [EKG]: Secondary | ICD-10-CM | POA: Insufficient documentation

## 2020-05-29 ENCOUNTER — Other Ambulatory Visit: Payer: Self-pay | Admitting: Internal Medicine

## 2020-06-24 DIAGNOSIS — N1831 Chronic kidney disease, stage 3a: Secondary | ICD-10-CM | POA: Insufficient documentation

## 2020-07-11 ENCOUNTER — Other Ambulatory Visit: Payer: Self-pay | Admitting: Internal Medicine

## 2020-07-11 DIAGNOSIS — R42 Dizziness and giddiness: Secondary | ICD-10-CM

## 2020-07-11 DIAGNOSIS — M542 Cervicalgia: Secondary | ICD-10-CM

## 2020-07-11 DIAGNOSIS — G44319 Acute post-traumatic headache, not intractable: Secondary | ICD-10-CM

## 2020-07-15 DIAGNOSIS — R21 Rash and other nonspecific skin eruption: Secondary | ICD-10-CM | POA: Insufficient documentation

## 2020-07-23 ENCOUNTER — Ambulatory Visit
Admission: RE | Admit: 2020-07-23 | Discharge: 2020-07-23 | Disposition: A | Discharge status: Home / Self Care | Payer: Medicare PPO | Source: Ambulatory Visit | Attending: Internal Medicine | Admitting: Internal Medicine

## 2020-07-23 ENCOUNTER — Other Ambulatory Visit: Payer: Self-pay

## 2020-07-23 DIAGNOSIS — M542 Cervicalgia: Secondary | ICD-10-CM | POA: Insufficient documentation

## 2020-07-23 DIAGNOSIS — R42 Dizziness and giddiness: Secondary | ICD-10-CM

## 2020-07-23 DIAGNOSIS — G44319 Acute post-traumatic headache, not intractable: Secondary | ICD-10-CM | POA: Diagnosis present

## 2020-07-23 IMAGING — CT CT CERVICAL SPINE W/O CM
2 series · 10 of 14 positions shown, 12 images · non-contrast
Comparison: None.

CLINICAL DATA: Fall, hit left side of head

EXAM:
CT CERVICAL SPINE WITHOUT CONTRAST
TECHNIQUE: Multidetector CT imaging of the cervical spine was performed without
intravenous contrast. Multiplanar CT image reconstructions were also
generated.

[Series 3: c spine soft · axial · 0.29mm/px · z∈[-205,-103]mm · 4 of 87 slices shown]
[im 18/87  soft-tissue]
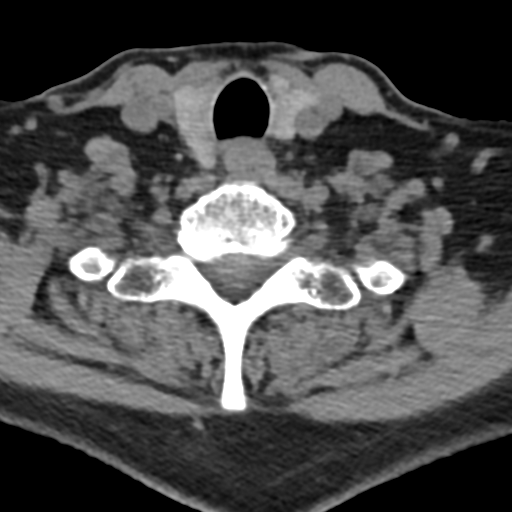
[im 35/87  soft-tissue]
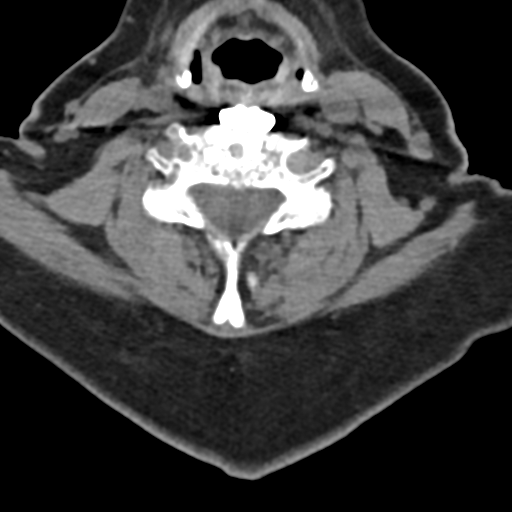
[im 52/87  soft-tissue]
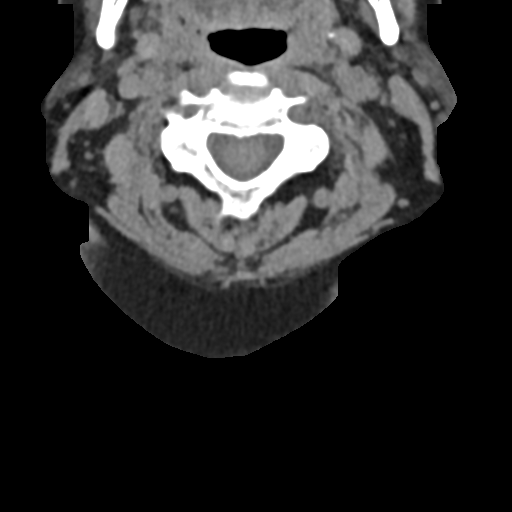
[im 69/87  soft-tissue]
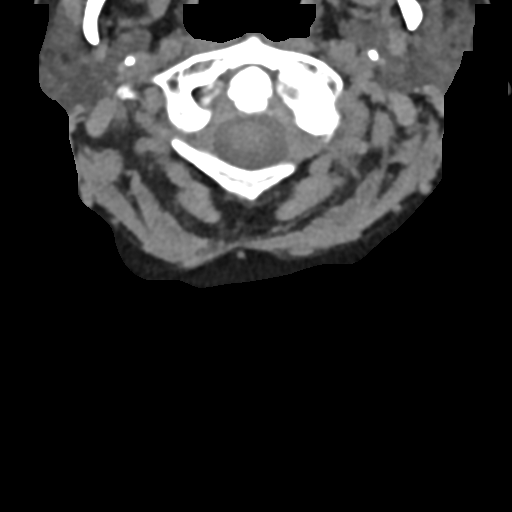

[Series 6: orthogonal bone · axial · 0.23mm/px · z∈[-257,-119]mm · 6 of 107 slices shown, 8 images]
[im 16/107  soft-tissue]
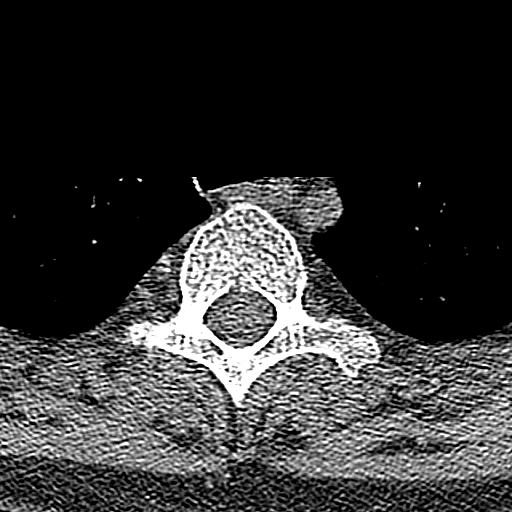
[im 16/107  bone]
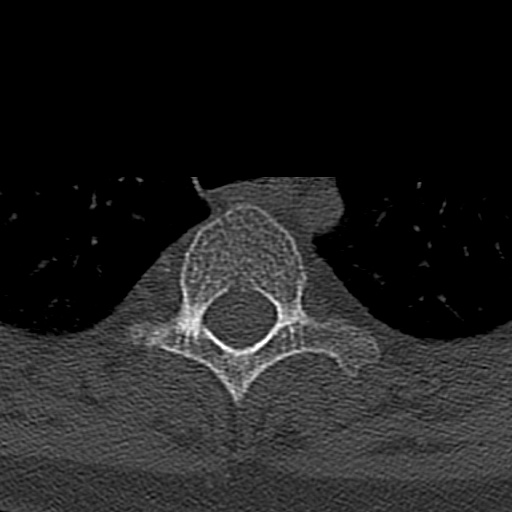
[im 31/107  bone]
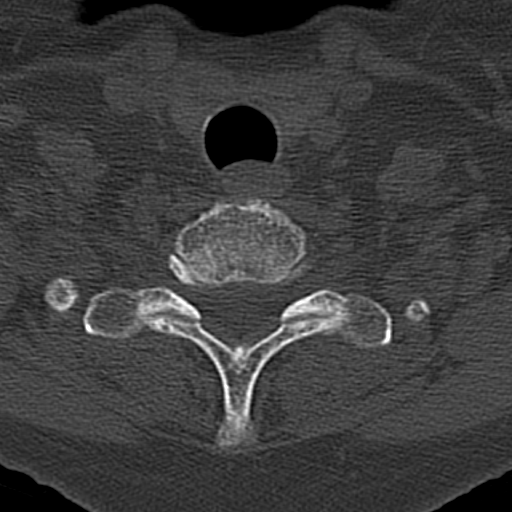
[im 46/107  bone]
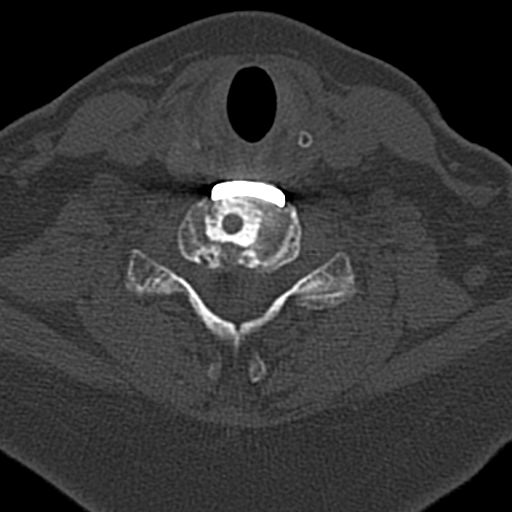
[im 61/107  bone]
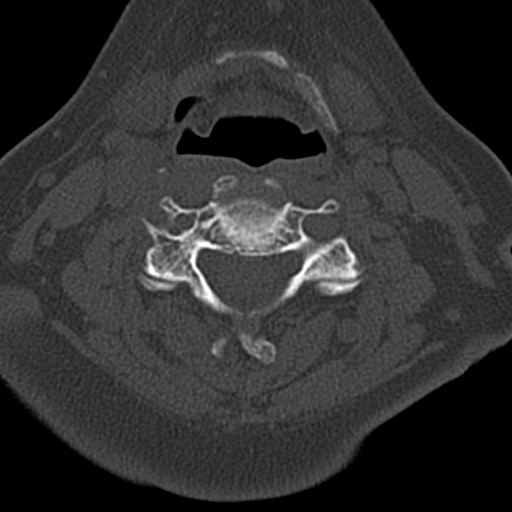
[im 76/107  soft-tissue]
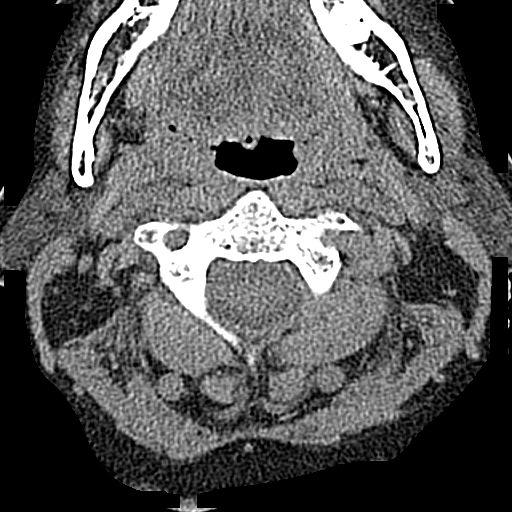
[im 76/107  bone]
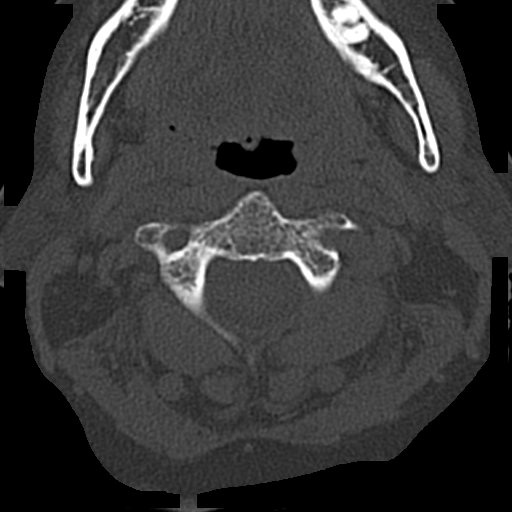
[im 91/107  bone]
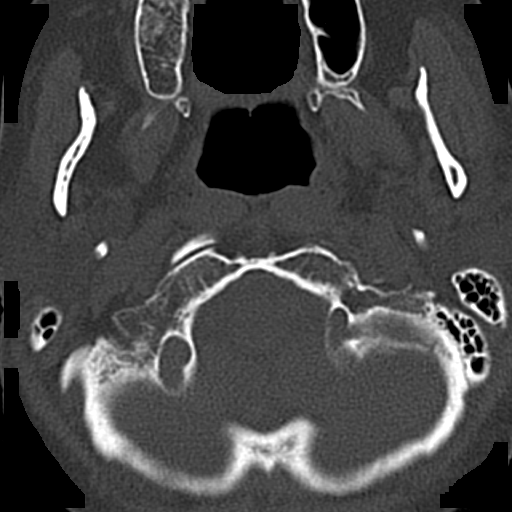

[10 of 14 positions shown; findings below may reference images not displayed]

FINDINGS: Alignment: Normal

Skull base and vertebrae: No acute fracture. No primary bone lesion
or focal pathologic process.

Soft tissues and spinal canal: No prevertebral fluid or swelling. No
visible canal hematoma.

Disc levels: Prior anterior fusion from C4-C6. Solid bony fusion
also noted across the C6-7 disc space. Degenerative disc disease
with disc space narrowing and spurring at C7-T1. Mild bilateral
degenerative facet disease.

Upper chest: No acute findings

Other: Small nodules in the right thyroid lobe measuring up to 6 mm.
Not clinically significant; no follow-up imaging recommended (ref: [HOSPITAL]. [DATE]): 143-50).
IMPRESSION: Degenerative disc and facet disease. Prior ACDF C4-C6. No acute bony
abnormality.

## 2020-07-23 IMAGING — CT CT HEAD W/O CM
1 series · 16 of 29 positions shown, 20 images · non-contrast
Comparison: None.

CLINICAL DATA: Fall, hit left side of head 2-3 weeks ago

EXAM:
CT HEAD WITHOUT CONTRAST
TECHNIQUE: Contiguous axial images were obtained from the base of the skull
through the vertex without intravenous contrast.

[Series 2: head wo · axial · 0.44mm/px · z∈[-102,+28]mm · 16 of 29 slices shown, 20 images]
[im 2/29  brain]
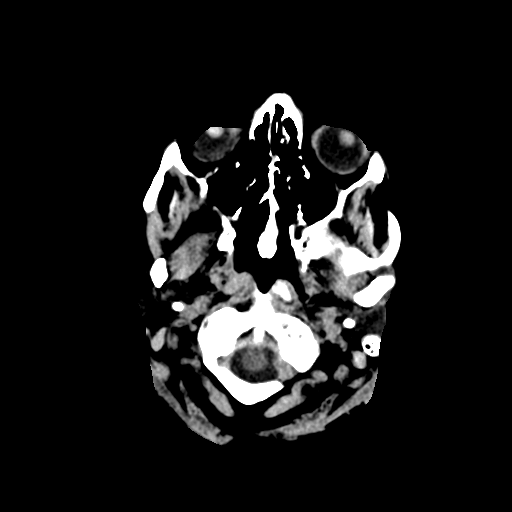
[im 2/29  bone]
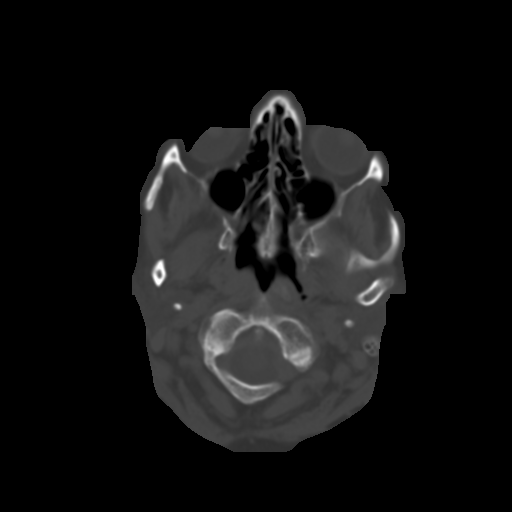
[im 4/29  brain]
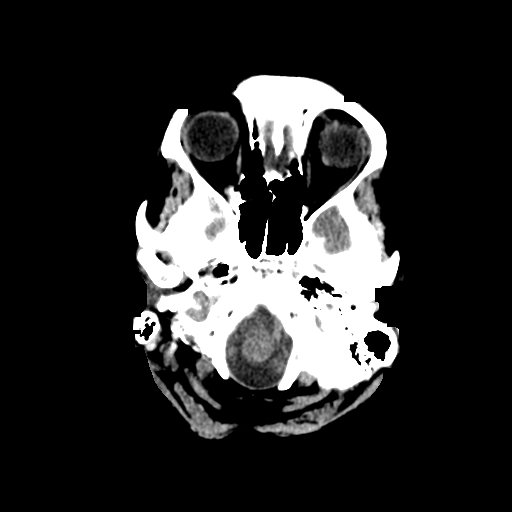
[im 6/29  brain]
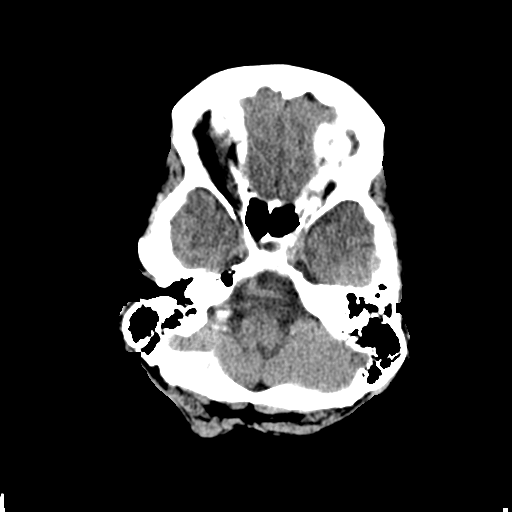
[im 7/29  brain]
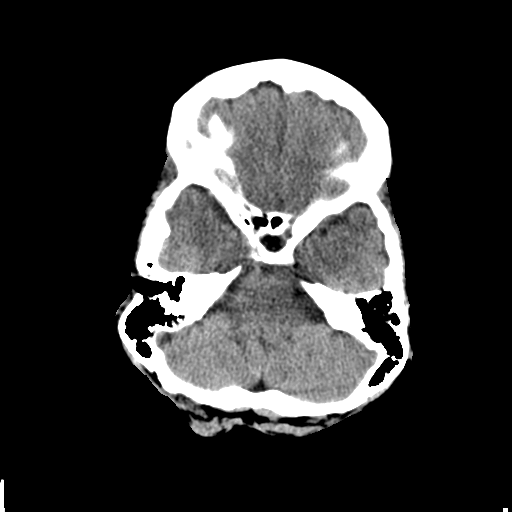
[im 9/29  brain]
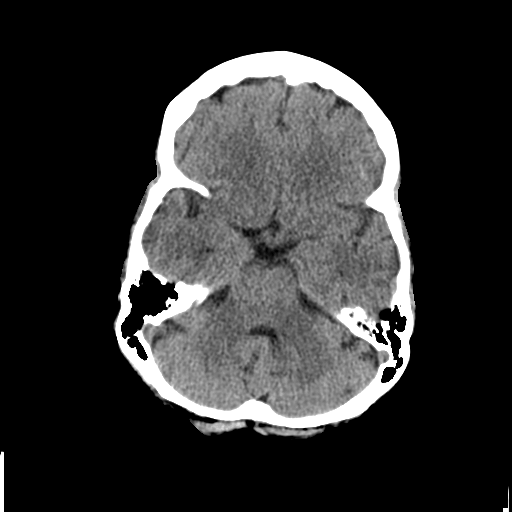
[im 9/29  bone]
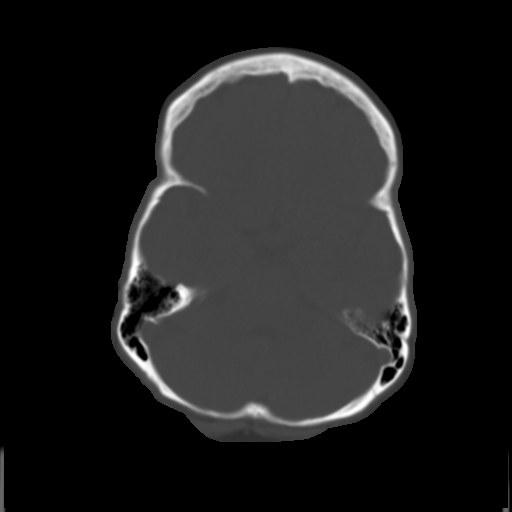
[im 11/29  brain]
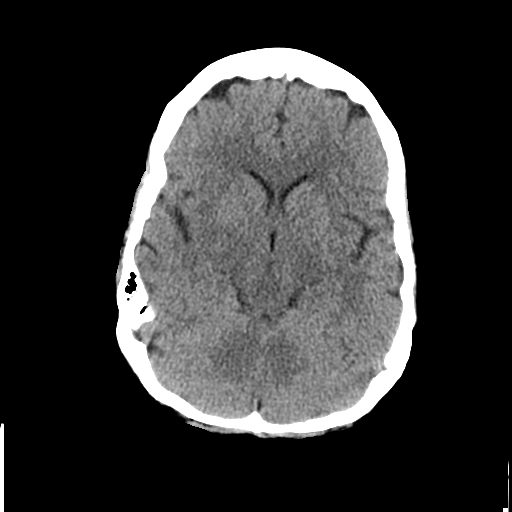
[im 12/29  brain]
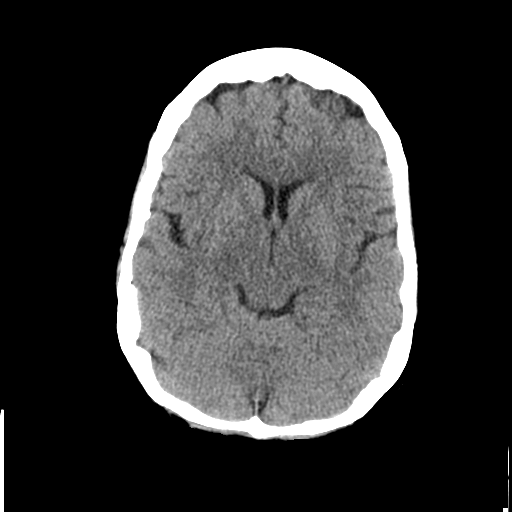
[im 14/29  brain]
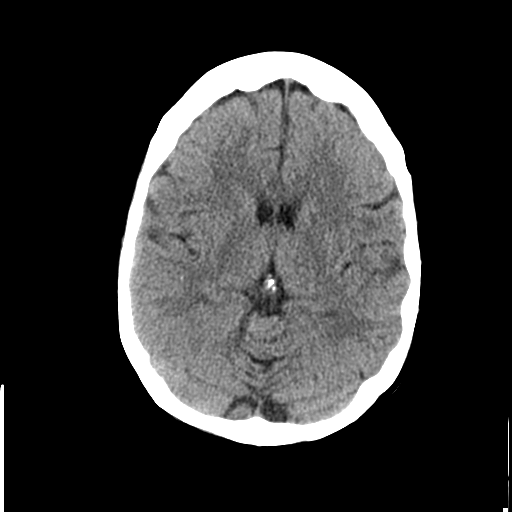
[im 16/29  brain]
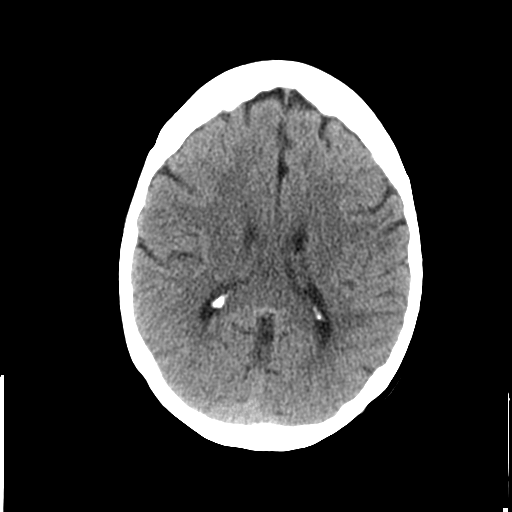
[im 16/29  bone]
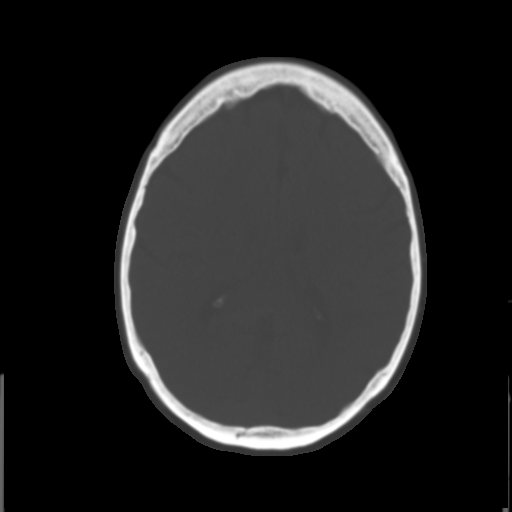
[im 18/29  brain]
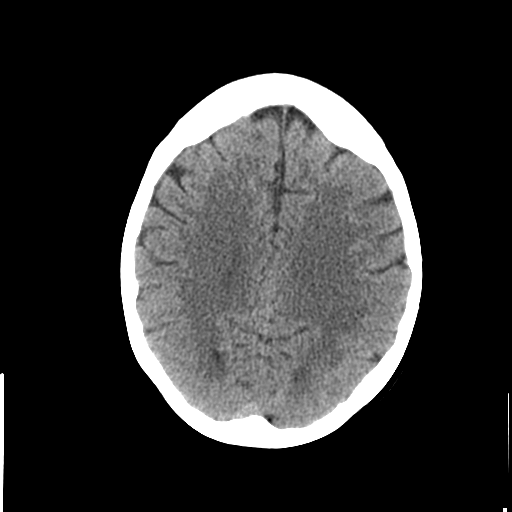
[im 19/29  brain]
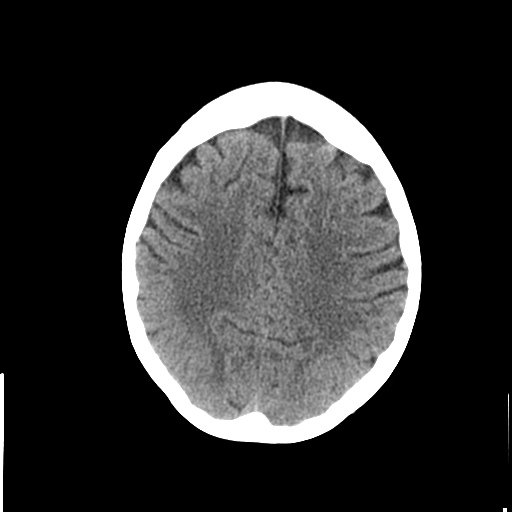
[im 21/29  brain]
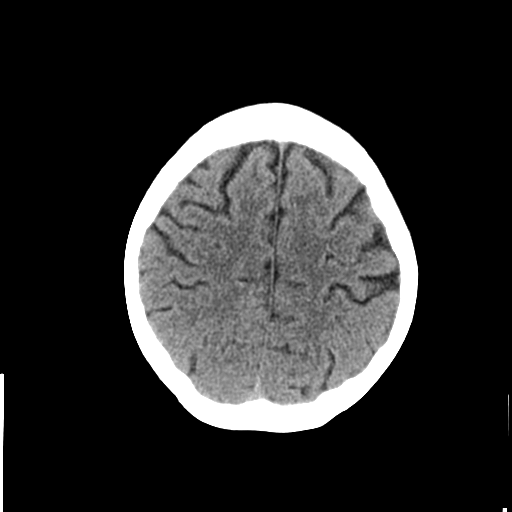
[im 23/29  brain]
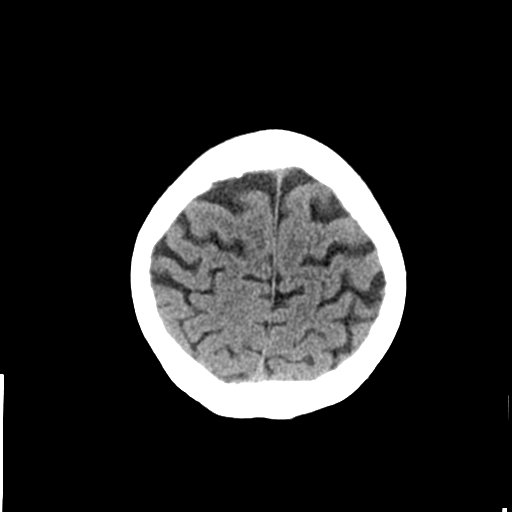
[im 23/29  bone]
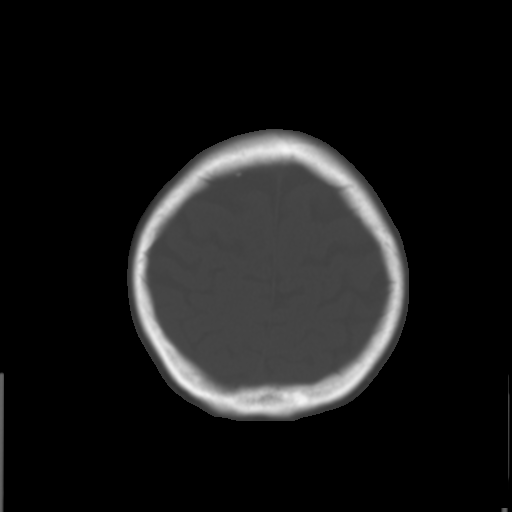
[im 24/29  brain]
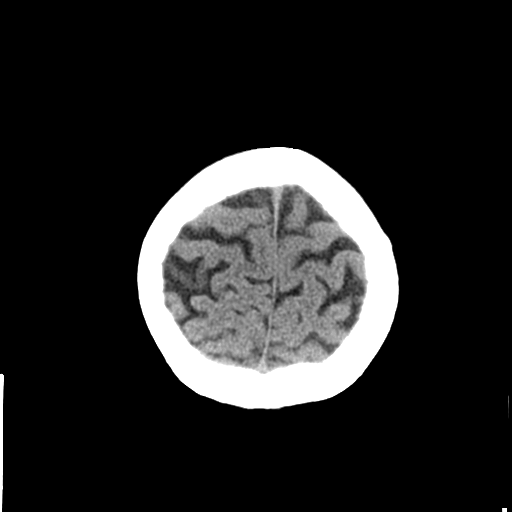
[im 26/29  brain]
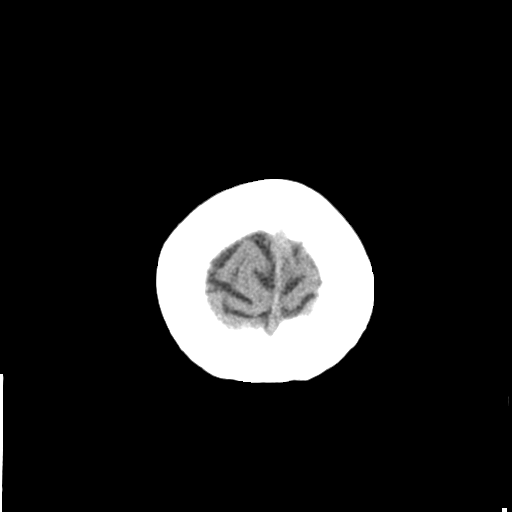
[im 28/29  brain]
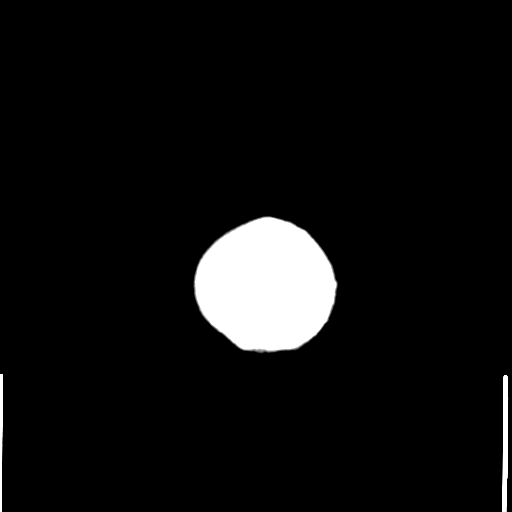

[16 of 29 positions shown; findings below may reference images not displayed]

FINDINGS: Brain: No acute intracranial abnormality. Specifically, no
hemorrhage, hydrocephalus, mass lesion, acute infarction, or
significant intracranial injury.

Vascular: No hyperdense vessel or unexpected calcification.

Skull: No acute calvarial abnormality.

Sinuses/Orbits: Visualized paranasal sinuses and mastoids clear.
Orbital soft tissues unremarkable.

Other: None
IMPRESSION: Normal study.

## 2020-10-10 ENCOUNTER — Emergency Department
Admission: EM | Admit: 2020-10-10 | Discharge: 2020-10-10 | Disposition: A | Discharge status: Home / Self Care | Payer: Medicare PPO | Attending: Emergency Medicine | Admitting: Emergency Medicine

## 2020-10-10 ENCOUNTER — Other Ambulatory Visit: Payer: Self-pay

## 2020-10-10 ENCOUNTER — Emergency Department: Payer: Medicare PPO

## 2020-10-10 DIAGNOSIS — Z96652 Presence of left artificial knee joint: Secondary | ICD-10-CM | POA: Diagnosis not present

## 2020-10-10 DIAGNOSIS — Z79899 Other long term (current) drug therapy: Secondary | ICD-10-CM | POA: Diagnosis not present

## 2020-10-10 DIAGNOSIS — N189 Chronic kidney disease, unspecified: Secondary | ICD-10-CM | POA: Insufficient documentation

## 2020-10-10 DIAGNOSIS — R531 Weakness: Secondary | ICD-10-CM | POA: Insufficient documentation

## 2020-10-10 DIAGNOSIS — I129 Hypertensive chronic kidney disease with stage 1 through stage 4 chronic kidney disease, or unspecified chronic kidney disease: Secondary | ICD-10-CM | POA: Diagnosis not present

## 2020-10-10 DIAGNOSIS — R001 Bradycardia, unspecified: Secondary | ICD-10-CM | POA: Insufficient documentation

## 2020-10-10 DIAGNOSIS — Z20822 Contact with and (suspected) exposure to covid-19: Secondary | ICD-10-CM | POA: Diagnosis not present

## 2020-10-10 DIAGNOSIS — R5383 Other fatigue: Secondary | ICD-10-CM | POA: Diagnosis not present

## 2020-10-10 DIAGNOSIS — R0789 Other chest pain: Secondary | ICD-10-CM | POA: Insufficient documentation

## 2020-10-10 LAB — RESP PANEL BY RT-PCR (FLU A&B, COVID) ARPGX2
Influenza A by PCR: NEGATIVE
Influenza B by PCR: NEGATIVE
SARS Coronavirus 2 by RT PCR: NEGATIVE

## 2020-10-10 LAB — BASIC METABOLIC PANEL
Anion gap: 9 (ref 5–15)
BUN: 25 mg/dL — ABNORMAL HIGH (ref 8–23)
CO2: 21 mmol/L — ABNORMAL LOW (ref 22–32)
Calcium: 9.2 mg/dL (ref 8.9–10.3)
Chloride: 108 mmol/L (ref 98–111)
Creatinine, Ser: 1 mg/dL (ref 0.44–1.00)
GFR, Estimated: >60 mL/min (ref >60)
Glucose, Bld: 119 mg/dL — ABNORMAL HIGH (ref 70–99)
Potassium: 4.3 mmol/L (ref 3.5–5.1)
Sodium: 138 mmol/L (ref 135–145)

## 2020-10-10 LAB — URINALYSIS, COMPLETE (UACMP) WITH MICROSCOPIC
Bacteria, UA: NONE SEEN
Bilirubin Urine: NEGATIVE
Glucose, UA: NEGATIVE mg/dL
Ketones, ur: NEGATIVE mg/dL
Leukocytes,Ua: NEGATIVE
Nitrite: NEGATIVE
Protein, ur: NEGATIVE mg/dL
Specific Gravity, Urine: 1.014 (ref 1.005–1.030)
WBC, UA: NONE SEEN WBC/hpf (ref 0–5)
pH: 5 (ref 5.0–8.0)

## 2020-10-10 LAB — CBC
HCT: 34.9 % — ABNORMAL LOW (ref 36.0–46.0)
Hemoglobin: 11.2 g/dL — ABNORMAL LOW (ref 12.0–15.0)
MCH: 28.1 pg (ref 26.0–34.0)
MCHC: 32.1 g/dL (ref 30.0–36.0)
MCV: 87.7 fL (ref 80.0–100.0)
Platelets: 255 10*3/uL (ref 150–400)
RBC: 3.98 MIL/uL (ref 3.87–5.11)
RDW: 17.3 % — ABNORMAL HIGH (ref 11.5–15.5)
WBC: 10 10*3/uL (ref 4.0–10.5)
nRBC: 0.2 % (ref 0.0–0.2)

## 2020-10-10 LAB — TROPONIN I (HIGH SENSITIVITY): Troponin I (High Sensitivity): 4 ng/L (ref <18)

## 2020-10-10 IMAGING — CR DG CHEST 2V
2 series · 2 of 2 positions shown · non-contrast
Comparison: [DATE]

CLINICAL DATA: Chest pain.  Chest tightness and dizziness

EXAM:
CHEST - 2 VIEW

[chest pa]
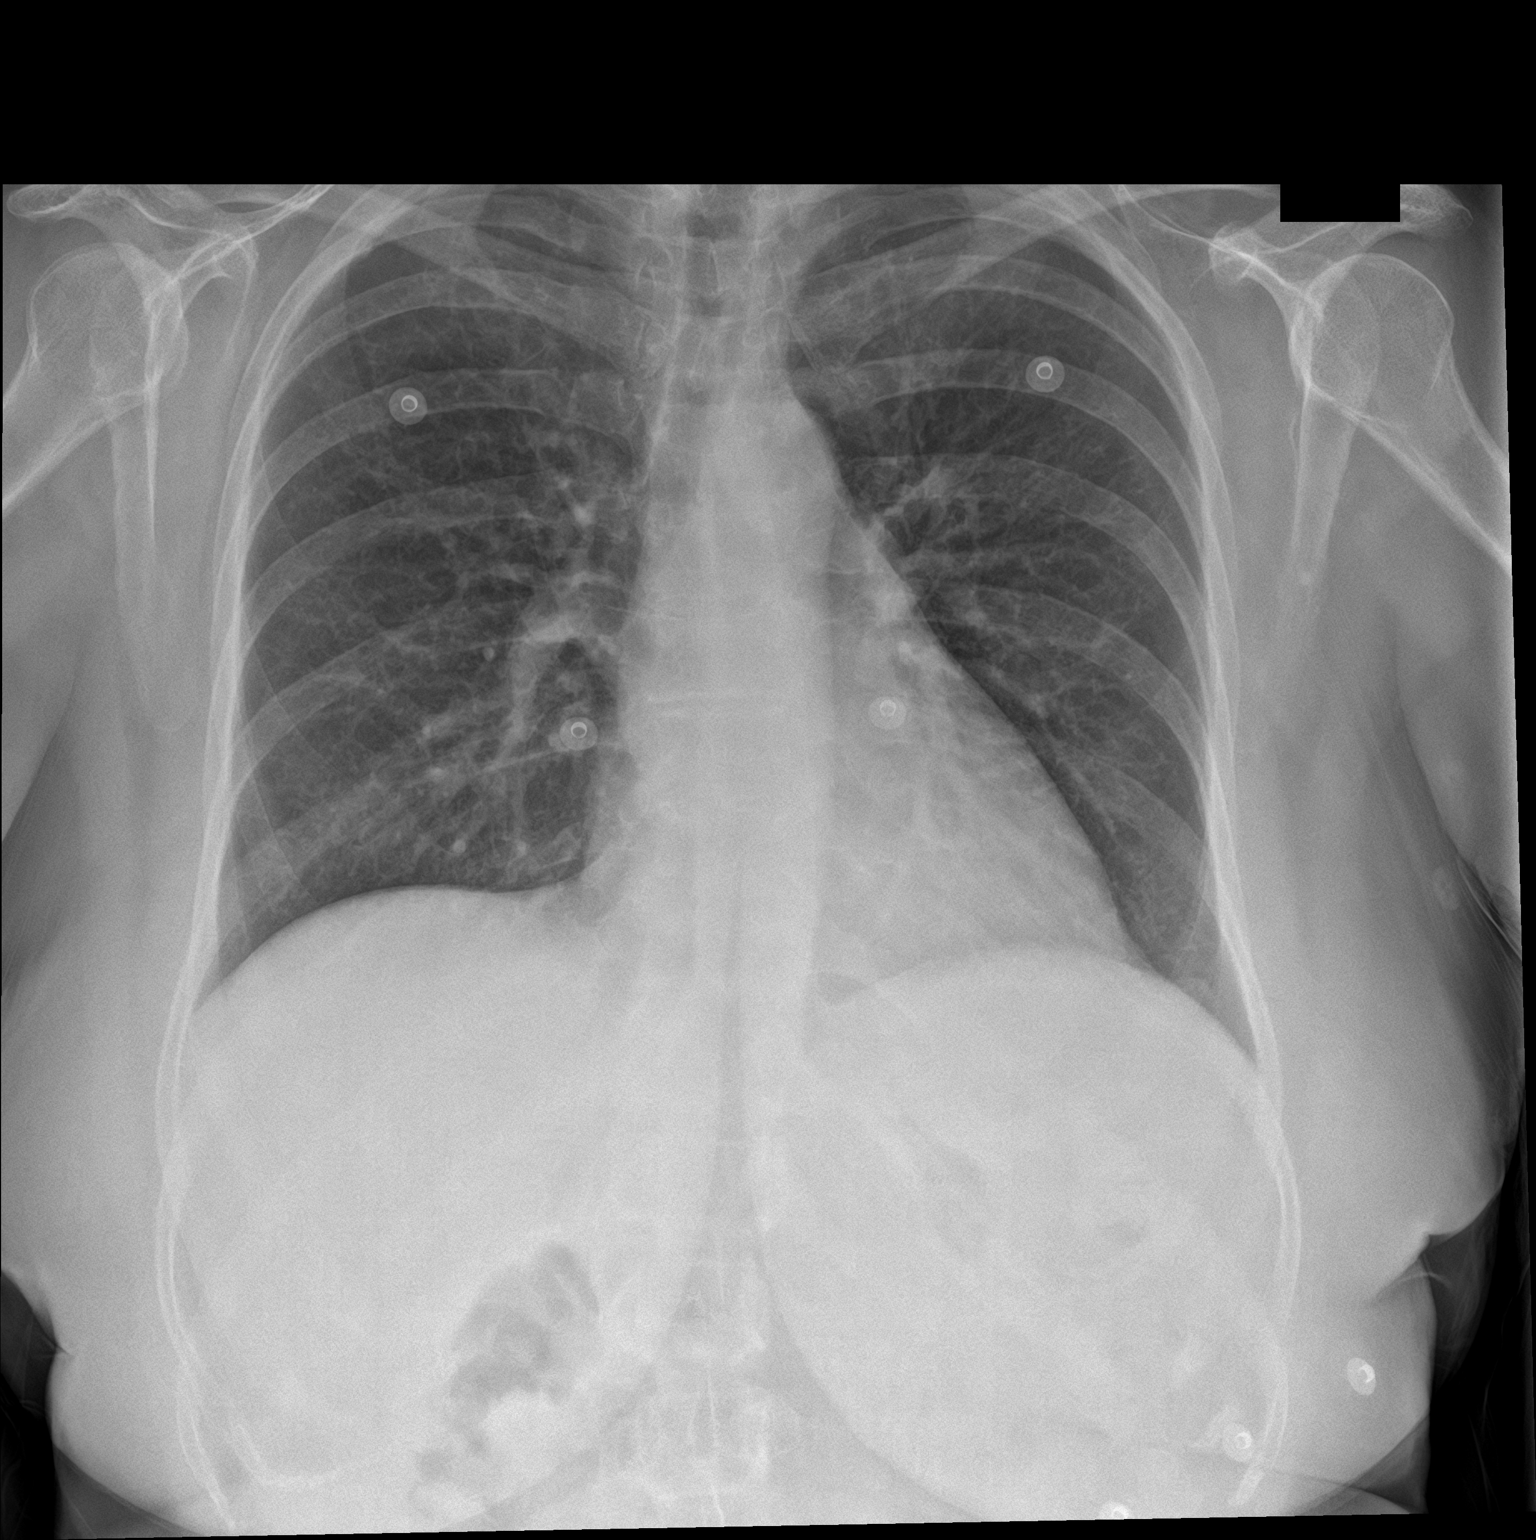

[chest lat]
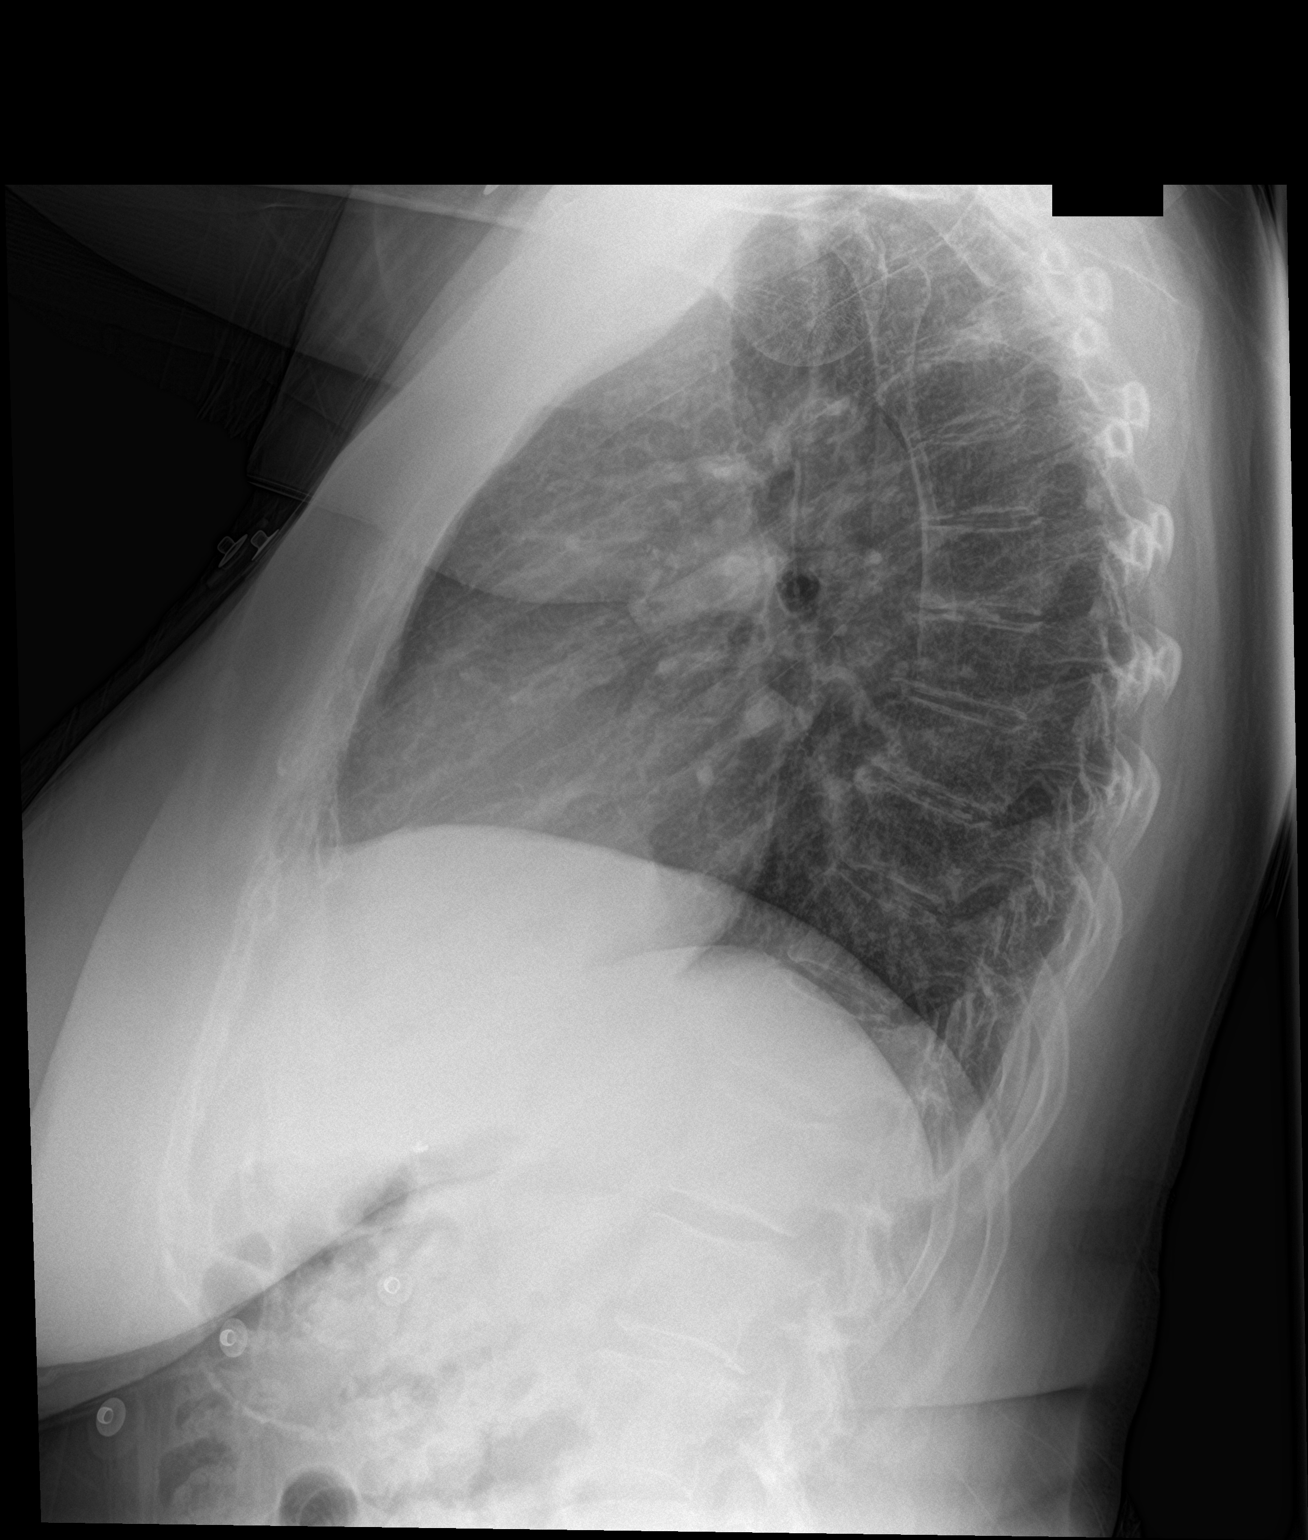

[2 of 2 positions shown; findings below may reference images not displayed]

FINDINGS: The heart size and mediastinal contours are within normal limits.
Both lungs are clear. The visualized skeletal structures are
unremarkable. ACDF lower cervical spine.
IMPRESSION: No active cardiopulmonary disease.

## 2020-10-10 NOTE — ED Provider Notes (Signed)
[facility] Emergency Department Provider Note  Time seen: 11:25 AM  I have reviewed the triage vital signs and the nursing notes.   HISTORY  Chief Complaint Dizziness and Chest Pain  HPI Amy Huynh is a 65 y.o. female with a past medical history of arthritis, fibromyalgia, neuropathy, Sjogren's, hypokalemia, CKD, presents to the emergency department for dizziness and weakness.  According to the patient over the past week or so she has been feeling more weak and dizzy at times.  Patient states a history of vertigo.  States last night she was feeling dizzy and she checked her blood pressure which was a little low but more concerningly her heart rate was running low around 40 bpm per patient.  Patient states it normally runs around 60 bpm.  Patient states she fell like she was having some mild chest tightness last night as well so she came to the emergency department today for evaluation.  Patient denies any chest pain currently.  Pulse rate is currently around 45 to 50 bpm.  Denies any current dizziness but does state generalized fatigue.  No recent fever or cough.  No chest pain or abdominal pain currently.   Past Medical History:  Diagnosis Date  . Arthritis   . Family history of ovarian cancer 12/2016   MyRisk neg  . Fibromyalgia   . Migraine   . Neuropathy   . Placenta previa   . Sjogren's disease [facility](HCC)     Patient Active Problem List   Diagnosis Date Noted  . Acute respiratory failure with hypoxia (HCC) 09/16/2019  . Multilobar lung infiltrate 09/16/2019  . Hypomagnesemia 09/16/2019  . Muscle weakness (generalized) 09/16/2019  . Community acquired pneumonia 09/12/2019  . Hypokalemia 09/12/2019  . Family history of ovarian cancer 01/11/2017  . Acute bursitis of right shoulder 06/07/2016  . Partial tear of right rotator cuff 06/07/2016  . Lumbar radiculopathy 07/23/2015  . Migraine without aura and without status migrainosus, not intractable  04/23/2015  . Chronic daily headache 04/23/2015  . DDD (degenerative disc disease), cervical 07/06/2014  . Neck pain 07/06/2014  . Dyspareunia 12/14/2013  . Benign essential hypertension 09/13/2013  . Polyarthralgia 07/20/2013  . Diverticula of colon 01/22/2013  . Fibromyalgia 01/22/2013  . Restless legs syndrome 01/22/2013  . Moderate obstructive sleep apnea 01/22/2013  . Migraine headache 01/22/2013  . Insomnia 01/22/2013  . Generalized anxiety disorder 01/22/2013  . Esophageal reflux 01/22/2013  . Atrophic vaginitis 01/22/2013  . Anemia 01/22/2013  . Allergic rhinitis 01/22/2013  . Urticaria 01/18/2013  . Low back pain 10/03/2012  . Lumbosacral spondylosis 08/17/2012    Past Surgical History:  Procedure Laterality Date  . CERVICAL FUSION    . CESAREAN SECTION    . CHOLECYSTECTOMY    . HERNIA REPAIR    . REPLACEMENT TOTAL KNEE Left   . SHOULDER SURGERY Right     Prior to Admission medications   Medication Sig Start Date End Date Taking? Authorizing Provider  aspirin-acetaminophen-caffeine (EXCEDRIN MIGRAINE) [phone] MG tablet Take by mouth.    [provider]  butalbital-acetaminophen-caffeine (FIORICET, ESGIC) 50-325-40 MG tablet Take 1 tablet by mouth every 6 (six) hours as needed for headache.    [provider]  cetirizine (ZYRTEC) 10 MG tablet Take 10 mg by mouth daily.    [provider]  clobetasol cream (TEMOVATE) 0.05 % Apply topically. 09/07/17   [provider]  cyanocobalamin 1000 MCG tablet Take 1,000 mcg by mouth daily.    [provider]  esomeprazole (NEXIUM) 40 MG capsule Take 40 mg by mouth daily. bEFORE BREAKFAST    [provider]  fexofenadine (ALLEGRA) 180 MG tablet Take 180 mg by mouth daily.    [provider]  folic acid (FOLVITE) 1 MG tablet Take 1 mg by mouth daily. 08/16/19   [provider]  gabapentin (NEURONTIN) 300 MG capsule Take 900 mg by mouth at bedtime.     [provider]  hydrOXYzine (ATARAX/VISTARIL) 10 MG tablet Take 10 mg by mouth every 8 (eight) hours as needed.    [provider]  hyoscyamine (LEVSIN, ANASPAZ) 0.125 MG tablet Take 0.125 mg by mouth every 4 (four) hours as needed.    [provider]  methocarbamol (ROBAXIN) 500 MG tablet Take 500 mg by mouth at bedtime.  08/22/17   [provider]  methotrexate (RHEUMATREX) 2.5 MG tablet Take 25 mg by mouth once a week. Take 5 tablets by mouth in the morning and 5 tablets by mouth in the evening on Saturdays 02/08/18   [provider]  pilocarpine (SALAGEN) 5 MG tablet Take 5 mg by mouth 3 (three) times daily.    [provider]  promethazine (PHENERGAN) 25 MG tablet Take 25 mg by mouth every 6 (six) hours as needed for nausea or vomiting.    [provider]  rizatriptan (MAXALT) 10 MG tablet Take 10 mg by mouth as needed for migraine. May repeat in 2 hours if needed    [provider]  rOPINIRole (REQUIP) 0.5 MG tablet Take 1 tablet by mouth at bedtime.  07/26/19 07/25/20  [provider]  saccharomyces boulardii (FLORASTOR) 250 MG capsule Take 1 capsule (250 mg total) by mouth 2 (two) times daily. 09/16/19   Dhungel, Theda Belfast, MD  topiramate (TOPAMAX) 100 MG tablet Take 200 mg by mouth daily.    [provider]  valACYclovir (VALTREX) 1000 MG tablet Take 1 tablet once a day by mouth 07/14/16   [provider]  VITAMIN D, CHOLECALCIFEROL, PO Take by mouth.    [provider]    Allergies  Allergen Reactions  . Hydrocodone-Acetaminophen     Other reaction(s): Other (See Comments) Severe headache  . Hydrocodone-Acetaminophen     Other reaction(s): Headache, Other (See Comments) Severe headache Severe headache  Other reaction(s): Headache Severe headache Other reaction(s): Headache, Other (See Comments) Severe headache Severe headache  . Morphine Itching and Other (See Comments)    Other  reaction(s): Other (See Comments) Morphine in high doses causes itching & red face. Morphine in high doses causes itching & red face.  Morphine in high doses causes itching & red face. Other reaction(s): Other (See Comments) Morphine in high doses causes itching & red face. Morphine in high doses causes itching & red face. Morphine in high doses causes itching & red face.  . Morphine And Related Itching    Morphine in high doses causes itching & red face.   Marland Kitchen Cymbalta [Duloxetine Hcl] Other (See Comments)    Serotonin Reaction  . Duloxetine Other (See Comments)    Other reaction(s): Other (See Comments), Other (See Comments) Serotonin syndrome Serotonin reaction  Serotonin syndrome Other reaction(s): Other (See Comments), Other (See Comments) Serotonin syndrome Serotonin reaction  . Pregabalin Other (See Comments)    Other reaction(s): Other (See Comments) Serotinon reaction serotinon reaction  Serotinon reaction Other reaction(s): Other (See Comments) Serotinon reaction serotinon reaction    Family History  Problem Relation Age of Onset  . Ovarian cancer  Maternal Aunt 30  . Uterine cancer Mother        44s  . COPD Mother   . Hypertension Mother   . Uterine cancer Maternal Grandmother        ? age  . Heart attack Father 24  . Other Father        DDD    Social History Social History   Tobacco Use  . Smoking status: Never Smoker  . Smokeless tobacco: Never Used  Vaping Use  . Vaping Use: Never used  Substance Use Topics  . Alcohol use: Yes    Comment: social  . Drug use: Never    Review of Systems Constitutional: Negative for fever.  Positive generalized fatigue. Cardiovascular: Mild chest tightness last night, now resolved Respiratory: Negative for shortness of breath. Gastrointestinal: Negative for abdominal pain, vomiting and diarrhea. Genitourinary: Negative for urinary compaints Musculoskeletal: Negative for musculoskeletal  complaints Neurological: Negative for headache.  Intermittent dizziness. All other ROS negative  ____________________________________________   PHYSICAL EXAM:  VITAL SIGNS: ED Triage Vitals  Enc Vitals Group     BP 10/10/20 1007 127/73     Pulse Rate 10/10/20 1007 (!) 47     Resp 10/10/20 1007 16     Temp 10/10/20 1036 99 F (37.2 C)     Temp Source 10/10/20 1007 Oral     SpO2 10/10/20 1007 100 %     Weight 10/10/20 1008 180 lb (81.6 kg)     Height 10/10/20 1008 5\' 2"  (1.575 m)     Head Circumference --      Peak Flow --      Pain Score 10/10/20 1007 5     Pain Loc --      Pain Edu? --      Excl. in GC? --     Constitutional: Alert and oriented. Well appearing and in no distress. Eyes: Normal exam ENT      Head: Normocephalic and atraumatic.      Mouth/Throat: Mucous membranes are moist. Cardiovascular: Regular rhythm rate around 50 bpm. Respiratory: Normal respiratory effort without tachypnea nor retractions. Breath sounds are clear Gastrointestinal: Soft and nontender. No distention.   Musculoskeletal: Nontender with normal range of motion in all extremities. Neurologic:  Normal speech and language. No gross focal neurologic deficits  Skin:  Skin is warm, dry and intact.  Psychiatric: Mood and affect are normal.   ____________________________________________    EKG  EKG viewed and interpreted by myself shows sinus bradycardia at 49 bpm with a narrow QRS, normal axis, normal intervals, no concerning ST changes.  ____________________________________________    RADIOLOGY  Chest x-ray negative  ____________________________________________   INITIAL IMPRESSION / ASSESSMENT AND PLAN / ED COURSE  Pertinent labs & imaging results that were available during my care of the patient were reviewed by me and considered in my medical decision making (see chart for details).   Patient presents to the emergency department for intermittent dizziness mild chest tightness  and lower heart rate.  Overall the patient appears well, no distress.  Vital signs are reassuring.  Patient does have a lower heart rate around 45 to 55 bpm but blood pressure has remained normal.  Patient is afebrile.  100% room air saturation.  Patient's lab work is reassuring including a negative troponin.  Patient was worried about her potassium level which is also normal today.  H&H is reassuring.  We will check a COVID swab and a urinalysis as a precaution.  Patient agreeable to  plan of care.  Given the patient's bradycardia I do believe she would benefit from cardiology follow-up as an outpatient for Holter monitoring.  However given the otherwise reassuring work-up and normal blood pressures do not believe emergent follow-up is required.  COVID test is negative.  Urinalysis is normal.  Discussed with the patient increasing oral hydration follow-up with cardiology for Holter monitor testing.  Patient agreeable to plan of care.  Amy Huynh was evaluated in Emergency Department on 10/10/2020 for the symptoms described in the history of present illness. She was evaluated in the context of the global COVID-19 pandemic, which necessitated consideration that the patient might be at risk for infection with the SARS-CoV-2 virus that causes COVID-19. Institutional protocols and algorithms that pertain to the evaluation of patients at risk for COVID-19 are in a state of rapid change based on information released by regulatory bodies including the CDC and federal and state organizations. These policies and algorithms were followed during the patient's care in the ED.  ____________________________________________   FINAL CLINICAL IMPRESSION(S) / ED DIAGNOSES  Fatigue Bradycardia   Minna Antis, MD 10/10/20 1240

## 2020-10-10 NOTE — ED Triage Notes (Signed)
Pt c/o generalized fatigue with intermittent chest tightness and dizziness for the past couple of days. Pt is in NAD on arrival

## 2020-10-10 NOTE — Discharge Instructions (Addendum)
Please call the number provided for cardiology to arrange a follow-up appointment for consideration of Holter monitor testing.  Return to the emergency department for any significant increase in weakness, return of chest pain, or any other symptom personally concerning to yourself

## 2020-10-16 DIAGNOSIS — R001 Bradycardia, unspecified: Secondary | ICD-10-CM | POA: Insufficient documentation

## 2020-10-20 MED ORDER — PROPOFOL 10 MG/ML IV BOLUS
INTRAVENOUS | Status: AC
  Filled 2020-10-20: qty 20

## 2020-10-20 MED ORDER — FENTANYL CITRATE (PF) 100 MCG/2ML IJ SOLN
INTRAMUSCULAR | Status: AC
  Filled 2020-10-20: qty 2

## 2020-10-21 ENCOUNTER — Other Ambulatory Visit: Payer: Self-pay | Admitting: Pulmonary Disease

## 2020-10-21 DIAGNOSIS — R0600 Dyspnea, unspecified: Secondary | ICD-10-CM

## 2020-10-21 DIAGNOSIS — R0609 Other forms of dyspnea: Secondary | ICD-10-CM

## 2020-10-21 DIAGNOSIS — J849 Interstitial pulmonary disease, unspecified: Secondary | ICD-10-CM

## 2020-10-28 ENCOUNTER — Ambulatory Visit: Payer: Medicare PPO

## 2020-11-18 ENCOUNTER — Ambulatory Visit
Admission: RE | Admit: 2020-11-18 | Discharge: 2020-11-18 | Disposition: A | Discharge status: Home / Self Care | Payer: Medicare PPO | Source: Ambulatory Visit | Attending: Family Medicine | Admitting: Family Medicine

## 2020-11-18 ENCOUNTER — Other Ambulatory Visit: Payer: Self-pay

## 2020-11-18 VITALS — BP 132/89 | HR 69 | Temp 98.2°F | Resp 18 | Ht 62.5 in | Wt 180.0 lb

## 2020-11-18 DIAGNOSIS — N182 Chronic kidney disease, stage 2 (mild): Secondary | ICD-10-CM | POA: Insufficient documentation

## 2020-11-18 DIAGNOSIS — M797 Fibromyalgia: Secondary | ICD-10-CM | POA: Insufficient documentation

## 2020-11-18 DIAGNOSIS — J069 Acute upper respiratory infection, unspecified: Secondary | ICD-10-CM | POA: Diagnosis not present

## 2020-11-18 DIAGNOSIS — R059 Cough, unspecified: Secondary | ICD-10-CM

## 2020-11-18 DIAGNOSIS — K5900 Constipation, unspecified: Secondary | ICD-10-CM | POA: Insufficient documentation

## 2020-11-18 DIAGNOSIS — R0981 Nasal congestion: Secondary | ICD-10-CM

## 2020-11-18 DIAGNOSIS — M3500 Sicca syndrome, unspecified: Secondary | ICD-10-CM | POA: Insufficient documentation

## 2020-11-18 DIAGNOSIS — Z79899 Other long term (current) drug therapy: Secondary | ICD-10-CM | POA: Insufficient documentation

## 2020-11-18 DIAGNOSIS — R5383 Other fatigue: Secondary | ICD-10-CM | POA: Insufficient documentation

## 2020-11-18 DIAGNOSIS — I129 Hypertensive chronic kidney disease with stage 1 through stage 4 chronic kidney disease, or unspecified chronic kidney disease: Secondary | ICD-10-CM | POA: Insufficient documentation

## 2020-11-18 DIAGNOSIS — J029 Acute pharyngitis, unspecified: Secondary | ICD-10-CM

## 2020-11-18 DIAGNOSIS — Z7982 Long term (current) use of aspirin: Secondary | ICD-10-CM | POA: Diagnosis not present

## 2020-11-18 DIAGNOSIS — Z20822 Contact with and (suspected) exposure to covid-19: Secondary | ICD-10-CM | POA: Insufficient documentation

## 2020-11-18 DIAGNOSIS — Z885 Allergy status to narcotic agent status: Secondary | ICD-10-CM | POA: Insufficient documentation

## 2020-11-18 LAB — RESP PANEL BY RT-PCR (FLU A&B, COVID) ARPGX2
Influenza A by PCR: NEGATIVE
Influenza B by PCR: NEGATIVE
SARS Coronavirus 2 by RT PCR: NEGATIVE

## 2020-11-18 LAB — GROUP A STREP BY PCR: Group A Strep by PCR: NOT DETECTED

## 2020-11-18 MED ORDER — PROMETHAZINE-DM 6.25-15 MG/5ML PO SYRP: 5.0000 mL | ORAL_SOLUTION | Freq: Every evening | ORAL | 0 refills | Status: DC

## 2020-11-18 NOTE — Discharge Instructions (Addendum)
The strep, flu and COVID tests are all negative.  Take Mucinex during the day and the promethazine cough syrup at bedtime.  Increase rest and fluids.  It is fine to use the cough drops as well.  You can also consider using nasal saline and Flonase for your congestion.  Can also consider Chloraseptic throat spray.  Tylenol Motrin for any achiness or discomfort.  This is likely a viral illness and you should feel better within 7 to 10 days but if you feel you are still running a fever or the cough worsens or you have a breathing issue, you should be seen again.

## 2020-11-18 NOTE — ED Triage Notes (Signed)
Pt c/o waking up with sore throat Sunday, headache, some nasal congestion and elevated temp to 100.5. Pt does have slight cough but states it is high in her chest, not deep. Pt denies n/v/d, does report some constipation and abd pain from this.

## 2020-11-18 NOTE — ED Provider Notes (Signed)
[facility]    CSN: [id number] Arrival date & time: 11/18/20  1019      History   Chief Complaint Chief Complaint  Patient presents with   Nasal Congestion   Sore Throat   Headache    HPI Amy Huynh is a 65 y.o. female presenting with 3-day history of fatigue.  Patient states that over the past 2 days she has had a sore throat that is moderate to severe, headaches, nasal congestion and a mild cough.  Patient also has low-grade fevers up to 100.5 degrees.  She denies any shortness of breath or chest discomfort.  Denies any nausea/vomiting or diarrhea but has been a little constipated.  Patient states that her son presently has COVID-19 and she was in contact with him 7 to 10 days ago.  Patient has been vaccinated for COVID-19 x4.  She has not been taking any OTC meds for symptoms.  Patient does have history of Sjogren's disease, fibromyalgia, allergic rhinitis, obstructive sleep apnea, CKD stage 2, and hypertension.  Patient has no other complaints or concerns today.  HPI  Past Medical History:  Diagnosis Date   Arthritis    Family history of ovarian cancer 12/2016   MyRisk neg   Fibromyalgia    Migraine    Neuropathy    Placenta previa    Sjogren's disease [facility](HCC)     Patient Active Problem List   Diagnosis Date Noted   Acute respiratory failure with hypoxia (HCC) 09/16/2019   Multilobar lung infiltrate 09/16/2019   Hypomagnesemia 09/16/2019   Muscle weakness (generalized) 09/16/2019   Community acquired pneumonia 09/12/2019   Hypokalemia 09/12/2019   Family history of ovarian cancer 01/11/2017   Acute bursitis of right shoulder 06/07/2016   Partial tear of right rotator cuff 06/07/2016   Lumbar radiculopathy 07/23/2015   Migraine without aura and without status migrainosus, not intractable 04/23/2015   Chronic daily headache 04/23/2015   DDD (degenerative disc disease), cervical 07/06/2014   Neck pain 07/06/2014   Dyspareunia 12/14/2013    Benign essential hypertension 09/13/2013   Polyarthralgia 07/20/2013   Diverticula of colon 01/22/2013   Fibromyalgia 01/22/2013   Restless legs syndrome 01/22/2013   Moderate obstructive sleep apnea 01/22/2013   Migraine headache 01/22/2013   Insomnia 01/22/2013   Generalized anxiety disorder 01/22/2013   Esophageal reflux 01/22/2013   Atrophic vaginitis 01/22/2013   Anemia 01/22/2013   Allergic rhinitis 01/22/2013   Urticaria 01/18/2013   Low back pain 10/03/2012   Lumbosacral spondylosis 08/17/2012    Past Surgical History:  Procedure Laterality Date   CERVICAL FUSION     CESAREAN SECTION     CHOLECYSTECTOMY     HERNIA REPAIR     REPLACEMENT TOTAL KNEE Left    SHOULDER SURGERY Right     OB History     Gravida  4   Para  3   Term  3   Preterm      AB  1   Living  2      SAB  1   IAB      Ectopic      Multiple      Live Births  3            Home Medications    Prior to Admission medications   Medication Sig Start Date End Date Taking? Authorizing Provider  aspirin-acetaminophen-caffeine (EXCEDRIN MIGRAINE) [id number] MG tablet Take by mouth.   Yes [provider]  butalbital-acetaminophen-caffeine (FIORICET, ESGIC) 50-325-40 MG tablet  Take 1 tablet by mouth every 6 (six) hours as needed for headache.   Yes [provider]  cetirizine (ZYRTEC) 10 MG tablet Take 10 mg by mouth daily.   Yes [provider]  clobetasol cream (TEMOVATE) 0.05 % Apply topically. 09/07/17  Yes [provider]  cyanocobalamin 1000 MCG tablet Take 1,000 mcg by mouth daily.   Yes [provider]  esomeprazole (NEXIUM) 40 MG capsule Take 40 mg by mouth daily. bEFORE BREAKFAST   Yes [provider]  fexofenadine (ALLEGRA) 180 MG tablet Take 180 mg by mouth daily.   Yes [provider]  folic acid (FOLVITE) 1 MG tablet Take 1 mg by mouth daily. 08/16/19  Yes [provider]  gabapentin (NEURONTIN) 300 MG  capsule Take 900 mg by mouth at bedtime.   Yes [provider]  hydrOXYzine (ATARAX/VISTARIL) 10 MG tablet Take 10 mg by mouth every 8 (eight) hours as needed.   Yes [provider]  hyoscyamine (LEVSIN, ANASPAZ) 0.125 MG tablet Take 0.125 mg by mouth every 4 (four) hours as needed.   Yes [provider]  methocarbamol (ROBAXIN) 500 MG tablet Take 500 mg by mouth at bedtime.  08/22/17  Yes [provider]  methotrexate (RHEUMATREX) 2.5 MG tablet Take 25 mg by mouth once a week. Take 5 tablets by mouth in the morning and 5 tablets by mouth in the evening on Saturdays 02/08/18  Yes [provider]  pilocarpine (SALAGEN) 5 MG tablet Take 5 mg by mouth 3 (three) times daily.   Yes [provider]  promethazine (PHENERGAN) 25 MG tablet Take 25 mg by mouth every 6 (six) hours as needed for nausea or vomiting.   Yes [provider]  promethazine-dextromethorphan (PROMETHAZINE-DM) 6.25-15 MG/5ML syrup Take 5 mLs by mouth at bedtime. 11/18/20  Yes Eusebio Friendly B, PA-C  rizatriptan (MAXALT) 10 MG tablet Take 10 mg by mouth as needed for migraine. May repeat in 2 hours if needed   Yes [provider]  rOPINIRole (REQUIP) 0.5 MG tablet Take 1 tablet by mouth at bedtime.  07/26/19 11/18/20 Yes [provider]  saccharomyces boulardii (FLORASTOR) 250 MG capsule Take 1 capsule (250 mg total) by mouth 2 (two) times daily. 09/16/19  Yes Dhungel, Nishant, MD  topiramate (TOPAMAX) 100 MG tablet Take 200 mg by mouth daily.   Yes [provider]  valACYclovir (VALTREX) 1000 MG tablet Take 1 tablet once a day by mouth 07/14/16  Yes [provider]  VITAMIN D, CHOLECALCIFEROL, PO Take by mouth.   Yes [provider]    Family History Family History  Problem Relation Age of Onset   Ovarian cancer Maternal Aunt 30   Uterine cancer Mother        25s   COPD Mother    Hypertension Mother    Uterine cancer Maternal  Grandmother        ? age   Heart attack Father 23   Other Father        DDD    Social History Social History   Tobacco Use   Smoking status: Never   Smokeless tobacco: Never  Vaping Use   Vaping Use: Never used  Substance Use Topics   Alcohol use: Yes    Comment: social   Drug use: Never     Allergies   Hydrocodone-acetaminophen, Hydrocodone-acetaminophen, Morphine, Morphine and related, Cymbalta [duloxetine hcl], Duloxetine, and Pregabalin   Review of Systems Review of Systems  Constitutional:  Positive  for fatigue. Negative for chills, diaphoresis and fever.  HENT:  Positive for congestion, rhinorrhea and sore throat. Negative for ear pain, sinus pressure and sinus pain.   Respiratory:  Positive for cough. Negative for shortness of breath.   Cardiovascular:  Negative for chest pain.  Gastrointestinal:  Positive for constipation. Negative for abdominal pain, nausea and vomiting.  Musculoskeletal:  Negative for arthralgias and myalgias.  Skin:  Negative for rash.  Neurological:  Positive for headaches. Negative for weakness.  Hematological:  Negative for adenopathy.    Physical Exam Triage Vital Signs ED Triage Vitals  Enc Vitals Group     BP 11/18/20 1040 132/89     Pulse Rate 11/18/20 1040 69     Resp 11/18/20 1040 18     Temp 11/18/20 1040 98.2 F (36.8 C)     Temp Source 11/18/20 1040 Oral     SpO2 11/18/20 1040 100 %     Weight 11/18/20 1037 180 lb (81.6 kg)     Height 11/18/20 1037 5' 2.5" (1.588 m)     Head Circumference --      Peak Flow --      Pain Score 11/18/20 1036 7     Pain Loc --      Pain Edu? --      Excl. in GC? --    No data found.  Updated Vital Signs BP 132/89 (BP Location: Left Arm)   Pulse 69   Temp 98.2 F (36.8 C) (Oral)   Resp 18   Ht 5' 2.5" (1.588 m)   Wt 180 lb (81.6 kg)   SpO2 100%   BMI 32.40 kg/m     Physical Exam Vitals and nursing note reviewed.  Constitutional:      General: She is not in acute  distress.    Appearance: Normal appearance. She is not ill-appearing or toxic-appearing.  HENT:     Head: Normocephalic and atraumatic.     Nose: Congestion and rhinorrhea present.     Mouth/Throat:     Mouth: Mucous membranes are moist.     Pharynx: Oropharynx is clear. Posterior oropharyngeal erythema present.  Eyes:     General: No scleral icterus.       Right eye: No discharge.        Left eye: No discharge.     Conjunctiva/sclera: Conjunctivae normal.  Cardiovascular:     Rate and Rhythm: Normal rate and regular rhythm.     Heart sounds: Normal heart sounds.  Pulmonary:     Effort: Pulmonary effort is normal. No respiratory distress.     Breath sounds: Normal breath sounds. No wheezing, rhonchi or rales.  Musculoskeletal:     Cervical back: Neck supple.  Skin:    General: Skin is dry.  Neurological:     General: No focal deficit present.     Mental Status: She is alert. Mental status is at baseline.     Motor: No weakness.     Gait: Gait normal.  Psychiatric:        Mood and Affect: Mood normal.        Behavior: Behavior normal.        Thought Content: Thought content normal.     UC Treatments / Results  Labs (all labs ordered are listed, but only abnormal results are displayed) Labs Reviewed  RESP PANEL BY RT-PCR (FLU A&B, COVID) ARPGX2  GROUP A STREP BY PCR    EKG   Radiology No results found.  Procedures  Procedures (including critical care time)  Medications Ordered in UC Medications - No data to display  Initial Impression / Assessment and Plan / UC Course  I have reviewed the triage vital signs and the nursing notes.  Pertinent labs & imaging results that were available during my care of the patient were reviewed by me and considered in my medical decision making (see chart for details).  65 year old female presenting for 3-day history of fatigue and sore throat, headaches, congestion and mild cough for 2 days.  Vital signs are all stable.  She is  overall well-appearing.  Patient exam positive for congestion and rhinorrhea as well as posterior pharyngeal erythema.  Chest is clear to auscultation heart regular rate and rhythm.  Respiratory panel all negative.  PCR strep test negative.  Reviewed all these results with patient.  Advised patient she has a viral URI.  Supportive care encouraged.  Advised Mucinex during the day and Promethazine DM cough syrup at night.  Also suggested over-the-counter nasal saline and Flonase.  Advised following up if not feeling better within the next week or so or for any acute worsening of her symptoms.  ED precautions reviewed.   Final Clinical Impressions(s) / UC Diagnoses   Final diagnoses:  Viral upper respiratory tract infection  Nasal congestion  Sore throat  Cough     Discharge Instructions      The strep, flu and COVID tests are all negative.  Take Mucinex during the day and the promethazine cough syrup at bedtime.  Increase rest and fluids.  It is fine to use the cough drops as well.  You can also consider using nasal saline and Flonase for your congestion.  Can also consider Chloraseptic throat spray.  Tylenol Motrin for any achiness or discomfort.  This is likely a viral illness and you should feel better within 7 to 10 days but if you feel you are still running a fever or the cough worsens or you have a breathing issue, you should be seen again.     ED Prescriptions     Medication Sig Dispense Auth. Provider   promethazine-dextromethorphan (PROMETHAZINE-DM) 6.25-15 MG/5ML syrup Take 5 mLs by mouth at bedtime. 118 mL Shirlee Latch, PA-C      PDMP not reviewed this encounter.   Shirlee Latch, PA-C 11/18/20 1153

## 2020-12-23 ENCOUNTER — Ambulatory Visit: Payer: Medicare PPO

## 2020-12-24 ENCOUNTER — Encounter: Payer: Self-pay | Admitting: Gastroenterology

## 2020-12-24 ENCOUNTER — Ambulatory Visit (INDEPENDENT_AMBULATORY_CARE_PROVIDER_SITE_OTHER): Payer: Medicare PPO | Admitting: Gastroenterology

## 2020-12-24 ENCOUNTER — Other Ambulatory Visit: Payer: Self-pay

## 2020-12-24 VITALS — BP 117/73 | HR 77 | Temp 97.2°F | Ht 62.5 in | Wt 182.2 lb

## 2020-12-24 DIAGNOSIS — R1319 Other dysphagia: Secondary | ICD-10-CM | POA: Diagnosis not present

## 2020-12-24 DIAGNOSIS — K219 Gastro-esophageal reflux disease without esophagitis: Secondary | ICD-10-CM | POA: Diagnosis not present

## 2020-12-24 MED ORDER — PANTOPRAZOLE SODIUM 40 MG PO TBEC: 40.0000 mg | DELAYED_RELEASE_TABLET | Freq: Every day | ORAL | 3 refills | Status: DC

## 2020-12-24 NOTE — Progress Notes (Addendum)
Gastroenterology Consultation  Referring Provider:     Enid Baas, MD Primary Care Physician:  Enid Baas, MD Primary Gastroenterologist:  Dr. Servando Snare     Reason for Consultation:     Dysphagia        HPI:   Amy Huynh is a 65 y.o. y/o female referred for consultation & management of Dysphagia by Dr. Enid Baas, MD.  This patient comes in to see me after seeing me back in 2018 for transfer of care due to a history of irritable bowel syndrome.  The patient at that time had noted that she previously had a colonoscopy through [facility] gastroenterology and was recommended to have a repeat colonoscopy in 5 years from the time of her previous colonoscopy. The last colonoscopy on record was by Dr. Luan Pulling in 2017 and showed:  Recommendation:    - Patient has a contact number available for emergencies.                      The signs and symptoms of potential delayed complications                     were discussed with the patient. Return to normal                     activities tomorrow. Written discharge instructions were                     provided to the patient.                     - Resume previous diet.                     - Continue present medications.                     - Repeat colonoscopy in 10 years for screening purposes.                     - Await pathology results.                     - Return to GI clinic as previously scheduled.   The patient has also followed with [facility] for dysphonia.  The patient reports that her GERD has also been getting worse. The patient has been taking a generic Nexium but she feels different with each manufacture of the medication. She got to the point that she was not being helped so she has been on OTC Nexium 40mg .  The dysphagia is with bulky foods and not all the time. It has been getting more frequent.  Past Medical History:  Diagnosis Date   Arthritis    Family history of ovarian cancer 12/2016   MyRisk neg    Fibromyalgia    Migraine    Neuropathy    Placenta previa    Sjogren's disease (HCC)     Past Surgical History:  Procedure Laterality Date   CERVICAL FUSION     CESAREAN SECTION     CHOLECYSTECTOMY     HERNIA REPAIR     REPLACEMENT TOTAL KNEE Left    SHOULDER SURGERY Right     Prior to Admission medications   Medication Sig Start Date End Date Taking? Authorizing Provider  aspirin-acetaminophen-caffeine (EXCEDRIN MIGRAINE) [phone] MG tablet Take by mouth.    [provider]  butalbital-acetaminophen-caffeine (FIORICET, ESGIC) 50-325-40 MG tablet Take 1 tablet by mouth every 6 (six) hours as needed for headache.    [provider]  cetirizine (ZYRTEC) 10 MG tablet Take 10 mg by mouth daily.    [provider]  clobetasol cream (TEMOVATE) 0.05 % Apply topically. 09/07/17   [provider]  cyanocobalamin 1000 MCG tablet Take 1,000 mcg by mouth daily.    [provider]  esomeprazole (NEXIUM) 40 MG capsule Take 40 mg by mouth daily. bEFORE BREAKFAST    [provider]  fexofenadine (ALLEGRA) 180 MG tablet Take 180 mg by mouth daily.    [provider]  folic acid (FOLVITE) 1 MG tablet Take 1 mg by mouth daily. 08/16/19   [provider]  gabapentin (NEURONTIN) 300 MG capsule Take 900 mg by mouth at bedtime.    [provider]  hydrOXYzine (ATARAX/VISTARIL) 10 MG tablet Take 10 mg by mouth every 8 (eight) hours as needed.    [provider]  hyoscyamine (LEVSIN, ANASPAZ) 0.125 MG tablet Take 0.125 mg by mouth every 4 (four) hours as needed.    [provider]  methocarbamol (ROBAXIN) 500 MG tablet Take 500 mg by mouth at bedtime.  08/22/17   [provider]  methotrexate (RHEUMATREX) 2.5 MG tablet Take 25 mg by mouth once a week. Take 5 tablets by mouth in the morning and 5 tablets by mouth in the evening on Saturdays 02/08/18   [provider]  pilocarpine (SALAGEN) 5 MG  tablet Take 5 mg by mouth 3 (three) times daily.    [provider]  promethazine (PHENERGAN) 25 MG tablet Take 25 mg by mouth every 6 (six) hours as needed for nausea or vomiting.    [provider]  promethazine-dextromethorphan (PROMETHAZINE-DM) 6.25-15 MG/5ML syrup Take 5 mLs by mouth at bedtime. 11/18/20   Eusebio Friendly B, PA-C  rizatriptan (MAXALT) 10 MG tablet Take 10 mg by mouth as needed for migraine. May repeat in 2 hours if needed    [provider]  rOPINIRole (REQUIP) 0.5 MG tablet Take 1 tablet by mouth at bedtime.  07/26/19 11/18/20  [provider]  saccharomyces boulardii (FLORASTOR) 250 MG capsule Take 1 capsule (250 mg total) by mouth 2 (two) times daily. 09/16/19   Dhungel, Theda Belfast, MD  topiramate (TOPAMAX) 100 MG tablet Take 200 mg by mouth daily.    [provider]  valACYclovir (VALTREX) 1000 MG tablet Take 1 tablet once a day by mouth 07/14/16   [provider]  VITAMIN D, CHOLECALCIFEROL, PO Take by mouth.    [provider]    Family History  Problem Relation Age of Onset   Ovarian cancer Maternal Aunt 30   Uterine cancer Mother        2s   COPD Mother    Hypertension Mother    Uterine cancer Maternal Grandmother        ? age   Heart attack Father 79   Other Father        DDD     Social History   Tobacco Use   Smoking status: Never   Smokeless tobacco: Never  Vaping Use   Vaping Use: Never used  Substance Use Topics   Alcohol use: Yes    Comment: social   Drug use: Never    Allergies as of 12/24/2020 - Review Complete 11/18/2020  Allergen Reaction Noted   Hydrocodone-acetaminophen  09/24/2015   Hydrocodone-acetaminophen  09/24/2015   Morphine Itching  and Other (See Comments) 10/03/2012   Morphine and related Itching 10/03/2012   Cymbalta [duloxetine hcl] Other (See Comments) 12/29/2016   Duloxetine Other (See Comments) 07/23/2015   Pregabalin Other (See Comments) 09/24/2015    Review of  Systems:    All systems reviewed and negative except where noted in HPI.   Physical Exam:  There were no vitals taken for this visit. No LMP recorded. Patient is postmenopausal. General:   Alert,  Well-developed, well-nourished, pleasant and cooperative in NAD Head:  Normocephalic and atraumatic. Eyes:  Sclera clear, no icterus.   Conjunctiva pink. Ears:  Normal auditory acuity. Neck:  Supple; no masses or thyromegaly. Lungs:  Respirations even and unlabored.  Clear throughout to auscultation.   No wheezes, crackles, or rhonchi. No acute distress. Heart:  Regular rate and rhythm; no murmurs, clicks, rubs, or gallops. Abdomen:  Normal bowel sounds.  No bruits.  Soft, non-tender and non-distended without masses, hepatosplenomegaly or hernias noted.  No guarding or rebound tenderness.  Negative Carnett sign.   Rectal:  Deferred.  Pulses:  Normal pulses noted. Extremities:  No clubbing or edema.  No cyanosis. Neurologic:  Alert and oriented x3;  grossly normal neurologically. Skin:  Intact without significant lesions or rashes.  No jaundice. Lymph Nodes:  No significant cervical adenopathy. Psych:  Alert and cooperative. Normal mood and affect.  Imaging Studies: No results found.  Assessment and Plan:   Elleni Mozingo is a 65 y.o. y/o female who comes in today with worsening GERD symptoms. Her dysphagia is mostly to solids and bulky things and is intermittent but has been happening or frequently.  The patient will be started on protonix to see if that helps her better than the Nexium and she will also be set up for an EGD.  The patient has been in the plan and agrees with it.    Midge Minium, MD. Clementeen Graham    Note: This dictation was prepared with Dragon dictation along with smaller phrase technology. Any transcriptional errors that result from this process are unintentional.

## 2021-01-01 ENCOUNTER — Other Ambulatory Visit: Payer: Self-pay | Admitting: Internal Medicine

## 2021-01-01 DIAGNOSIS — R42 Dizziness and giddiness: Secondary | ICD-10-CM

## 2021-01-01 DIAGNOSIS — M542 Cervicalgia: Secondary | ICD-10-CM

## 2021-01-02 ENCOUNTER — Other Ambulatory Visit: Payer: Self-pay | Admitting: Pulmonary Disease

## 2021-01-02 ENCOUNTER — Other Ambulatory Visit (HOSPITAL_BASED_OUTPATIENT_CLINIC_OR_DEPARTMENT_OTHER): Payer: Self-pay | Admitting: Pulmonary Disease

## 2021-01-02 DIAGNOSIS — J849 Interstitial pulmonary disease, unspecified: Secondary | ICD-10-CM

## 2021-01-02 DIAGNOSIS — R0600 Dyspnea, unspecified: Secondary | ICD-10-CM

## 2021-01-02 DIAGNOSIS — R0609 Other forms of dyspnea: Secondary | ICD-10-CM

## 2021-01-08 ENCOUNTER — Ambulatory Visit
Admission: RE | Admit: 2021-01-08 | Discharge: 2021-01-08 | Disposition: A | Discharge status: Home / Self Care | Payer: Medicare PPO | Source: Ambulatory Visit | Attending: Pulmonary Disease | Admitting: Pulmonary Disease

## 2021-01-08 ENCOUNTER — Other Ambulatory Visit: Payer: Self-pay

## 2021-01-08 ENCOUNTER — Ambulatory Visit (HOSPITAL_BASED_OUTPATIENT_CLINIC_OR_DEPARTMENT_OTHER): Payer: Medicare PPO

## 2021-01-08 DIAGNOSIS — J849 Interstitial pulmonary disease, unspecified: Secondary | ICD-10-CM | POA: Insufficient documentation

## 2021-01-08 DIAGNOSIS — R0600 Dyspnea, unspecified: Secondary | ICD-10-CM | POA: Insufficient documentation

## 2021-01-08 DIAGNOSIS — R0609 Other forms of dyspnea: Secondary | ICD-10-CM

## 2021-01-08 IMAGING — CT CT CHEST W/O CM
2 of 4 series · 15 of 36 positions shown, 18 images · non-contrast
Comparison: None

CLINICAL DATA: Follow-up lung nodules, intermittent shortness of
breath, former smoker quit in [REDACTED]

EXAM:
CT CHEST WITHOUT CONTRAST
TECHNIQUE: Multidetector CT imaging of the chest was performed following the
standard protocol without IV contrast. Sagittal and coronal MPR
images reconstructed from axial data set.

[Series 2: chest 2.00 · axial · 0.60mm/px · z∈[-1192,-932]mm · 12 of 154 slices shown, 15 images]
[im 12/154  mediastinal]
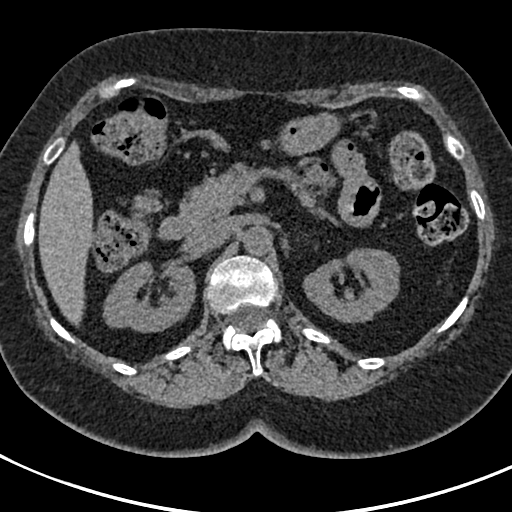
[im 12/154  lung]
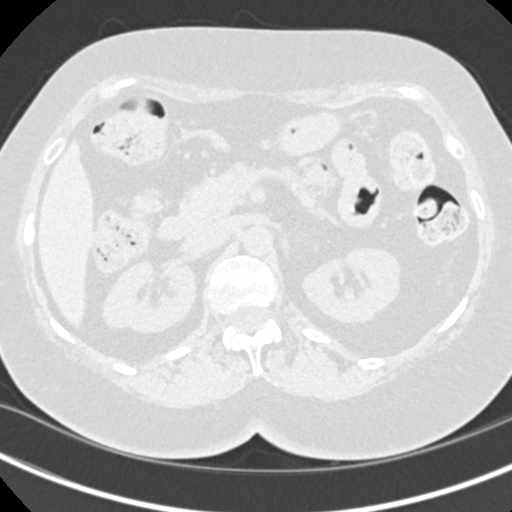
[im 24/154  lung]
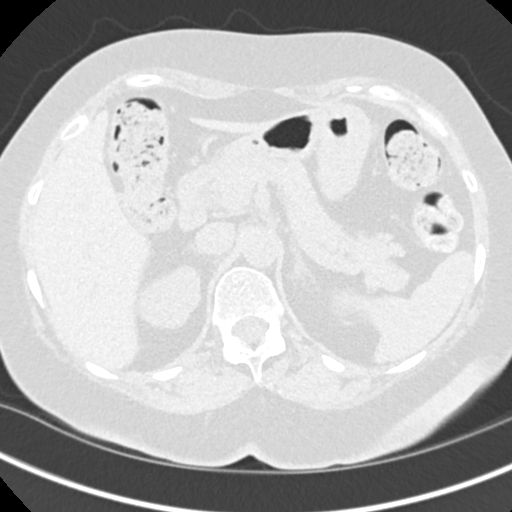
[im 36/154  lung]
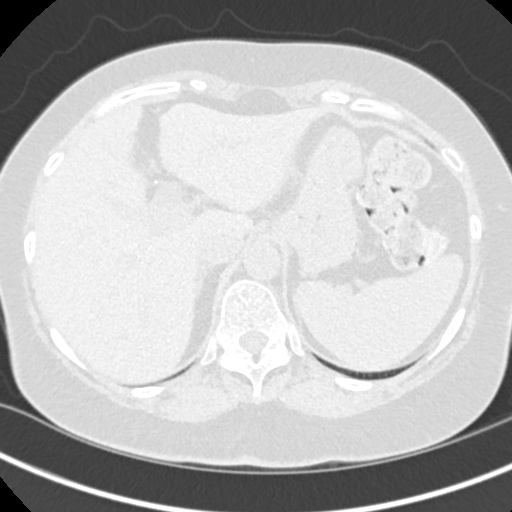
[im 48/154  lung]
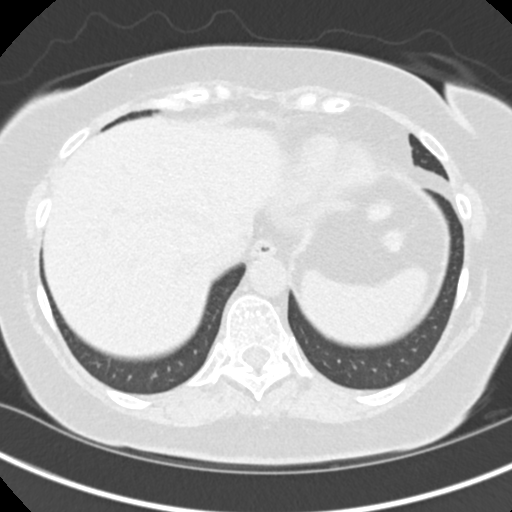
[im 59/154  mediastinal]
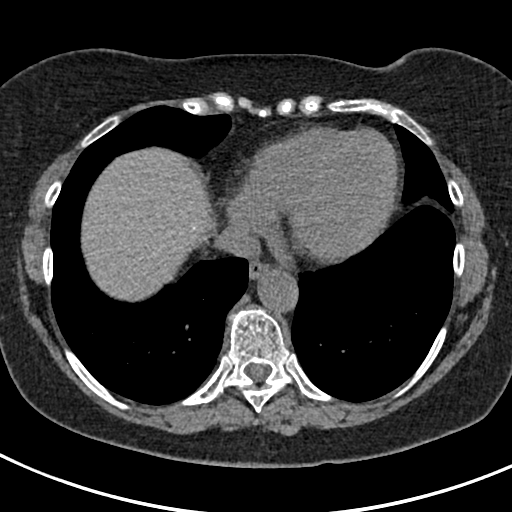
[im 59/154  lung]
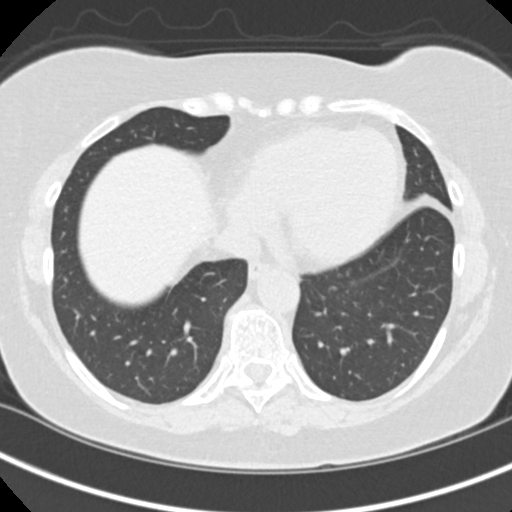
[im 71/154  lung]
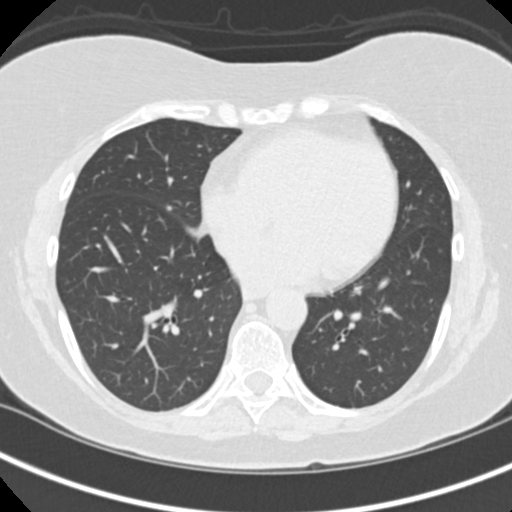
[im 83/154  lung]
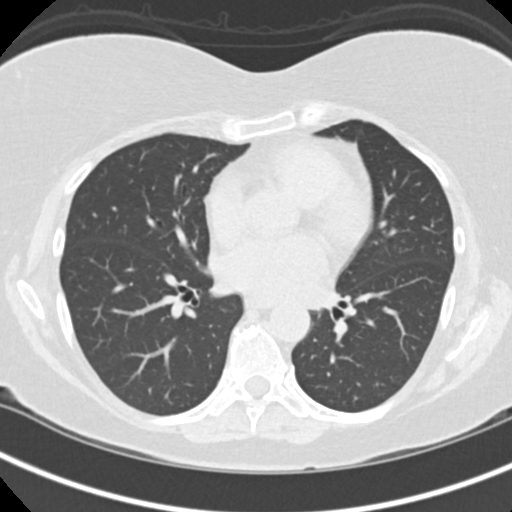
[im 95/154  lung]
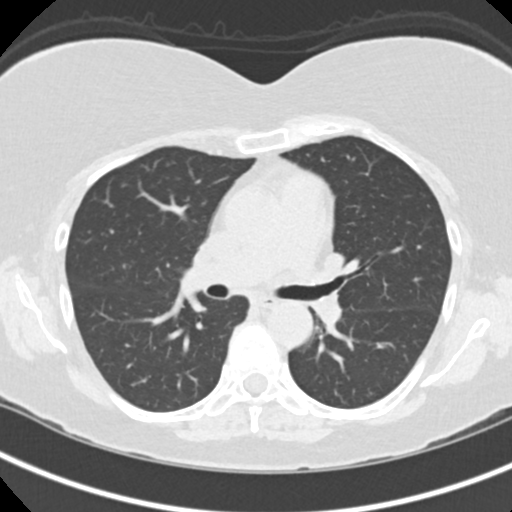
[im 106/154  mediastinal]
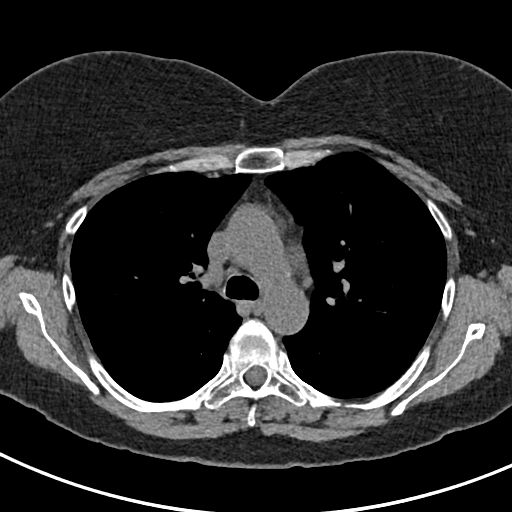
[im 106/154  lung]
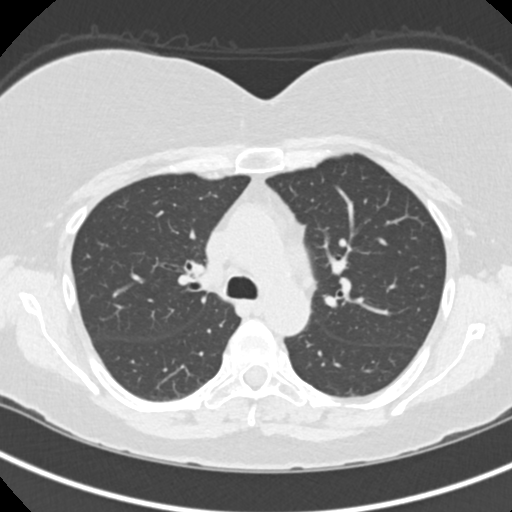
[im 118/154  lung]
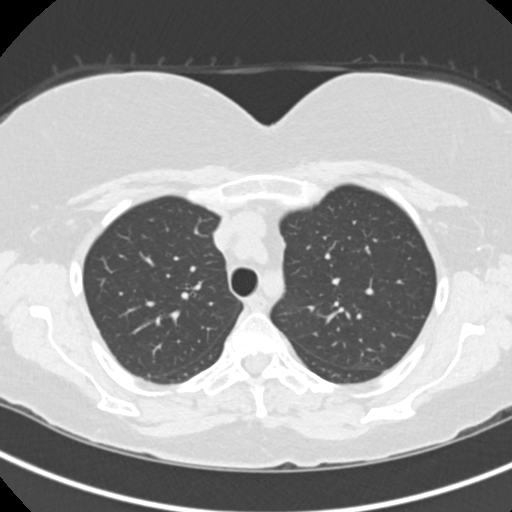
[im 130/154  lung]
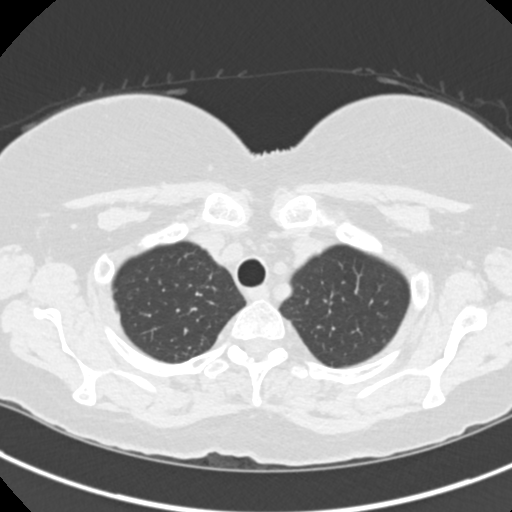
[im 142/154  lung]
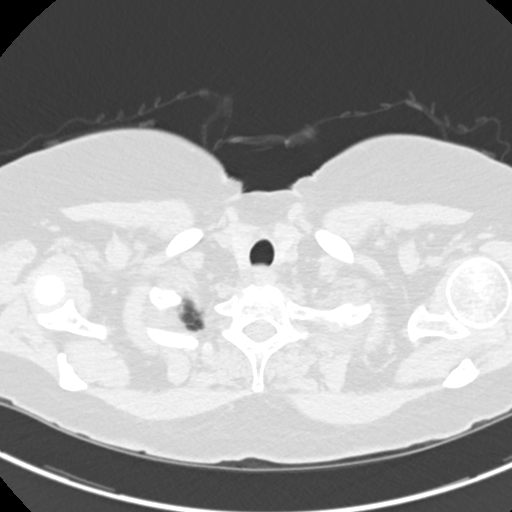

[Series 5: coronals chest 2.00 cor · coronal · 0.60mm/px · 3 of 134 slices shown]
[im 27/134  lung]
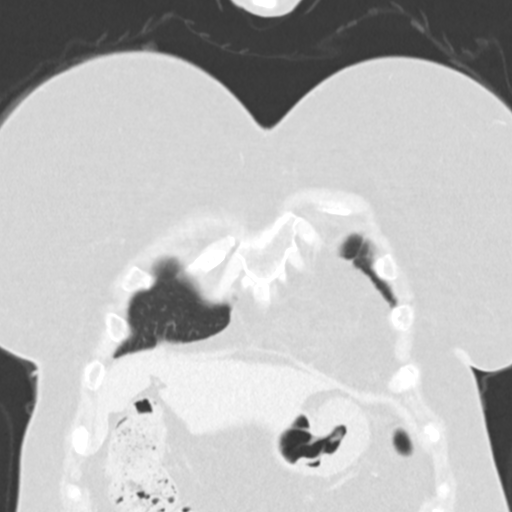
[im 54/134  lung]
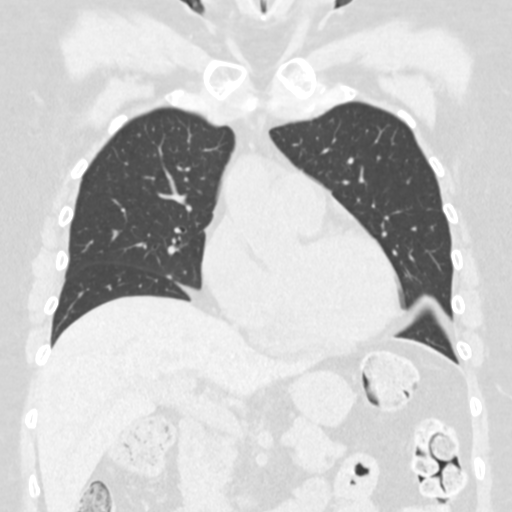
[im 80/134  lung]
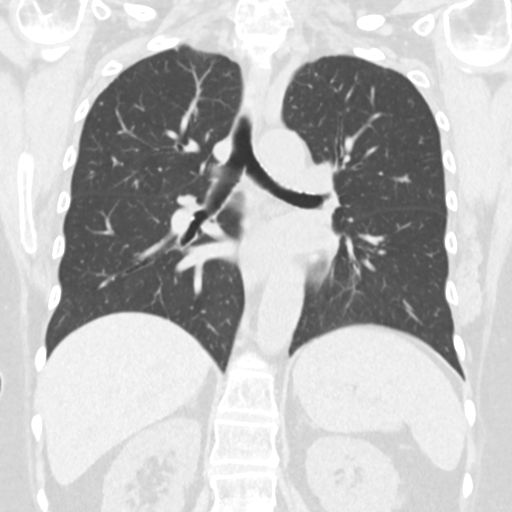

[15 of 36 positions shown; findings below may reference images not displayed]

FINDINGS: Cardiovascular: And normal caliber. Heart size normal. No
pericardial effusion.

Mediastinum/Nodes: Base of cervical region normal appearance. No
thoracic adenopathy. Esophagus unremarkable.

Lungs/Pleura: Tiny nodular foci RIGHT upper lobe images 50 5-56,
nonspecific. Tiny nodule superior segment RIGHT lower lobe image 44
measuring 2 mm diameter. Tiny subpleural nodule 2 mm in LEFT upper
lobe image 63. No pulmonary infiltrate, pleural effusion,
pneumothorax or additional nodule/mass.

Upper Abdomen: Gallbladder surgically absent. Otherwise
unremarkable.

Musculoskeletal: Bones demineralized.  Prior cervical spine fusion.
IMPRESSION: Tiny nonspecific pulmonary nodules, largest 2 mm in size; No
follow-up needed if patient is low-risk (and has no known or
suspected primary neoplasm). Non-contrast chest CT can be considered
in 12 months if patient is high-risk. This recommendation follows
the consensus statement: Guidelines for Management of Incidental
Pulmonary Nodules Detected on CT Images: From the [HOSPITAL]

No additional significant intrathoracic abnormalities.

## 2021-01-27 ENCOUNTER — Other Ambulatory Visit: Payer: Self-pay

## 2021-01-27 MED ORDER — PANTOPRAZOLE SODIUM 40 MG PO TBEC: 40.0000 mg | DELAYED_RELEASE_TABLET | Freq: Every day | ORAL | 6 refills | Status: DC

## 2021-01-28 ENCOUNTER — Other Ambulatory Visit: Payer: Self-pay

## 2021-01-28 MED ORDER — PANTOPRAZOLE SODIUM 40 MG PO TBEC: 40.0000 mg | DELAYED_RELEASE_TABLET | Freq: Two times a day (BID) | ORAL | 1 refills | Status: DC

## 2021-04-23 ENCOUNTER — Other Ambulatory Visit: Payer: Self-pay

## 2021-04-23 ENCOUNTER — Ambulatory Visit
Admission: RE | Admit: 2021-04-23 | Discharge: 2021-04-23 | Disposition: A | Discharge status: Home / Self Care | Payer: Medicare PPO | Source: Ambulatory Visit | Attending: Internal Medicine | Admitting: Internal Medicine

## 2021-04-23 VITALS — BP 132/88 | HR 77 | Temp 98.2°F | Resp 16

## 2021-04-23 DIAGNOSIS — J029 Acute pharyngitis, unspecified: Secondary | ICD-10-CM | POA: Diagnosis present

## 2021-04-23 DIAGNOSIS — Z20822 Contact with and (suspected) exposure to covid-19: Secondary | ICD-10-CM | POA: Diagnosis not present

## 2021-04-23 DIAGNOSIS — J069 Acute upper respiratory infection, unspecified: Secondary | ICD-10-CM | POA: Diagnosis not present

## 2021-04-23 DIAGNOSIS — R110 Nausea: Secondary | ICD-10-CM | POA: Insufficient documentation

## 2021-04-23 LAB — RESP PANEL BY RT-PCR (FLU A&B, COVID) ARPGX2
Influenza A by PCR: NEGATIVE
Influenza B by PCR: NEGATIVE
SARS Coronavirus 2 by RT PCR: NEGATIVE

## 2021-04-23 MED ORDER — BENZONATATE 100 MG PO CAPS: 200.0000 mg | ORAL_CAPSULE | Freq: Three times a day (TID) | ORAL | 0 refills | Status: DC

## 2021-04-23 MED ORDER — IPRATROPIUM BROMIDE 0.06 % NA SOLN: 2.0000 | Freq: Four times a day (QID) | NASAL | 12 refills | Status: DC

## 2021-04-23 MED ORDER — PROMETHAZINE-DM 6.25-15 MG/5ML PO SYRP: 5.0000 mL | ORAL_SOLUTION | Freq: Four times a day (QID) | ORAL | 0 refills | Status: DC | PRN

## 2021-04-23 NOTE — ED Provider Notes (Signed)
[facility]    CSN: [id number] Arrival date & time: 04/23/21  0945      History   Chief Complaint Chief Complaint  Patient presents with   Sore Throat   Fever   Headache    HPI Amy Huynh is a 65 y.o. female.   HPI  65 year old female here for evaluation of respiratory complaints.  Patient reports that her abdominal complaints have been a sore throat and a headache for the past 4 days.  She was exposed to COVID a week ago and is had a negative home COVID test.  She would like to be tested for COVID again today.  She also endorses runny nose and nasal congestion, ear pain, fatigue, and occasional nonproductive cough, nausea, and decreased appetite.  She denies any sinus pressure, shortness of breath or wheezing, abdominal pain, vomiting, or diarrhea.  Patient has had a fever with a T-max of 100.  Past Medical History:  Diagnosis Date   Arthritis    Family history of ovarian cancer 12/2016   MyRisk neg   Fibromyalgia    Migraine    Neuropathy    Placenta previa    Sjogren's disease ([place])     Patient Active Problem List   Diagnosis Date Noted   Bradycardia 10/16/2020   Stage 3a chronic kidney disease ([place]) 06/24/2020   Abnormal ECG 05/22/2020   History of degenerative disc disease 01/29/2020   LVH (left ventricular hypertrophy) 11/15/2019   Acute respiratory failure with hypoxia ([place]) 09/16/2019   Multilobar lung infiltrate 09/16/2019   Hypomagnesemia 09/16/2019   Muscle weakness (generalized) 09/16/2019   Community acquired pneumonia 09/12/2019   Hypokalemia 09/12/2019   Psoriasis 02/15/2019   Personal history of skin cancer 12/01/2018   Sjogren's syndrome ([place]) 10/11/2017   Family history of ovarian cancer 01/11/2017   Acute bursitis of right shoulder 06/07/2016   Partial tear of right rotator cuff 06/07/2016   Lumbar radiculopathy 07/23/2015   Migraine without aura and without status migrainosus, not intractable 04/23/2015   Chronic  daily headache 04/23/2015   DDD (degenerative disc disease), cervical 07/06/2014   Neck pain 07/06/2014   Dyspareunia 12/14/2013   Benign essential hypertension 09/13/2013   Polyarthralgia 07/20/2013   Diverticula of colon 01/22/2013   Fibromyalgia 01/22/2013   Restless legs syndrome 01/22/2013   Moderate obstructive sleep apnea 01/22/2013   Migraine headache 01/22/2013   Insomnia 01/22/2013   Generalized anxiety disorder 01/22/2013   Esophageal reflux 01/22/2013   Atrophic vaginitis 01/22/2013   Anemia 01/22/2013   Allergic rhinitis 01/22/2013   Urticaria 01/18/2013   Low back pain 10/03/2012   Lumbosacral spondylosis 08/17/2012    Past Surgical History:  Procedure Laterality Date   CERVICAL FUSION     CESAREAN SECTION     CHOLECYSTECTOMY     HERNIA REPAIR     REPLACEMENT TOTAL KNEE Left    SHOULDER SURGERY Right     OB History     Gravida  4   Para  3   Term  3   Preterm      AB  1   Living  2      SAB  1   IAB      Ectopic      Multiple      Live Births  3            Home Medications    Prior to Admission medications   Medication Sig Start Date End Date Taking? Authorizing Provider  benzonatate (TESSALON) 100 MG capsule Take 2 capsules (200 mg total) by mouth every 8 (eight) hours. 04/23/21  Yes Margarette Canada, NP  ipratropium (ATROVENT) 0.06 % nasal spray Place 2 sprays into both nostrils 4 (four) times daily. 04/23/21  Yes Margarette Canada, NP  promethazine-dextromethorphan (PROMETHAZINE-DM) 6.25-15 MG/5ML syrup Take 5 mLs by mouth 4 (four) times daily as needed. 04/23/21  Yes Margarette Canada, NP  aspirin-acetaminophen-caffeine (EXCEDRIN MIGRAINE) [phone] MG tablet Take by mouth.    [provider]  butalbital-acetaminophen-caffeine (FIORICET, ESGIC) 50-325-40 MG tablet Take 1 tablet by mouth every 6 (six) hours as needed for headache.    [provider]  cetirizine (ZYRTEC) 10 MG tablet Take 10 mg by mouth daily.    [provider]  clobetasol cream (TEMOVATE) 0.05 % Apply topically. 09/07/17   [provider]  cyanocobalamin 1000 MCG tablet Take 1,000 mcg by mouth daily.    [provider]  fexofenadine (ALLEGRA) 180 MG tablet Take 180 mg by mouth daily.    [provider]  folic acid (FOLVITE) 1 MG tablet Take 1 mg by mouth daily. 08/16/19   [provider]  gabapentin (NEURONTIN) 300 MG capsule Take 900 mg by mouth at bedtime.    [provider]  hydrOXYzine (ATARAX/VISTARIL) 10 MG tablet Take 10 mg by mouth every 8 (eight) hours as needed.    [provider]  hyoscyamine (LEVSIN, ANASPAZ) 0.125 MG tablet Take 0.125 mg by mouth every 4 (four) hours as needed.    [provider]  methocarbamol (ROBAXIN) 500 MG tablet Take 500 mg by mouth at bedtime.  08/22/17   [provider]  methotrexate (RHEUMATREX) 2.5 MG tablet Take 25 mg by mouth once a week. Take 5 tablets by mouth in the morning and 5 tablets by mouth in the evening on Saturdays 02/08/18   [provider]  pantoprazole (PROTONIX) 40 MG tablet Take 1 tablet (40 mg total) by mouth 2 (two) times daily. 01/28/21 04/28/21  Lucilla Lame, MD  pilocarpine (SALAGEN) 5 MG tablet Take 5 mg by mouth 3 (three) times daily.    [provider]  promethazine (PHENERGAN) 25 MG tablet Take 25 mg by mouth every 6 (six) hours as needed for nausea or vomiting.    [provider]  rizatriptan (MAXALT) 10 MG tablet Take 10 mg by mouth as needed for migraine. May repeat in 2 hours if needed    [provider]  rOPINIRole (REQUIP) 0.5 MG tablet Take 1 tablet by mouth at bedtime.  07/26/19 11/18/20  [provider]  topiramate (TOPAMAX) 100 MG tablet Take 200 mg by mouth daily.    [provider]  valACYclovir (VALTREX) 1000 MG tablet Take 1 tablet once a day by mouth 07/14/16   [provider]  VITAMIN D, CHOLECALCIFEROL, PO Take by mouth.    [provider]    Family History Family History  Problem Relation Age of Onset   Ovarian cancer Maternal Aunt 44   Uterine cancer Mother        57s   COPD Mother    Hypertension Mother    Uterine cancer Maternal Grandmother        ? age   Heart attack Father 20   Other Father        DDD    Social History Social History   Tobacco Use   Smoking status: Never   Smokeless tobacco: Never  Vaping Use   Vaping Use: Never  used  Substance Use Topics   Alcohol use: Yes    Comment: social   Drug use: Never     Allergies   Hydrocodone-acetaminophen, Hydrocodone-acetaminophen, Morphine, Morphine and related, Cymbalta [duloxetine hcl], Duloxetine, and Pregabalin   Review of Systems Review of Systems  Constitutional:  Positive for appetite change and fever. Negative for activity change.  HENT:  Positive for congestion, ear pain, rhinorrhea and sore throat.   Respiratory:  Positive for cough. Negative for shortness of breath and wheezing.   Gastrointestinal:  Positive for nausea. Negative for diarrhea and vomiting.  Skin:  Negative for rash.  Neurological:  Positive for headaches.  Hematological: Negative.   Psychiatric/Behavioral: Negative.      Physical Exam Triage Vital Signs ED Triage Vitals  Enc Vitals Group     BP 04/23/21 1045 132/88     Pulse Rate 04/23/21 1045 77     Resp 04/23/21 1045 16     Temp 04/23/21 1045 98.2 F (36.8 C)     Temp Source 04/23/21 1045 Oral     SpO2 04/23/21 1045 100 %     Weight --      Height --      Head Circumference --      Peak Flow --      Pain Score 04/23/21 1035 7     Pain Loc --      Pain Edu? --      Excl. in [place]? --    No data found.  Updated Vital Signs BP 132/88 (BP Location: Left Arm)   Pulse 77   Temp 98.2 F (36.8 C) (Oral)   Resp 16   SpO2 100%   Visual Acuity Right Eye Distance:   Left Eye Distance:   Bilateral Distance:    Right Eye Near:   Left Eye Near:    Bilateral Near:     Physical  Exam Vitals and nursing note reviewed.  Constitutional:      General: She is not in acute distress.    Appearance: Normal appearance. She is normal weight. She is not ill-appearing.  HENT:     Head: Normocephalic and atraumatic.     Right Ear: Tympanic membrane, ear canal and external ear normal. There is no impacted cerumen.     Left Ear: Tympanic membrane, ear canal and external ear normal. There is no impacted cerumen.     Nose: Congestion and rhinorrhea present.     Mouth/Throat:     Mouth: Mucous membranes are moist.     Pharynx: Oropharynx is clear. Posterior oropharyngeal erythema present.  Cardiovascular:     Rate and Rhythm: Normal rate and regular rhythm.     Pulses: Normal pulses.     Heart sounds: Normal heart sounds. No murmur heard.   No gallop.  Pulmonary:     Effort: Pulmonary effort is normal.     Breath sounds: Normal breath sounds. No wheezing, rhonchi or rales.  Musculoskeletal:     Cervical back: Normal range of motion and neck supple.  Lymphadenopathy:     Cervical: No cervical adenopathy.  Skin:    General: Skin is warm and dry.     Capillary Refill: Capillary refill takes less than 2 seconds.     Findings: No erythema or rash.  Neurological:     General: No focal deficit present.     Mental Status: She is alert and oriented to person, place, and time.  Psychiatric:        Mood and  Affect: Mood normal.        Behavior: Behavior normal.        Thought Content: Thought content normal.        Judgment: Judgment normal.     UC Treatments / Results  Labs (all labs ordered are listed, but only abnormal results are displayed) Labs Reviewed  RESP PANEL BY RT-PCR (FLU A&B, COVID) [id number]    EKG   Radiology No results found.  Procedures Procedures (including critical care time)  Medications Ordered in UC Medications - No data to display  Initial Impression / Assessment and Plan / UC Course  I have reviewed the triage vital signs and the nursing  notes.  Pertinent labs & imaging results that were available during my care of the patient were reviewed by me and considered in my medical decision making (see chart for details).  Patient is a very pleasant, nontoxic-appearing 65 year old female here for evaluation of respiratory complaints as outlined in the HPI above.  She has a concern for COVID as she had exposure 1 week ago.  She took a home COVID test yesterday that was negative and she is requesting a PCR test today.  Patient's physical exam reveals pearly gray tympanic membranes bilaterally with normal light reflex and clear external auditory canals.  Nasal mucosa is erythematous and edematous with clear nasal discharge in both nares.  Oropharyngeal exam reveals posterior oropharyngeal erythema and injection with clear postnasal drip.  Tonsillar pillars are unremarkable.  No cervical lymphadenopathy appreciated on exam.  Cardiopulmonary exam reveals clear lung sounds in all fields.  Respiratory triplex panel was collected at triage and is negative for COVID and influenza.  Patient's exam is consistent with a viral URI with cough.  We will treat her symptomatically with Atrovent nasal spray to help with the nasal congestion and postnasal drip which I believe is driving her sore throat, salt water gargles to also soothe the throat tissues, Tessalon Perles and Promethazine DM cough syrup as needed for cough and congestion.  Patient advised to return for new or worsening symptoms.   Final Clinical Impressions(s) / UC Diagnoses   Final diagnoses:  Viral URI with cough     Discharge Instructions      Use the Atrovent nasal spray, 2 squirts in each nostril every 6 hours, as needed for runny nose and postnasal drip.  Use the Tessalon Perles every 8 hours during the day.  Take them with a small sip of water.  They may give you some numbness to the base of your tongue or a metallic taste in your mouth, this is normal.  Use the Promethazine DM  cough syrup at bedtime for cough and congestion.  It will make you drowsy so do not take it during the day.  Use OTC Tylenol and Ibuprofen as needed for fever and pain.   Gargle with warm salt water 2-3 times a day to help wash away the drainage from your postnasal drip and soothe your throat tissues to aid in pain relief.  Return for reevaluation or see your primary care provider for any new or worsening symptoms.      ED Prescriptions     Medication Sig Dispense Auth. Provider   benzonatate (TESSALON) 100 MG capsule Take 2 capsules (200 mg total) by mouth every 8 (eight) hours. 21 capsule Margarette Canada, NP   ipratropium (ATROVENT) 0.06 % nasal spray Place 2 sprays into both nostrils 4 (four) times daily. 15 mL Margarette Canada, NP   promethazine-dextromethorphan (  PROMETHAZINE-DM) 6.25-15 MG/5ML syrup Take 5 mLs by mouth 4 (four) times daily as needed. 118 mL Becky Augusta, NP      PDMP not reviewed this encounter.   Becky Augusta, NP 04/23/21 1153

## 2021-04-23 NOTE — Discharge Instructions (Addendum)
Use the Atrovent nasal spray, 2 squirts in each nostril every 6 hours, as needed for runny nose and postnasal drip.  Use the Tessalon Perles every 8 hours during the day.  Take them with a small sip of water.  They may give you some numbness to the base of your tongue or a metallic taste in your mouth, this is normal.  Use the Promethazine DM cough syrup at bedtime for cough and congestion.  It will make you drowsy so do not take it during the day.  Use OTC Tylenol and Ibuprofen as needed for fever and pain.   Gargle with warm salt water 2-3 times a day to help wash away the drainage from your postnasal drip and soothe your throat tissues to aid in pain relief.  Return for reevaluation or see your primary care provider for any new or worsening symptoms.

## 2021-04-23 NOTE — ED Triage Notes (Addendum)
Patient presents to Urgent Care with complaints of sore throat and headache since Sunday. Pt states she was exposed to covid last Thursday. Had a negative at home covid test yesterday. Requesting a covid test. Treating headache with Excedrin.

## 2021-06-18 ENCOUNTER — Encounter: Payer: Self-pay | Admitting: Gastroenterology

## 2021-06-18 ENCOUNTER — Ambulatory Visit: Payer: Medicare PPO | Admitting: Gastroenterology

## 2021-06-18 VITALS — BP 122/85 | HR 76 | Temp 97.6°F | Wt 172.0 lb

## 2021-06-18 DIAGNOSIS — R1013 Epigastric pain: Secondary | ICD-10-CM | POA: Diagnosis not present

## 2021-06-18 DIAGNOSIS — K921 Melena: Secondary | ICD-10-CM

## 2021-06-18 DIAGNOSIS — R634 Abnormal weight loss: Secondary | ICD-10-CM | POA: Diagnosis not present

## 2021-06-18 NOTE — H&P (View-Only) (Signed)
Primary Care Physician: Gladstone Lighter, MD  Primary Gastroenterologist:  Dr. Lucilla Lame  Chief Complaint  Patient presents with   Follow-up    HPI: Amy Huynh is a 66 y.o. female here with a history of seeing me back in August of last year for dysphagia.  At that time the patient was reporting GERD symptoms.  The patient was recommended to undergo an EGD and did not have that done.  The patient was recently in the ER in December of this year with possible exposure to COVID and tested negative in the ER.  Last time the patient saw me she reported that her Nexium was not working for her and she was switched over to [employer].   She says that in December the GERD came back on the Protonix. She reports dark stools in November. She was told by urgent care that she may have H. Pylori that was negative. She was told that she may need an EGD by her PCP. She was put on antibiotics for possible antibiotics that were "very hard" to take.  She has lost 5 lbs is the last month.  The patient also reports that she was told by her primary care provider that she could have problems with her pancreas because of her Sjogren's disease and was told to ask me about it.  Past Medical History:  Diagnosis Date   Arthritis    Family history of ovarian cancer 12/2016   MyRisk neg   Fibromyalgia    Migraine    Neuropathy    Placenta previa    Sjogren's disease ([place])     Current Outpatient Medications  Medication Sig Dispense Refill   aspirin-acetaminophen-caffeine (EXCEDRIN MIGRAINE) 250-250-65 MG tablet Take by mouth.     benzonatate (TESSALON) 100 MG capsule Take 2 capsules (200 mg total) by mouth every 8 (eight) hours. 21 capsule 0   butalbital-acetaminophen-caffeine (FIORICET, ESGIC) 50-325-40 MG tablet Take 1 tablet by mouth every 6 (six) hours as needed for headache.     cetirizine (ZYRTEC) 10 MG tablet Take 10 mg by mouth daily.     clobetasol cream (TEMOVATE) 0.05 % Apply topically.      cyanocobalamin 1000 MCG tablet Take 1,000 mcg by mouth daily.     fexofenadine (ALLEGRA) 180 MG tablet Take 180 mg by mouth daily.     folic acid (FOLVITE) 1 MG tablet Take 1 mg by mouth daily.     gabapentin (NEURONTIN) 300 MG capsule Take 900 mg by mouth at bedtime.     hydrOXYzine (ATARAX/VISTARIL) 10 MG tablet Take 10 mg by mouth every 8 (eight) hours as needed.     hyoscyamine (LEVSIN, ANASPAZ) 0.125 MG tablet Take 0.125 mg by mouth every 4 (four) hours as needed.     ipratropium (ATROVENT) 0.06 % nasal spray Place 2 sprays into both nostrils 4 (four) times daily. 15 mL 12   methocarbamol (ROBAXIN) 500 MG tablet Take 500 mg by mouth at bedtime.      methotrexate (RHEUMATREX) 2.5 MG tablet Take 25 mg by mouth once a week. Take 5 tablets by mouth in the morning and 5 tablets by mouth in the evening on Saturdays     pantoprazole (PROTONIX) 40 MG tablet Take 1 tablet (40 mg total) by mouth 2 (two) times daily. 180 tablet 1   pilocarpine (SALAGEN) 5 MG tablet Take 5 mg by mouth 3 (three) times daily.     promethazine (PHENERGAN) 25 MG tablet Take 25 mg by mouth  every 6 (six) hours as needed for nausea or vomiting.     promethazine-dextromethorphan (PROMETHAZINE-DM) 6.25-15 MG/5ML syrup Take 5 mLs by mouth 4 (four) times daily as needed. 118 mL 0   rizatriptan (MAXALT) 10 MG tablet Take 10 mg by mouth as needed for migraine. May repeat in 2 hours if needed     rOPINIRole (REQUIP) 0.5 MG tablet Take 1 tablet by mouth at bedtime.      topiramate (TOPAMAX) 100 MG tablet Take 200 mg by mouth daily.     valACYclovir (VALTREX) 1000 MG tablet Take 1 tablet once a day by mouth     VITAMIN D, CHOLECALCIFEROL, PO Take by mouth.     No current facility-administered medications for this visit.    Allergies as of 06/18/2021 - Review Complete 04/23/2021  Allergen Reaction Noted   Hydrocodone-acetaminophen  09/24/2015   Hydrocodone-acetaminophen  09/24/2015   Morphine Itching and Other (See Comments)  10/03/2012   Morphine and related Itching 10/03/2012   Cymbalta [duloxetine hcl] Other (See Comments) 12/29/2016   Duloxetine Other (See Comments) 07/23/2015   Pregabalin Other (See Comments) 09/24/2015    ROS:  General: Negative for anorexia, weight loss, fever, chills, fatigue, weakness. ENT: Negative for hoarseness, difficulty swallowing , nasal congestion. CV: Negative for chest pain, angina, palpitations, dyspnea on exertion, peripheral edema.  Respiratory: Negative for dyspnea at rest, dyspnea on exertion, cough, sputum, wheezing.  GI: See history of present illness. GU:  Negative for dysuria, hematuria, urinary incontinence, urinary frequency, nocturnal urination.  Endo: Negative for unusual weight change.    Physical Examination:   BP 122/85    Pulse 76    Temp 97.6 F (36.4 C) (Oral)    Wt 172 lb (78 kg)    BMI 30.96 kg/m   General: Well-nourished, well-developed in no acute distress.  Eyes: No icterus. Conjunctivae pink. Lungs: Clear to auscultation bilaterally. Non-labored. Heart: Regular rate and rhythm, no murmurs rubs or gallops.  Abdomen: Bowel sounds are normal, nontender, nondistended, no hepatosplenomegaly or masses, no abdominal bruits or hernia , no rebound or guarding.   Extremities: No lower extremity edema. No clubbing or deformities. Neuro: Alert and oriented x 3.  Grossly intact. Skin: Warm and dry, no jaundice.   Psych: Alert and cooperative, normal mood and affect.  Labs:    Imaging Studies: No results found.  Assessment and Plan:   Amy Huynh is a 66 y.o. y/o female who comes in today with a history of black stools with dyspepsia despite taking multiple medications when she has these attacks.  The patient was in urgent care and was given a GI cocktail that she reports only lasted about 5 minutes.  The patient will be set up for an upper endoscopy because of her symptoms despite being on a PPI.  The patient will also have her stool sent  off for pancreatic elastase to evaluate pancreatic function.  The patient has been explained the plan agrees with it.     Lucilla Lame, MD. Marval Regal    Note: This dictation was prepared with Dragon dictation along with smaller phrase technology. Any transcriptional errors that result from this process are unintentional.

## 2021-06-18 NOTE — Addendum Note (Signed)
Addended by: Roena Malady on: 06/18/2021 03:58 PM   Modules accepted: Orders

## 2021-06-18 NOTE — Progress Notes (Signed)
Primary Care Physician: Gladstone Lighter, MD  Primary Gastroenterologist:  Dr. Lucilla Lame  Chief Complaint  Patient presents with   Follow-up    HPI: Amy Huynh is a 66 y.o. female here with a history of seeing me back in August of last year for dysphagia.  At that time the patient was reporting GERD symptoms.  The patient was recommended to undergo an EGD and did not have that done.  The patient was recently in the ER in December of this year with possible exposure to COVID and tested negative in the ER.  Last time the patient saw me she reported that her Nexium was not working for her and she was switched over to [employer].   She says that in December the GERD came back on the Protonix. She reports dark stools in November. She was told by urgent care that she may have H. Pylori that was negative. She was told that she may need an EGD by her PCP. She was put on antibiotics for possible antibiotics that were "very hard" to take.  She has lost 5 lbs is the last month.  The patient also reports that she was told by her primary care provider that she could have problems with her pancreas because of her Sjogren's disease and was told to ask me about it.  Past Medical History:  Diagnosis Date   Arthritis    Family history of ovarian cancer 12/2016   MyRisk neg   Fibromyalgia    Migraine    Neuropathy    Placenta previa    Sjogren's disease ([place])     Current Outpatient Medications  Medication Sig Dispense Refill   aspirin-acetaminophen-caffeine (EXCEDRIN MIGRAINE) 250-250-65 MG tablet Take by mouth.     benzonatate (TESSALON) 100 MG capsule Take 2 capsules (200 mg total) by mouth every 8 (eight) hours. 21 capsule 0   butalbital-acetaminophen-caffeine (FIORICET, ESGIC) 50-325-40 MG tablet Take 1 tablet by mouth every 6 (six) hours as needed for headache.     cetirizine (ZYRTEC) 10 MG tablet Take 10 mg by mouth daily.     clobetasol cream (TEMOVATE) 0.05 % Apply topically.      cyanocobalamin 1000 MCG tablet Take 1,000 mcg by mouth daily.     fexofenadine (ALLEGRA) 180 MG tablet Take 180 mg by mouth daily.     folic acid (FOLVITE) 1 MG tablet Take 1 mg by mouth daily.     gabapentin (NEURONTIN) 300 MG capsule Take 900 mg by mouth at bedtime.     hydrOXYzine (ATARAX/VISTARIL) 10 MG tablet Take 10 mg by mouth every 8 (eight) hours as needed.     hyoscyamine (LEVSIN, ANASPAZ) 0.125 MG tablet Take 0.125 mg by mouth every 4 (four) hours as needed.     ipratropium (ATROVENT) 0.06 % nasal spray Place 2 sprays into both nostrils 4 (four) times daily. 15 mL 12   methocarbamol (ROBAXIN) 500 MG tablet Take 500 mg by mouth at bedtime.      methotrexate (RHEUMATREX) 2.5 MG tablet Take 25 mg by mouth once a week. Take 5 tablets by mouth in the morning and 5 tablets by mouth in the evening on Saturdays     pantoprazole (PROTONIX) 40 MG tablet Take 1 tablet (40 mg total) by mouth 2 (two) times daily. 180 tablet 1   pilocarpine (SALAGEN) 5 MG tablet Take 5 mg by mouth 3 (three) times daily.     promethazine (PHENERGAN) 25 MG tablet Take 25 mg by mouth  every 6 (six) hours as needed for nausea or vomiting.     promethazine-dextromethorphan (PROMETHAZINE-DM) 6.25-15 MG/5ML syrup Take 5 mLs by mouth 4 (four) times daily as needed. 118 mL 0   rizatriptan (MAXALT) 10 MG tablet Take 10 mg by mouth as needed for migraine. May repeat in 2 hours if needed     rOPINIRole (REQUIP) 0.5 MG tablet Take 1 tablet by mouth at bedtime.      topiramate (TOPAMAX) 100 MG tablet Take 200 mg by mouth daily.     valACYclovir (VALTREX) 1000 MG tablet Take 1 tablet once a day by mouth     VITAMIN D, CHOLECALCIFEROL, PO Take by mouth.     No current facility-administered medications for this visit.    Allergies as of 06/18/2021 - Review Complete 04/23/2021  Allergen Reaction Noted   Hydrocodone-acetaminophen  09/24/2015   Hydrocodone-acetaminophen  09/24/2015   Morphine Itching and Other (See Comments)  10/03/2012   Morphine and related Itching 10/03/2012   Cymbalta [duloxetine hcl] Other (See Comments) 12/29/2016   Duloxetine Other (See Comments) 07/23/2015   Pregabalin Other (See Comments) 09/24/2015    ROS:  General: Negative for anorexia, weight loss, fever, chills, fatigue, weakness. ENT: Negative for hoarseness, difficulty swallowing , nasal congestion. CV: Negative for chest pain, angina, palpitations, dyspnea on exertion, peripheral edema.  Respiratory: Negative for dyspnea at rest, dyspnea on exertion, cough, sputum, wheezing.  GI: See history of present illness. GU:  Negative for dysuria, hematuria, urinary incontinence, urinary frequency, nocturnal urination.  Endo: Negative for unusual weight change.    Physical Examination:   BP 122/85    Pulse 76    Temp 97.6 F (36.4 C) (Oral)    Wt 172 lb (78 kg)    BMI 30.96 kg/m   General: Well-nourished, well-developed in no acute distress.  Eyes: No icterus. Conjunctivae pink. Lungs: Clear to auscultation bilaterally. Non-labored. Heart: Regular rate and rhythm, no murmurs rubs or gallops.  Abdomen: Bowel sounds are normal, nontender, nondistended, no hepatosplenomegaly or masses, no abdominal bruits or hernia , no rebound or guarding.   Extremities: No lower extremity edema. No clubbing or deformities. Neuro: Alert and oriented x 3.  Grossly intact. Skin: Warm and dry, no jaundice.   Psych: Alert and cooperative, normal mood and affect.  Labs:    Imaging Studies: No results found.  Assessment and Plan:   Amy Huynh is a 66 y.o. y/o y/o female who comes in today with a history of black stools with dyspepsia despite taking multiple medications when she has these attacks.  The patient was in urgent care and was given a GI cocktail that she reports only lasted about 5 minutes.  The patient will be set up for an upper endoscopy because of her symptoms despite being on a PPI.  The patient will also have her stool sent  off for pancreatic elastase to evaluate pancreatic function.  The patient has been explained the plan agrees with it.     Lucilla Lame, MD. [facility] Regal    Note: This dictation was prepared with Dragon dictation along with smaller phrase technology. Any transcriptional errors that result from this process are unintentional.

## 2021-06-19 ENCOUNTER — Encounter: Payer: Self-pay | Admitting: Gastroenterology

## 2021-06-19 ENCOUNTER — Other Ambulatory Visit (HOSPITAL_COMMUNITY): Payer: Self-pay | Admitting: Physician Assistant

## 2021-06-19 ENCOUNTER — Ambulatory Visit: Payer: Medicare PPO

## 2021-06-19 ENCOUNTER — Other Ambulatory Visit: Payer: Self-pay | Admitting: Physician Assistant

## 2021-06-19 DIAGNOSIS — M79661 Pain in right lower leg: Secondary | ICD-10-CM

## 2021-06-22 ENCOUNTER — Ambulatory Visit
Admission: RE | Admit: 2021-06-22 | Discharge: 2021-06-22 | Disposition: A | Discharge status: Home / Self Care | Payer: Medicare PPO | Source: Ambulatory Visit | Attending: Physician Assistant | Admitting: Physician Assistant

## 2021-06-22 ENCOUNTER — Other Ambulatory Visit: Payer: Self-pay

## 2021-06-22 DIAGNOSIS — M79661 Pain in right lower leg: Secondary | ICD-10-CM | POA: Insufficient documentation

## 2021-06-22 IMAGING — US US EXTREM LOW VENOUS*R*
1 series · 13 of 24 positions shown · non-contrast
Comparison: None.

CLINICAL DATA: Right calf pain and bruising. Remote history of
right lower extremity DVT.



[Series 1: us extrem low venous*right* · 0.06mm/px · 13 of 33 slices shown]
[im 1/33]
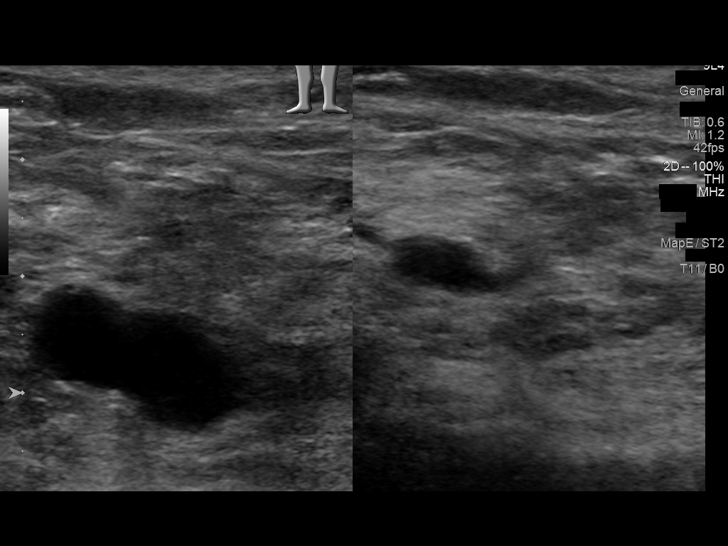
[im 3/33]
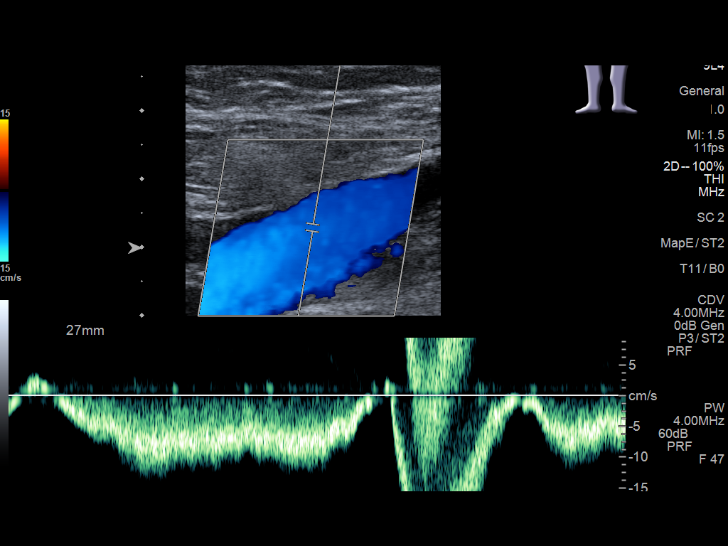
[im 6/33]
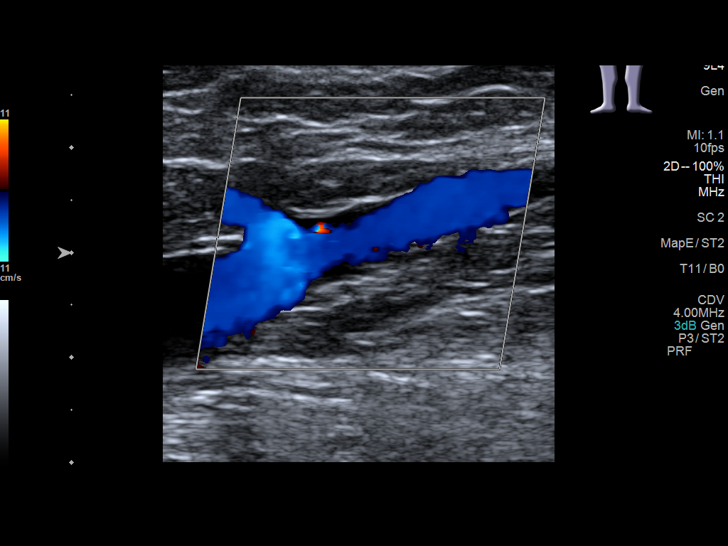
[im 9/33]
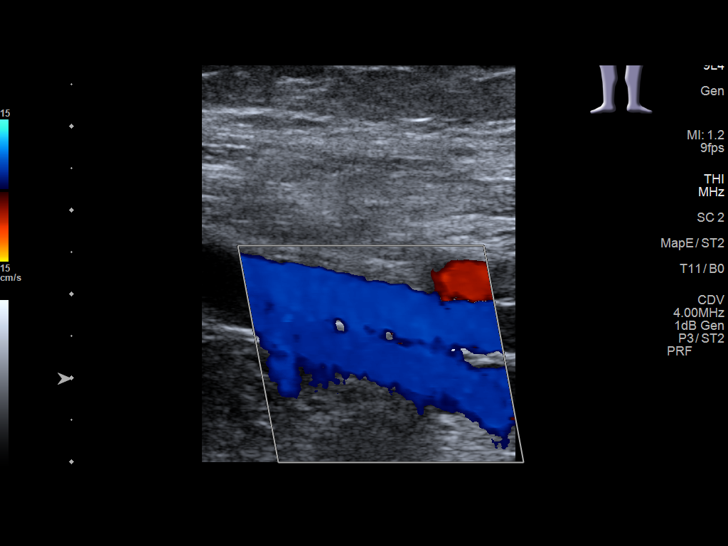
[im 12/33]
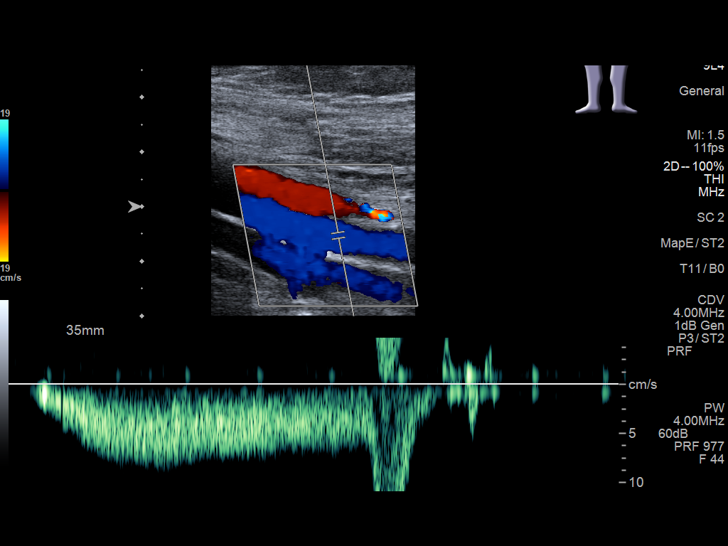
[im 14/33]
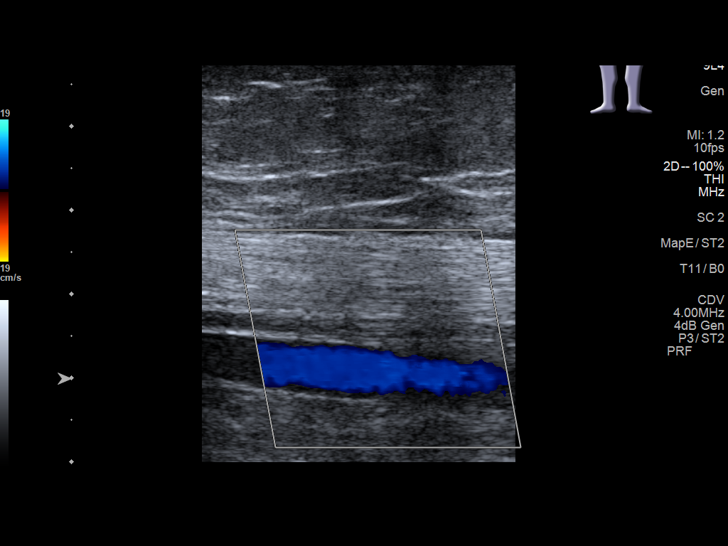
[im 17/33]
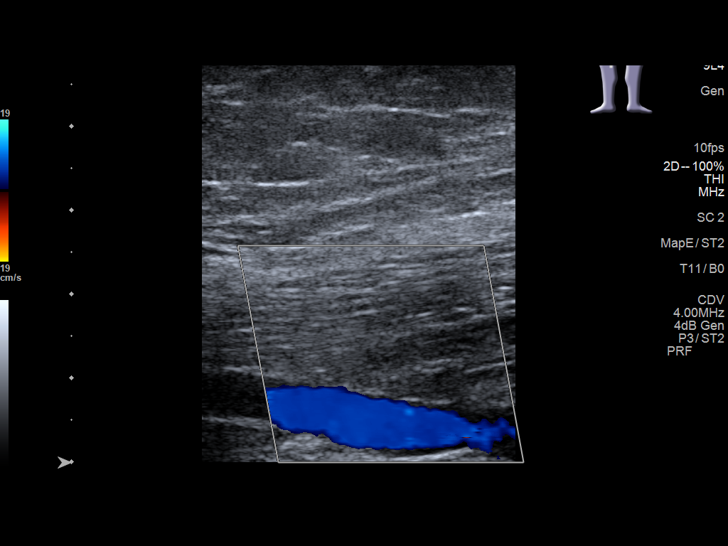
[im 19/33]
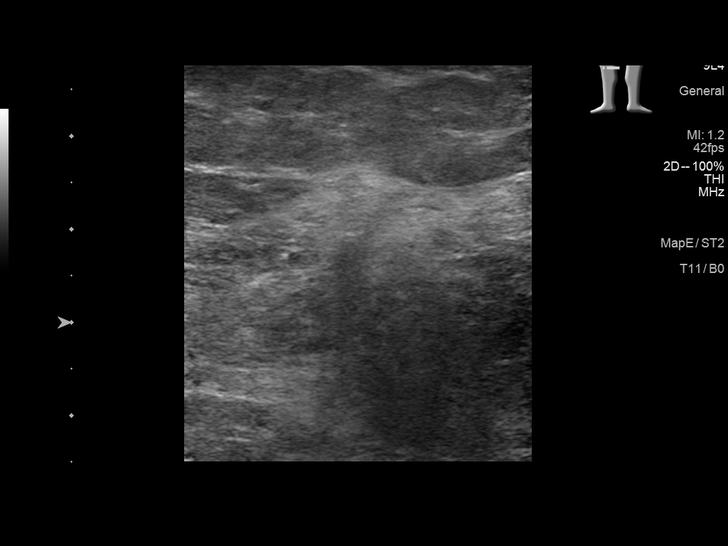
[im 21/33]
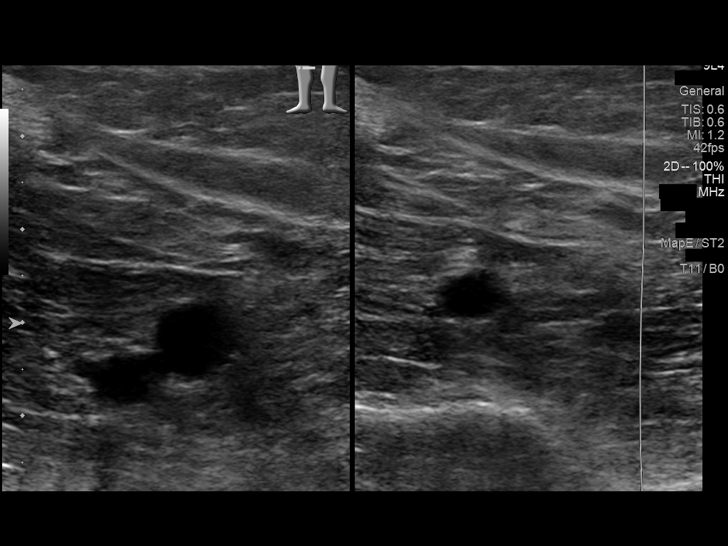
[im 24/33]
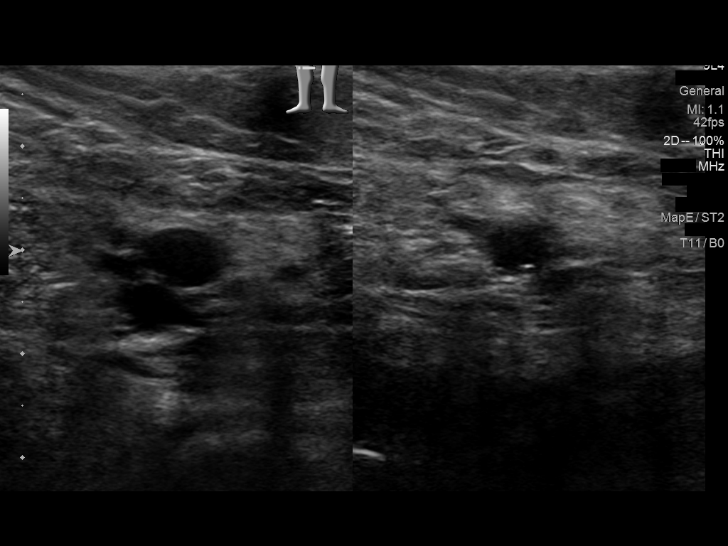
[im 27/33]
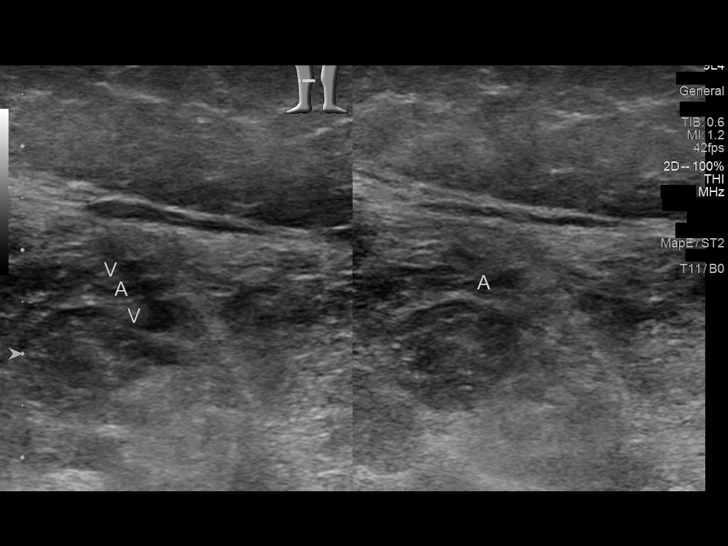
[im 30/33]
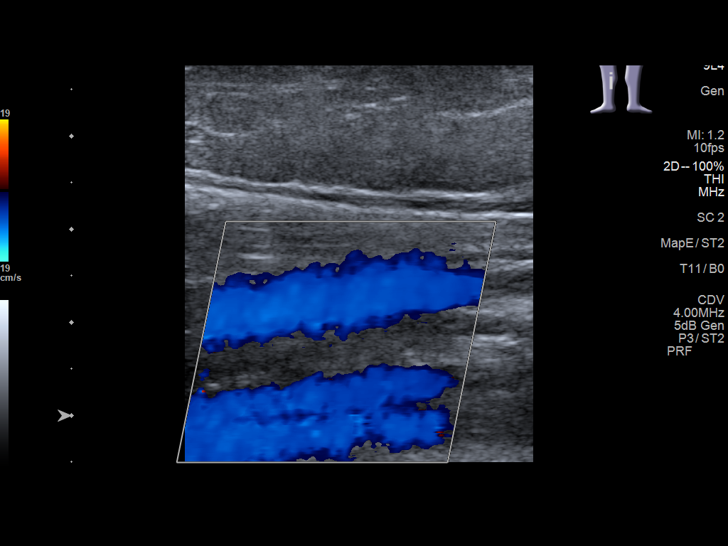
[im 33/33]
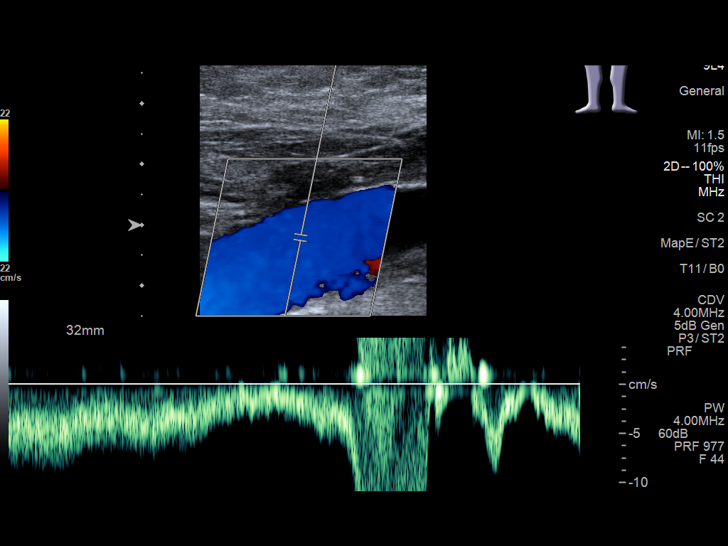

[13 of 24 positions shown; findings below may reference images not displayed]

FINDINGS: Contralateral Common Femoral Vein: Respiratory phasicity is normal
and symmetric with the symptomatic side. No evidence of thrombus.
Normal compressibility.

Common Femoral Vein: No evidence of thrombus. Normal
compressibility, respiratory phasicity and response to augmentation.

Saphenofemoral Junction: No evidence of thrombus. Normal
compressibility and flow on color Doppler imaging.

Profunda Femoral Vein: No evidence of thrombus. Normal
compressibility and flow on color Doppler imaging.

Femoral Vein: No evidence of thrombus. Normal compressibility,
respiratory phasicity and response to augmentation.

Popliteal Vein: No evidence of thrombus. Normal compressibility,
respiratory phasicity and response to augmentation.

Calf Veins: No evidence of thrombus. Normal compressibility and flow
on color Doppler imaging.

Superficial Great Saphenous Vein: No evidence of thrombus. Normal
compressibility.

Venous Reflux:  None.

Other Findings: No evidence of superficial thrombophlebitis or
abnormal fluid collection.
IMPRESSION: No evidence of right lower extremity deep venous thrombosis.

## 2021-06-24 LAB — PANCREATIC ELASTASE, FECAL: Pancreatic Elastase, Fecal: 417 ug Elast./g (ref >200)

## 2021-06-26 ENCOUNTER — Encounter
Admission: RE | Disposition: A | Discharge status: Home / Self Care | Payer: Self-pay | Source: Home / Self Care | Attending: Gastroenterology

## 2021-06-26 ENCOUNTER — Encounter: Payer: Self-pay | Admitting: Gastroenterology

## 2021-06-26 ENCOUNTER — Ambulatory Visit: Payer: Medicare PPO | Admitting: Anesthesiology

## 2021-06-26 ENCOUNTER — Ambulatory Visit
Admission: RE | Admit: 2021-06-26 | Discharge: 2021-06-26 | Disposition: A | Discharge status: Home / Self Care | Payer: Medicare PPO | Attending: Gastroenterology | Admitting: Gastroenterology

## 2021-06-26 ENCOUNTER — Other Ambulatory Visit: Payer: Self-pay

## 2021-06-26 DIAGNOSIS — R12 Heartburn: Secondary | ICD-10-CM | POA: Diagnosis present

## 2021-06-26 DIAGNOSIS — K253 Acute gastric ulcer without hemorrhage or perforation: Secondary | ICD-10-CM | POA: Diagnosis not present

## 2021-06-26 DIAGNOSIS — Z86718 Personal history of other venous thrombosis and embolism: Secondary | ICD-10-CM | POA: Insufficient documentation

## 2021-06-26 DIAGNOSIS — K2900 Acute gastritis without bleeding: Secondary | ICD-10-CM | POA: Insufficient documentation

## 2021-06-26 DIAGNOSIS — F419 Anxiety disorder, unspecified: Secondary | ICD-10-CM | POA: Insufficient documentation

## 2021-06-26 DIAGNOSIS — K259 Gastric ulcer, unspecified as acute or chronic, without hemorrhage or perforation: Secondary | ICD-10-CM | POA: Insufficient documentation

## 2021-06-26 DIAGNOSIS — M3500 Sicca syndrome, unspecified: Secondary | ICD-10-CM | POA: Insufficient documentation

## 2021-06-26 DIAGNOSIS — K921 Melena: Secondary | ICD-10-CM | POA: Diagnosis not present

## 2021-06-26 DIAGNOSIS — Z6831 Body mass index (BMI) 31.0-31.9, adult: Secondary | ICD-10-CM | POA: Diagnosis not present

## 2021-06-26 DIAGNOSIS — R1013 Epigastric pain: Secondary | ICD-10-CM

## 2021-06-26 DIAGNOSIS — I10 Essential (primary) hypertension: Secondary | ICD-10-CM | POA: Insufficient documentation

## 2021-06-26 DIAGNOSIS — E669 Obesity, unspecified: Secondary | ICD-10-CM | POA: Diagnosis not present

## 2021-06-26 DIAGNOSIS — Z79899 Other long term (current) drug therapy: Secondary | ICD-10-CM | POA: Diagnosis not present

## 2021-06-26 DIAGNOSIS — K449 Diaphragmatic hernia without obstruction or gangrene: Secondary | ICD-10-CM | POA: Insufficient documentation

## 2021-06-26 DIAGNOSIS — K219 Gastro-esophageal reflux disease without esophagitis: Secondary | ICD-10-CM | POA: Insufficient documentation

## 2021-06-26 DIAGNOSIS — R634 Abnormal weight loss: Secondary | ICD-10-CM

## 2021-06-26 DIAGNOSIS — M797 Fibromyalgia: Secondary | ICD-10-CM | POA: Diagnosis not present

## 2021-06-26 HISTORY — PX: ESOPHAGOGASTRODUODENOSCOPY (EGD) WITH PROPOFOL: SHX5813

## 2021-06-26 HISTORY — PX: BIOPSY: SHX5522

## 2021-06-26 SURGERY — ESOPHAGOGASTRODUODENOSCOPY (EGD) WITH PROPOFOL
Anesthesia: General | Site: Esophagus

## 2021-06-26 MED ORDER — GLYCOPYRROLATE 0.2 MG/ML IJ SOLN
INTRAMUSCULAR | Status: DC | PRN
  Administered 2021-06-26: .1 mg via INTRAVENOUS

## 2021-06-26 MED ORDER — LIDOCAINE HCL (CARDIAC) PF 100 MG/5ML IV SOSY
PREFILLED_SYRINGE | INTRAVENOUS | Status: DC | PRN
  Administered 2021-06-26: 30 mg via INTRAVENOUS

## 2021-06-26 MED ORDER — PROPOFOL 10 MG/ML IV BOLUS
INTRAVENOUS | Status: DC | PRN
Start: 2021-06-26 — End: 2021-06-26
  Administered 2021-06-26: 120 mg via INTRAVENOUS

## 2021-06-26 MED ORDER — SODIUM CHLORIDE 0.9 % IV SOLN: INTRAVENOUS | Status: DC

## 2021-06-26 MED ORDER — LACTATED RINGERS IV SOLN: INTRAVENOUS | Status: DC

## 2021-06-26 MED ORDER — ONDANSETRON HCL 4 MG/2ML IJ SOLN: 4.0000 mg | Freq: Once | INTRAMUSCULAR | Status: DC | PRN

## 2021-06-26 SURGICAL SUPPLY — 34 items
BALLN DILATOR 10-12 8 (BALLOONS)
BALLN DILATOR 12-15 8 (BALLOONS)
BALLN DILATOR 15-18 8 (BALLOONS)
BALLN DILATOR CRE 0-12 8 (BALLOONS)
BALLN DILATOR ESOPH 8 10 CRE (MISCELLANEOUS) IMPLANT
BALLOON DILATOR 12-15 8 (BALLOONS) IMPLANT
BALLOON DILATOR 15-18 8 (BALLOONS) IMPLANT
BALLOON DILATOR CRE 0-12 8 (BALLOONS) IMPLANT
BLOCK BITE 60FR ADLT L/F GRN (MISCELLANEOUS) ×2 IMPLANT
CLIP HMST 235XBRD CATH ROT (MISCELLANEOUS) IMPLANT
CLIP RESOLUTION 360 11X235 (MISCELLANEOUS)
ELECT REM PT RETURN 9FT ADLT (ELECTROSURGICAL)
ELECTRODE REM PT RTRN 9FT ADLT (ELECTROSURGICAL) IMPLANT
FCP ESCP3.2XJMB 240X2.8X (MISCELLANEOUS)
FORCEPS BIOP RAD 4 LRG CAP 4 (CUTTING FORCEPS) ×1 IMPLANT
FORCEPS BIOP RJ4 240 W/NDL (MISCELLANEOUS)
FORCEPS ESCP3.2XJMB 240X2.8X (MISCELLANEOUS) IMPLANT
GOWN CVR UNV OPN BCK APRN NK (MISCELLANEOUS) ×2 IMPLANT
GOWN ISOL THUMB LOOP REG UNIV (MISCELLANEOUS) ×4
INJECTOR VARIJECT VIN23 (MISCELLANEOUS) IMPLANT
KIT DEFENDO VALVE AND CONN (KITS) IMPLANT
KIT PRC NS LF DISP ENDO (KITS) ×1 IMPLANT
KIT PROCEDURE OLYMPUS (KITS) ×2
MANIFOLD NEPTUNE II (INSTRUMENTS) ×2 IMPLANT
MARKER SPOT ENDO TATTOO 5ML (MISCELLANEOUS) IMPLANT
RETRIEVER NET PLAT FOOD (MISCELLANEOUS) IMPLANT
SNARE SHORT THROW 13M SML OVAL (MISCELLANEOUS) IMPLANT
SNARE SHORT THROW 30M LRG OVAL (MISCELLANEOUS) IMPLANT
SPOT EX ENDOSCOPIC TATTOO (MISCELLANEOUS)
SYR INFLATION 60ML (SYRINGE) IMPLANT
TRAP ETRAP POLY (MISCELLANEOUS) IMPLANT
VARIJECT INJECTOR VIN23 (MISCELLANEOUS)
WATER STERILE IRR 250ML POUR (IV SOLUTION) ×2 IMPLANT
WIRE CRE 18-20MM 8CM F G (MISCELLANEOUS) IMPLANT

## 2021-06-26 NOTE — Anesthesia Preprocedure Evaluation (Signed)
Anesthesia Evaluation  Patient identified by MRN, date of birth, ID band Patient awake    Reviewed: Allergy & Precautions, NPO status , Patient's Chart, lab work & pertinent test results, reviewed documented beta blocker date and time   History of Anesthesia Complications Negative for: history of anesthetic complications  Airway Mallampati: I  TM Distance: >3 FB Neck ROM: Limited    Dental   Pulmonary sleep apnea ,    breath sounds clear to auscultation       Cardiovascular Exercise Tolerance: Poor hypertension, (-) angina(-) DOE  Rhythm:Regular Rate:Normal     Neuro/Psych  Headaches, PSYCHIATRIC DISORDERS Anxiety  Fibromyalgia  Neuromuscular disease (Lumbar radiculopathy)    GI/Hepatic GERD  ,  Endo/Other    Renal/GU CRFRenal disease     Musculoskeletal  (+) Arthritis , Fibromyalgia - Sjogren's   Abdominal (+) + obese (BMI 31),   Peds  Hematology  (+) Blood dyscrasia, anemia ,  H/o DVT in 20s   Anesthesia Other Findings   Reproductive/Obstetrics                             Anesthesia Physical Anesthesia Plan  ASA: 2  Anesthesia Plan: General   Post-op Pain Management:    Induction: Intravenous  PONV Risk Score and Plan: 3 and Propofol infusion, TIVA and Treatment may vary due to age or medical condition  Airway Management Planned: Natural Airway and Nasal Cannula  Additional Equipment:   Intra-op Plan:   Post-operative Plan:   Informed Consent: I have reviewed the patients History and Physical, chart, labs and discussed the procedure including the risks, benefits and alternatives for the proposed anesthesia with the patient or authorized representative who has indicated his/her understanding and acceptance.       Plan Discussed with: CRNA and Anesthesiologist  Anesthesia Plan Comments:         Anesthesia Quick Evaluation

## 2021-06-26 NOTE — Anesthesia Postprocedure Evaluation (Signed)
Anesthesia Post Note  Patient: Engineer, manufacturing  Procedure(s) Performed: ESOPHAGOGASTRODUODENOSCOPY (EGD) WITH PROPOFOL (Esophagus) BIOPSY (Esophagus)     Patient location during evaluation: PACU Anesthesia Type: General Level of consciousness: awake and alert Pain management: pain level controlled Vital Signs Assessment: post-procedure vital signs reviewed and stable Respiratory status: spontaneous breathing, nonlabored ventilation, respiratory function stable and patient connected to nasal cannula oxygen Cardiovascular status: blood pressure returned to baseline and stable Postop Assessment: no apparent nausea or vomiting Anesthetic complications: no   No notable events documented.  Tallin Hart A  Brayant Dorr

## 2021-06-26 NOTE — Anesthesia Procedure Notes (Signed)
Date/Time: 06/26/2021 10:40 AM Performed by: Maree Krabbe, CRNA Pre-anesthesia Checklist: Patient identified, Emergency Drugs available, Suction available, Timeout performed and Patient being monitored Patient Re-evaluated:Patient Re-evaluated prior to induction Oxygen Delivery Method: Nasal cannula Placement Confirmation: positive ETCO2

## 2021-06-26 NOTE — Interval H&P Note (Signed)
Amy Lame, MD [address] [place], [place] [address] Phone:[phone] Fax : [phone]  Primary Care Physician:  Gladstone Lighter, MD Primary Gastroenterologist:  Dr. Allen Norris  Pre-Procedure History & Physical: HPI:  Amy Huynh is a 66 y.o. female is here for an endoscopy.   Past Medical History:  Diagnosis Date   Arthritis    Family history of ovarian cancer 12/2016   MyRisk neg   Fibromyalgia    Migraine    Neuropathy    Placenta previa    Sjogren's disease ([place])     Past Surgical History:  Procedure Laterality Date   CERVICAL FUSION     CESAREAN SECTION     CHOLECYSTECTOMY     HERNIA REPAIR     REPLACEMENT TOTAL KNEE Left    SHOULDER SURGERY Right     Prior to Admission medications   Medication Sig Start Date End Date Taking? Authorizing Provider  aspirin-acetaminophen-caffeine (EXCEDRIN MIGRAINE) [phone] MG tablet Take by mouth.   Yes [provider]  butalbital-acetaminophen-caffeine (FIORICET, ESGIC) 50-325-40 MG tablet Take 1 tablet by mouth every 6 (six) hours as needed for headache.   Yes [provider]  cetirizine (ZYRTEC) 10 MG tablet Take 10 mg by mouth daily.   Yes [provider]  clobetasol cream (TEMOVATE) 0.05 % Apply topically. 09/07/17  Yes [provider]  cyanocobalamin 1000 MCG tablet Take 1,000 mcg by mouth daily.   Yes [provider]  fexofenadine (ALLEGRA) 180 MG tablet Take 180 mg by mouth daily.   Yes [provider]  folic acid (FOLVITE) 1 MG tablet Take 1 mg by mouth daily. 08/16/19  Yes [provider]  gabapentin (NEURONTIN) 300 MG capsule Take 900 mg by mouth at bedtime.   Yes [provider]  hydrOXYzine (ATARAX/VISTARIL) 10 MG tablet Take 10 mg by mouth every 8 (eight) hours as needed.   Yes [provider]  hyoscyamine (LEVSIN, ANASPAZ) 0.125 MG tablet Take 0.125 mg by mouth every 4 (four) hours as needed.   Yes  [provider]  methocarbamol (ROBAXIN) 500 MG tablet Take 500 mg by mouth at bedtime.  08/22/17  Yes [provider]  methotrexate (RHEUMATREX) 2.5 MG tablet Take 25 mg by mouth once a week. Take 5 tablets by mouth in the morning and 5 tablets by mouth in the evening on Saturdays 02/08/18  Yes [provider]  pantoprazole (PROTONIX) 40 MG tablet Take 1 tablet (40 mg total) by mouth 2 (two) times daily. 01/28/21 06/26/21 Yes Amy Lame, MD  pilocarpine (SALAGEN) 5 MG tablet Take 5 mg by mouth 3 (three) times daily.   Yes [provider]  promethazine (PHENERGAN) 25 MG tablet Take 25 mg by mouth every 6 (six) hours as needed for nausea or vomiting.   Yes [provider]  rizatriptan (MAXALT) 10 MG tablet Take 10 mg by mouth as needed for migraine. May repeat in 2 hours if needed   Yes [provider]  rOPINIRole (REQUIP) 0.5 MG tablet Take 1 tablet by mouth at bedtime.  07/26/19 06/26/21 Yes [provider]  topiramate (TOPAMAX) 100 MG tablet Take 200 mg by mouth daily.   Yes [provider]  valACYclovir (VALTREX) 1000 MG tablet Take 1 tablet once a day by mouth 07/14/16  Yes [provider]  VITAMIN D, CHOLECALCIFEROL, PO Take by mouth.   Yes [provider]    Allergies as of 06/18/2021 - Review Complete 04/23/2021  Allergen Reaction  Noted   Hydrocodone-acetaminophen  09/24/2015   Hydrocodone-acetaminophen  09/24/2015   Morphine Itching and Other (See Comments) 10/03/2012   Morphine and related Itching 10/03/2012   Cymbalta [duloxetine hcl] Other (See Comments) 12/29/2016   Duloxetine Other (See Comments) 07/23/2015   Pregabalin Other (See Comments) 09/24/2015    Family History  Problem Relation Age of Onset   Ovarian cancer Maternal Aunt 30   Uterine cancer Mother        73s   COPD Mother    Hypertension Mother    Uterine cancer Maternal Grandmother        ? age   Heart attack Father 60   Other  Father        DDD    Social History   Socioeconomic History   Marital status: Married    Spouse name: Not on file   Number of children: Not on file   Years of education: Not on file   Highest education level: Not on file  Occupational History   Not on file  Tobacco Use   Smoking status: Never   Smokeless tobacco: Never  Vaping Use   Vaping Use: Never used  Substance and Sexual Activity   Alcohol use: Not Currently   Drug use: Never   Sexual activity: Not Currently    Birth control/protection: Post-menopausal  Other Topics Concern   Not on file  Social History Narrative   Not on file   Social Determinants of Health   Financial Resource Strain: Not on file  Food Insecurity: Not on file  Transportation Needs: Not on file  Physical Activity: Not on file  Stress: Not on file  Social Connections: Not on file  Intimate Partner Violence: Not on file    Review of Systems: See HPI, otherwise negative ROS  Physical Exam: BP 130/74    Pulse 66    Temp 98.1 F (36.7 C) (Temporal)    Resp 14    Ht 5' 2.5" (1.588 m)    Wt 78 kg    SpO2 99%    BMI 30.96 kg/m  General:   Alert,  pleasant and cooperative in NAD Head:  Normocephalic and atraumatic. Neck:  Supple; no masses or thyromegaly. Lungs:  Clear throughout to auscultation.    Heart:  Regular rate and rhythm. Abdomen:  Soft, nontender and nondistended. Normal bowel sounds, without guarding, and without rebound.   Neurologic:  Alert and  oriented x4;  grossly normal neurologically.  Impression/Plan: [name] is here for an endoscopy to be performed for GERD dysphagia  Risks, benefits, limitations, and alternatives regarding  endoscopy have been reviewed with the patient.  Questions have been answered.  All parties agreeable.   Amy Lame, MD  06/26/2021, 10:27 AM

## 2021-06-26 NOTE — Transfer of Care (Signed)
Immediate Anesthesia Transfer of Care Note  Patient: Amy Huynh  Procedure(s) Performed: ESOPHAGOGASTRODUODENOSCOPY (EGD) WITH PROPOFOL (Esophagus) BIOPSY (Esophagus)  Patient Location: PACU  Anesthesia Type: General  Level of Consciousness: awake, alert  and patient cooperative  Airway and Oxygen Therapy: Patient Spontanous Breathing and Patient connected to supplemental oxygen  Post-op Assessment: Post-op Vital signs reviewed, Patient's Cardiovascular Status Stable, Respiratory Function Stable, Patent Airway and No signs of Nausea or vomiting  Post-op Vital Signs: Reviewed and stable  Complications: No notable events documented.

## 2021-06-26 NOTE — Op Note (Signed)
[facility] Patient Name: Amy Huynh Procedure Date: 06/26/2021 10:27 AM MRN: [id number] Account #: [id number] Date of Birth: 20-Dec-1955 Admit Type: Outpatient Age: 66 Room: [facility] OR ROOM 01 Gender: Female Note Status: Finalized Instrument Name: [id number] Procedure:             Upper GI endoscopy Indications:           Heartburn Providers:             Lucilla Lame MD, MD Referring MD:          Gladstone Lighter, MD (Referring MD) Medicines:             Propofol per Anesthesia Complications:         No immediate complications. Procedure:             Pre-Anesthesia Assessment:                        - Prior to the procedure, a History and Physical was                         performed, and patient medications and allergies were                         reviewed. The patient's tolerance of previous                         anesthesia was also reviewed. The risks and benefits                         of the procedure and the sedation options and risks                         were discussed with the patient. All questions were                         answered, and informed consent was obtained. Prior                         Anticoagulants: The patient has taken no previous                         anticoagulant or antiplatelet agents. ASA Grade                         Assessment: II - A patient with mild systemic disease.                         After reviewing the risks and benefits, the patient                         was deemed in satisfactory condition to undergo the                         procedure.                        After obtaining informed consent, the endoscope was  passed under direct vision. Throughout the procedure,                         the patient's blood pressure, pulse, and oxygen                         saturations were monitored continuously. The was                         introduced through the mouth, and  advanced to the                         second part of duodenum. The upper GI endoscopy was                         accomplished without difficulty. The patient tolerated                         the procedure well. Findings:      The Z-line was irregular and was found at the gastroesophageal junction.      A small hiatal hernia was present.      Two non-bleeding cratered gastric ulcers with no stigmata of bleeding       were found in the gastric antrum. Biopsies were taken with a cold       forceps for histology.      One non-bleeding superficial gastric ulcer with no stigmata of bleeding       was found at the pylorus. The lesion was around the entire pylorus mm in       largest dimension.      The examined duodenum was normal. Impression:            - Z-line irregular, at the gastroesophageal junction.                        - Small hiatal hernia.                        - Non-bleeding gastric ulcers with no stigmata of                         bleeding. Biopsied.                        - Non-bleeding gastric ulcer with no stigmata of                         bleeding.                        - Normal examined duodenum. Recommendation:        - Discharge patient to home.                        - Resume previous diet.                        - Continue present medications.                        - Await pathology results.                        -  No ibuprofen, naproxen, or other non-steroidal                         anti-inflammatory drugs. Procedure Code(s):     --- Professional ---                        [phone], Esophagogastroduodenoscopy, flexible,                         transoral; with biopsy, single or multiple Diagnosis Code(s):     --- Professional ---                        R12, Heartburn                        K25.9, Gastric ulcer, unspecified as acute or chronic,                         without hemorrhage or perforation CPT copyright 2019 American Medical Association. All  rights reserved. The codes documented in this report are preliminary and upon coder review may  be revised to meet current compliance requirements. Lucilla Lame MD, MD 06/26/2021 10:46:06 AM This report has been signed electronically. Number of Addenda: 0 Note Initiated On: 06/26/2021 10:27 AM Total Procedure Duration: 0 hours 2 minutes 22 seconds  Estimated Blood Loss:  Estimated blood loss: none.      [facility]

## 2021-06-30 LAB — SURGICAL PATHOLOGY

## 2021-07-01 ENCOUNTER — Encounter: Payer: Self-pay | Admitting: Gastroenterology

## 2021-07-23 ENCOUNTER — Other Ambulatory Visit
Admission: RE | Admit: 2021-07-23 | Discharge: 2021-07-23 | Disposition: A | Discharge status: Home / Self Care | Payer: Medicare PPO | Attending: Gastroenterology | Admitting: Gastroenterology

## 2021-07-23 ENCOUNTER — Other Ambulatory Visit: Payer: Self-pay

## 2021-07-23 ENCOUNTER — Ambulatory Visit: Payer: Medicare PPO | Admitting: Gastroenterology

## 2021-07-23 ENCOUNTER — Encounter: Payer: Self-pay | Admitting: Gastroenterology

## 2021-07-23 VITALS — BP 122/78 | HR 67 | Temp 98.1°F | Wt 169.0 lb

## 2021-07-23 DIAGNOSIS — K253 Acute gastric ulcer without hemorrhage or perforation: Secondary | ICD-10-CM | POA: Diagnosis present

## 2021-07-23 MED ORDER — DEXLANSOPRAZOLE 60 MG PO CPDR: 60.0000 mg | DELAYED_RELEASE_CAPSULE | Freq: Every day | ORAL | 5 refills | Status: DC

## 2021-07-23 NOTE — Progress Notes (Signed)
Patient has tried Pantoprazole, Nexium and Omeprazole ?

## 2021-07-23 NOTE — Progress Notes (Signed)
? ? ?Primary Care Physician: Gladstone Lighter, MD ? ?Primary Gastroenterologist:  Dr. Lucilla Lame ? ?Chief Complaint  ?Patient presents with  ? Follow-up  ?  Discuss pH study  ? ? ?HPI: Amy Huynh is a 66 y.o. female here for follow-up after having continued heartburn despite taking multiple PPIs to the past.  The patient had an upper endoscopy with some gastric erosions and a pyloric ulcer. The patient reported previously that she had mouth ulcers that have resolved.  The patient continues to insist that she does not take any NSAIDs. ? ?Past Medical History:  ?Diagnosis Date  ? Arthritis   ? Family history of ovarian cancer 12/2016  ? MyRisk neg  ? Fibromyalgia   ? Migraine   ? Neuropathy   ? Placenta previa   ? Sjogren's disease ([place])   ? ? ?Current Outpatient Medications  ?Medication Sig Dispense Refill  ? dexlansoprazole (DEXILANT) 60 MG capsule Take 1 capsule (60 mg total) by mouth daily. 30 capsule 5  ? aspirin-acetaminophen-caffeine (EXCEDRIN MIGRAINE) 250-250-65 MG tablet Take by mouth.    ? butalbital-acetaminophen-caffeine (FIORICET, ESGIC) 50-325-40 MG tablet Take 1 tablet by mouth every 6 (six) hours as needed for headache.    ? cetirizine (ZYRTEC) 10 MG tablet Take 10 mg by mouth daily.    ? clobetasol cream (TEMOVATE) 0.05 % Apply topically.    ? cyanocobalamin 1000 MCG tablet Take 1,000 mcg by mouth daily.    ? fexofenadine (ALLEGRA) 180 MG tablet Take 180 mg by mouth daily.    ? folic acid (FOLVITE) 1 MG tablet Take 1 mg by mouth daily.    ? gabapentin (NEURONTIN) 300 MG capsule Take 900 mg by mouth at bedtime.    ? hydrOXYzine (ATARAX/VISTARIL) 10 MG tablet Take 10 mg by mouth every 8 (eight) hours as needed.    ? hyoscyamine (LEVSIN, ANASPAZ) 0.125 MG tablet Take 0.125 mg by mouth every 4 (four) hours as needed.    ? methocarbamol (ROBAXIN) 500 MG tablet Take 500 mg by mouth at bedtime.     ? methotrexate (RHEUMATREX) 2.5 MG tablet Take 25 mg by mouth once a week. Take 5 tablets by  mouth in the morning and 5 tablets by mouth in the evening on Saturdays    ? pantoprazole (PROTONIX) 40 MG tablet Take 1 tablet (40 mg total) by mouth 2 (two) times daily. 180 tablet 1  ? pilocarpine (SALAGEN) 5 MG tablet Take 5 mg by mouth 3 (three) times daily.    ? promethazine (PHENERGAN) 25 MG tablet Take 25 mg by mouth every 6 (six) hours as needed for nausea or vomiting.    ? rizatriptan (MAXALT) 10 MG tablet Take 10 mg by mouth as needed for migraine. May repeat in 2 hours if needed    ? rOPINIRole (REQUIP) 0.5 MG tablet Take 1 tablet by mouth at bedtime.     ? topiramate (TOPAMAX) 100 MG tablet Take 200 mg by mouth daily.    ? valACYclovir (VALTREX) 1000 MG tablet Take 1 tablet once a day by mouth    ? VITAMIN D, CHOLECALCIFEROL, PO Take by mouth.    ? ?No current facility-administered medications for this visit.  ? ? ?Allergies as of 07/23/2021 - Review Complete 06/26/2021  ?Allergen Reaction Noted  ? Hydrocodone-acetaminophen  09/24/2015  ? Hydrocodone-acetaminophen  09/24/2015  ? Morphine Itching and Other (See Comments) 10/03/2012  ? Morphine and related Itching 10/03/2012  ? Cymbalta [duloxetine hcl] Other (See Comments) 12/29/2016  ?  Duloxetine Other (See Comments) 07/23/2015  ? Pregabalin Other (See Comments) 09/24/2015  ? ? ?ROS: ? ?General: Negative for anorexia, weight loss, fever, chills, fatigue, weakness. ?ENT: Negative for hoarseness, difficulty swallowing , nasal congestion. ?CV: Negative for chest pain, angina, palpitations, dyspnea on exertion, peripheral edema.  ?Respiratory: Negative for dyspnea at rest, dyspnea on exertion, cough, sputum, wheezing.  ?GI: See history of present illness. ?GU:  Negative for dysuria, hematuria, urinary incontinence, urinary frequency, nocturnal urination.  ?Endo: Negative for unusual weight change.  ?  ?Physical Examination: ? ? BP 122/78   Pulse 67   Temp 98.1 ?F (36.7 ?C) (Oral)   Wt 169 lb (76.7 kg)   BMI 30.42 kg/m?  ? ?General: Well-nourished,  well-developed in no acute distress.  ?Eyes: No icterus. Conjunctivae pink. ?Extremities: No lower extremity edema. No clubbing or deformities. ?Neuro: Alert and oriented x 3.  Grossly intact. ?Skin: Warm and dry, no jaundice.   ?Psych: Alert and cooperative, normal mood and affect. ? ?Labs:  ?  ?Imaging Studies: ?No results found. ? ?Assessment and Plan:  ? ?Amy Huynh is a 66 y.o. y/o female Who comes in today for follow-up after having an upper endoscopy with a finding of a pyloric channel ulcer and multiple gastric erosions with an irregular Z line.  The patient continues to have acid reflux symptoms.  The patient will be switched to [place] since she has tried pantoprazole and Nexium in addition to other PPIs.  If this does not resolve her symptoms then the patient has been told that she may need a 24-hour pH probe to see why she continues to have heartburn.  The patient will also have her blood centile for a gastrin level to make sure that she does not have hypergastrinemia area the patient has been explained the plan and agrees ? ? ? ? ?Lucilla Lame, MD. Marval Regal ? ? ? Note: This dictation was prepared with Dragon dictation along with smaller phrase technology. Any transcriptional errors that result from this process are unintentional.  ?

## 2021-07-27 LAB — GASTRIN: Gastrin: 123 pg/mL — ABNORMAL HIGH (ref 0–115)

## 2021-08-27 ENCOUNTER — Other Ambulatory Visit: Payer: Self-pay | Admitting: Internal Medicine

## 2021-08-27 DIAGNOSIS — M3500 Sicca syndrome, unspecified: Secondary | ICD-10-CM

## 2021-08-27 DIAGNOSIS — R202 Paresthesia of skin: Secondary | ICD-10-CM

## 2021-08-27 DIAGNOSIS — G43009 Migraine without aura, not intractable, without status migrainosus: Secondary | ICD-10-CM

## 2021-08-27 DIAGNOSIS — M5416 Radiculopathy, lumbar region: Secondary | ICD-10-CM

## 2021-09-03 ENCOUNTER — Other Ambulatory Visit: Payer: Self-pay | Admitting: Orthopedic Surgery

## 2021-09-03 DIAGNOSIS — M25511 Pain in right shoulder: Secondary | ICD-10-CM

## 2021-09-05 ENCOUNTER — Ambulatory Visit
Admission: RE | Admit: 2021-09-05 | Discharge: 2021-09-05 | Disposition: A | Discharge status: Home / Self Care | Payer: Medicare PPO | Source: Ambulatory Visit | Attending: Internal Medicine | Admitting: Internal Medicine

## 2021-09-05 DIAGNOSIS — M3500 Sicca syndrome, unspecified: Secondary | ICD-10-CM | POA: Diagnosis present

## 2021-09-05 DIAGNOSIS — M25511 Pain in right shoulder: Secondary | ICD-10-CM

## 2021-09-05 DIAGNOSIS — R202 Paresthesia of skin: Secondary | ICD-10-CM | POA: Diagnosis present

## 2021-09-05 DIAGNOSIS — G43009 Migraine without aura, not intractable, without status migrainosus: Secondary | ICD-10-CM | POA: Diagnosis present

## 2021-09-05 DIAGNOSIS — M5416 Radiculopathy, lumbar region: Secondary | ICD-10-CM | POA: Diagnosis present

## 2021-09-05 IMAGING — MR MR HEAD WO/W CM
15 series · 48 of 48 positions shown · IV contrast (7.5ml Gadavist)
Comparison: Head CT [DATE]

CLINICAL DATA: Extremity weakness with paresthesia, mainly in legs.
Progressive voice weakness.

EXAM:
MRI HEAD WITHOUT AND WITH CONTRAST
TECHNIQUE: Multiplanar, multiecho pulse sequences of the brain and surrounding
structures were obtained without and with intravenous contrast.
CONTRAST:  7.5mL GADAVIST GADOBUTROL 1 MMOL/ML IV SOLN

[Series 5: ax dwi_tracew · axial · 3.0mm · 0.65mm/px · z∈[-121,+32]mm · 4 of 48 slices shown]
[im 1/48]
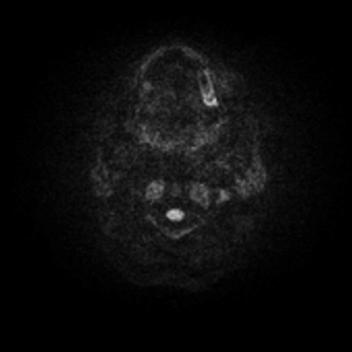
[im 16/48]
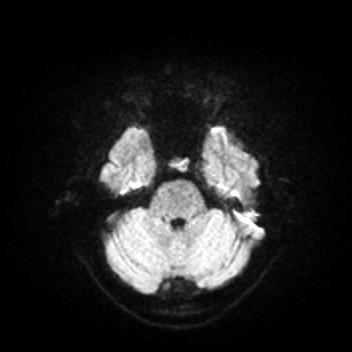
[im 32/48]
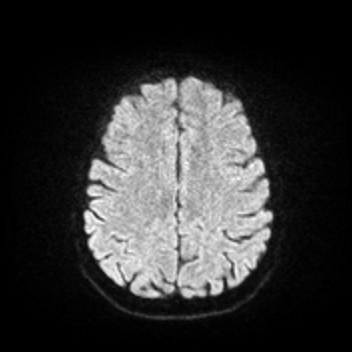
[im 48/48]
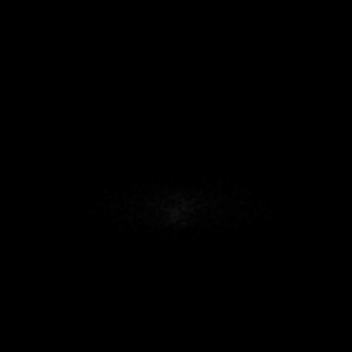

[Series 6: ax dwi_adc · axial · 3.0mm · 0.65mm/px · z∈[-121,+29]mm · 3 of 47 slices shown]
[im 1/47]
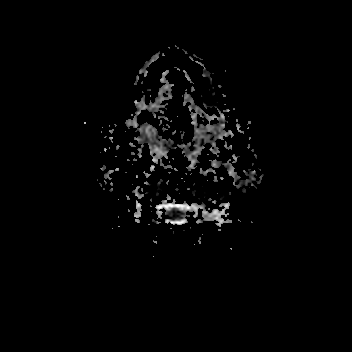
[im 24/47]
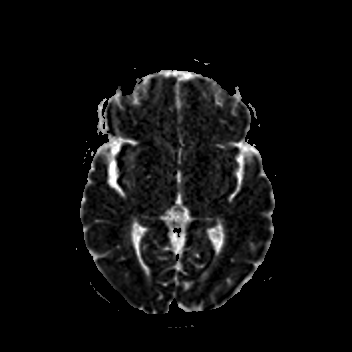
[im 47/47]
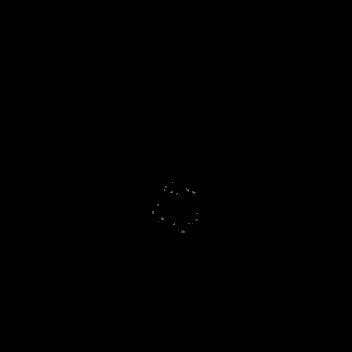

[Series 7: cor dwi_tracew · coronal · 5.0mm · 0.60mm/px · 2 of 36 slices shown]
[im 1/36]
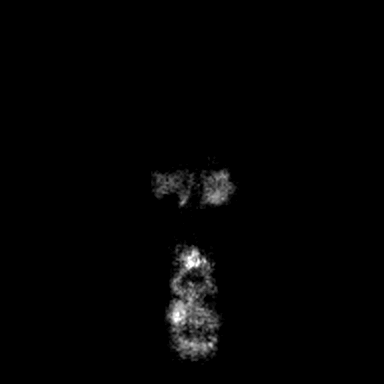
[im 36/36]
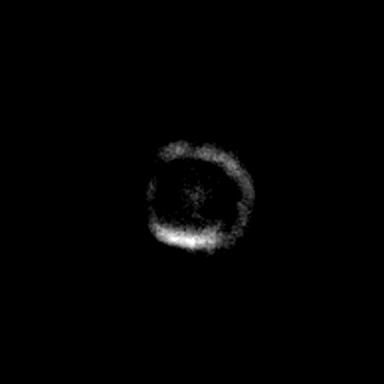

[Series 8: cor dwi_adc · coronal · 5.0mm · 0.60mm/px · 2 of 36 slices shown]
[im 1/36]
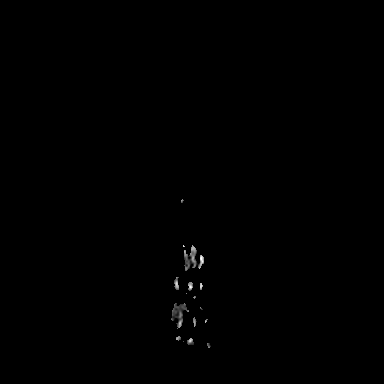
[im 36/36]
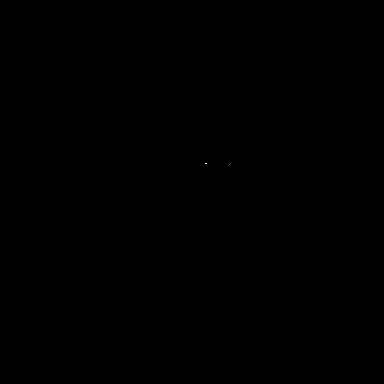

[Series 9: T1 · sagittal · 5.0mm · 0.62mm/px · 1 of 21 slices shown (1 of 2)]
[im 1/21]
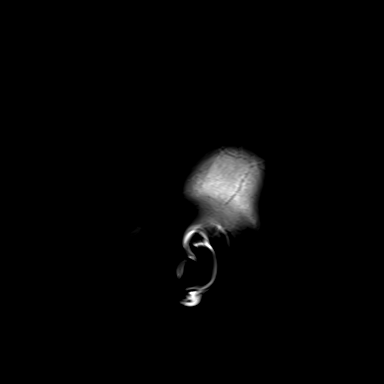

[Series 10: FLAIR · sagittal · 5.0mm · 0.94mm/px · 1 of 21 slices shown (1 of 2)]
[im 1/21]
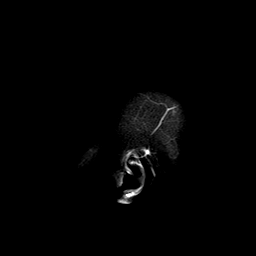

[Series 11: T2 · axial · 5.0mm · 0.53mm/px · 1 of 25 slices shown]
[im 1/25]
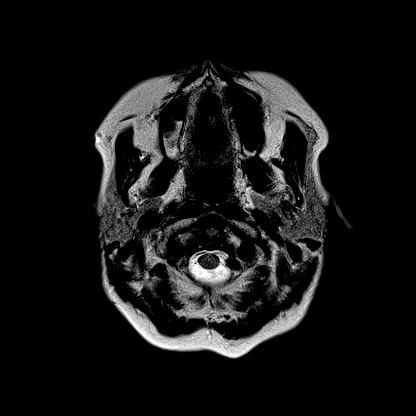

[Series 13: ax swi_pha · axial · 2.0mm · 0.90mm/px · z∈[-105,+36]mm · 4 of 72 slices shown]
[im 1/72]
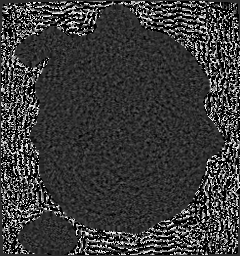
[im 24/72]
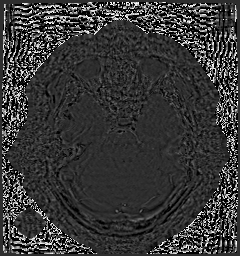
[im 48/72]
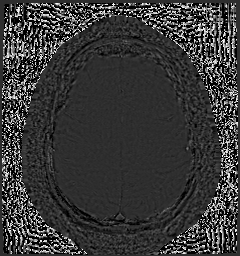
[im 72/72]
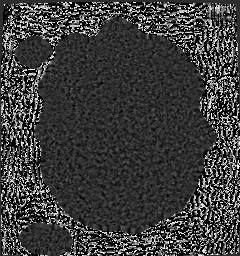

[Series 14: ax swi_swi · axial · 2.0mm · 0.90mm/px · z∈[-105,+36]mm · 4 of 72 slices shown]
[im 1/72]
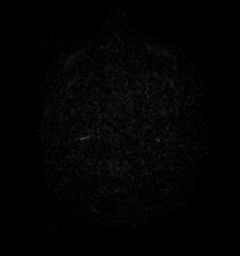
[im 24/72]
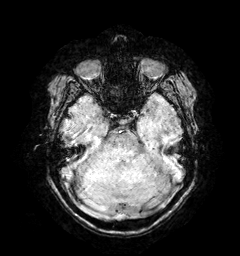
[im 48/72]
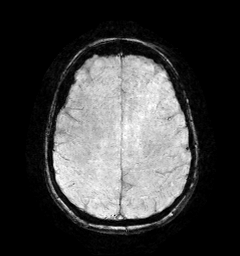
[im 72/72]
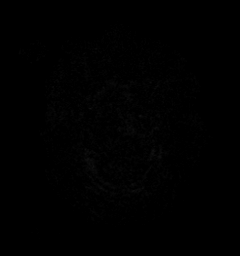

[Series 16: FLAIR · axial · 3.0mm · 0.53mm/px · z∈[-107,+39]mm · 3 of 50 slices shown (2 of 2)]
[im 1/50]
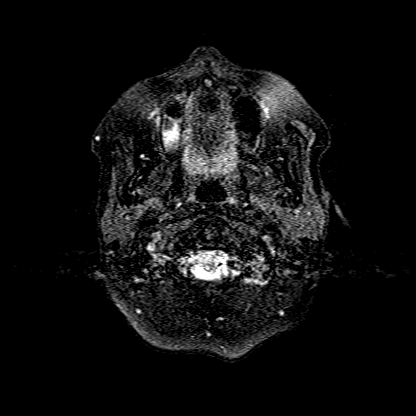
[im 25/50]
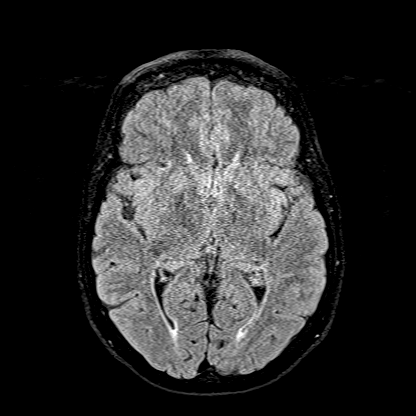
[im 50/50]
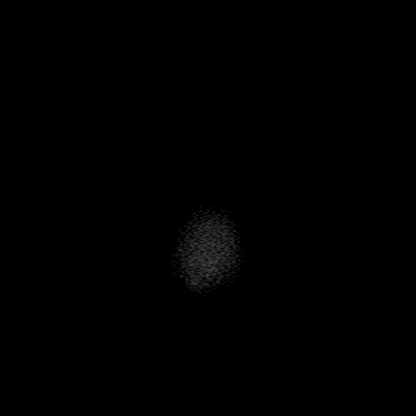

[Series 17: T1 · axial · 1.0mm · 0.98mm/px · z∈[-115,+43]mm · 9 of 160 slices shown (2 of 2)]
[im 1/160]
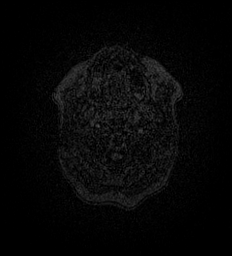
[im 20/160]
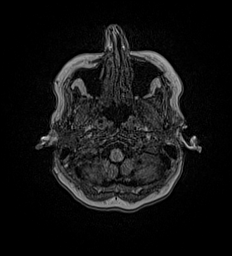
[im 40/160]
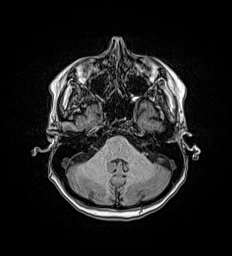
[im 60/160]
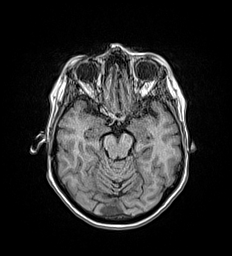
[im 80/160]
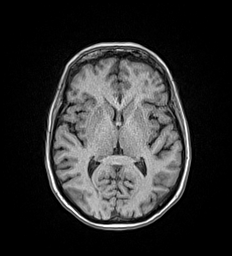
[im 100/160]
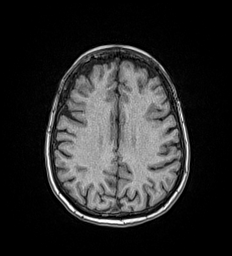
[im 120/160]
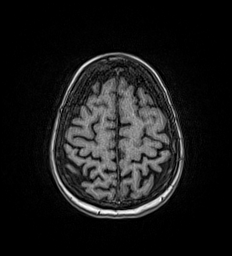
[im 140/160]
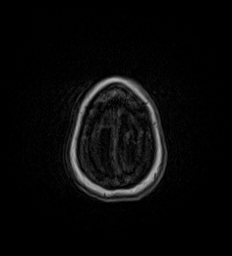
[im 160/160]
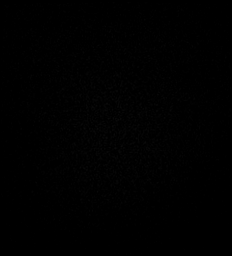

[Series 22: T2 post-contrast · coronal · 5.0mm · 0.57mm/px · 2 of 28 slices shown]
[im 1/28]
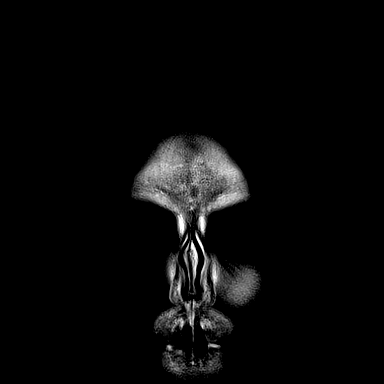
[im 28/28]
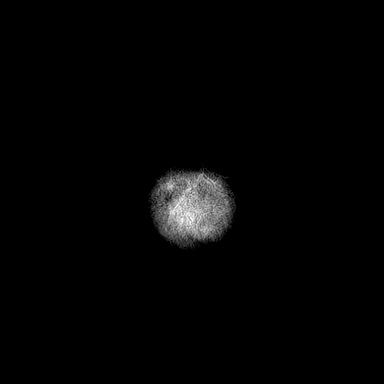

[Series 23: T1 post-contrast · axial · 1.0mm · 0.98mm/px · z∈[-81,+78]mm · 9 of 159 slices shown (1 of 3)]
[im 1/159]
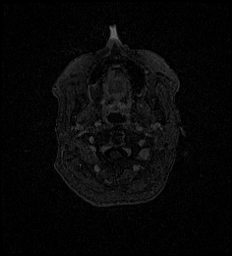
[im 20/159]
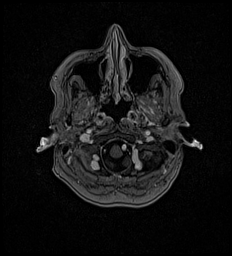
[im 40/159]
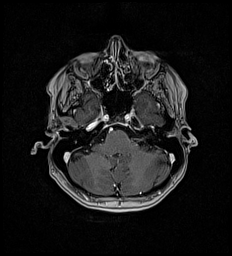
[im 60/159]
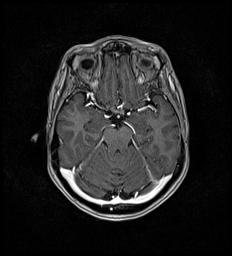
[im 80/159]
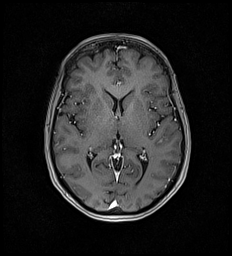
[im 99/159]
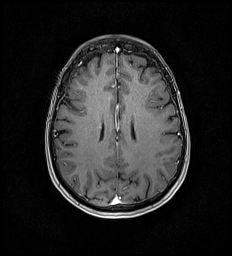
[im 119/159]
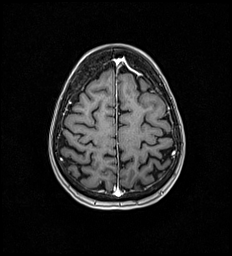
[im 139/159]
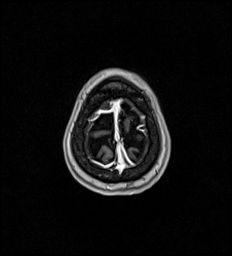
[im 159/159]
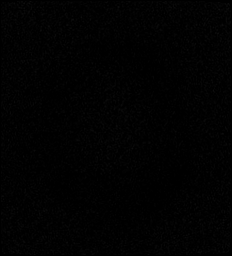

[Series 24: T1 post-contrast · coronal · 5.0mm · 0.57mm/px · 2 of 28 slices shown (2 of 3)]
[im 1/28]
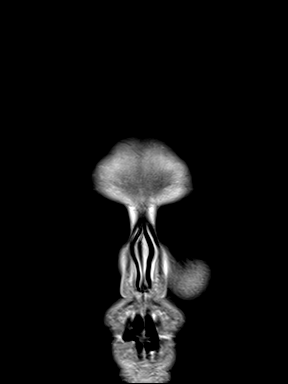
[im 28/28]
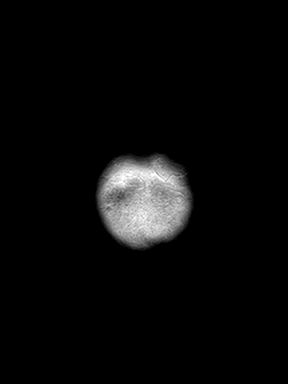

[Series 25: T1 post-contrast · sagittal · 5.0mm · 0.62mm/px · 1 of 21 slices shown (3 of 3)]
[im 1/21]
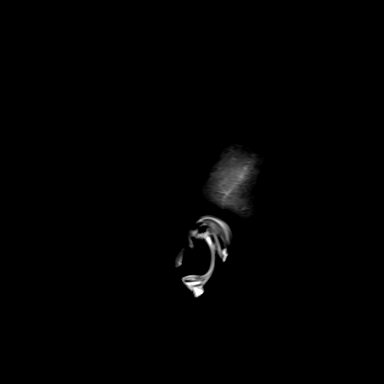

[48 of 48 positions shown; findings below may reference images not displayed]

FINDINGS: Brain: No gray matter infarction, hemorrhage, hydrocephalus,
extra-axial collection or mass lesion. No white matter disease,
atrophy, or abnormal mineralization

Vascular: Normal flow void

Skull and upper cervical spine: Nodes normal marrow signal

Sinuses/Orbits: Negative
IMPRESSION: Normal brain MRI.

## 2021-09-05 IMAGING — MR MR SHOULDER*R* W/O CM
4 of 5 series · 30 of 40 positions shown · non-contrast
Comparison: None.

CLINICAL DATA: Right shoulder pain

EXAM:
MRI OF THE RIGHT SHOULDER WITHOUT CONTRAST
TECHNIQUE: Multiplanar, multisequence MR imaging of the shoulder was performed.
No intravenous contrast was administered.

[Series 5: T2 fat-sat · axial · right · 4.0mm · 0.44mm/px · z∈[-67,+40]mm · 8 of 26 slices shown (1 of 3)]
[im 1/26]
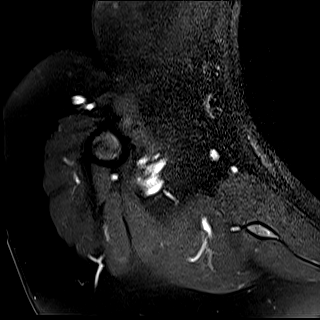
[im 4/26]
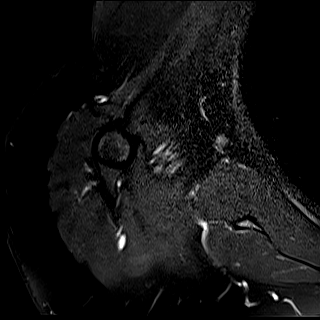
[im 8/26]
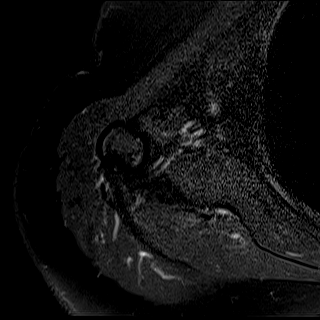
[im 11/26]
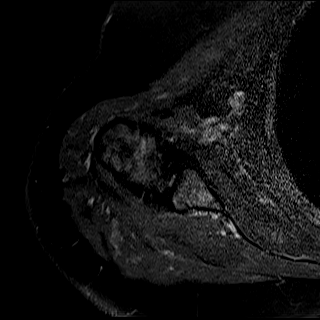
[im 15/26]
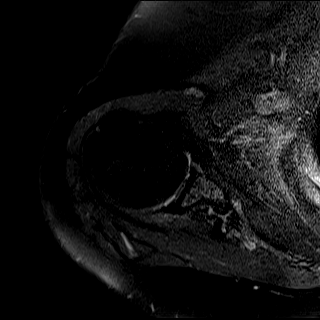
[im 18/26]
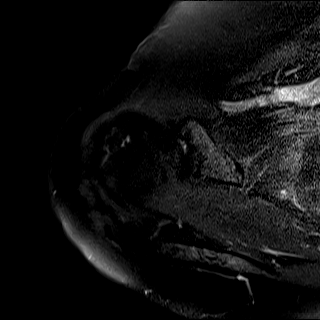
[im 22/26]
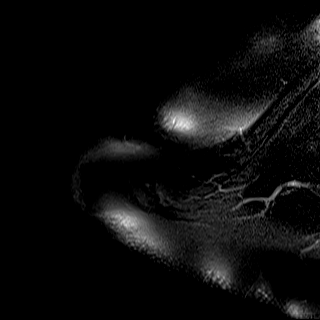
[im 26/26]
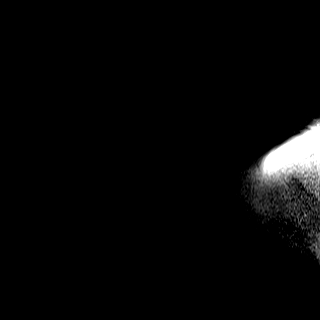

[Series 6: PD · oblique · right · 4.0mm · 0.44mm/px · 8 of 22 slices shown]
[im 1/22]
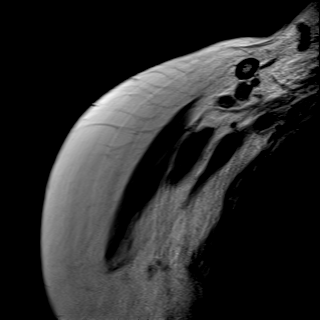
[im 4/22]
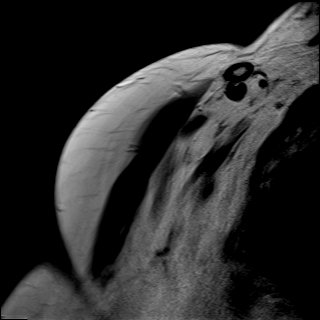
[im 7/22]
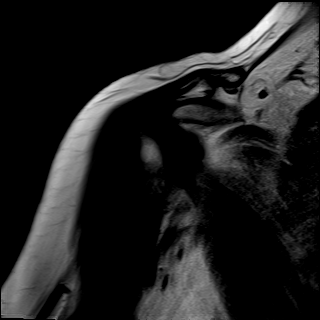
[im 10/22]
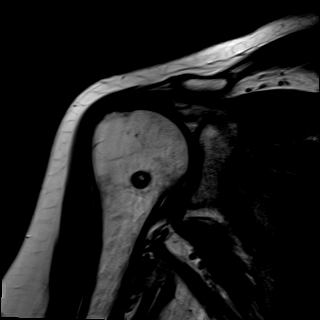
[im 13/22]
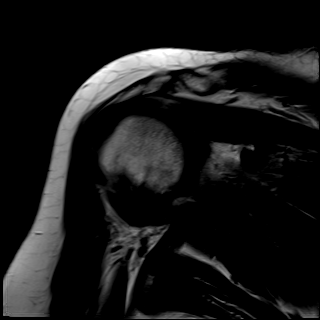
[im 16/22]
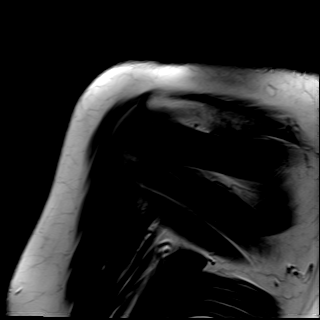
[im 19/22]
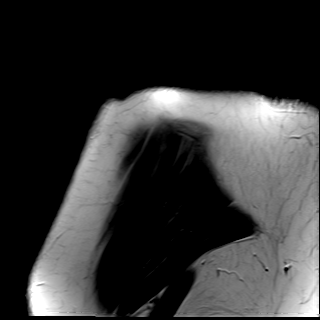
[im 22/22]
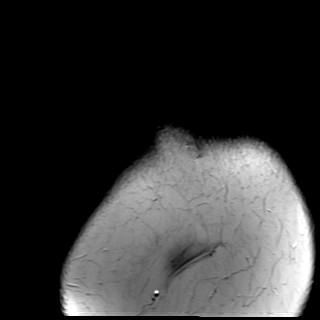

[Series 7: T2 fat-sat · oblique · right · 4.0mm · 0.44mm/px · 8 of 22 slices shown (2 of 3)]
[im 1/22]
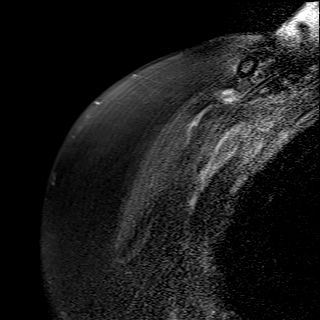
[im 4/22]
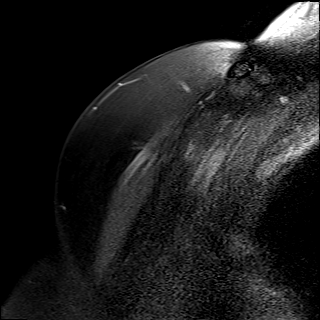
[im 7/22]
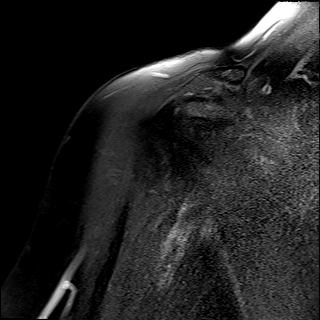
[im 10/22]
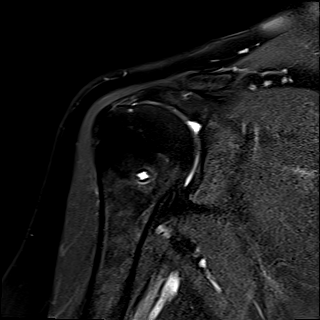
[im 13/22]
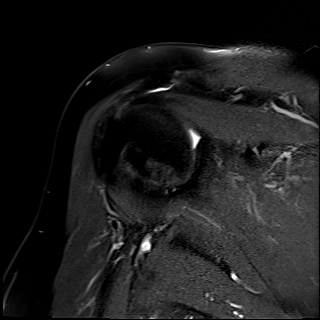
[im 16/22]
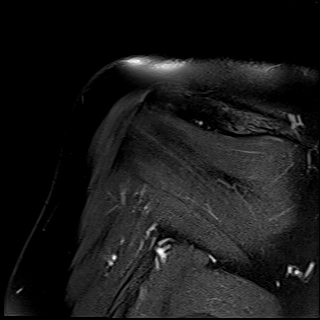
[im 19/22]
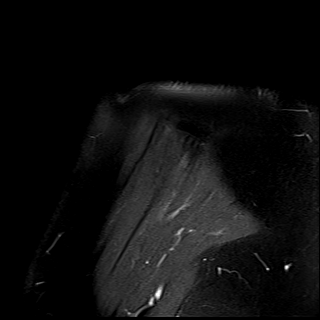
[im 22/22]
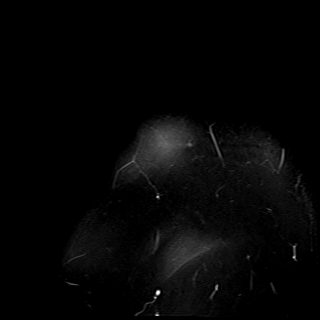

[Series 8: T2 fat-sat · coronal · right · 4.0mm · 0.22mm/px · 6 of 22 slices shown (3 of 3)]
[im 1/22]
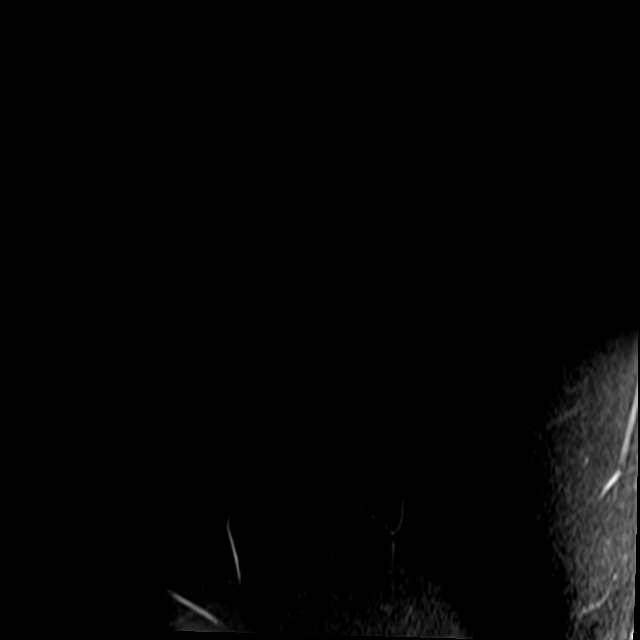
[im 4/22]
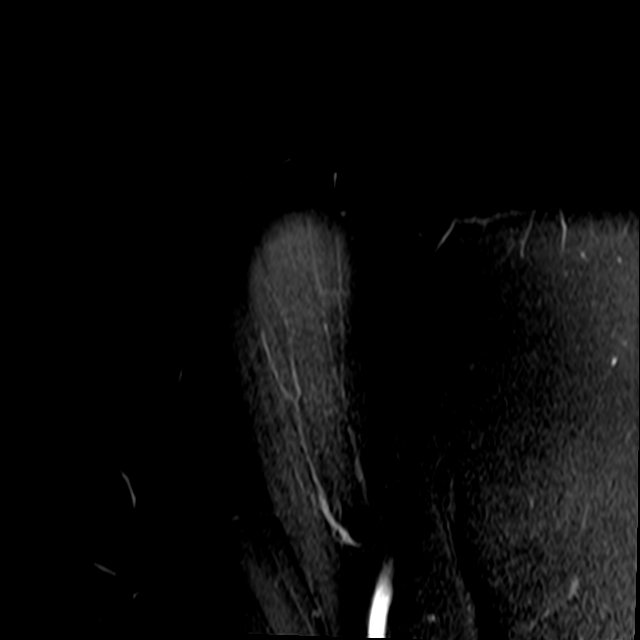
[im 7/22]
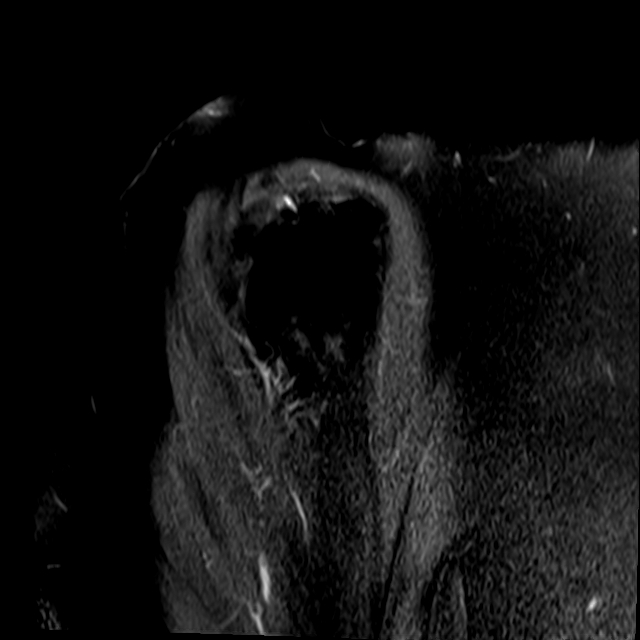
[im 10/22]
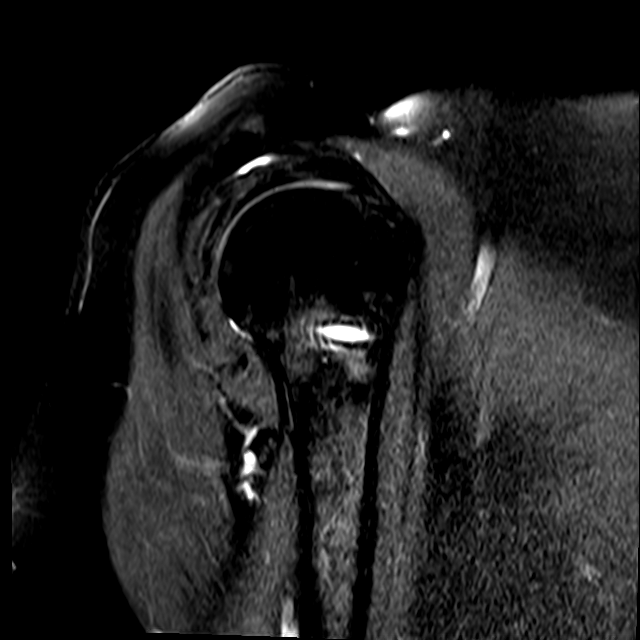
[im 13/22]
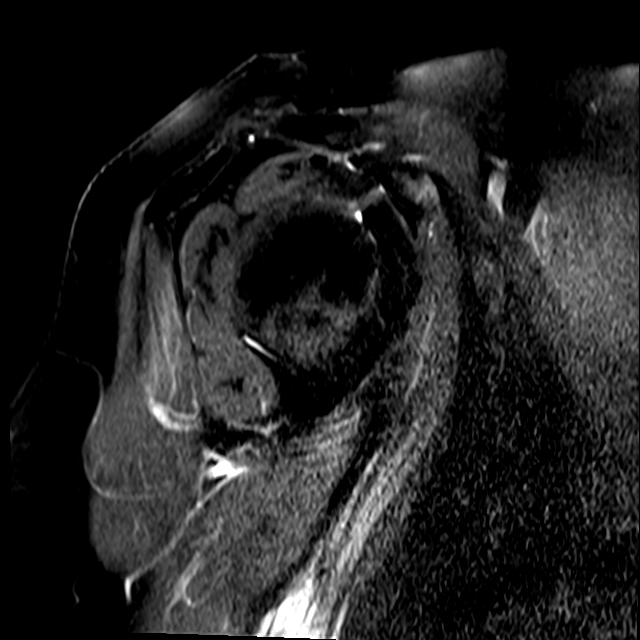
[im 19/22]
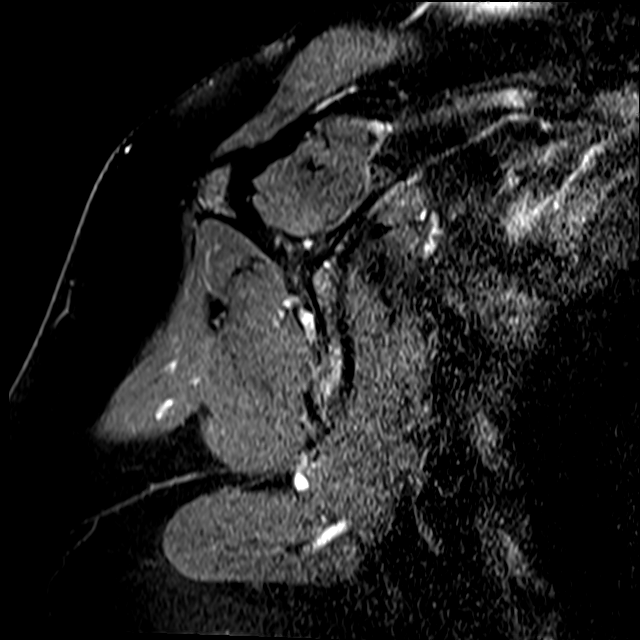

[30 of 40 positions shown; findings below may reference images not displayed]

FINDINGS: Rotator cuff: Mild distal supraspinatus and infraspinatus
tendinosis. There is a low-grade articular sided tear measuring 5 mm
in width at the tendon overlap (coronal T2 image 11, sagittal T2
image 8). Teres minor tendon is intact. Subscapularis tendon is
intact.

Muscles: No significant muscle atrophy.

Biceps Long Head: Prior biceps tenodesis which appears intact.

Acromioclavicular Joint: No significant arthropathy of the
acromioclavicular joint. Minimal subacromial/subdeltoid bursal
fluid.

Glenohumeral Joint: No significant joint effusion.  Mild chondrosis.

Labrum: There is no structure resembling an intact superior labrum
from approximately [DATE]. There is a thin residual anterior
superior labrum from [DATE]. The anterior inferior and
posteroinferior labrum is intact.

Bones: No acute fracture or dislocation. No aggressive osseous
lesion. Glenohumeral osteophyte formation

Other: No fluid collection or hematoma.
IMPRESSION: Mild distal supraspinatus and infraspinatus tendinosis. Low-grade
articular sided tear measuring 5 mm in width at the tendon overlap.
No significant muscle atrophy.

Prior biceps tenodesis.

Either degenerated and torn or surgically removed superior labrum
from approximately [DATE]. Thin residual anterior superior
labrum from [DATE]. Anterior inferior and posterior inferior
labrum appear intact.

Mild glenohumeral osteoarthritis.

## 2021-09-05 IMAGING — MR MR LUMBAR SPINE W/O CM
5 series · 31 of 48 positions shown · non-contrast
Comparison: None.

CLINICAL DATA: Chronic low back pain with leg weakness

EXAM:
MRI LUMBAR SPINE WITHOUT CONTRAST
TECHNIQUE: Multiplanar, multisequence MR imaging of the lumbar spine was
performed. No intravenous contrast was administered.

[Series 5: T2 · sagittal · 4.0mm · 0.81mm/px · 6 of 15 slices shown (1 of 2)]
[im 1/15]
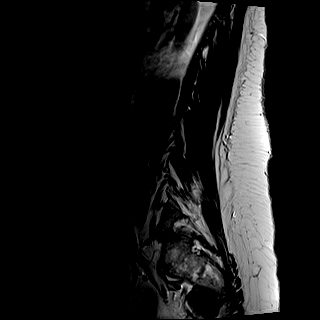
[im 3/15]
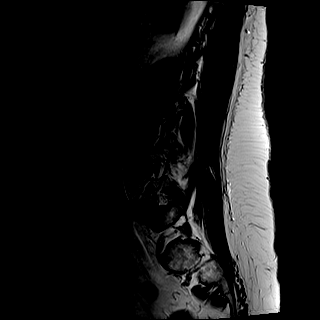
[im 6/15]
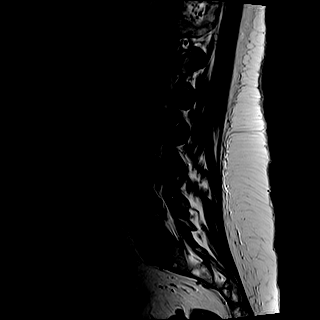
[im 9/15]
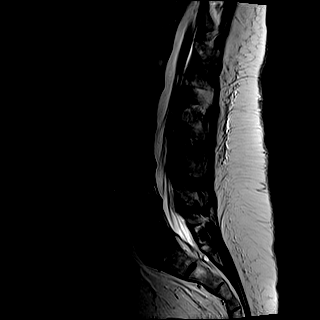
[im 12/15]
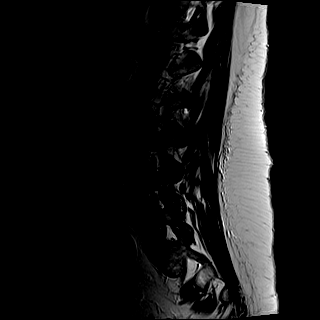
[im 15/15]
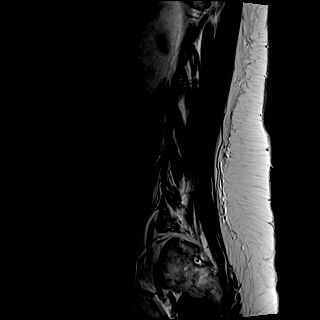

[Series 6: T1 · sagittal · 4.0mm · 0.81mm/px · 6 of 15 slices shown (1 of 2)]
[im 1/15]
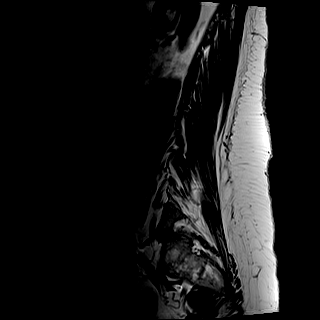
[im 3/15]
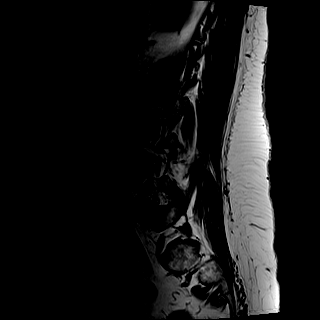
[im 6/15]
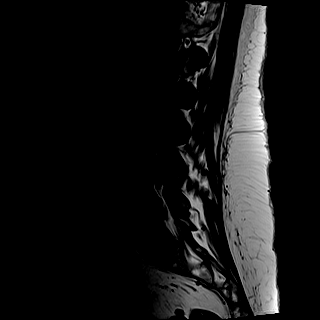
[im 9/15]
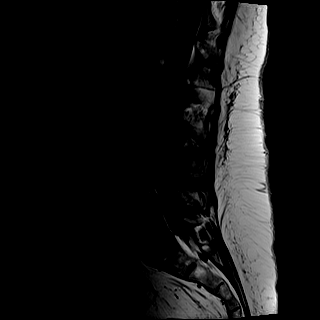
[im 12/15]
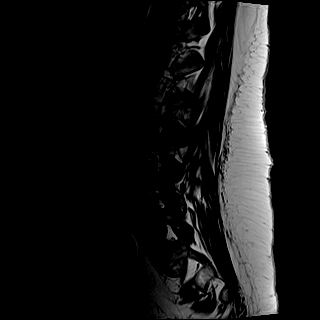
[im 15/15]
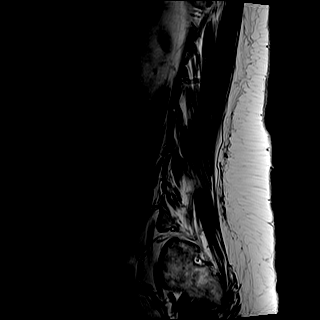

[Series 7: STIR · sagittal · 4.0mm · 0.41mm/px · 1 of 15 slices shown]
[im 1/15]
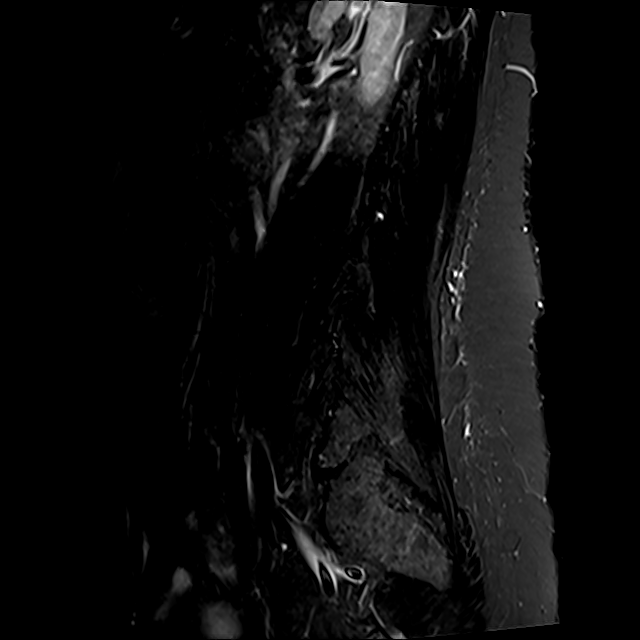

[Series 8: T2 · axial · 4.0mm · 0.78mm/px · z∈[-97,+127]mm · 9 of 36 slices shown (2 of 2)]
[im 1/36]
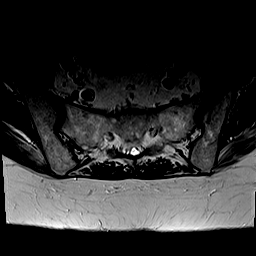
[im 6/36]
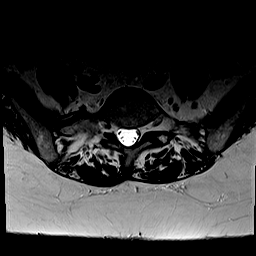
[im 11/36]
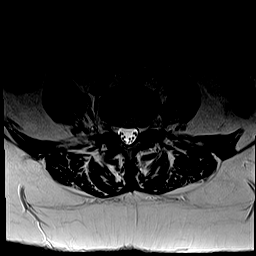
[im 16/36]
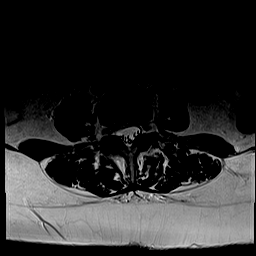
[im 18/36]
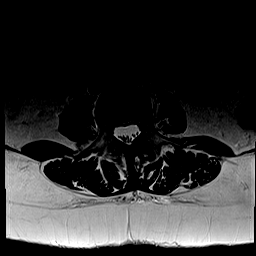
[im 21/36]
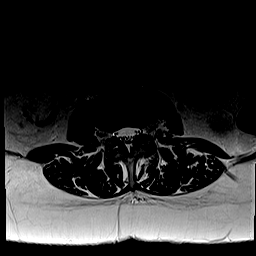
[im 26/36]
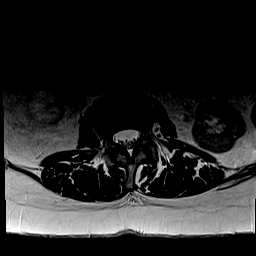
[im 31/36]
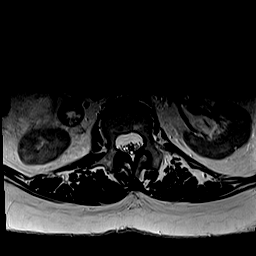
[im 36/36]
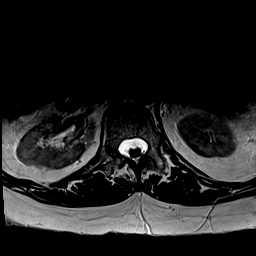

[Series 9: T1 · axial · 4.0mm · 0.39mm/px · z∈[-97,+127]mm · 9 of 36 slices shown (2 of 2)]
[im 1/36]
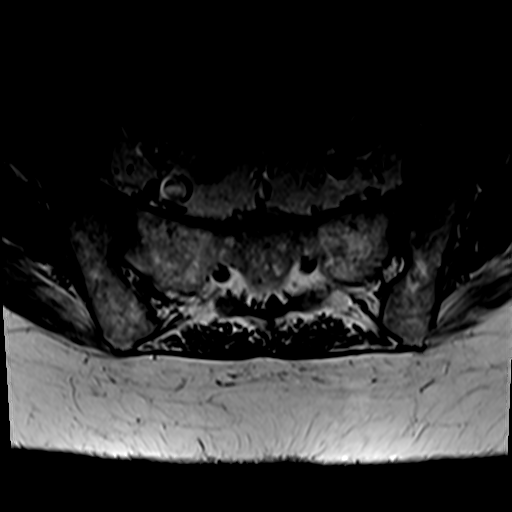
[im 6/36]
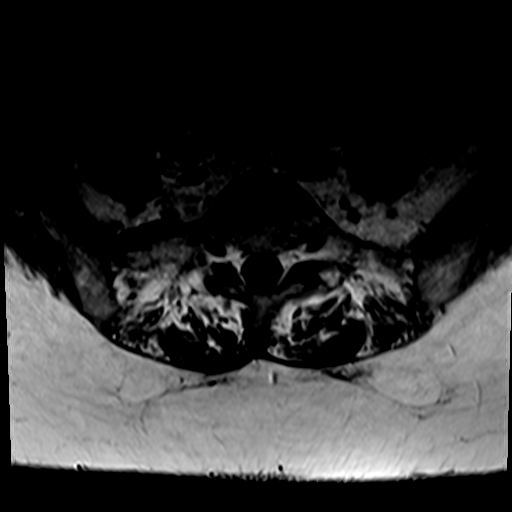
[im 11/36]
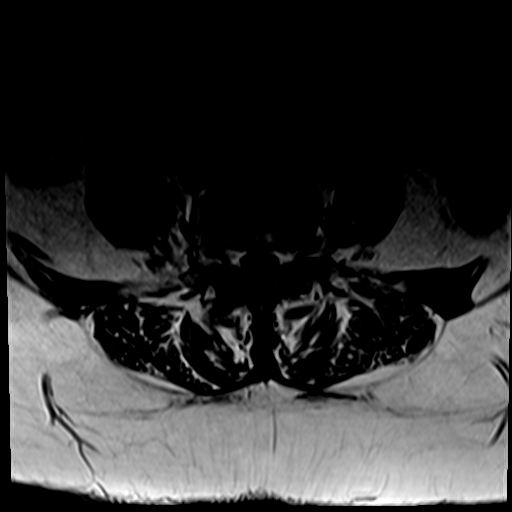
[im 16/36]
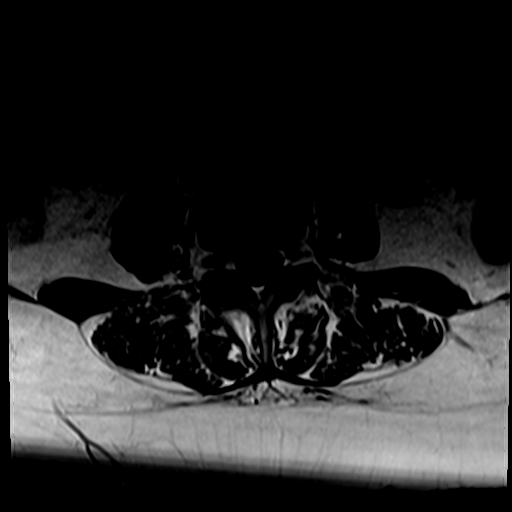
[im 18/36]
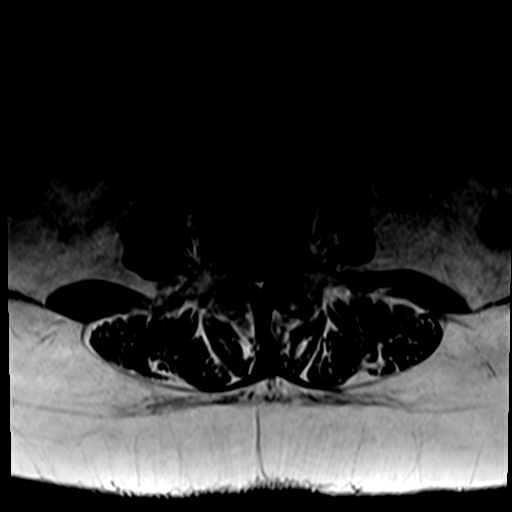
[im 21/36]
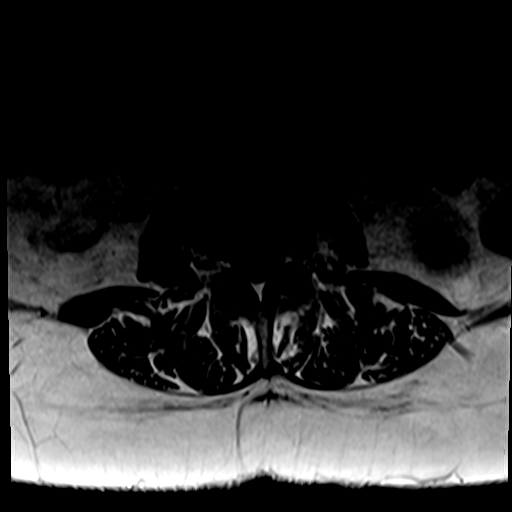
[im 26/36]
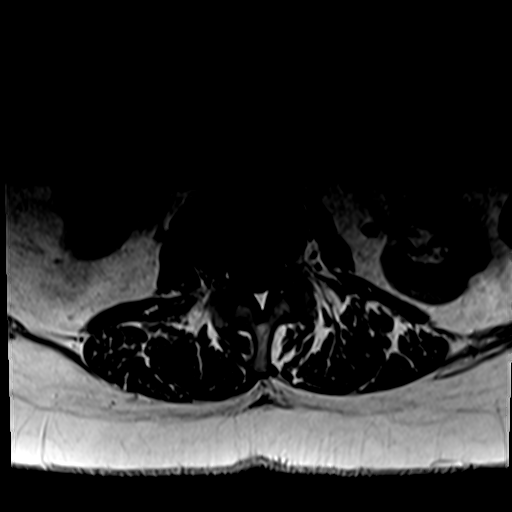
[im 31/36]
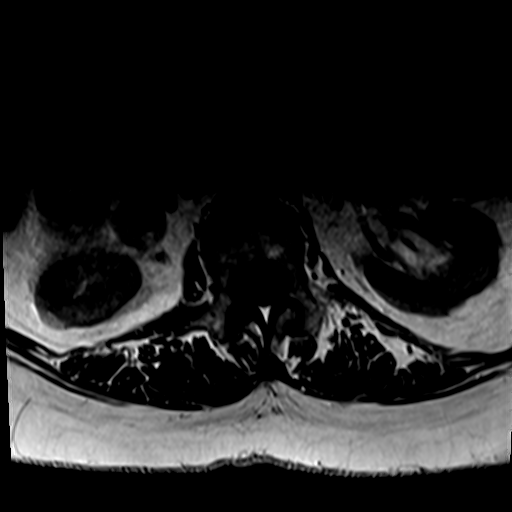
[im 36/36]
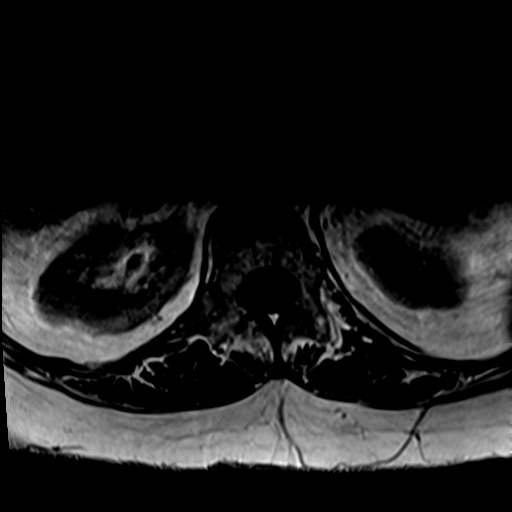

[31 of 48 positions shown; findings below may reference images not displayed]

FINDINGS: Segmentation: 5 lumbar type vertebrae. Incomplete segmentation at
the L5-S1 transverse processes.

Alignment:  Mild dextrocurvature.  No listhesis

Vertebrae:  No fracture, evidence of discitis, or bone lesion.

Conus medullaris and cauda equina: Conus extends to the L1 level.
Conus and cauda equina appear normal.

Paraspinal and other soft tissues: Negative for perispinal mass or
inflammation

Disc levels:

T12- L1: Unremarkable.

L1-L2: Small right inferior foraminal protrusion

L2-L3: Degenerative ganglion at the left ligamentum flavum measuring
4 mm, noncompressive

L3-L4: Disc narrowing and rightward bulging with right paracentral
protrusion impinging on the right L4 nerve root. Mild facet spurring
asymmetric to the right

L4-L5: Mild disc narrowing and bulging. Left foraminal annular
fissure. Mild facet spurring

L5-S1:Incomplete segmentation.  No herniation or impingement
IMPRESSION: 1. L3-4 right paracentral protrusion impinging on the right L4 nerve
root.
2. Elsewhere noncompressive degenerative changes with mild
scoliosis, as above.

## 2021-09-05 MED ORDER — GADOBUTROL 1 MMOL/ML IV SOLN
7.5000 mL | Freq: Once | INTRAVENOUS | Status: AC | PRN
  Administered 2021-09-05: 7.5 mL via INTRAVENOUS

## 2021-09-15 ENCOUNTER — Other Ambulatory Visit: Payer: Self-pay | Admitting: Family Medicine

## 2021-09-15 ENCOUNTER — Ambulatory Visit
Admission: RE | Admit: 2021-09-15 | Discharge: 2021-09-15 | Disposition: A | Discharge status: Home / Self Care | Payer: Medicare PPO | Source: Ambulatory Visit | Attending: Family Medicine | Admitting: Family Medicine

## 2021-09-15 DIAGNOSIS — M79602 Pain in left arm: Secondary | ICD-10-CM | POA: Diagnosis present

## 2021-09-15 DIAGNOSIS — M7989 Other specified soft tissue disorders: Secondary | ICD-10-CM | POA: Insufficient documentation

## 2021-09-15 IMAGING — US US EXTREM  UP VENOUS*L*
1 series · 13 of 24 positions shown · non-contrast
Comparison: None Available.

CLINICAL DATA: Left arm swelling for the past 9 days since having a
peripheral IV placed.



[Series 1: us venous img upper uni left (dvt) · portal-venous · 13 of 28 slices shown]
[im 1/28]
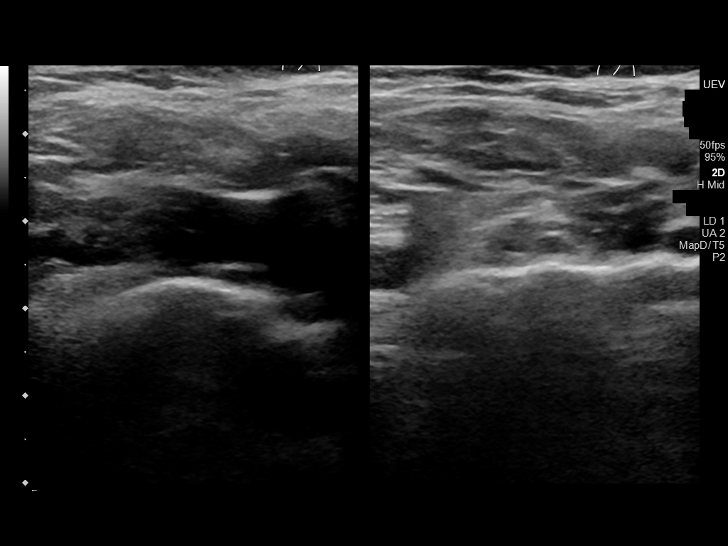
[im 3/28]
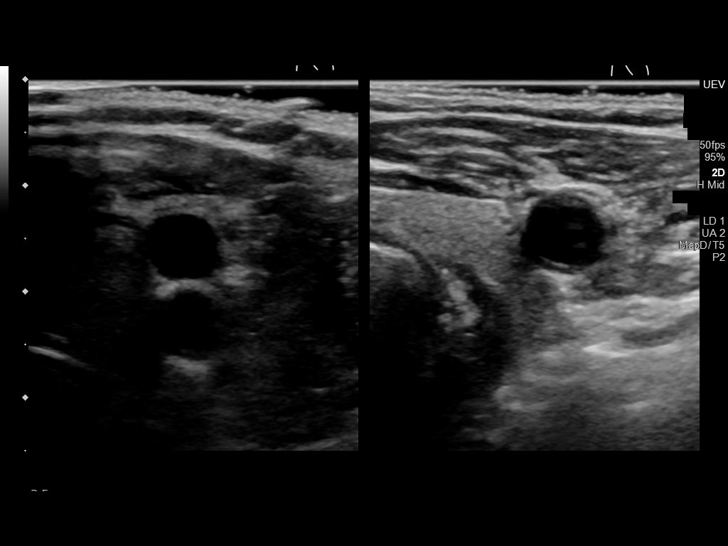
[im 5/28]
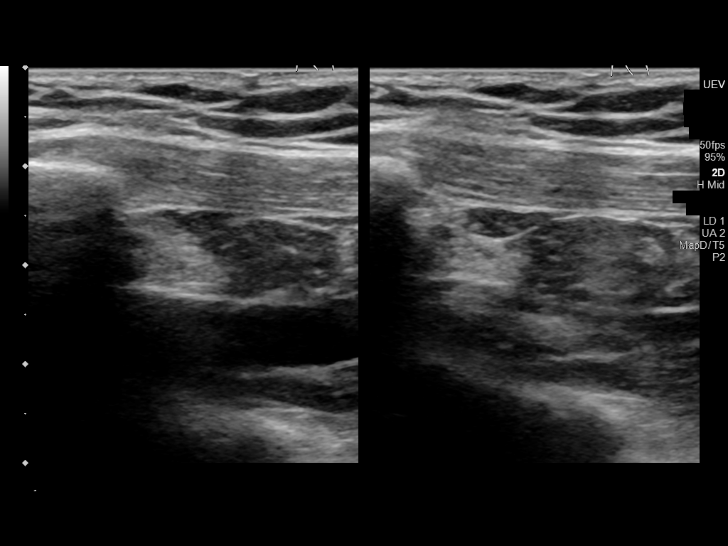
[im 8/28]
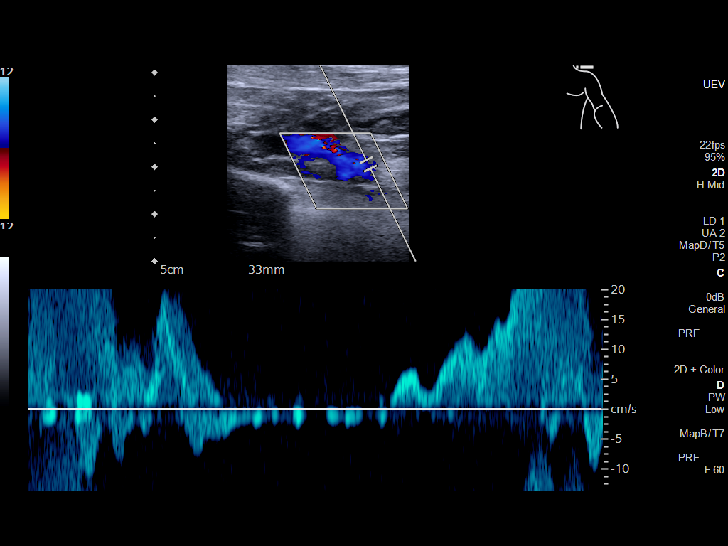
[im 10/28]
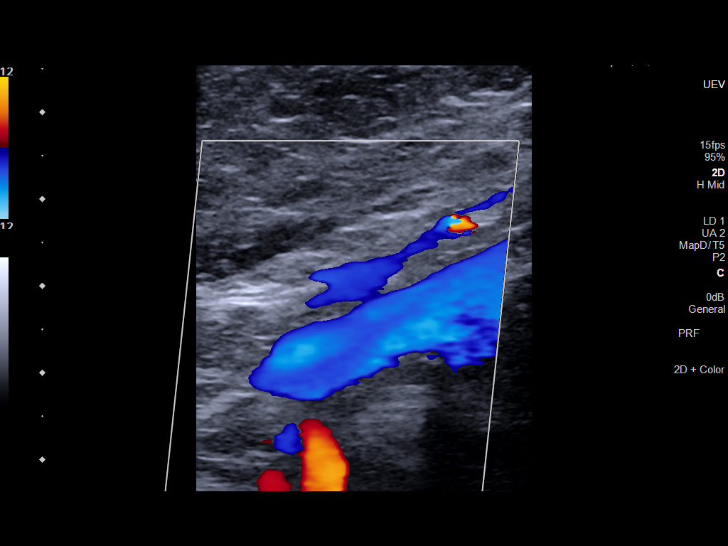
[im 12/28]
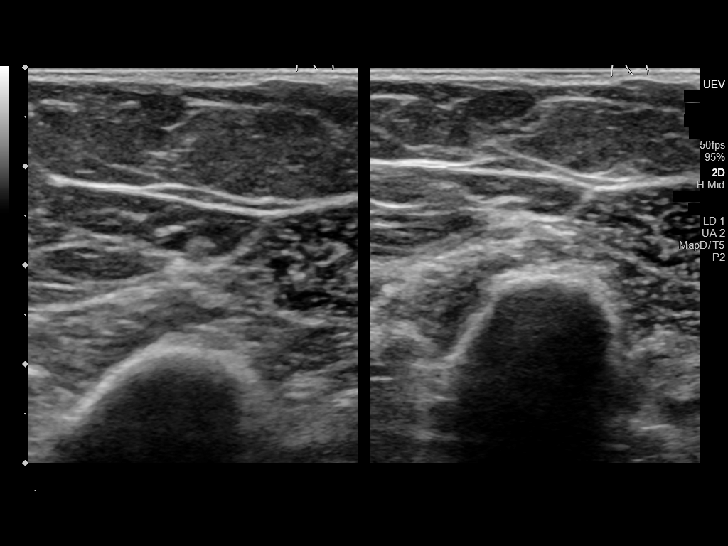
[im 15/28]
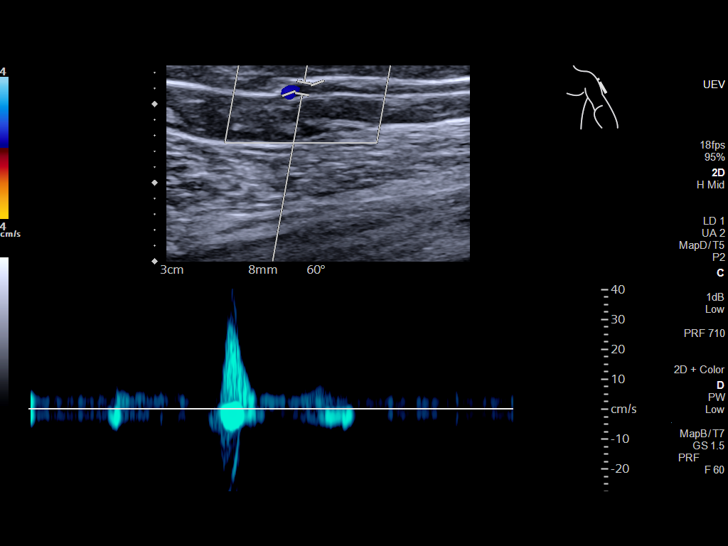
[im 16/28]
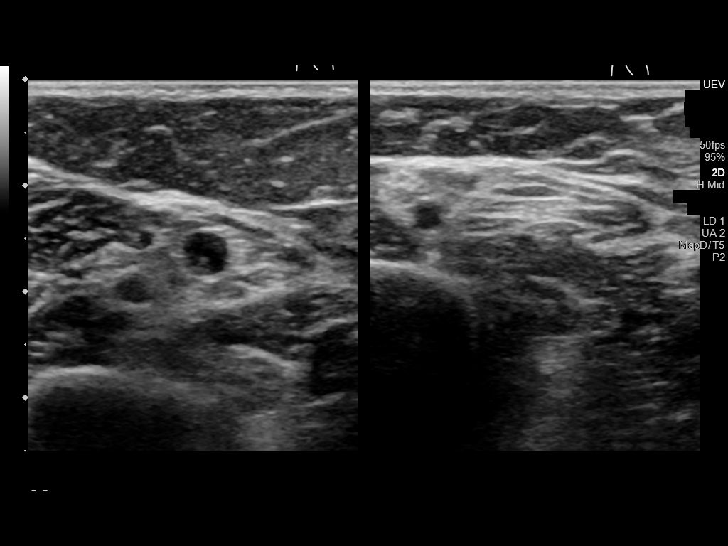
[im 18/28]
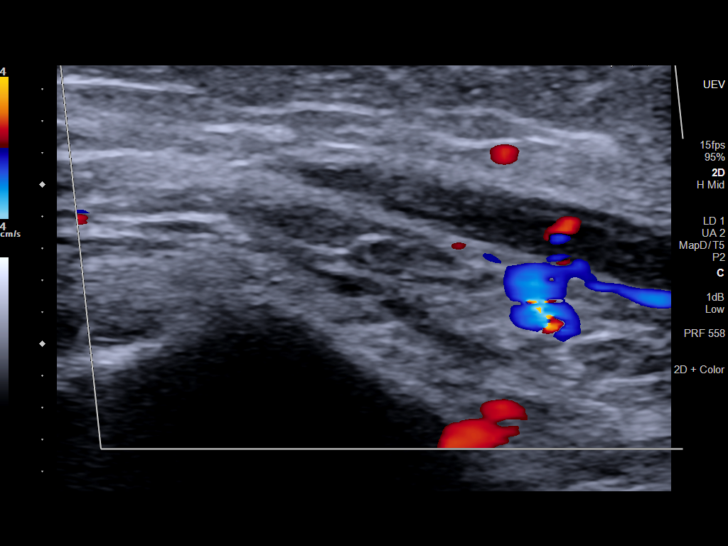
[im 20/28]
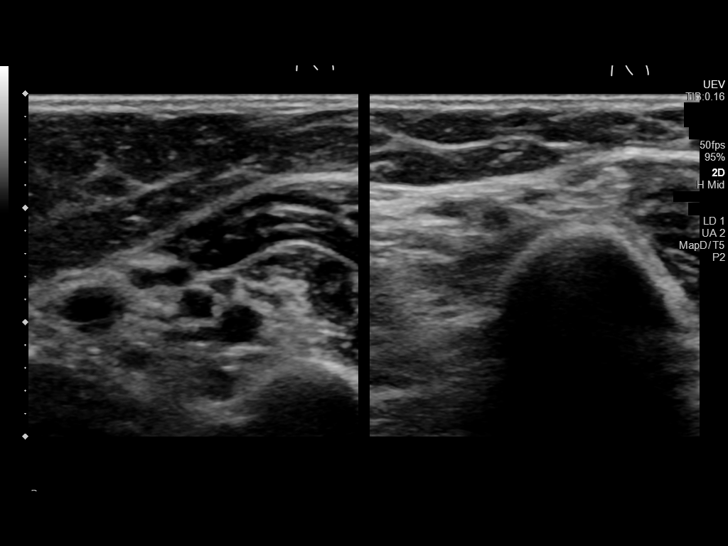
[im 23/28]
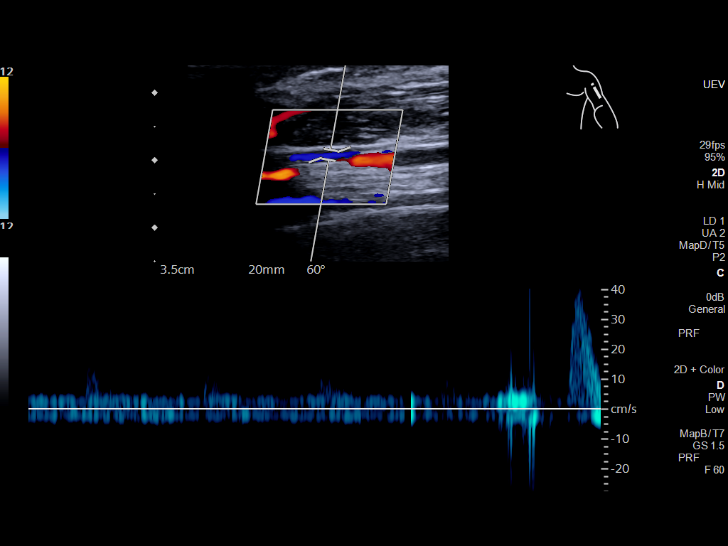
[im 25/28]
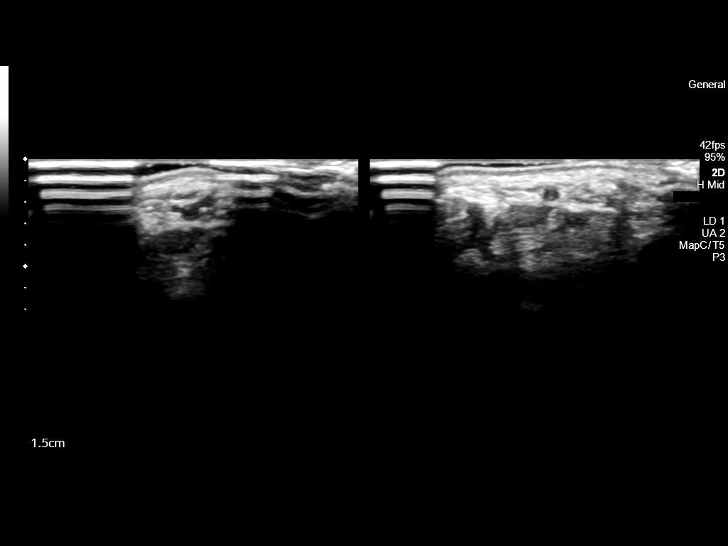
[im 28/28]
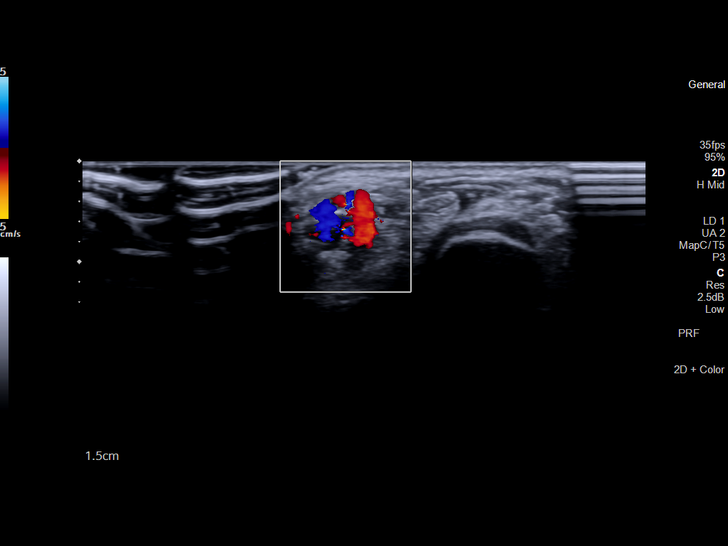

[13 of 24 positions shown; findings below may reference images not displayed]

FINDINGS: Contralateral Subclavian Vein: Respiratory phasicity is normal and
symmetric with the symptomatic side. No evidence of thrombus. Normal
compressibility.

Internal Jugular Vein: No evidence of thrombus. Normal
compressibility, respiratory phasicity and response to augmentation.

Subclavian Vein: No evidence of thrombus. Normal compressibility,
respiratory phasicity and response to augmentation.

Axillary Vein: No evidence of thrombus. Normal compressibility,
respiratory phasicity and response to augmentation.

Cephalic Vein: No evidence of thrombus. Normal compressibility,
respiratory phasicity and response to augmentation.

Basilic Vein: While the proximal aspect of the left basilic vein is
widely patent (image 17), there is hypoechoic occlusive thrombus
involving the basilic vein at the level of the distal humerus
(images 18 through 20).

Brachial Veins: No evidence of thrombus. Normal compressibility,
respiratory phasicity and response to augmentation.

Radial Veins: No evidence of thrombus. Normal compressibility,
respiratory phasicity and response to augmentation.

Ulnar Veins: No evidence of thrombus. Normal compressibility,
respiratory phasicity and response to augmentation.

Other Findings:  None visualized.
IMPRESSION: 1. No evidence of DVT within the left upper extremity.
2. The examination is positive for short-segment occlusive
superficial thrombophlebitis involving the basilic vein at the level
of the distal humerus. Again, there is no extension of this
short-segment occlusive SVT to the more proximal venous system of
the left lower extremity.

## 2021-11-09 ENCOUNTER — Other Ambulatory Visit: Payer: Self-pay | Admitting: Internal Medicine

## 2021-11-09 DIAGNOSIS — I739 Peripheral vascular disease, unspecified: Secondary | ICD-10-CM

## 2021-11-13 ENCOUNTER — Ambulatory Visit
Admission: RE | Admit: 2021-11-13 | Discharge: 2021-11-13 | Disposition: A | Discharge status: Home / Self Care | Payer: Medicare PPO | Source: Ambulatory Visit | Attending: Internal Medicine | Admitting: Internal Medicine

## 2021-11-13 DIAGNOSIS — I739 Peripheral vascular disease, unspecified: Secondary | ICD-10-CM | POA: Insufficient documentation

## 2022-01-03 ENCOUNTER — Emergency Department: Payer: Medicare PPO

## 2022-01-03 ENCOUNTER — Encounter: Payer: Self-pay | Admitting: Emergency Medicine

## 2022-01-03 ENCOUNTER — Other Ambulatory Visit: Payer: Self-pay

## 2022-01-03 ENCOUNTER — Emergency Department
Admission: EM | Admit: 2022-01-03 | Discharge: 2022-01-04 | Disposition: A | Discharge status: Home / Self Care | Payer: Medicare PPO | Attending: Emergency Medicine | Admitting: Emergency Medicine

## 2022-01-03 DIAGNOSIS — M79605 Pain in left leg: Secondary | ICD-10-CM | POA: Insufficient documentation

## 2022-01-03 DIAGNOSIS — I129 Hypertensive chronic kidney disease with stage 1 through stage 4 chronic kidney disease, or unspecified chronic kidney disease: Secondary | ICD-10-CM | POA: Diagnosis not present

## 2022-01-03 DIAGNOSIS — R600 Localized edema: Secondary | ICD-10-CM | POA: Diagnosis not present

## 2022-01-03 DIAGNOSIS — R0602 Shortness of breath: Secondary | ICD-10-CM | POA: Diagnosis not present

## 2022-01-03 DIAGNOSIS — N1831 Chronic kidney disease, stage 3a: Secondary | ICD-10-CM | POA: Diagnosis not present

## 2022-01-03 DIAGNOSIS — Z85828 Personal history of other malignant neoplasm of skin: Secondary | ICD-10-CM | POA: Diagnosis not present

## 2022-01-03 DIAGNOSIS — Z79899 Other long term (current) drug therapy: Secondary | ICD-10-CM | POA: Insufficient documentation

## 2022-01-03 DIAGNOSIS — R609 Edema, unspecified: Secondary | ICD-10-CM

## 2022-01-03 MED ORDER — HYDROMORPHONE HCL 1 MG/ML IJ SOLN
0.5000 mg | Freq: Once | INTRAMUSCULAR | Status: AC
  Administered 2022-01-04: 0.5 mg via INTRAVENOUS
  Filled 2022-01-03: qty 0.5

## 2022-01-03 MED ORDER — SODIUM CHLORIDE 0.9 % IV BOLUS
1000.0000 mL | Freq: Once | INTRAVENOUS | Status: AC
Start: 2022-01-04 — End: 2022-01-04
  Administered 2022-01-04: 1000 mL via INTRAVENOUS

## 2022-01-03 NOTE — ED Provider Triage Note (Signed)
  Emergency Medicine Provider Triage Evaluation Note  [facility] [place] , a 66 y.o.female,  was evaluated in triage.  Pt complains of pain/swelling in the left lower leg x3 days.  Reports pain in the lower calf, as well as the anterior aspect of her left ankle.  Reports some discoloration/bruising as well.  Denies any recent history of injuries or falls.  She does note that she has a history of peripheral vascular disease.   Review of Systems  Positive: Left leg pain, discoloration Negative: Denies fever, chest pain, vomiting  Physical Exam  There were no vitals filed for this visit. Gen:   Awake, no distress   Resp:  Normal effort  MSK:   Moves extremities without difficulty  Other:    Medical Decision Making  Given the patient's initial medical screening exam, the following diagnostic evaluation has been ordered. The patient will be placed in the appropriate treatment space, once one is available, to complete the evaluation and treatment. I have discussed the plan of care with the patient and I have advised the patient that an ED physician or mid-level practitioner will reevaluate their condition after the test results have been received, as the results may give them additional insight into the type of treatment they may need.    Diagnostics: DVT ultrasound, left lower extremity  Treatments: none immediately   Varney Daily, [place] 01/03/22 1643

## 2022-01-03 NOTE — ED Triage Notes (Addendum)
Pt reports left leg pain and cramping that started 2 weeks ago. Pt also reports "mild shortness of breath when walking."

## 2022-01-03 NOTE — ED Provider Notes (Signed)
[facility] Provider Note    Event Date/Time   First MD Initiated Contact with Patient 01/03/22 2259     (approximate)   History   Leg Pain   HPI {Remember to add pertinent medical, surgical, social, and/or OB history to HPI:1} Amy Huynh is a 66 y.o. female  ***       Past Medical History   Past Medical History:  Diagnosis Date  . Arthritis   . Family history of ovarian cancer 12/2016   MyRisk neg  . Fibromyalgia   . Migraine   . Neuropathy   . Placenta previa   . Sjogren's disease [facility])      Active Problem List   Patient Active Problem List   Diagnosis Date Noted  . Melena   . Heartburn   . Acute peptic ulcer of stomach   . Bradycardia 10/16/2020  . Stage 3a chronic kidney disease ([place]) 06/24/2020  . Abnormal ECG 05/22/2020  . History of degenerative disc disease 01/29/2020  . LVH (left ventricular hypertrophy) 11/15/2019  . Acute respiratory failure with hypoxia ([place]) 09/16/2019  . Multilobar lung infiltrate 09/16/2019  . Hypomagnesemia 09/16/2019  . Muscle weakness (generalized) 09/16/2019  . Community acquired pneumonia 09/12/2019  . Hypokalemia 09/12/2019  . Psoriasis 02/15/2019  . Personal history of skin cancer 12/01/2018  . Sjogren's syndrome ([place]) 10/11/2017  . Family history of ovarian cancer 01/11/2017  . Acute bursitis of right shoulder 06/07/2016  . Partial tear of right rotator cuff 06/07/2016  . Lumbar radiculopathy 07/23/2015  . Migraine without aura and without status migrainosus, not intractable 04/23/2015  . Chronic daily headache 04/23/2015  . DDD (degenerative disc disease), cervical 07/06/2014  . Neck pain 07/06/2014  . Dyspareunia 12/14/2013  . Benign essential hypertension 09/13/2013  . Polyarthralgia 07/20/2013  . Diverticula of colon 01/22/2013  . Fibromyalgia 01/22/2013  . Restless legs syndrome 01/22/2013  . Moderate obstructive sleep apnea 01/22/2013  . Migraine headache  01/22/2013  . Insomnia 01/22/2013  . Generalized anxiety disorder 01/22/2013  . Esophageal reflux 01/22/2013  . Atrophic vaginitis 01/22/2013  . Anemia 01/22/2013  . Allergic rhinitis 01/22/2013  . Urticaria 01/18/2013  . Low back pain 10/03/2012  . Lumbosacral spondylosis 08/17/2012     Past Surgical History   Past Surgical History:  Procedure Laterality Date  . BIOPSY N/A 06/26/2021   Procedure: BIOPSY;  Surgeon: Lucilla Lame, MD;  Location: [place];  Service: Endoscopy;  Laterality: N/A;  . CERVICAL FUSION    . CESAREAN SECTION    . CHOLECYSTECTOMY    . ESOPHAGOGASTRODUODENOSCOPY (EGD) WITH PROPOFOL N/A 06/26/2021   Procedure: ESOPHAGOGASTRODUODENOSCOPY (EGD) WITH PROPOFOL;  Surgeon: Lucilla Lame, MD;  Location: [place];  Service: Endoscopy;  Laterality: N/A;  . HERNIA REPAIR    . REPLACEMENT TOTAL KNEE Left   . SHOULDER SURGERY Right      Home Medications   Prior to Admission medications   Medication Sig Start Date End Date Taking? Authorizing Provider  butalbital-acetaminophen-caffeine (FIORICET, ESGIC) 50-325-40 MG tablet Take 1 tablet by mouth every 6 (six) hours as needed for headache.    [provider]  cetirizine (ZYRTEC) 10 MG tablet Take 10 mg by mouth daily.    [provider]  clobetasol cream (TEMOVATE) 0.05 % Apply topically. 09/07/17   [provider]  cyanocobalamin 1000 MCG tablet Take 1,000 mcg by mouth daily.    [provider]  dexlansoprazole (DEXILANT) 60 MG capsule Take 1 capsule (60 mg  total) by mouth daily. 07/23/21   Midge Minium, MD  fexofenadine (ALLEGRA) 180 MG tablet Take 180 mg by mouth daily.    [provider]  folic acid (FOLVITE) 1 MG tablet Take 1 mg by mouth daily. 08/16/19   [provider]  gabapentin (NEURONTIN) 300 MG capsule Take 900 mg by mouth at bedtime.    [provider]  hydrOXYzine (ATARAX/VISTARIL) 10 MG tablet Take 10 mg by mouth every 8  (eight) hours as needed.    [provider]  hyoscyamine (LEVSIN, ANASPAZ) 0.125 MG tablet Take 0.125 mg by mouth every 4 (four) hours as needed.    [provider]  methocarbamol (ROBAXIN) 500 MG tablet Take 500 mg by mouth at bedtime.  08/22/17   [provider]  methotrexate (RHEUMATREX) 2.5 MG tablet Take 25 mg by mouth once a week. Take 5 tablets by mouth in the morning and 5 tablets by mouth in the evening on Saturdays 02/08/18   [provider]  pantoprazole (PROTONIX) 40 MG tablet Take 1 tablet (40 mg total) by mouth 2 (two) times daily. 01/28/21 06/26/21  Midge Minium, MD  pilocarpine (SALAGEN) 5 MG tablet Take 5 mg by mouth 3 (three) times daily.    [provider]  promethazine (PHENERGAN) 25 MG tablet Take 25 mg by mouth every 6 (six) hours as needed for nausea or vomiting.    [provider]  rizatriptan (MAXALT) 10 MG tablet Take 10 mg by mouth as needed for migraine. May repeat in 2 hours if needed    [provider]  rOPINIRole (REQUIP) 0.5 MG tablet Take 1 tablet by mouth at bedtime.  07/26/19 06/26/21  [provider]  topiramate (TOPAMAX) 100 MG tablet Take 200 mg by mouth daily.    [provider]  valACYclovir (VALTREX) 1000 MG tablet Take 1 tablet once a day by mouth 07/14/16   [provider]  VITAMIN D, CHOLECALCIFEROL, PO Take by mouth.    [provider]     Allergies  Hydrocodone-acetaminophen, Hydrocodone-acetaminophen, Morphine, Morphine and related, Cymbalta [duloxetine hcl], Duloxetine, and Pregabalin   Family History   Family History  Problem Relation Age of Onset  . Ovarian cancer Maternal Aunt 30  . Uterine cancer Mother        33s  . COPD Mother   . Hypertension Mother   . Uterine cancer Maternal Grandmother        ? age  . Heart attack Father 56  . Other Father        DDD     Physical Exam  Triage Vital Signs: ED Triage Vitals [01/03/22 1717]  Enc  Vitals Group     BP 119/75     Pulse Rate 81     Resp 16     Temp 98.2 F (36.8 C)     Temp Source Oral     SpO2 95 %     Weight      Height      Head Circumference      Peak Flow      Pain Score      Pain Loc      Pain Edu?      Excl. in GC?     Updated Vital Signs: BP (!) 147/74 (BP Location: Left Arm)   Pulse 63   Temp 97.9 F (36.6 C) (Oral)   Resp 16   SpO2 100%   {Only need to document appropriate and relevant physical exam:1}  General: Awake, no distress. *** CV:  Good peripheral perfusion. *** Resp:  Normal effort. *** Abd:  No distention. *** Other:  ***   ED Results / Procedures / Treatments  Labs (all labs ordered are listed, but only abnormal results are displayed) Labs Reviewed - No data to display   EKG  ***   RADIOLOGY *** {You MUST document your own interpretation of imaging, as well as the fact that you reviewed the radiologist's report!:1}  Official radiology report(s): US Venous Img Lower  Left (DVT Study)  Result Date: 01/03/2022 CLINICAL DATA:  Redness EXAM: LEFT LOWER EXTREMITY VENOUS DOPPLER ULTRASOUND TECHNIQUE: Gray-scale sonography with compression, as well as color and duplex ultrasound, were performed to evaluate the deep venous system(s) from the level of the common femoral vein through the popliteal and proximal calf veins. COMPARISON:  None Available. FINDINGS: VENOUS Normal compressibility of the common femoral, superficial femoral, and popliteal veins, as well as the visualized calf veins. Visualized portions of profunda femoral vein and great saphenous vein unremarkable. No filling defects to suggest DVT on grayscale or color Doppler imaging. Doppler waveforms show normal direction of venous flow, normal respiratory plasticity and response to augmentation. Limited views of the contralateral common femoral vein are unremarkable. OTHER None. Limitations: none IMPRESSION: Negative. Electronically Signed   By: Charlett Nose M.D.   On:  01/03/2022 17:19     PROCEDURES:  Critical Care performed: {CriticalCareYesNo:19197::"Yes, see critical care procedure note(s)","No"}  Procedures   MEDICATIONS ORDERED IN ED: Medications - No data to display   IMPRESSION / MDM / ASSESSMENT AND PLAN / ED COURSE  I reviewed the triage vital signs and the nursing notes.                              Differential diagnosis includes, but is not limited to, ***  Patient's presentation is most consistent with {EM COPA:27473}  {If the patient is on the monitor, remove the brackets and asterisks on the sentence below and remember to document it as a Procedure as well. Otherwise delete the sentence below:1} {**The patient is on the cardiac monitor to evaluate for evidence of arrhythmia and/or significant heart rate changes.**}  {Remember to include, when applicable, any/all of the following data: independent review of imaging independent review of labs (comment specifically on pertinent positives and negatives) review of specific prior hospitalizations, PCP/specialist notes, etc. discuss meds given and prescribed document any discussion with consultants (including hospitalists) any clinical decision tools you used and why (PECARN, NEXUS, etc.) did you consider admitting the patient? document social determinants of health affecting patient's care (homelessness, inability to follow up in a timely fashion, etc) document any pre-existing conditions increasing risk on current visit (e.g. diabetes and HTN increasing danger of high-risk chest pain/ACS) describes what meds you gave (especially parenteral) and why any other interventions?:1}      FINAL CLINICAL IMPRESSION(S) / ED DIAGNOSES   Final diagnoses:  None     Rx / DC Orders   ED Discharge Orders     None        Note:  This document was prepared using Dragon voice recognition software and may include unintentional dictation errors.

## 2022-01-04 ENCOUNTER — Emergency Department: Payer: Medicare PPO

## 2022-01-04 ENCOUNTER — Other Ambulatory Visit: Payer: Self-pay

## 2022-01-04 LAB — COMPREHENSIVE METABOLIC PANEL
ALT: 12 U/L (ref 0–44)
AST: 30 U/L (ref 15–41)
Albumin: 4.4 g/dL (ref 3.5–5.0)
Alkaline Phosphatase: 70 U/L (ref 38–126)
Anion gap: 6 (ref 5–15)
BUN: 13 mg/dL (ref 8–23)
CO2: 23 mmol/L (ref 22–32)
Calcium: 9.7 mg/dL (ref 8.9–10.3)
Chloride: 110 mmol/L (ref 98–111)
Creatinine, Ser: 1.01 mg/dL — ABNORMAL HIGH (ref 0.44–1.00)
GFR, Estimated: >60 mL/min (ref >60)
Glucose, Bld: 111 mg/dL — ABNORMAL HIGH (ref 70–99)
Potassium: 4 mmol/L (ref 3.5–5.1)
Sodium: 139 mmol/L (ref 135–145)
Total Bilirubin: 0.7 mg/dL (ref 0.3–1.2)
Total Protein: 7.6 g/dL (ref 6.5–8.1)

## 2022-01-04 LAB — CBC WITH DIFFERENTIAL/PLATELET
Abs Immature Granulocytes: 0.03 10*3/uL (ref 0.00–0.07)
Basophils Absolute: 0 10*3/uL (ref 0.0–0.1)
Basophils Relative: 0 %
Eosinophils Absolute: 0.1 10*3/uL (ref 0.0–0.5)
Eosinophils Relative: 1 %
HCT: 39 % (ref 36.0–46.0)
Hemoglobin: 11.7 g/dL — ABNORMAL LOW (ref 12.0–15.0)
Immature Granulocytes: 0 %
Lymphocytes Relative: 22 %
Lymphs Abs: 1.7 10*3/uL (ref 0.7–4.0)
MCH: 27.2 pg (ref 26.0–34.0)
MCHC: 30 g/dL (ref 30.0–36.0)
MCV: 90.7 fL (ref 80.0–100.0)
Monocytes Absolute: 0.5 10*3/uL (ref 0.1–1.0)
Monocytes Relative: 7 %
Neutro Abs: 5.2 10*3/uL (ref 1.7–7.7)
Neutrophils Relative %: 70 %
Platelets: 213 10*3/uL (ref 150–400)
RBC: 4.3 MIL/uL (ref 3.87–5.11)
RDW: 18.9 % — ABNORMAL HIGH (ref 11.5–15.5)
WBC: 7.5 10*3/uL (ref 4.0–10.5)
nRBC: 0 % (ref 0.0–0.2)

## 2022-01-04 LAB — TROPONIN I (HIGH SENSITIVITY)
Troponin I (High Sensitivity): 3 ng/L (ref <18)
Troponin I (High Sensitivity): 4 ng/L (ref <18)

## 2022-01-04 LAB — CK: Total CK: 62 U/L (ref 38–234)

## 2022-01-04 MED ORDER — IOHEXOL 350 MG/ML SOLN
75.0000 mL | Freq: Once | INTRAVENOUS | Status: AC | PRN
  Administered 2022-01-04: 75 mL via INTRAVENOUS

## 2022-01-04 NOTE — ED Notes (Signed)
IV team at bedside 

## 2022-01-04 NOTE — ED Notes (Signed)
Pt returned from radiology.

## 2022-01-04 NOTE — Discharge Instructions (Signed)
Please elevate left leg even while sleeping.  Return to the ER for worsening symptoms, persistent vomiting, difficulty breathing or other concerns.

## 2022-01-19 ENCOUNTER — Telehealth: Payer: Self-pay | Admitting: Gastroenterology

## 2022-01-19 MED ORDER — DEXLANSOPRAZOLE 60 MG PO CPDR: 60.0000 mg | DELAYED_RELEASE_CAPSULE | Freq: Every day | ORAL | 3 refills | Status: DC

## 2022-01-19 NOTE — Addendum Note (Signed)
Addended by: Roena Malady on: 01/19/2022 03:27 PM   Modules accepted: Orders

## 2022-01-19 NOTE — Telephone Encounter (Signed)
PT call need refill on med. [phone]

## 2022-01-19 NOTE — Telephone Encounter (Signed)
Rx sent through e-scribe Pt is aware  

## 2022-02-05 ENCOUNTER — Emergency Department
Admission: EM | Admit: 2022-02-05 | Discharge: 2022-02-06 | Disposition: A | Discharge status: Home / Self Care | Payer: Medicare PPO | Attending: Emergency Medicine | Admitting: Emergency Medicine

## 2022-02-05 ENCOUNTER — Encounter: Payer: Self-pay | Admitting: Emergency Medicine

## 2022-02-05 ENCOUNTER — Other Ambulatory Visit: Payer: Self-pay

## 2022-02-05 ENCOUNTER — Emergency Department: Payer: Medicare PPO

## 2022-02-05 DIAGNOSIS — M542 Cervicalgia: Secondary | ICD-10-CM | POA: Insufficient documentation

## 2022-02-05 DIAGNOSIS — J029 Acute pharyngitis, unspecified: Secondary | ICD-10-CM | POA: Diagnosis not present

## 2022-02-05 DIAGNOSIS — N189 Chronic kidney disease, unspecified: Secondary | ICD-10-CM | POA: Insufficient documentation

## 2022-02-05 DIAGNOSIS — R0602 Shortness of breath: Secondary | ICD-10-CM | POA: Insufficient documentation

## 2022-02-05 DIAGNOSIS — R5383 Other fatigue: Secondary | ICD-10-CM | POA: Insufficient documentation

## 2022-02-05 DIAGNOSIS — R0600 Dyspnea, unspecified: Secondary | ICD-10-CM

## 2022-02-05 DIAGNOSIS — Z20822 Contact with and (suspected) exposure to covid-19: Secondary | ICD-10-CM | POA: Diagnosis not present

## 2022-02-05 DIAGNOSIS — R059 Cough, unspecified: Secondary | ICD-10-CM | POA: Diagnosis not present

## 2022-02-05 DIAGNOSIS — M3500 Sicca syndrome, unspecified: Secondary | ICD-10-CM | POA: Diagnosis not present

## 2022-02-05 LAB — CBC WITH DIFFERENTIAL/PLATELET
Abs Immature Granulocytes: 0.02 10*3/uL (ref 0.00–0.07)
Basophils Absolute: 0 10*3/uL (ref 0.0–0.1)
Basophils Relative: 1 %
Eosinophils Absolute: 0.1 10*3/uL (ref 0.0–0.5)
Eosinophils Relative: 2 %
HCT: 39.1 % (ref 36.0–46.0)
Hemoglobin: 12.2 g/dL (ref 12.0–15.0)
Immature Granulocytes: 1 %
Lymphocytes Relative: 35 %
Lymphs Abs: 1.4 10*3/uL (ref 0.7–4.0)
MCH: 27.1 pg (ref 26.0–34.0)
MCHC: 31.2 g/dL (ref 30.0–36.0)
MCV: 86.9 fL (ref 80.0–100.0)
Monocytes Absolute: 0.3 10*3/uL (ref 0.1–1.0)
Monocytes Relative: 8 %
Neutro Abs: 2.1 10*3/uL (ref 1.7–7.7)
Neutrophils Relative %: 53 %
Platelets: 295 10*3/uL (ref 150–400)
RBC: 4.5 MIL/uL (ref 3.87–5.11)
RDW: 19.3 % — ABNORMAL HIGH (ref 11.5–15.5)
WBC: 4 10*3/uL (ref 4.0–10.5)
nRBC: 0 % (ref 0.0–0.2)

## 2022-02-05 LAB — HEPATIC FUNCTION PANEL
ALT: 14 U/L (ref 0–44)
AST: 20 U/L (ref 15–41)
Albumin: 4.1 g/dL (ref 3.5–5.0)
Alkaline Phosphatase: 65 U/L (ref 38–126)
Bilirubin, Direct: <0.1 mg/dL (ref 0.0–0.2)
Total Bilirubin: 0.5 mg/dL (ref 0.3–1.2)
Total Protein: 7.3 g/dL (ref 6.5–8.1)

## 2022-02-05 LAB — CBC
HCT: 39.4 % (ref 36.0–46.0)
Hemoglobin: 12.2 g/dL (ref 12.0–15.0)
MCH: 26.9 pg (ref 26.0–34.0)
MCHC: 31 g/dL (ref 30.0–36.0)
MCV: 86.8 fL (ref 80.0–100.0)
Platelets: 274 10*3/uL (ref 150–400)
RBC: 4.54 MIL/uL (ref 3.87–5.11)
RDW: 19.3 % — ABNORMAL HIGH (ref 11.5–15.5)
WBC: 4 10*3/uL (ref 4.0–10.5)
nRBC: 0 % (ref 0.0–0.2)

## 2022-02-05 LAB — PROCALCITONIN: Procalcitonin: <0.1 ng/mL

## 2022-02-05 LAB — BASIC METABOLIC PANEL
Anion gap: 7 (ref 5–15)
BUN: 10 mg/dL (ref 8–23)
CO2: 24 mmol/L (ref 22–32)
Calcium: 9.7 mg/dL (ref 8.9–10.3)
Chloride: 111 mmol/L (ref 98–111)
Creatinine, Ser: 1.03 mg/dL — ABNORMAL HIGH (ref 0.44–1.00)
GFR, Estimated: >60 mL/min (ref >60)
Glucose, Bld: 104 mg/dL — ABNORMAL HIGH (ref 70–99)
Potassium: 3.8 mmol/L (ref 3.5–5.1)
Sodium: 142 mmol/L (ref 135–145)

## 2022-02-05 LAB — TROPONIN I (HIGH SENSITIVITY): Troponin I (High Sensitivity): 4 ng/L (ref <18)

## 2022-02-05 LAB — SEDIMENTATION RATE: Sed Rate: 5 mm/hr (ref 0–30)

## 2022-02-05 LAB — RESP PANEL BY RT-PCR (FLU A&B, COVID) ARPGX2
Influenza A by PCR: NEGATIVE
Influenza B by PCR: NEGATIVE
SARS Coronavirus 2 by RT PCR: NEGATIVE

## 2022-02-05 LAB — TSH: TSH: 1.551 u[IU]/mL (ref 0.350–4.500)

## 2022-02-05 LAB — MAGNESIUM: Magnesium: 1.9 mg/dL (ref 1.7–2.4)

## 2022-02-05 LAB — D-DIMER, QUANTITATIVE: D-Dimer, Quant: 0.32 ug/mL-FEU (ref 0.00–0.50)

## 2022-02-05 LAB — BRAIN NATRIURETIC PEPTIDE: B Natriuretic Peptide: 25.4 pg/mL (ref 0.0–100.0)

## 2022-02-05 MED ORDER — ACETAMINOPHEN 500 MG PO TABS
1000.0000 mg | ORAL_TABLET | Freq: Once | ORAL | Status: AC
  Administered 2022-02-05: 1000 mg via ORAL
  Filled 2022-02-05: qty 2

## 2022-02-05 NOTE — ED Notes (Addendum)
Pt walking pulse and O2 maintained at 86-89 rate and 98%

## 2022-02-05 NOTE — ED Triage Notes (Signed)
Taking doxi and had an antibiotic shot for ?abscess to left posterior ankle.  Now c/o worsening feeling of SOB, sore throat, fatigue x 1 week.  Home COVID test was negative.

## 2022-02-05 NOTE — ED Provider Notes (Signed)
[facility] Provider Note    Event Date/Time   First MD Initiated Contact with Patient 02/05/22 1751     (approximate)   History   Shortness of Breath   HPI  Amy Huynh is a 66 y.o. female who reports she was on doxycycline and had an antibiotic shot for a possible abscess or infection on the left ankle.  This happened on the 20th.  Since then she has been feeling tired and sleepy.  She has been having shortness of breath and she has a nonproductive dry cough has some pain coming up the leg from where the infection was.  She also complains of a fairly bad sore throat.      Physical Exam   Triage Vital Signs: ED Triage Vitals  Enc Vitals Group     BP 02/05/22 1638 125/80     Pulse Rate 02/05/22 1636 71     Resp 02/05/22 1636 18     Temp 02/05/22 1636 98 F (36.7 C)     Temp Source 02/05/22 1636 Oral     SpO2 02/05/22 1636 94 %     Weight 02/05/22 1635 169 lb 1.5 oz (76.7 kg)     Height 02/05/22 1635 5' 2.5" (1.588 m)     Head Circumference --      Peak Flow --      Pain Score 02/05/22 1635 0     Pain Loc --      Pain Edu? --      Excl. in [place]? --     Most recent vital signs: Vitals:   02/05/22 2200 02/05/22 2230  BP: 120/68 (!) 104/50  Pulse: 72 66  Resp:  16  Temp:  98.3 F (36.8 C)  SpO2: 97% 97%     General: Awake, no distress.  Head normocephalic atraumatic Mouth no erythema or exudate Neck is supple I do not palpate any nodes anteriorly where the sore throat is CV:  Good peripheral perfusion.  Heart regular rate and rhythm no audible murmurs Resp:  Normal effort.  Lungs are clear Abd:  No distention.  Soft and nontender Extremities: Possible slight trace of edema.  The area where the infection was looks like it is coming down nicely there is no marked erythema or tenderness or swelling there.   ED Results / Procedures / Treatments   Labs (all labs ordered are listed, but only abnormal results are  displayed) Labs Reviewed  BASIC METABOLIC PANEL - Abnormal; Notable for the following components:      Result Value   Glucose, Bld 104 (*)    Creatinine, Ser 1.03 (*)    All other components within normal limits  CBC - Abnormal; Notable for the following components:   RDW 19.3 (*)    All other components within normal limits  CBC WITH DIFFERENTIAL/PLATELET - Abnormal; Notable for the following components:   RDW 19.3 (*)    All other components within normal limits  RESP PANEL BY RT-PCR (FLU A&B, COVID) ARPGX2  PROCALCITONIN  HEPATIC FUNCTION PANEL  D-DIMER, QUANTITATIVE  BRAIN NATRIURETIC PEPTIDE  TSH  MAGNESIUM  SEDIMENTATION RATE  VITAMIN B12  TROPONIN I (HIGH SENSITIVITY)     EKG EKG read and interpreted by me shows normal sinus rhythm rate of 72 normal axis no acute ST-T wave changes possibly some increased R wave progression.    RADIOLOGY Chest x-ray read by radiology reviewed and interpreted by me shows no acute disease   PROCEDURES:  Critical Care performed:   Procedures   MEDICATIONS ORDERED IN ED: Medications  acetaminophen (TYLENOL) tablet 1,000 mg (1,000 mg Oral Given 02/05/22 2102)     IMPRESSION / MDM / ASSESSMENT AND PLAN / ED COURSE  I reviewed the triage vital signs and the nursing notes. Since she is tired and sleepy and somewhat short of breath with what seems to be a trace of edema bilaterally we will check a BNP.  We will check a TSH additionally especially since she has some anterior neck pain.  We will check a troponin and a D-dimer as well. Differential diagnosis includes, but is not limited to, differential diagnosis would include anything that would make her short of breath including COVID the flu a PE heart trouble like ischemia or failure.  Pneumonia is also a possibility although there is nothing on the chest x-ray.  Patient's presentation is most consistent with acute complicated illness / injury requiring diagnostic workup.  The  patient is on the cardiac monitor to evaluate for evidence of arrhythmia and/or significant heart rate changes.  None have been seen ----------------------------------------- 11:26 PM on 02/05/2022 -----------------------------------------   Patient with a history of Sjogren syndrome chronic kidney disease bradycardia history of pneumonia family history of ovarian cancer comes in complaining of feeling sleepy weak and tired and short of breath.  She is able to ambulate without becoming tachycardic or desaturating.  Her electrolytes and liver functions are normal.  Her CBC and differential are normal.  Her procalcitonin is negative.  Her sed rate is only 5.  COVID and flu are negative.  TSH is normal.  D-dimer and BNP are negative.  Her troponin is negative.  I am really uncertain as to what is causing her symptoms.  I cannot think of anything else that I can order tonight.  I will have her follow-up ASAP with either her primary care doctor or [facility] clinic walk-in.   FINAL CLINICAL IMPRESSION(S) / ED DIAGNOSES   Final diagnoses:  Dyspnea, unspecified type     Rx / DC Orders   ED Discharge Orders     None        Note:  This document was prepared using Dragon voice recognition software and may include unintentional dictation errors.   Nena Polio, MD 02/05/22 2328

## 2022-02-05 NOTE — Discharge Instructions (Addendum)
I am uncertain why you were tired and sleepy and having shortness of breath.  I have checked everything that I can think of including for blood clots and heart trouble and congestive heart failure and infection and nothing is turned up.  Your sedimentation rate is 5.  A CT angio that you had done last month did not show anything.  I really do not have any other tests I can order tonight.  I would ask you to follow-up with your regular doctor or the walk-in clinic at [facility] clinic to get a further evaluation.  Possibly you will have to see pulmonary.  Also the B12 shot may help a good deal.

## 2022-02-06 LAB — VITAMIN B12: Vitamin B-12: 1784 pg/mL — ABNORMAL HIGH (ref 180–914)

## 2022-03-10 ENCOUNTER — Inpatient Hospital Stay
Admission: EM | Admit: 2022-03-10 | Discharge: 2022-03-15 | DRG: 392 | Disposition: A | Discharge status: Home / Self Care | Payer: Medicare PPO | Attending: Surgery | Admitting: Surgery

## 2022-03-10 ENCOUNTER — Other Ambulatory Visit: Payer: Self-pay

## 2022-03-10 ENCOUNTER — Emergency Department: Payer: Medicare PPO

## 2022-03-10 DIAGNOSIS — Z881 Allergy status to other antibiotic agents status: Secondary | ICD-10-CM

## 2022-03-10 DIAGNOSIS — Z885 Allergy status to narcotic agent status: Secondary | ICD-10-CM

## 2022-03-10 DIAGNOSIS — M797 Fibromyalgia: Secondary | ICD-10-CM | POA: Diagnosis present

## 2022-03-10 DIAGNOSIS — M3500 Sicca syndrome, unspecified: Secondary | ICD-10-CM | POA: Diagnosis present

## 2022-03-10 DIAGNOSIS — R531 Weakness: Secondary | ICD-10-CM | POA: Diagnosis present

## 2022-03-10 DIAGNOSIS — Z96652 Presence of left artificial knee joint: Secondary | ICD-10-CM | POA: Diagnosis present

## 2022-03-10 DIAGNOSIS — Z79899 Other long term (current) drug therapy: Secondary | ICD-10-CM

## 2022-03-10 DIAGNOSIS — K562 Volvulus: Secondary | ICD-10-CM

## 2022-03-10 DIAGNOSIS — K253 Acute gastric ulcer without hemorrhage or perforation: Secondary | ICD-10-CM

## 2022-03-10 DIAGNOSIS — R1900 Intra-abdominal and pelvic swelling, mass and lump, unspecified site: Principal | ICD-10-CM | POA: Diagnosis present

## 2022-03-10 DIAGNOSIS — Q433 Congenital malformations of intestinal fixation: Secondary | ICD-10-CM | POA: Diagnosis not present

## 2022-03-10 DIAGNOSIS — R5383 Other fatigue: Secondary | ICD-10-CM

## 2022-03-10 DIAGNOSIS — R1013 Epigastric pain: Secondary | ICD-10-CM

## 2022-03-10 DIAGNOSIS — Z888 Allergy status to other drugs, medicaments and biological substances status: Secondary | ICD-10-CM

## 2022-03-10 DIAGNOSIS — I959 Hypotension, unspecified: Secondary | ICD-10-CM | POA: Diagnosis present

## 2022-03-10 LAB — CBC
HCT: 36.8 % (ref 36.0–46.0)
Hemoglobin: 11.4 g/dL — ABNORMAL LOW (ref 12.0–15.0)
MCH: 26.6 pg (ref 26.0–34.0)
MCHC: 31 g/dL (ref 30.0–36.0)
MCV: 86 fL (ref 80.0–100.0)
Platelets: 246 10*3/uL (ref 150–400)
RBC: 4.28 MIL/uL (ref 3.87–5.11)
RDW: 17.5 % — ABNORMAL HIGH (ref 11.5–15.5)
WBC: 5.2 10*3/uL (ref 4.0–10.5)
nRBC: 0 % (ref 0.0–0.2)

## 2022-03-10 LAB — COMPREHENSIVE METABOLIC PANEL
ALT: 11 U/L (ref 0–44)
AST: 18 U/L (ref 15–41)
Albumin: 3.6 g/dL (ref 3.5–5.0)
Alkaline Phosphatase: 66 U/L (ref 38–126)
Anion gap: 4 — ABNORMAL LOW (ref 5–15)
BUN: 11 mg/dL (ref 8–23)
CO2: 24 mmol/L (ref 22–32)
Calcium: 8.7 mg/dL — ABNORMAL LOW (ref 8.9–10.3)
Chloride: 112 mmol/L — ABNORMAL HIGH (ref 98–111)
Creatinine, Ser: 1.12 mg/dL — ABNORMAL HIGH (ref 0.44–1.00)
GFR, Estimated: 55 mL/min — ABNORMAL LOW (ref >60)
Glucose, Bld: 127 mg/dL — ABNORMAL HIGH (ref 70–99)
Potassium: 3.4 mmol/L — ABNORMAL LOW (ref 3.5–5.1)
Sodium: 140 mmol/L (ref 135–145)
Total Bilirubin: 0.6 mg/dL (ref 0.3–1.2)
Total Protein: 6.6 g/dL (ref 6.5–8.1)

## 2022-03-10 LAB — TROPONIN I (HIGH SENSITIVITY): Troponin I (High Sensitivity): 4 ng/L (ref <18)

## 2022-03-10 LAB — LIPASE, BLOOD: Lipase: 56 U/L — ABNORMAL HIGH (ref 11–51)

## 2022-03-10 MED ORDER — IOHEXOL 300 MG/ML  SOLN
100.0000 mL | Freq: Once | INTRAMUSCULAR | Status: AC | PRN
  Administered 2022-03-11: 100 mL via INTRAVENOUS

## 2022-03-10 NOTE — ED Provider Notes (Incomplete)
[facility] Provider Note    Event Date/Time   First MD Initiated Contact with Patient 03/10/22 2300     (approximate)   History   Weakness and Hypotension   HPI  Amy Huynh is a 66 y.o. female   Past medical history of fibromyalgia, arthritis, neuropathy, Sjogren's, who presents to the emergency department today with low blood pressure taken from home reportedly [id number] to [id number] systolic normally [id number] 10 systolic and a sensation of generalized weakness and fatigue over the last 3 to 4 days.  She denies respiratory infectious symptoms like shortness of breath, cough, fever or chills.  She does have some mild epigastric pain and loose stools over the last several days.  Urinary frequency as well.  No dysuria.  No bleeding.  History was obtained via the patient Independent historian includes spouse who is at bedside providing history as well.      Physical Exam   Triage Vital Signs: ED Triage Vitals  Enc Vitals Group     BP 03/10/22 2246 116/74     Pulse Rate 03/10/22 2246 93     Resp 03/10/22 2246 15     Temp 03/10/22 2246 97.7 F (36.5 C)     Temp Source 03/10/22 2246 Oral     SpO2 03/10/22 2246 98 %     Weight 03/10/22 2242 175 lb (79.4 kg)     Height 03/10/22 2242 5\' 2"  (1.575 m)     Head Circumference --      Peak Flow --      Pain Score 03/10/22 2242 4     Pain Loc --      Pain Edu? --      Excl. in [place]? --     Most recent vital signs: Vitals:   03/10/22 2246  BP: 116/74  Pulse: 93  Resp: 15  Temp: 97.7 F (36.5 C)  SpO2: 98%    General: Awake, no distress.  CV:  Good peripheral perfusion.  Resp:  Normal effort.  Abd:  No distention.  Soft, no rigidity or guarding.  Reports mild pain in the epigastrium. Other:  Nontoxic-appearing, normal hemodynamics including blood pressure 116/74 and afebrile.  Oxygen saturation 98% on room air.   ED Results / Procedures / Treatments   Labs (all labs ordered are listed, but only  abnormal results are displayed) Labs Reviewed  LIPASE, BLOOD - Abnormal; Notable for the following components:      Result Value   Lipase 56 (*)    All other components within normal limits  COMPREHENSIVE METABOLIC PANEL - Abnormal; Notable for the following components:   Potassium 3.4 (*)    Chloride 112 (*)    Glucose, Bld 127 (*)    Creatinine, Ser 1.12 (*)    Calcium 8.7 (*)    GFR, Estimated 55 (*)    Anion gap 4 (*)    All other components within normal limits  CBC - Abnormal; Notable for the following components:   Hemoglobin 11.4 (*)    RDW 17.5 (*)    All other components within normal limits  URINALYSIS, ROUTINE W REFLEX MICROSCOPIC - Abnormal; Notable for the following components:   Color, Urine YELLOW (*)    APPearance HAZY (*)    Hgb urine dipstick SMALL (*)    Leukocytes,Ua MODERATE (*)    All other components within normal limits  TROPONIN I (HIGH SENSITIVITY)  TROPONIN I (HIGH SENSITIVITY)     I reviewed labs and  they are notable for a hemoglobin of 11.4, slightly below range of normal though at baseline for this patient impaired to her priors.  EKG  ED ECG REPORT I, Lucillie Garfinkel, the attending physician, personally viewed and interpreted this ECG.   Date: 03/10/2022  EKG Time: 2252  Rate: 87  Rhythm: normal EKG, normal sinus rhythm  Axis: normal  Intervals:none  ST&T Change: no stemi    RADIOLOGY I independently reviewed and interpreted CXR and I see no focal opacities or pneumothorax   PROCEDURES:  Critical Care performed: yes  .Critical Care  Performed by: Lucillie Garfinkel, MD Authorized by: Lucillie Garfinkel, MD   Critical care provider statement:    Critical care time (minutes):  30   Critical care was necessary to treat or prevent imminent or life-threatening deterioration of the following conditions: surgical abdomen; malrotation w signs of ischemic bowel requiring emergent surgical intervention.   Critical care was time spent personally by me  on the following activities:  Development of treatment plan with patient or surrogate, discussions with consultants, evaluation of patient's response to treatment, examination of patient, ordering and review of laboratory studies, ordering and review of radiographic studies, ordering and performing treatments and interventions, pulse oximetry, re-evaluation of patient's condition and review of old charts   Care discussed with: admitting provider      [place] ED: Medications  0.9 %  sodium chloride infusion ( Intravenous New Bag/Given 03/11/22 0150)  ceFAZolin (ANCEF) IVPB 2g/100 mL premix (has no administration in time range)  ceFAZolin (ANCEF) 2-4 GM/100ML-% IVPB (has no administration in time range)  iohexol (OMNIPAQUE) 300 MG/ML solution 100 mL (100 mLs Intravenous Contrast Given 03/11/22 0002)       I spoke with radiologist regarding the findings on her CT scan which are suggestive of some element of malrotation and inflammatory changes in the left upper quadrant.  I spoke with on-call surgeon Dr. Lysle Pearl about these findings, and for care plan.  IMPRESSION / MDM / ASSESSMENT AND PLAN / ED COURSE  I reviewed the triage vital signs and the nursing notes.                              Differential diagnosis includes, but is not limited to, infection, respiratory infection, urinary tract infection, intra-abdominal infection, dehydration or metabolic derangement   The patient is on the cardiac monitor to evaluate for evidence of arrhythmia and/or significant heart rate changes.  MDM: Patient overall well-appearing, with normal hemodynamics with a chief complaint of hypotension at home, and on review of systems generalized weakness and fatigue with some loose stools and epigastric pain along with urinary frequency.  Triage note noted some shortness of breath though the patient states this is chronic and unchanged and she has no other respiratory infectious symptoms like chest  pain, acute shortness of breath or cough and denies other viral upper respiratory infectious symptoms like congestion or sore throat.  Given her abdominal pain and mild tenderness to palpation, will obtain a CT scan of the abdomen pelvis to rule intra-abdominal infection.  She is overall well-appearing with benign exam -- infectious and basic labs, EKG and troponin for infection or cardiac etiologies.  I received a call from the radiologist regarding findings of some malrotation of her small bowels in the left upper quadrant without frank obstruction but with some inflammatory changes, concerning for mesenteric ischemia. I called the on-call surgeon Dr. Lysle Pearl to relay these  findings, 2:32 AM, dispo pending his recommendations.  2:32 AM Spoke with Dr Lysle Pearl; plan to admit for OR diagnostic lap.   Patient's presentation is most consistent with acute presentation with potential threat to life or bodily function.       FINAL CLINICAL IMPRESSION(S) / ED DIAGNOSES   Final diagnoses:  Generalized weakness  Other fatigue  Malrotation of intestine  Epigastric pain     Rx / DC Orders   ED Discharge Orders     None        Note:  This document was prepared using Dragon voice recognition software and may include unintentional dictation errors.    Lucillie Garfinkel, MD 03/11/22 Howell Pringle    Lucillie Garfinkel, MD 03/11/22 [place], Rayne, MD 03/11/22 [phone]

## 2022-03-10 NOTE — ED Triage Notes (Addendum)
Pt comes from home via POV with c/o low blood pressure and weakness. Pt states at home her blood pressure has been ranging from systolic of 41S- 23T for 3-4 days. States this has happened before. Pt also reports having SOB and abd pain. Denies CP.

## 2022-03-11 ENCOUNTER — Encounter
Admission: EM | Disposition: A | Discharge status: Home / Self Care | Payer: Self-pay | Source: Home / Self Care | Attending: Surgery

## 2022-03-11 ENCOUNTER — Other Ambulatory Visit: Payer: Self-pay

## 2022-03-11 ENCOUNTER — Encounter: Payer: Self-pay | Admitting: Surgery

## 2022-03-11 ENCOUNTER — Inpatient Hospital Stay: Payer: Medicare PPO | Admitting: Anesthesiology

## 2022-03-11 DIAGNOSIS — K562 Volvulus: Secondary | ICD-10-CM | POA: Diagnosis present

## 2022-03-11 DIAGNOSIS — Z888 Allergy status to other drugs, medicaments and biological substances status: Secondary | ICD-10-CM | POA: Diagnosis not present

## 2022-03-11 DIAGNOSIS — R531 Weakness: Secondary | ICD-10-CM | POA: Diagnosis present

## 2022-03-11 DIAGNOSIS — Z79899 Other long term (current) drug therapy: Secondary | ICD-10-CM | POA: Diagnosis not present

## 2022-03-11 DIAGNOSIS — R1900 Intra-abdominal and pelvic swelling, mass and lump, unspecified site: Secondary | ICD-10-CM | POA: Diagnosis present

## 2022-03-11 DIAGNOSIS — Z881 Allergy status to other antibiotic agents status: Secondary | ICD-10-CM | POA: Diagnosis not present

## 2022-03-11 DIAGNOSIS — Z885 Allergy status to narcotic agent status: Secondary | ICD-10-CM | POA: Diagnosis not present

## 2022-03-11 DIAGNOSIS — Q433 Congenital malformations of intestinal fixation: Secondary | ICD-10-CM | POA: Diagnosis present

## 2022-03-11 DIAGNOSIS — M3500 Sicca syndrome, unspecified: Secondary | ICD-10-CM | POA: Diagnosis present

## 2022-03-11 DIAGNOSIS — I959 Hypotension, unspecified: Secondary | ICD-10-CM | POA: Diagnosis present

## 2022-03-11 DIAGNOSIS — M797 Fibromyalgia: Secondary | ICD-10-CM | POA: Diagnosis present

## 2022-03-11 DIAGNOSIS — Z96652 Presence of left artificial knee joint: Secondary | ICD-10-CM | POA: Diagnosis present

## 2022-03-11 HISTORY — PX: LAPAROTOMY: SHX154

## 2022-03-11 LAB — CBC
HCT: 38.7 % (ref 36.0–46.0)
Hemoglobin: 11.7 g/dL — ABNORMAL LOW (ref 12.0–15.0)
MCH: 26.5 pg (ref 26.0–34.0)
MCHC: 30.2 g/dL (ref 30.0–36.0)
MCV: 87.6 fL (ref 80.0–100.0)
Platelets: 237 10*3/uL (ref 150–400)
RBC: 4.42 MIL/uL (ref 3.87–5.11)
RDW: 17.6 % — ABNORMAL HIGH (ref 11.5–15.5)
WBC: 10.1 10*3/uL (ref 4.0–10.5)
nRBC: 0 % (ref 0.0–0.2)

## 2022-03-11 LAB — URINALYSIS, ROUTINE W REFLEX MICROSCOPIC
Bacteria, UA: NONE SEEN
Bilirubin Urine: NEGATIVE
Glucose, UA: NEGATIVE mg/dL
Ketones, ur: NEGATIVE mg/dL
Nitrite: NEGATIVE
Protein, ur: NEGATIVE mg/dL
Specific Gravity, Urine: 1.024 (ref 1.005–1.030)
pH: 5 (ref 5.0–8.0)

## 2022-03-11 LAB — HIV ANTIBODY (ROUTINE TESTING W REFLEX): HIV Screen 4th Generation wRfx: NONREACTIVE

## 2022-03-11 LAB — CREATININE, SERUM
Creatinine, Ser: 1.08 mg/dL — ABNORMAL HIGH (ref 0.44–1.00)
GFR, Estimated: 57 mL/min — ABNORMAL LOW (ref >60)

## 2022-03-11 LAB — TROPONIN I (HIGH SENSITIVITY): Troponin I (High Sensitivity): 4 ng/L (ref <18)

## 2022-03-11 SURGERY — LAPAROTOMY, EXPLORATORY
Anesthesia: General | Site: Abdomen

## 2022-03-11 MED ORDER — SEVOFLURANE IN SOLN
RESPIRATORY_TRACT | Status: AC
  Filled 2022-03-11: qty 250

## 2022-03-11 MED ORDER — PILOCARPINE HCL 5 MG PO TABS
5.0000 mg | ORAL_TABLET | Freq: Three times a day (TID) | ORAL | Status: DC
  Administered 2022-03-11 – 2022-03-13 (×4): 5 mg via ORAL
  Filled 2022-03-11 (×8): qty 1

## 2022-03-11 MED ORDER — HYDROMORPHONE HCL 1 MG/ML IJ SOLN
0.2500 mg | INTRAMUSCULAR | Status: DC | PRN
  Administered 2022-03-11: 0.5 mg via INTRAVENOUS
  Administered 2022-03-11: 0.25 mg via INTRAVENOUS

## 2022-03-11 MED ORDER — HYDROMORPHONE HCL 1 MG/ML IJ SOLN
INTRAMUSCULAR | Status: AC
  Administered 2022-03-11: 0.25 mg via INTRAVENOUS
  Filled 2022-03-11: qty 1

## 2022-03-11 MED ORDER — TOPIRAMATE 100 MG PO TABS
200.0000 mg | ORAL_TABLET | Freq: Every day | ORAL | Status: DC
  Administered 2022-03-11: 200 mg via ORAL
  Filled 2022-03-11: qty 2

## 2022-03-11 MED ORDER — CEFAZOLIN SODIUM-DEXTROSE 2-4 GM/100ML-% IV SOLN
INTRAVENOUS | Status: AC
  Filled 2022-03-11: qty 100

## 2022-03-11 MED ORDER — ACETAMINOPHEN 325 MG PO TABS
650.0000 mg | ORAL_TABLET | Freq: Four times a day (QID) | ORAL | Status: DC | PRN
  Administered 2022-03-12 – 2022-03-13 (×2): 650 mg via ORAL
  Filled 2022-03-11 (×2): qty 2

## 2022-03-11 MED ORDER — METHOCARBAMOL 500 MG PO TABS
500.0000 mg | ORAL_TABLET | Freq: Three times a day (TID) | ORAL | Status: DC
  Administered 2022-03-11 – 2022-03-15 (×12): 500 mg via ORAL
  Filled 2022-03-11 (×13): qty 1

## 2022-03-11 MED ORDER — ONDANSETRON HCL 4 MG/2ML IJ SOLN
4.0000 mg | Freq: Four times a day (QID) | INTRAMUSCULAR | Status: DC | PRN
  Administered 2022-03-11 – 2022-03-13 (×2): 4 mg via INTRAVENOUS
  Filled 2022-03-11 (×2): qty 2

## 2022-03-11 MED ORDER — DOCUSATE SODIUM 100 MG PO CAPS: 100.0000 mg | ORAL_CAPSULE | Freq: Two times a day (BID) | ORAL | Status: DC | PRN

## 2022-03-11 MED ORDER — DEXAMETHASONE SODIUM PHOSPHATE 10 MG/ML IJ SOLN
INTRAMUSCULAR | Status: AC
  Filled 2022-03-11: qty 1

## 2022-03-11 MED ORDER — ROCURONIUM BROMIDE 100 MG/10ML IV SOLN
INTRAVENOUS | Status: DC | PRN
  Administered 2022-03-11: 40 mg via INTRAVENOUS

## 2022-03-11 MED ORDER — BUTALBITAL-APAP-CAFFEINE 50-325-40 MG PO TABS: 1.0000 | ORAL_TABLET | Freq: Four times a day (QID) | ORAL | Status: DC | PRN

## 2022-03-11 MED ORDER — EPHEDRINE SULFATE (PRESSORS) 50 MG/ML IJ SOLN
INTRAMUSCULAR | Status: DC | PRN
  Administered 2022-03-11: 10 mg via INTRAVENOUS

## 2022-03-11 MED ORDER — HYDROMORPHONE HCL 1 MG/ML IJ SOLN
0.5000 mg | INTRAMUSCULAR | Status: DC | PRN
  Administered 2022-03-11 – 2022-03-13 (×12): 0.5 mg via INTRAVENOUS
  Filled 2022-03-11 (×13): qty 0.5

## 2022-03-11 MED ORDER — DEXAMETHASONE SODIUM PHOSPHATE 10 MG/ML IJ SOLN
INTRAMUSCULAR | Status: DC | PRN
  Administered 2022-03-11: 10 mg via INTRAVENOUS

## 2022-03-11 MED ORDER — TRAMADOL HCL 50 MG PO TABS
50.0000 mg | ORAL_TABLET | Freq: Four times a day (QID) | ORAL | Status: DC | PRN
  Administered 2022-03-11 – 2022-03-14 (×4): 50 mg via ORAL
  Filled 2022-03-11 (×8): qty 1

## 2022-03-11 MED ORDER — PROMETHAZINE HCL 25 MG PO TABS: 25.0000 mg | ORAL_TABLET | Freq: Four times a day (QID) | ORAL | Status: DC | PRN

## 2022-03-11 MED ORDER — ONDANSETRON HCL 4 MG/2ML IJ SOLN
INTRAMUSCULAR | Status: DC | PRN
  Administered 2022-03-11: 4 mg via INTRAVENOUS

## 2022-03-11 MED ORDER — SODIUM CHLORIDE (PF) 0.9 % IJ SOLN
INTRAMUSCULAR | Status: DC | PRN
  Administered 2022-03-11: 80 mL via INTRAMUSCULAR

## 2022-03-11 MED ORDER — MIDAZOLAM HCL 2 MG/2ML IJ SOLN
INTRAMUSCULAR | Status: AC
  Filled 2022-03-11: qty 2

## 2022-03-11 MED ORDER — SUMATRIPTAN SUCCINATE 50 MG PO TABS: 50.0000 mg | ORAL_TABLET | ORAL | Status: DC | PRN

## 2022-03-11 MED ORDER — SUGAMMADEX SODIUM 200 MG/2ML IV SOLN
INTRAVENOUS | Status: DC | PRN
  Administered 2022-03-11: 200 mg via INTRAVENOUS

## 2022-03-11 MED ORDER — PHENOL 1.4 % MT LIQD
1.0000 | OROMUCOSAL | Status: DC | PRN
  Administered 2022-03-11: 1 via OROMUCOSAL
  Filled 2022-03-11: qty 177

## 2022-03-11 MED ORDER — POTASSIUM CHLORIDE CRYS ER 20 MEQ PO TBCR
40.0000 meq | EXTENDED_RELEASE_TABLET | Freq: Once | ORAL | Status: AC
  Administered 2022-03-11: 40 meq via ORAL
  Filled 2022-03-11: qty 2

## 2022-03-11 MED ORDER — ONDANSETRON HCL 4 MG/2ML IJ SOLN
INTRAMUSCULAR | Status: AC
  Filled 2022-03-11: qty 2

## 2022-03-11 MED ORDER — HYDROXYZINE HCL 10 MG PO TABS
10.0000 mg | ORAL_TABLET | Freq: Three times a day (TID) | ORAL | Status: DC | PRN
  Administered 2022-03-12: 10 mg via ORAL
  Filled 2022-03-11 (×2): qty 1

## 2022-03-11 MED ORDER — ONDANSETRON 4 MG PO TBDP: 4.0000 mg | ORAL_TABLET | Freq: Four times a day (QID) | ORAL | Status: DC | PRN

## 2022-03-11 MED ORDER — HYOSCYAMINE SULFATE 0.125 MG PO TABS: 0.1250 mg | ORAL_TABLET | ORAL | Status: DC | PRN

## 2022-03-11 MED ORDER — CLOBETASOL PROPIONATE 0.05 % EX CREA
TOPICAL_CREAM | Freq: Two times a day (BID) | CUTANEOUS | Status: DC | PRN
  Filled 2022-03-11: qty 15

## 2022-03-11 MED ORDER — ACETAMINOPHEN 10 MG/ML IV SOLN: 1000.0000 mg | Freq: Once | INTRAVENOUS | Status: DC | PRN

## 2022-03-11 MED ORDER — BUPIVACAINE LIPOSOME 1.3 % IJ SUSP
INTRAMUSCULAR | Status: AC
  Filled 2022-03-11: qty 20

## 2022-03-11 MED ORDER — CEFAZOLIN SODIUM-DEXTROSE 2-4 GM/100ML-% IV SOLN
2.0000 g | Freq: Once | INTRAVENOUS | Status: AC
  Administered 2022-03-11: 2 g via INTRAVENOUS

## 2022-03-11 MED ORDER — BUPIVACAINE HCL (PF) 0.5 % IJ SOLN
INTRAMUSCULAR | Status: AC
  Filled 2022-03-11: qty 30

## 2022-03-11 MED ORDER — FENTANYL CITRATE (PF) 100 MCG/2ML IJ SOLN
INTRAMUSCULAR | Status: DC | PRN
  Administered 2022-03-11: 50 ug via INTRAVENOUS
  Administered 2022-03-11 (×2): 25 ug via INTRAVENOUS
  Administered 2022-03-11 (×2): 50 ug via INTRAVENOUS

## 2022-03-11 MED ORDER — LORATADINE 10 MG PO TABS
10.0000 mg | ORAL_TABLET | Freq: Every day | ORAL | Status: DC
  Administered 2022-03-12 – 2022-03-15 (×4): 10 mg via ORAL
  Filled 2022-03-11 (×5): qty 1

## 2022-03-11 MED ORDER — PROMETHAZINE HCL 25 MG/ML IJ SOLN
6.2500 mg | INTRAMUSCULAR | Status: DC | PRN
  Administered 2022-03-11: 6.25 mg via INTRAVENOUS

## 2022-03-11 MED ORDER — ENOXAPARIN SODIUM 40 MG/0.4ML IJ SOSY
0.5000 mg/kg | PREFILLED_SYRINGE | INTRAMUSCULAR | Status: DC
  Administered 2022-03-12 – 2022-03-15 (×4): 40 mg via SUBCUTANEOUS
  Filled 2022-03-11 (×4): qty 0.4

## 2022-03-11 MED ORDER — KETAMINE HCL 10 MG/ML IJ SOLN
INTRAMUSCULAR | Status: DC | PRN
  Administered 2022-03-11: 20 mg via INTRAVENOUS

## 2022-03-11 MED ORDER — BUPIVACAINE HCL (PF) 0.5 % IJ SOLN
INTRAMUSCULAR | Status: DC | PRN
  Administered 2022-03-11: 20 mL

## 2022-03-11 MED ORDER — FENTANYL CITRATE (PF) 100 MCG/2ML IJ SOLN
INTRAMUSCULAR | Status: AC
  Filled 2022-03-11: qty 2

## 2022-03-11 MED ORDER — ROPINIROLE HCL 1 MG PO TABS
2.0000 mg | ORAL_TABLET | Freq: Every day | ORAL | Status: DC
  Administered 2022-03-11 – 2022-03-14 (×4): 2 mg via ORAL
  Filled 2022-03-11 (×4): qty 2

## 2022-03-11 MED ORDER — PHENYLEPHRINE 80 MCG/ML (10ML) SYRINGE FOR IV PUSH (FOR BLOOD PRESSURE SUPPORT)
PREFILLED_SYRINGE | INTRAVENOUS | Status: AC
  Filled 2022-03-11: qty 10

## 2022-03-11 MED ORDER — PROPOFOL 10 MG/ML IV BOLUS
INTRAVENOUS | Status: DC | PRN
  Administered 2022-03-11: 140 mg via INTRAVENOUS

## 2022-03-11 MED ORDER — MIDAZOLAM HCL 2 MG/2ML IJ SOLN
INTRAMUSCULAR | Status: DC | PRN
  Administered 2022-03-11: 1 mg via INTRAVENOUS

## 2022-03-11 MED ORDER — PANTOPRAZOLE SODIUM 40 MG IV SOLR
40.0000 mg | Freq: Every day | INTRAVENOUS | Status: DC
  Administered 2022-03-11 – 2022-03-13 (×3): 40 mg via INTRAVENOUS
  Filled 2022-03-11 (×3): qty 10

## 2022-03-11 MED ORDER — ACETAMINOPHEN 10 MG/ML IV SOLN
INTRAVENOUS | Status: DC | PRN
  Administered 2022-03-11: 1000 mg via INTRAVENOUS

## 2022-03-11 MED ORDER — ACETAMINOPHEN 10 MG/ML IV SOLN: INTRAVENOUS | Status: DC | PRN

## 2022-03-11 MED ORDER — PROMETHAZINE HCL 25 MG/ML IJ SOLN
INTRAMUSCULAR | Status: AC
  Filled 2022-03-11: qty 1

## 2022-03-11 MED ORDER — LIDOCAINE HCL (CARDIAC) PF 100 MG/5ML IV SOSY
PREFILLED_SYRINGE | INTRAVENOUS | Status: DC | PRN
  Administered 2022-03-11: 100 mg via INTRAVENOUS

## 2022-03-11 MED ORDER — DROPERIDOL 2.5 MG/ML IJ SOLN: 0.6250 mg | Freq: Once | INTRAMUSCULAR | Status: DC | PRN

## 2022-03-11 MED ORDER — ENOXAPARIN SODIUM 40 MG/0.4ML IJ SOSY: 40.0000 mg | PREFILLED_SYRINGE | INTRAMUSCULAR | Status: DC

## 2022-03-11 MED ORDER — INFLUENZA VAC SPLIT QUAD 0.5 ML IM SUSY: 0.5000 mL | PREFILLED_SYRINGE | INTRAMUSCULAR | Status: DC

## 2022-03-11 MED ORDER — ACETAMINOPHEN 10 MG/ML IV SOLN
INTRAVENOUS | Status: AC
  Filled 2022-03-11: qty 100

## 2022-03-11 MED ORDER — GABAPENTIN 300 MG PO CAPS
900.0000 mg | ORAL_CAPSULE | Freq: Two times a day (BID) | ORAL | Status: DC
  Filled 2022-03-11: qty 3

## 2022-03-11 MED ORDER — PANTOPRAZOLE SODIUM 40 MG PO TBEC
40.0000 mg | DELAYED_RELEASE_TABLET | Freq: Every day | ORAL | Status: DC
  Filled 2022-03-11: qty 1

## 2022-03-11 MED ORDER — PHENYLEPHRINE HCL (PRESSORS) 10 MG/ML IV SOLN
INTRAVENOUS | Status: DC | PRN
  Administered 2022-03-11: 160 ug via INTRAVENOUS
  Administered 2022-03-11: 80 ug via INTRAVENOUS

## 2022-03-11 MED ORDER — KETAMINE HCL 50 MG/5ML IJ SOSY
PREFILLED_SYRINGE | INTRAMUSCULAR | Status: AC
  Filled 2022-03-11: qty 5

## 2022-03-11 MED ORDER — 0.9 % SODIUM CHLORIDE (POUR BTL) OPTIME
TOPICAL | Status: DC | PRN
  Administered 2022-03-11: 70 mL

## 2022-03-11 MED ORDER — GABAPENTIN 300 MG PO CAPS
900.0000 mg | ORAL_CAPSULE | Freq: Two times a day (BID) | ORAL | Status: DC
  Administered 2022-03-11 – 2022-03-15 (×9): 900 mg
  Filled 2022-03-11 (×10): qty 3

## 2022-03-11 MED ORDER — SODIUM CHLORIDE 0.9 % IV SOLN: INTRAVENOUS | Status: DC

## 2022-03-11 MED ORDER — SUCCINYLCHOLINE CHLORIDE 200 MG/10ML IV SOSY
PREFILLED_SYRINGE | INTRAVENOUS | Status: DC | PRN
  Administered 2022-03-11: 100 mg via INTRAVENOUS

## 2022-03-11 SURGICAL SUPPLY — 123 items
BASIN KIT SINGLE STR (MISCELLANEOUS) ×1 IMPLANT
BLADE CLIPPER SURG (BLADE) ×1 IMPLANT
BLADE SURG SZ11 CARB STEEL (BLADE) ×3 IMPLANT
CANNULA REDUC XI 12-8 STAPL (CANNULA)
CANNULA REDUCER 12-8 DVNC XI (CANNULA) ×1 IMPLANT
CHLORAPREP W/TINT 26 (MISCELLANEOUS) ×1 IMPLANT
COVER TIP SHEARS 8 DVNC (MISCELLANEOUS) ×1 IMPLANT
COVER TIP SHEARS 8MM DA VINCI (MISCELLANEOUS)
DERMABOND ADVANCED .7 DNX12 (GAUZE/BANDAGES/DRESSINGS) IMPLANT
DRAPE ARM DVNC X/XI (DISPOSABLE) ×4 IMPLANT
DRAPE COLUMN DVNC XI (DISPOSABLE) ×1 IMPLANT
DRAPE DA VINCI XI ARM (DISPOSABLE)
DRAPE DA VINCI XI COLUMN (DISPOSABLE)
DRAPE LEGGINS SURG 28X43 STRL (DRAPES) ×1 IMPLANT
DRSG OPSITE POSTOP 3X4 (GAUZE/BANDAGES/DRESSINGS) ×6 IMPLANT
DRSG OPSITE POSTOP 4X10 (GAUZE/BANDAGES/DRESSINGS) IMPLANT
DRSG OPSITE POSTOP 4X12 (GAUZE/BANDAGES/DRESSINGS) IMPLANT
DRSG OPSITE POSTOP 4X8 (GAUZE/BANDAGES/DRESSINGS) ×2 IMPLANT
ELECT BLADE 6 FLAT ULTRCLN (ELECTRODE) IMPLANT
ELECT BLADE 6.5 EXT (BLADE) ×3 IMPLANT
ELECT CAUTERY BLADE 6.4 (BLADE) ×3 IMPLANT
ELECT REM PT RETURN 9FT ADLT (ELECTROSURGICAL) ×3
ELECTRODE REM PT RTRN 9FT ADLT (ELECTROSURGICAL) ×3 IMPLANT
GAUZE 4X4 16PLY ~~LOC~~+RFID DBL (SPONGE) ×3 IMPLANT
GLOVE BIOGEL PI IND STRL 7.0 (GLOVE) ×9 IMPLANT
GLOVE SURG SYN 6.5 ES PF (GLOVE) ×15 IMPLANT
GLOVE SURG SYN 6.5 PF PI (GLOVE) ×3 IMPLANT
GOWN STRL REUS W/ TWL LRG LVL3 (GOWN DISPOSABLE) ×12 IMPLANT
GOWN STRL REUS W/TWL LRG LVL3 (GOWN DISPOSABLE) ×9
GRASPER SUT TROCAR 14GX15 (MISCELLANEOUS) IMPLANT
HANDLE SUCTION POOLE (INSTRUMENTS) IMPLANT
HANDLE YANKAUER SUCT BULB TIP (MISCELLANEOUS) ×3 IMPLANT
IRRIGATION STRYKERFLOW (MISCELLANEOUS) IMPLANT
IRRIGATOR STRYKERFLOW (MISCELLANEOUS)
IRRIGATOR SUCT 8 DISP DVNC XI (IRRIGATION / IRRIGATOR) IMPLANT
IRRIGATOR SUCTION 8MM XI DISP (IRRIGATION / IRRIGATOR)
IV NS 1000ML (IV SOLUTION)
IV NS 1000ML BAXH (IV SOLUTION) IMPLANT
KIT PINK PAD W/HEAD ARE REST (MISCELLANEOUS) IMPLANT
KIT PINK PAD W/HEAD ARM REST (MISCELLANEOUS) ×1 IMPLANT
KIT TURNOVER KIT A (KITS) ×3 IMPLANT
LABEL OR SOLS (LABEL) IMPLANT
LIGASURE IMPACT 36 18CM CVD LR (INSTRUMENTS) IMPLANT
MANIFOLD NEPTUNE II (INSTRUMENTS) ×3 IMPLANT
NDL INSUFFLATION 14GA 120MM (NEEDLE) ×1 IMPLANT
NEEDLE HYPO 22GX1.5 SAFETY (NEEDLE) ×3 IMPLANT
NEEDLE INSUFFLATION 14GA 120MM (NEEDLE) ×3 IMPLANT
NS IRRIG 1000ML POUR BTL (IV SOLUTION) ×3 IMPLANT
OBTURATOR OPTICAL STANDARD 8MM (TROCAR)
OBTURATOR OPTICAL STND 8 DVNC (TROCAR)
OBTURATOR OPTICALSTD 8 DVNC (TROCAR) ×1 IMPLANT
PACK BASIN MAJOR ARMC (MISCELLANEOUS) ×1 IMPLANT
PACK COLON CLEAN CLOSURE (MISCELLANEOUS) ×1 IMPLANT
PACK LAP CHOLECYSTECTOMY (MISCELLANEOUS) ×3 IMPLANT
PENCIL SMOKE EVACUATOR (MISCELLANEOUS) ×3 IMPLANT
PORT ACCESS TROCAR AIRSEAL 5 (TROCAR) ×1 IMPLANT
RELOAD LINEAR CUT PROX 55 BLUE (ENDOMECHANICALS) IMPLANT
RELOAD PROXIMATE 75MM BLUE (ENDOMECHANICALS) IMPLANT
RELOAD STAPLE 45 3.5 BLU DVNC (STAPLE) IMPLANT
RELOAD STAPLE 55 3.8 BLU REG (ENDOMECHANICALS) IMPLANT
RELOAD STAPLE 60 3.5 BLU DVNC (STAPLE) IMPLANT
RELOAD STAPLE 75 3.8 BLU REG (ENDOMECHANICALS) IMPLANT
RELOAD STAPLER 3.5X45 BLU DVNC (STAPLE) IMPLANT
RELOAD STAPLER 3.5X60 BLU DVNC (STAPLE) IMPLANT
RELOAD STAPLER LINEAR PROX 30 (STAPLE) IMPLANT
RETRACTOR RING XSMALL (MISCELLANEOUS) ×1 IMPLANT
RETRACTOR WOUND ALXS 18CM MED (MISCELLANEOUS) ×3 IMPLANT
RTRCTR WOUND ALEXIS 13CM XS SH (MISCELLANEOUS)
RTRCTR WOUND ALEXIS O 18CM MED (MISCELLANEOUS) ×3
SEAL CANN UNIV 5-8 DVNC XI (MISCELLANEOUS) ×3 IMPLANT
SEAL XI 5MM-8MM UNIVERSAL (MISCELLANEOUS)
SEALER VESSEL DA VINCI XI (MISCELLANEOUS)
SEALER VESSEL EXT DVNC XI (MISCELLANEOUS) IMPLANT
SET BI-LUMEN FLTR TB AIRSEAL (TUBING) ×1 IMPLANT
SET TRI-LUMEN FLTR TB AIRSEAL (TUBING) ×1 IMPLANT
SLEEVE Z-THREAD 5X100MM (TROCAR) ×1 IMPLANT
SOLUTION ELECTROLUBE (MISCELLANEOUS) ×1 IMPLANT
SPONGE T-LAP 18X18 ~~LOC~~+RFID (SPONGE) ×6 IMPLANT
STAPLER 45 DA VINCI SURE FORM (STAPLE)
STAPLER 45 SUREFORM DVNC (STAPLE) IMPLANT
STAPLER 60 DA VINCI SURE FORM (STAPLE)
STAPLER 60 SUREFORM DVNC (STAPLE) IMPLANT
STAPLER CANNULA SEAL DVNC XI (STAPLE) ×1 IMPLANT
STAPLER CANNULA SEAL XI (STAPLE)
STAPLER PROXIMATE 55 BLUE (STAPLE) IMPLANT
STAPLER PROXIMATE 75MM BLUE (STAPLE) IMPLANT
STAPLER RELOAD 3.5X45 BLU DVNC (STAPLE)
STAPLER RELOAD 3.5X45 BLUE (STAPLE)
STAPLER RELOAD 3.5X60 BLU DVNC (STAPLE)
STAPLER RELOAD 3.5X60 BLUE (STAPLE)
STAPLER RELOAD LINEAR PROX 30 (STAPLE)
STAPLER RELOADABLE 30 BLU REG (STAPLE) IMPLANT
STAPLER SKIN PROX 35W (STAPLE) ×1 IMPLANT
SUCTION POOLE HANDLE (INSTRUMENTS)
SUT MNCRL 4-0 (SUTURE)
SUT MNCRL 4-0 27XMFL (SUTURE)
SUT MNCRL AB 4-0 PS2 18 (SUTURE) ×1 IMPLANT
SUT PDS AB 1 CT1 36 (SUTURE) ×7 IMPLANT
SUT PDS AB 1 TP1 54 (SUTURE) ×2 IMPLANT
SUT PROLENE 3 0 PS 2 (SUTURE) IMPLANT
SUT SILK 2 0 (SUTURE)
SUT SILK 2-0 18XBRD TIE 12 (SUTURE) IMPLANT
SUT SILK 3 0 (SUTURE)
SUT SILK 3 0 REEL (SUTURE) IMPLANT
SUT SILK 3 0 SH 30 (SUTURE) IMPLANT
SUT SILK 3-0 (SUTURE) ×3 IMPLANT
SUT SILK 3-0 18XBRD TIE 12 (SUTURE) IMPLANT
SUT VIC AB 0 CT1 18XCR BRD 8 (SUTURE) IMPLANT
SUT VIC AB 0 CT1 8-18 (SUTURE)
SUT VIC AB 3-0 SH 27 (SUTURE) ×6
SUT VIC AB 3-0 SH 27X BRD (SUTURE) ×8 IMPLANT
SUT VICRYL 0 AB UR-6 (SUTURE) ×1 IMPLANT
SUT VLOC 90 6 CV-15 VIOLET (SUTURE) ×1 IMPLANT
SUTURE MNCRL 4-0 27XMF (SUTURE) ×2 IMPLANT
SYR 30ML LL (SYRINGE) ×6 IMPLANT
SYS LAPSCP GELPORT 120MM (MISCELLANEOUS)
SYSTEM LAPSCP GELPORT 120MM (MISCELLANEOUS) ×1 IMPLANT
SYSTEM WECK SHIELD CLOSURE (TROCAR) IMPLANT
TOWEL OR 17X26 4PK STRL BLUE (TOWEL DISPOSABLE) ×1 IMPLANT
TRAP FLUID SMOKE EVACUATOR (MISCELLANEOUS) ×3 IMPLANT
TRAY FOLEY MTR SLVR 16FR STAT (SET/KITS/TRAYS/PACK) ×3 IMPLANT
TRAY FOLEY SLVR 16FR LF STAT (SET/KITS/TRAYS/PACK) IMPLANT
WATER STERILE IRR 500ML POUR (IV SOLUTION) ×3 IMPLANT

## 2022-03-11 NOTE — TOC Initial Note (Signed)
Transition of Care [facility]) - Initial/Assessment Note    Patient Details  Name: Amy Huynh MRN: [id number] Date of Birth: August 17, 1955  Transition of Care [facility]) CM/SW Contact:    Beverly Sessions, RN Phone Number: 03/11/2022, 3:18 PM  Clinical Narrative:                   Transition of Care (TOC) Screening Note   Patient Details  Name: Amy Huynh Date of Birth: 09-04-1955   Transition of Care [facility]) CM/SW Contact:    Beverly Sessions, RN Phone Number: 03/11/2022, 3:18 PM    Transition of Care Department [facility]) has reviewed patient and no TOC needs have been identified at this time. We will continue to monitor patient advancement through interdisciplinary progression rounds. If new patient transition needs arise, please place a TOC consult.         Patient Goals and CMS Choice        Expected Discharge Plan and Services                                                Prior Living Arrangements/Services                       Activities of Daily Living Home Assistive Devices/Equipment: Cane (specify quad or straight), Walker (specify type) ADL Screening (condition at time of admission) Patient's cognitive ability adequate to safely complete daily activities?: Yes Is the patient deaf or have difficulty hearing?: No Does the patient have difficulty seeing, even when wearing glasses/contacts?: No Does the patient have difficulty concentrating, remembering, or making decisions?: No Patient able to express need for assistance with ADLs?: Yes Does the patient have difficulty dressing or bathing?: No Independently performs ADLs?: Yes (appropriate for developmental age) Does the patient have difficulty walking or climbing stairs?: Yes Weakness of Legs: Both (neuropathy) Weakness of Arms/Hands: None  Permission Sought/Granted                  Emotional Assessment              Admission diagnosis:  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] Epigastric pain [R10.13] 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] Generalized weakness [R53.1] Other fatigue [R53.83] Patient Active Problem List   Diagnosis Date Noted   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   Melena    Heartburn    Acute peptic ulcer of stomach    Bradycardia 10/16/2020   Stage 3a chronic kidney disease ([place]) 06/24/2020   Abnormal ECG 05/22/2020   History of degenerative disc disease 01/29/2020   LVH (left ventricular hypertrophy) 11/15/2019   Acute respiratory failure with hypoxia ([place]) 09/16/2019   Multilobar lung infiltrate 09/16/2019   Hypomagnesemia 09/16/2019   Muscle weakness (generalized) 09/16/2019   Community acquired pneumonia 09/12/2019   Hypokalemia 09/12/2019   Psoriasis 02/15/2019   Personal history of skin cancer 12/01/2018   Sjogren's syndrome ([place]) 10/11/2017   Family history of ovarian cancer 01/11/2017   Acute bursitis of right shoulder 06/07/2016   Partial tear of right rotator cuff 06/07/2016   Lumbar radiculopathy 07/23/2015   Migraine without aura and without status migrainosus, not intractable 04/23/2015   Chronic daily headache 04/23/2015   DDD (degenerative disc disease), cervical 07/06/2014   Neck pain 07/06/2014   Dyspareunia 12/14/2013   Benign essential hypertension 09/13/2013   Polyarthralgia  07/20/2013   Diverticula of colon 01/22/2013   Fibromyalgia 01/22/2013   Restless legs syndrome 01/22/2013   Moderate obstructive sleep apnea 01/22/2013   Migraine headache 01/22/2013   Insomnia 01/22/2013   Generalized anxiety disorder 01/22/2013   Esophageal reflux 01/22/2013   Atrophic vaginitis 01/22/2013   Anemia 01/22/2013   Allergic rhinitis 01/22/2013   Urticaria 01/18/2013   Low back pain 10/03/2012   Lumbosacral spondylosis 08/17/2012   PCP:  Gladstone Lighter, MD Pharmacy:   [facility] [place], [place] - [place] [facility] [address] AT [address] [place]  [address] Phone: [phone] Fax: [phone]     Social Determinants of Health (SDOH) Interventions    Readmission Risk Interventions     No data to display

## 2022-03-11 NOTE — Anesthesia Postprocedure Evaluation (Signed)
Anesthesia Post Note  Patient: Engineer, manufacturing  Procedure(s) Performed: EXPLORATORY LAPAROTOMY Mesenteric mass biopsy (Abdomen)  Patient location during evaluation: PACU Anesthesia Type: General Level of consciousness: awake and alert Pain management: pain level controlled Vital Signs Assessment: post-procedure vital signs reviewed and stable Respiratory status: spontaneous breathing, nonlabored ventilation and respiratory function stable Cardiovascular status: blood pressure returned to baseline and stable Postop Assessment: no apparent nausea or vomiting Anesthetic complications: no   No notable events documented.   Last Vitals:  Vitals:   03/11/22 0639 03/11/22 0655  BP: 111/63   Pulse: 64 69  Resp: 16   Temp: (!) 36.4 C   SpO2: 100% 99%    Last Pain:  Vitals:   03/11/22 0639  TempSrc: Oral  PainSc:                  [place] Ouch

## 2022-03-11 NOTE — Anesthesia Procedure Notes (Signed)
Procedure Name: Intubation Date/Time: 03/11/2022 3:18 AM  Performed by: Hedda Slade, CRNAPre-anesthesia Checklist: Patient identified, Patient being monitored, Timeout performed, Emergency Drugs available and Suction available Patient Re-evaluated:Patient Re-evaluated prior to induction Oxygen Delivery Method: Circle system utilized Preoxygenation: Pre-oxygenation with 100% oxygen Induction Type: IV induction, Rapid sequence and Cricoid Pressure applied Laryngoscope Size: McGraph and 3 Grade View: Grade I Tube type: Oral Tube size: 7.0 mm Number of attempts: 1 Airway Equipment and Method: Stylet Placement Confirmation: ETT inserted through vocal cords under direct vision, positive ETCO2 and breath sounds checked- equal and bilateral Secured at: 21 cm Tube secured with: Tape Dental Injury: Teeth and Oropharynx as per pre-operative assessment

## 2022-03-11 NOTE — Plan of Care (Signed)
  Problem: Activity: Goal: Risk for activity intolerance will decrease Outcome: Progressing   Problem: Nutrition: Goal: Adequate nutrition will be maintained Outcome: Progressing   Problem: Coping: Goal: Level of anxiety will decrease Outcome: Progressing   Problem: Elimination: Goal: Will not experience complications related to urinary retention Outcome: Progressing   Problem: Pain Managment: Goal: General experience of comfort will improve Outcome: Progressing   Problem: Safety: Goal: Ability to remain free from injury will improve Outcome: Progressing   Problem: Skin Integrity: Goal: Risk for impaired skin integrity will decrease Outcome: Progressing   

## 2022-03-11 NOTE — Anesthesia Preprocedure Evaluation (Addendum)
Anesthesia Evaluation  Patient identified by MRN, date of birth, ID band Patient awake    Reviewed: Allergy & Precautions, NPO status , Patient's Chart, lab work & pertinent test results, reviewed documented beta blocker date and time   History of Anesthesia Complications Negative for: history of anesthetic complications  Airway Mallampati: I  TM Distance: >3 FB Neck ROM: Limited    Dental  (+) Missing, Caps,    Pulmonary sleep apnea ,    breath sounds clear to auscultation       Cardiovascular Exercise Tolerance: Poor (-) angina(-) DOE  Rhythm:Irregular Rate:Normal - Systolic murmurs and - Peripheral Edema    Neuro/Psych  Headaches, PSYCHIATRIC DISORDERS Anxiety  Fibromyalgia  S/p cervical fusion  Neuromuscular disease (Lumbar radiculopathy)    GI/Hepatic GERD  ,Concern for early mesenteric ischemia   Endo/Other    Renal/GU CRFRenal disease     Musculoskeletal  (+) Arthritis , Fibromyalgia - Sjogren's   Abdominal (+) + obese (BMI 31),   Peds  Hematology negative hematology ROS (+)  H/o DVT in 20s   Anesthesia Other Findings generalized weakness and fatigue over the last 3 to 4 days  CT SCAN: IMPRESSION: 1. Several new subcentimeter noncalcified lung nodules within the bilateral lung bases (the largest of which measures 6 mm). Correlation with nonemergent chest CT is recommended to evaluate for the presence of an underlying neoplastic process. 2. 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 3. Small area of focal mesenteric inflammatory fat stranding, concerning for findings associated with early mesenteric ischemia. 4. Sigmoid diverticulosis. 5. Evidence of prior cholecystectomy.  Reproductive/Obstetrics                            Anesthesia Physical  Anesthesia Plan  ASA: 3 and emergent  Anesthesia Plan: General   Post-op Pain Management: Ofirmev IV (intra-op)* and Ketamine IV*   Induction: Intravenous, Rapid sequence and Cricoid pressure planned  PONV Risk Score and Plan: 4 or greater and Ondansetron, Dexamethasone, Midazolam and Droperidol  Airway Management Planned: Oral ETT  Additional Equipment:   Intra-op Plan:   Post-operative Plan: Extubation in OR  Informed Consent: I have reviewed the patients History and Physical, chart, labs and discussed the procedure including the risks, benefits and alternatives for the proposed anesthesia with the patient or authorized representative who has indicated his/her understanding and acceptance.     Dental advisory given  Plan Discussed with: CRNA and Anesthesiologist  Anesthesia Plan Comments:        Anesthesia Quick Evaluation

## 2022-03-11 NOTE — Transfer of Care (Signed)
Immediate Anesthesia Transfer of Care Note  Patient: Amy Huynh  Procedure(s) Performed: EXPLORATORY LAPAROTOMY Mesenteric mass biopsy (Abdomen)  Patient Location: PACU  Anesthesia Type:General  Level of Consciousness: awake, alert  and oriented  Airway & Oxygen Therapy: Patient Spontanous Breathing  Post-op Assessment: Reviewed and stable  Post vital signs: Reviewed and stable  Last Vitals:  Vitals Value Taken Time  BP 133/73 03/11/22 0513  Temp    Pulse 105 03/11/22 0513  Resp 11 03/11/22 0513  SpO2 98 % 03/11/22 0513  Vitals shown include unvalidated device data.  Last Pain:  Vitals:   03/10/22 2246  TempSrc: Oral  PainSc:          Complications: No notable events documented.

## 2022-03-11 NOTE — H&P (Signed)
Subjective:   CC: Mesenteric volvulus  HPI:  Amy Huynh is a 66 y.o. female who was consulted by Modesto Charon for issue above.  Symptoms were first noted several days ago. Pain is crampy, right upper quadrant and periumbilical.  Gradual onset with no specific instigating factor, continues to worsen.  Patient did not specifically report previous episodes but chart check notes similar incidences in the past month.  Associated with nausea and light-colored loose stools.  Also complains of shortness of breath, weakness, and reported low blood pressure which was the initial chief complaint on presentation to the ED.  Subsequent BP measurements in the ED did not show signs of hypotension. exacerbated by nothing specific.   Past Medical History:  has a past medical history of Arthritis, Family history of ovarian cancer (12/2016), Fibromyalgia, Migraine, Neuropathy, Placenta previa, and Sjogren's disease (HCC).  Past Surgical History:  Past Surgical History:  Procedure Laterality Date   BIOPSY N/A 06/26/2021   Procedure: BIOPSY;  Surgeon: Midge Minium, MD;  Location: [facility];  Service: Endoscopy;  Laterality: N/A;   CERVICAL FUSION     CESAREAN SECTION     CHOLECYSTECTOMY     ESOPHAGOGASTRODUODENOSCOPY (EGD) WITH PROPOFOL N/A 06/26/2021   Procedure: ESOPHAGOGASTRODUODENOSCOPY (EGD) WITH PROPOFOL;  Surgeon: Midge Minium, MD;  Location: [facility];  Service: Endoscopy;  Laterality: N/A;   HERNIA REPAIR     REPLACEMENT TOTAL KNEE Left    SHOULDER SURGERY Right     Family History: family history includes COPD in her mother; Heart attack (age of onset: 71) in her father; Hypertension in her mother; Other in her father; Ovarian cancer (age of onset: 32) in her maternal aunt; Uterine cancer in her maternal grandmother and mother.  Social History:  reports that she has never smoked. She has never used smokeless tobacco. She reports that she does not currently use alcohol. She  reports that she does not use drugs.  Current Medications:  Prior to Admission medications   Medication Sig Start Date End Date Taking? Authorizing Provider  butalbital-acetaminophen-caffeine (FIORICET, ESGIC) 50-325-40 MG tablet Take 1 tablet by mouth every 6 (six) hours as needed for headache.    [provider]  cetirizine (ZYRTEC) 10 MG tablet Take 10 mg by mouth daily.    [provider]  clobetasol cream (TEMOVATE) 0.05 % Apply topically. 09/07/17   [provider]  cyanocobalamin 1000 MCG tablet Take 1,000 mcg by mouth daily.    [provider]  dexlansoprazole (DEXILANT) 60 MG capsule Take 1 capsule (60 mg total) by mouth daily. 01/19/22   Midge Minium, MD  fexofenadine (ALLEGRA) 180 MG tablet Take 180 mg by mouth daily.    [provider]  folic acid (FOLVITE) 1 MG tablet Take 1 mg by mouth daily. 08/16/19   [provider]  gabapentin (NEURONTIN) 300 MG capsule Take 900 mg by mouth at bedtime.    [provider]  hydrOXYzine (ATARAX/VISTARIL) 10 MG tablet Take 10 mg by mouth every 8 (eight) hours as needed.    [provider]  hyoscyamine (LEVSIN, ANASPAZ) 0.125 MG tablet Take 0.125 mg by mouth every 4 (four) hours as needed.    [provider]  methocarbamol (ROBAXIN) 500 MG tablet Take 500 mg by mouth at bedtime.  08/22/17   [provider]  methotrexate (RHEUMATREX) 2.5 MG tablet Take 25 mg by mouth once a week. Take 5 tablets by mouth in the morning and 5 tablets by mouth in the evening  on Saturdays 02/08/18   [provider]  pilocarpine (SALAGEN) 5 MG tablet Take 5 mg by mouth 3 (three) times daily.    [provider]  promethazine (PHENERGAN) 25 MG tablet Take 25 mg by mouth every 6 (six) hours as needed for nausea or vomiting.    [provider]  rizatriptan (MAXALT) 10 MG tablet Take 10 mg by mouth as needed for migraine. May repeat in 2 hours if needed    [provider]  rOPINIRole (REQUIP) 0.5 MG tablet Take 1 tablet by mouth at bedtime.  07/26/19 06/26/21  [provider]  rOPINIRole (REQUIP) 2 MG tablet Take 2 mg by mouth at bedtime.    [provider]  topiramate (TOPAMAX) 100 MG tablet Take 200 mg by mouth daily.    [provider]  valACYclovir (VALTREX) 1000 MG tablet Take 1 tablet once a day by mouth 07/14/16   [provider]  VITAMIN D, CHOLECALCIFEROL, PO Take by mouth.    [provider]    Allergies:  Allergies as of 03/10/2022 - Review Complete 03/10/2022  Allergen Reaction Noted   Hydrocodone-acetaminophen  09/24/2015   Hydrocodone-acetaminophen  09/24/2015   Morphine Itching and Other (See Comments) 10/03/2012   Morphine and related Itching 10/03/2012   Cymbalta [duloxetine hcl] Other (See Comments) 12/29/2016   Duloxetine Other (See Comments) 07/23/2015   Pregabalin Other (See Comments) 09/24/2015    ROS:  General: Denies weight loss, weight gain, fatigue, fevers, chills, and night sweats. Eyes: Denies blurry vision, double vision, eye pain, itchy eyes, and tearing. Ears: Denies hearing loss, earache, and ringing in ears. Nose: Denies sinus pain, congestion, infections, runny nose, and nosebleeds. Mouth/throat: Denies hoarseness, sore throat, bleeding gums, and difficulty swallowing. Heart: Denies chest pain, palpitations, racing heart, irregular heartbeat, leg pain or swelling, and decreased activity tolerance. Respiratory: Denies breathing difficulty, shortness of breath, wheezing, cough, and sputum. GI: Denies change in appetite, heartburn, vomiting, constipation, and blood in stool. GU: Denies difficulty urinating, pain with urinating, urgency, frequency, blood in urine. Musculoskeletal: Denies joint stiffness, pain, swelling, muscle weakness. Skin: Denies rash, itching, mass, tumors, sores, and boils Neurologic: Denies headache, fainting, dizziness, seizures, numbness, and  tingling. Psychiatric: Denies depression, anxiety, difficulty sleeping, and memory loss. Endocrine: Denies heat or cold intolerance, and increased thirst or urination. Blood/lymph: Denies easy bruising, easy bruising, and swollen glands     Objective:     BP 116/74   Pulse 93   Temp 97.7 F (36.5 C) (Oral)   Resp 15   Ht 5\' 2"  (1.575 m)   Wt 79.4 kg   SpO2 98%   BMI 32.01 kg/m   Constitutional :  alert, cooperative, appears stated age, and no distress  Lymphatics/Throat:  no asymmetry, masses, or scars  Respiratory:  clear to auscultation bilaterally  Cardiovascular:  regular rate and rhythm  Gastrointestinal: Soft, no guarding, minor tenderness to palpation in right upper quadrant and right lower quadrant .   Musculoskeletal: Steady movement  Skin: Cool and moist   Psychiatric: Normal affect, non-agitated, not confused       LABS:     Latest Ref Rng & Units 03/10/2022   11:02 PM 02/05/2022    4:39 PM 01/04/2022   12:56 AM  CMP  Glucose 70 - 99 mg/dL 01/06/2022  888  916   BUN 8 - 23 mg/dL 11  10  13    Creatinine 0.44 - 1.00 mg/dL 945   0.38   Sodium 135 -  145 mmol/L 140  142  139   Potassium 3.5 - 5.1 mmol/L 3.4  3.8  4.0   Chloride 98 - 111 mmol/L 112  111  110   CO2 22 - 32 mmol/L 24  24  23    Calcium 8.9 - 10.3 mg/dL 8.7  9.7  9.7   Total Protein 6.5 - 8.1 g/dL 6.6  7.3  7.6   Total Bilirubin 0.3 - 1.2 mg/dL 0.6  0.5  0.7   Alkaline Phos 38 - 126 U/L 66  65  70   AST 15 - 41 U/L 18  20  30    ALT 0 - 44 U/L 11  14  12        Latest Ref Rng & Units 03/10/2022   11:02 PM 02/05/2022    4:39 PM 01/04/2022    1:39 AM  CBC  WBC 4.0 - 10.5 K/uL 5.2  4.0    4.0  7.5   Hemoglobin 12.0 - 15.0 g/dL 03/12/2022  02/07/2022    01/06/2022  93.2   Hematocrit 36.0 - 46.0 % 36.8  39.4    39.1  39.0   Platelets 150 - 400 K/uL 246  274    295  213     RADS: CLINICAL DATA:  Low blood pressure and weakness with abdominal pain and shortness of breath.   EXAM: CT ABDOMEN AND PELVIS WITH  CONTRAST   TECHNIQUE: Multidetector CT imaging of the abdomen and pelvis was performed using the standard protocol following bolus administration of intravenous contrast.   RADIATION DOSE REDUCTION: This exam was performed according to the departmental dose-optimization program which includes automated exposure control, adjustment of the mA and/or kV according to patient size and/or use of iterative reconstruction technique.   CONTRAST:  35.5 OMNIPAQUE IOHEXOL 300 MG/ML  SOLN   COMPARISON:  Abdomen and pelvis CT, dated January 27, 2019/chest CTA, dated January 04, 2022   FINDINGS: Lower chest: Several subcentimeter noncalcified lung nodules are seen within the bilateral lung bases. The largest measures approximately 6 mm (axial CT image 3, CT series 4). These are not seen within the visualized portions of the lung bases on the most recent chest CTA.   Hepatobiliary: No focal liver abnormality is seen. Status post cholecystectomy. The common bile duct measures 11 mm in diameter.   Pancreas: Unremarkable. No pancreatic ductal dilatation or surrounding inflammatory changes.   Spleen: Normal in size without focal abnormality.   Adrenals/Urinary Tract: Adrenal glands are unremarkable. Kidneys are normal, without renal calculi, focal lesion, or hydronephrosis. The urinary bladder is poorly distended and subsequently limited in evaluation.   Stomach/Bowel: Stomach is within normal limits. Appendix appears normal. The loops of duodenum and jejunum normally seen within the left upper quadrant and mid left abdomen are malpositioned and are now located within the mid right abdomen (axial CT images 18 through 44, CT series 2). This represents a new finding when compared to the prior study. There is no evidence of bowel dilatation. The small bowel loops within the mid to lower abdomen and pelvis are decompressed. Noninflamed diverticula are seen throughout the sigmoid colon.    Vascular/Lymphatic: No significant vascular findings are present. No enlarged abdominal or pelvic lymph nodes.   Reproductive: Uterus and bilateral adnexa are unremarkable.   Other: No abdominal wall hernia or abnormality. No abdominopelvic ascites.   A 2.6 cm x 1.6 cm x 2.0 cm area of hazy mesenteric inflammatory fat stranding is seen within the mid to lower abdomen, along  the midline (axial CT images 44 through 49, CT series 2/coronal re-formatted image 26, CT series 5). There is no evidence of associated mesenteric lymphadenopathy. An adjacent area of mild mesenteric twisting is seen along the medial aspect of the mid right abdomen (axial CT images 33 through 46, CT series 2). Mild testing of the mesenteric vessels is also seen within this region. This is not seen on the prior study.   Musculoskeletal: No acute or significant osseous findings.   IMPRESSION: 1. Several new subcentimeter noncalcified lung nodules within the bilateral lung bases (the largest of which measures 6 mm). Correlation with nonemergent chest CT is recommended to evaluate for the presence of an underlying neoplastic process. 2. 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 3. Small area of focal mesenteric inflammatory fat stranding, concerning for findings associated with early mesenteric ischemia. 4. Sigmoid diverticulosis. 5. Evidence of prior cholecystectomy.     Electronically Signed   By: Virgina Norfolk M.D.   On: 03/11/2022 00:46   Assessment:   Abdominal pain for the past several days gradual worsening with CT scan report as noted above  Plan:   Duration of symptoms and physical exam along with initial labs show no signs of ischemia, however with the CT scan read, recommended proceeding with diagnostic laparoscopy since  delaying diagnosis can lead to catastrophic consequences with bowel ischemia.  I have reviewed the images personally and agree there is some changes concerning for a volvulus. She does have a previous history of cholecystectomy.   The risk of surgery include, but not limited to, recurrence, bleeding, chronic pain, post-op infxn, post-op SBO or ileus, hernias, resection of bowel, re-anastamosis, and need for re-operation to address said risks. The risks of general anesthetic, if used, includes MI, CVA, sudden death or even reaction to anesthetic medications also discussed. Alternatives include continued observation, although delaying care can be catastrophic.  Benefits include possible symptom relief, preventing further decline in health and possible death.  Typical post-op recovery time of additional days in hospital for observation afterwards also discussed.  The patient verbalized understanding and all questions were answered to the patient's satisfaction.  We will also need to consider further work-up for the incidental lung nodule findings noted on the recent CT scan.  We will determine urgency and course of work-up once the surgery is completed.  labs/images/medications/previous chart entries reviewed personally and relevant changes/updates noted above.

## 2022-03-11 NOTE — Op Note (Signed)
Preoperative diagnosis: Mesenteric volvulus  Postoperative diagnosis: Mesenteric mass  Procedure: Robotic assisted diagnostic laparoscopy, incisional biopsy of mesenteric mass   anesthesia: GETA   Surgeon: Benjamine Sprague   Wound Classification: clean    Specimen: Mesenteric mass biopsyComplications: None   Estimated Blood Loss: 20 mL  Indications:  Please see H&P for further details.     FIndings: 1.  Mid jejunal irritation of the mesentery with associated inflammation possible mass on palpation  2.  Adequate hemostasis.  3.  No other visible pathology   Description of procedure: The patient was placed on the operating table in the supine position, left arms tucked. General anesthesia was induced. A time-out was completed verifying correct patient, procedure, site, positioning, and implant(s) and/or special equipment prior to beginning this procedure. The abdomen was prepped and draped in the usual sterile fashion.    Palmer's point located and Veress needle was inserted.  After confirming 2 clicks and a positive saline drop test, gas insufflation was initiated until the abdominal pressure was measured at 15 mmHg.  Afterwards, the Veress needle was removed and a 5 mm port was placed through the same site using Optiview technique after extending the incision with an 11 blade.  Inspection of the abdominal cavity noted no obvious pathology along the abdominal wall liver stomach and visible small bowel.  After local was infused, 2 additional incision was made 8 cm apart along the left side of the abdominal wall from the initial incision and two 63mm placed under direct visualization. No injuries from trocar placements were noted.   Laparoscopic graspers were then used to run the entire small bowel from terminal ileum to the ligament of Treitz.  Around mid jejunum there was noted to be inflammation of the mesentery with associated thickening, but no obvious twisting.  Due to the isolated  inflammation at this point with concern for possible mass within this area, decision was made to convert to a open procedure for further evaluation.  Supraumbilical midline incision made and dissection carried through the fascia into abdominal cavity.  Abdominal insufflation stopped and ports removed at this point.  Palpation of the area of concern noted on laparoscopy noted a very firm masslike structure within the inflamed mesentery.  The associated bowel and the surrounding area all looked grossly normal.  Small bowel is once again run from the ligament of Treitz to the terminal ileum to confirm no additional pathology was noted elsewhere.  Incisional biopsy of this area of inflammation was taken and passed off operative field pending pathology.  Of note, there were some visible areas of normal mesenteric fat within the incision site, deeper to the inflamed and thickened surface.  However, additional palpation beyond this normal-appearing mesenteric fat still noted some extensive firmness.  Additional biopsies were taken.  Inspection of the overall mesentery still did not note any volvulus and there is no need for reorienting the small bowel.  Small mesenteric tear was reinforced with 3-0 silk x1.  All small bowel contents were reduced back into the abdominal cavity at this time.  NG placement confirmed by palpation within the stomach.  Exparel infused around the midline incision prior to closing with 1 PDS x2.  Overlying subcutaneous layer was approximated with interrupted 3-0 Vicryl prior to closing all skin incision sites including port sites with staples.  All wounds then dressed with honeycomb dressing.   The patient tolerated the procedure well, awakened from anesthesia and was taken to the postanesthesia care unit in satisfactory condition.  NG placed by anesthesia preop still in place in anticipation of possible ileus.  Sponge count and instrument count correct at the end of the procedure.

## 2022-03-11 NOTE — ED Notes (Signed)
Transported to surgery via stretcher.

## 2022-03-12 LAB — CBC
HCT: 39.4 % (ref 36.0–46.0)
Hemoglobin: 12.2 g/dL (ref 12.0–15.0)
MCH: 27.3 pg (ref 26.0–34.0)
MCHC: 31 g/dL (ref 30.0–36.0)
MCV: 88.1 fL (ref 80.0–100.0)
Platelets: 230 10*3/uL (ref 150–400)
RBC: 4.47 MIL/uL (ref 3.87–5.11)
RDW: 18 % — ABNORMAL HIGH (ref 11.5–15.5)
WBC: 9.1 10*3/uL (ref 4.0–10.5)
nRBC: 0 % (ref 0.0–0.2)

## 2022-03-12 LAB — BASIC METABOLIC PANEL
Anion gap: 9 (ref 5–15)
BUN: 8 mg/dL (ref 8–23)
CO2: 18 mmol/L — ABNORMAL LOW (ref 22–32)
Calcium: 8.7 mg/dL — ABNORMAL LOW (ref 8.9–10.3)
Chloride: 114 mmol/L — ABNORMAL HIGH (ref 98–111)
Creatinine, Ser: 0.95 mg/dL (ref 0.44–1.00)
GFR, Estimated: >60 mL/min (ref >60)
Glucose, Bld: 99 mg/dL (ref 70–99)
Potassium: 3.7 mmol/L (ref 3.5–5.1)
Sodium: 141 mmol/L (ref 135–145)

## 2022-03-12 MED ORDER — KCL IN DEXTROSE-NACL 40-5-0.45 MEQ/L-%-% IV SOLN
INTRAVENOUS | Status: DC
  Filled 2022-03-12 (×5): qty 1000

## 2022-03-12 MED ORDER — HYDROCORTISONE 1 % EX CREA
1.0000 | TOPICAL_CREAM | Freq: Three times a day (TID) | CUTANEOUS | Status: DC | PRN
  Administered 2022-03-12: 1 via TOPICAL
  Filled 2022-03-12: qty 28

## 2022-03-12 NOTE — Progress Notes (Signed)
Subjective:  CC: Amy Huynh is a 66 y.o. female  Hospital stay day 1, 1 Day Post-Op ex-lap, mesentery biopsy  HPI: No acute issues overnight.  No flatus, BM.  Pain manageable.  ROS:  General: Denies weight loss, weight gain, fatigue, fevers, chills, and night sweats. Heart: Denies chest pain, palpitations, racing heart, irregular heartbeat, leg pain or swelling, and decreased activity tolerance. Respiratory: Denies breathing difficulty, shortness of breath, wheezing, cough, and sputum. GI: Denies change in appetite, heartburn, nausea, vomiting, constipation, diarrhea, and blood in stool. GU: Denies difficulty urinating, pain with urinating, urgency, frequency, blood in urine.   Objective:   Temp:  [97.3 F (36.3 C)-98.1 F (36.7 C)] 97.7 F (36.5 C) (10/27 0819) Pulse Rate:  [68-105] 105 (10/27 0819) Resp:  [18-20] 18 (10/27 0819) BP: (107-146)/(55-87) 146/87 (10/27 0819) SpO2:  [96 %-100 %] 96 % (10/27 0819)     Height: 5\' 2"  (157.5 cm) Weight: 79.4 kg BMI (Calculated): 32   Intake/Output this shift:   Intake/Output Summary (Last 24 hours) at 03/12/2022 1018 Last data filed at 03/12/2022 0410 Gross per 24 hour  Intake 1462.66 ml  Output 1300 ml  Net 162.66 ml    Constitutional :  alert, cooperative, appears stated age, and no distress  Respiratory:  clear to auscultation bilaterally  Cardiovascular:  regular rate and rhythm  Gastrointestinal: Soft, TTP RUQ and around port sites and midline incision .   Skin: Cool and moist. Incision C/D/I  Psychiatric: Normal affect, non-agitated, not confused       LABS:     Latest Ref Rng & Units 03/12/2022    5:06 AM 03/11/2022    7:03 AM 03/10/2022   11:02 PM  CMP  Glucose 70 - 99 mg/dL 99   127   BUN 8 - 23 mg/dL 8   11   Creatinine 0.44 - 1.00 mg/dL 0.95  1.08  1.12   Sodium 135 - 145 mmol/L 141   140   Potassium 3.5 - 5.1 mmol/L 3.7   3.4   Chloride 98 - 111 mmol/L 114   112   CO2 22 - 32 mmol/L 18   24    Calcium 8.9 - 10.3 mg/dL 8.7   8.7   Total Protein 6.5 - 8.1 g/dL   6.6   Total Bilirubin 0.3 - 1.2 mg/dL   0.6   Alkaline Phos 38 - 126 U/L   66   AST 15 - 41 U/L   18   ALT 0 - 44 U/L   11       Latest Ref Rng & Units 03/12/2022    5:06 AM 03/11/2022    7:03 AM 03/10/2022   11:02 PM  CBC  WBC 4.0 - 10.5 K/uL 9.1  10.1  5.2   Hemoglobin 12.0 - 15.0 g/dL 12.2  11.7  11.4   Hematocrit 36.0 - 46.0 % 39.4  38.7  36.8   Platelets 150 - 400 K/uL 230  237  246     RADS: N/a Assessment:   S/p ex-lap small bowel mesentary biopsy secondary to concern for mesenteric volvulus.  As expected.  NG output high but thin in nature, she is taking in ice chips.  Will continue in anticipation of ileus.  Encouraged ambulation, gum, hard candy.  labs/images/medications/previous chart entries reviewed personally and relevant changes/updates noted above.

## 2022-03-12 NOTE — Plan of Care (Signed)
  Problem: Activity: Goal: Risk for activity intolerance will decrease Outcome: Progressing   Problem: Pain Managment: Goal: General experience of comfort will improve Outcome: Progressing   Problem: Skin Integrity: Goal: Risk for impaired skin integrity will decrease Outcome: Progressing   

## 2022-03-13 LAB — BASIC METABOLIC PANEL
Anion gap: 6 (ref 5–15)
BUN: 10 mg/dL (ref 8–23)
CO2: 22 mmol/L (ref 22–32)
Calcium: 8.6 mg/dL — ABNORMAL LOW (ref 8.9–10.3)
Chloride: 113 mmol/L — ABNORMAL HIGH (ref 98–111)
Creatinine, Ser: 0.92 mg/dL (ref 0.44–1.00)
GFR, Estimated: >60 mL/min (ref >60)
Glucose, Bld: 94 mg/dL (ref 70–99)
Potassium: 3.6 mmol/L (ref 3.5–5.1)
Sodium: 141 mmol/L (ref 135–145)

## 2022-03-13 LAB — CBC
HCT: 33.8 % — ABNORMAL LOW (ref 36.0–46.0)
Hemoglobin: 10.4 g/dL — ABNORMAL LOW (ref 12.0–15.0)
MCH: 27.2 pg (ref 26.0–34.0)
MCHC: 30.8 g/dL (ref 30.0–36.0)
MCV: 88.3 fL (ref 80.0–100.0)
Platelets: 197 10*3/uL (ref 150–400)
RBC: 3.83 MIL/uL — ABNORMAL LOW (ref 3.87–5.11)
RDW: 17.8 % — ABNORMAL HIGH (ref 11.5–15.5)
WBC: 8 10*3/uL (ref 4.0–10.5)
nRBC: 0 % (ref 0.0–0.2)

## 2022-03-13 MED ORDER — PILOCARPINE HCL 5 MG PO TABS
5.0000 mg | ORAL_TABLET | Freq: Every day | ORAL | Status: DC
  Administered 2022-03-14 – 2022-03-15 (×2): 5 mg via ORAL
  Filled 2022-03-13 (×2): qty 1

## 2022-03-13 MED ORDER — PANTOPRAZOLE SODIUM 40 MG PO TBEC
40.0000 mg | DELAYED_RELEASE_TABLET | Freq: Every day | ORAL | Status: DC
  Administered 2022-03-14 – 2022-03-15 (×2): 40 mg via ORAL
  Filled 2022-03-13 (×2): qty 1

## 2022-03-13 MED ORDER — KETOROLAC TROMETHAMINE 30 MG/ML IJ SOLN
30.0000 mg | Freq: Four times a day (QID) | INTRAMUSCULAR | Status: DC | PRN
  Administered 2022-03-13 – 2022-03-15 (×6): 30 mg via INTRAVENOUS
  Filled 2022-03-13 (×6): qty 1

## 2022-03-13 NOTE — Plan of Care (Signed)

## 2022-03-13 NOTE — Progress Notes (Signed)
Subjective:  CC: Amy Huynh is a 66 y.o. female  Hospital stay day 2, 2 Days Post-Op ex-lap, mesentery biopsy  HPI: No acute issues overnight.  No flatus, BM.  Denies appetite.  Pain manageable but still an issue.  Low-grade temp.  Has walked more.  Husband and son at bedside.    Objective:   Temp:  [97.7 F (36.5 C)-100.5 F (38.1 C)] 97.9 F (36.6 C) (10/28 0508) Pulse Rate:  [90-105] 90 (10/28 0508) Resp:  [17-20] 20 (10/28 0508) BP: (108-146)/(60-87) 108/68 (10/28 0508) SpO2:  [96 %-98 %] 98 % (10/28 0508)     Height: 5\' 2"  (157.5 cm) Weight: 79.4 kg BMI (Calculated): 32   Intake/Output this shift:   Intake/Output Summary (Last 24 hours) at 03/13/2022 0650 Last data filed at 03/13/2022 0606 Gross per 24 hour  Intake 0 ml  Output 20 ml  Net -20 ml     Constitutional :  alert, cooperative, appears stated age, and no distress  Respiratory:  clear to auscultation bilaterally  Cardiovascular:  regular rate and rhythm  Gastrointestinal: Soft, TTP around port sites and midline incision .   Skin: Cool and moist. Incision C/D/I  Psychiatric: Normal affect, non-agitated, not confused       LABS:     Latest Ref Rng & Units 03/12/2022    5:06 AM 03/11/2022    7:03 AM 03/10/2022   11:02 PM  CMP  Glucose 70 - 99 mg/dL 99   127   BUN 8 - 23 mg/dL 8   11   Creatinine 0.44 - 1.00 mg/dL 0.95  1.08  1.12   Sodium 135 - 145 mmol/L 141   140   Potassium 3.5 - 5.1 mmol/L 3.7   3.4   Chloride 98 - 111 mmol/L 114   112   CO2 22 - 32 mmol/L 18   24   Calcium 8.9 - 10.3 mg/dL 8.7   8.7   Total Protein 6.5 - 8.1 g/dL   6.6   Total Bilirubin 0.3 - 1.2 mg/dL   0.6   Alkaline Phos 38 - 126 U/L   66   AST 15 - 41 U/L   18   ALT 0 - 44 U/L   11       Latest Ref Rng & Units 03/12/2022    5:06 AM 03/11/2022    7:03 AM 03/10/2022   11:02 PM  CBC  WBC 4.0 - 10.5 K/uL 9.1  10.1  5.2   Hemoglobin 12.0 - 15.0 g/dL 12.2  11.7  11.4   Hematocrit 36.0 - 46.0 % 39.4  38.7   36.8   Platelets 150 - 400 K/uL 230  237  246     RADS: N/a Assessment:   S/p ex-lap small bowel mesentary biopsy secondary to concern for mesenteric volvulus.  As expected.   Encouraged ambulation, gum, hard candy. We will hold diet at present level, will advance when more evidence for return of bowel function. labs/images/medications/previous chart entries reviewed personally and relevant changes/updates noted above.

## 2022-03-13 NOTE — Progress Notes (Signed)
PHARMACIST - PHYSICIAN COMMUNICATION  CONCERNING: IV to Oral Route Change Policy  RECOMMENDATION: This patient is receiving pantoprazole by the intravenous route.  Based on criteria approved by the Pharmacy and Therapeutics Committee, the intravenous medication(s) is/are being converted to the equivalent oral dose form(s).   DESCRIPTION: These criteria include: The patient is eating (either orally or via tube) and/or has been taking other orally administered medications for a least 24 hours The patient has no evidence of active gastrointestinal bleeding or impaired GI absorption (gastrectomy, short bowel, patient on TNA or NPO).  If you have questions about this conversion, please contact the [place] 03/13/2022 12:03 PM

## 2022-03-14 MED ORDER — GUAIFENESIN ER 600 MG PO TB12
600.0000 mg | ORAL_TABLET | Freq: Two times a day (BID) | ORAL | Status: DC
Start: 2022-03-14 — End: 2022-03-15
  Administered 2022-03-14 – 2022-03-15 (×3): 600 mg via ORAL
  Filled 2022-03-14 (×3): qty 1

## 2022-03-14 MED ORDER — GUAIFENESIN 100 MG/5ML PO LIQD
5.0000 mL | ORAL | Status: DC | PRN
  Administered 2022-03-14 – 2022-03-15 (×4): 5 mL via ORAL
  Filled 2022-03-14 (×4): qty 10

## 2022-03-14 NOTE — Progress Notes (Signed)
Subjective:  CC: Amy Huynh is a 66 y.o. female  Hospital stay day 3, 3 Days Post-Op ex-lap, mesentery biopsy  HPI: No acute issues overnight.  More flatus..  Improved appetite.  Pain manageable.  Didn't walk yesterday.      Objective:   Temp:  [97.6 F (36.4 C)-98.2 F (36.8 C)] 98.2 F (36.8 C) (10/29 0537) Pulse Rate:  [69-76] 76 (10/29 0537) Resp:  [17-18] 18 (10/29 0537) BP: (103-120)/(45-61) 115/56 (10/29 0537) SpO2:  [96 %-100 %] 98 % (10/29 0537)     Height: 5\' 2"  (157.5 cm) Weight: 79.4 kg BMI (Calculated): 32   Intake/Output this shift:   Intake/Output Summary (Last 24 hours) at 03/14/2022 0726 Last data filed at 03/14/2022 0548 Gross per 24 hour  Intake 1600 ml  Output --  Net 1600 ml     Constitutional :  alert, cooperative, appears stated age, and no distress  Respiratory:  clear to auscultation bilaterally  Cardiovascular:  regular rate and rhythm  Gastrointestinal: Soft, TTP around port sites and midline incision .   Skin: Cool and moist. Incision C/D/I  Psychiatric: Normal affect, non-agitated, not confused       LABS:     Latest Ref Rng & Units 03/13/2022    5:53 AM 03/12/2022    5:06 AM 03/11/2022    7:03 AM  CMP  Glucose 70 - 99 mg/dL 94  99    BUN 8 - 23 mg/dL 10  8    Creatinine 0.44 - 1.00 mg/dL 0.92  0.95  1.08   Sodium 135 - 145 mmol/L 141  141    Potassium 3.5 - 5.1 mmol/L 3.6  3.7    Chloride 98 - 111 mmol/L 113  114    CO2 22 - 32 mmol/L 22  18    Calcium 8.9 - 10.3 mg/dL 8.6  8.7        Latest Ref Rng & Units 03/13/2022    5:53 AM 03/12/2022    5:06 AM 03/11/2022    7:03 AM  CBC  WBC 4.0 - 10.5 K/uL 8.0  9.1  10.1   Hemoglobin 12.0 - 15.0 g/dL 10.4  12.2  11.7   Hematocrit 36.0 - 46.0 % 33.8  39.4  38.7   Platelets 150 - 400 K/uL 197  230  237     RADS: N/a Assessment:   S/p ex-lap small bowel mesentary biopsy secondary to concern for mesenteric volvulus.  As expected.   Advancing diet, fulls and onward.    labs/images/medications/previous chart entries reviewed personally and relevant changes/updates noted above.

## 2022-03-15 ENCOUNTER — Encounter: Payer: Self-pay | Admitting: Surgery

## 2022-03-15 ENCOUNTER — Inpatient Hospital Stay: Payer: Medicare PPO

## 2022-03-15 MED ORDER — OXYCODONE-ACETAMINOPHEN 5-325 MG PO TABS: 1.0000 | ORAL_TABLET | Freq: Three times a day (TID) | ORAL | 0 refills | Status: DC | PRN

## 2022-03-15 MED ORDER — DOCUSATE SODIUM 100 MG PO CAPS: 100.0000 mg | ORAL_CAPSULE | Freq: Two times a day (BID) | ORAL | 0 refills | Status: AC | PRN

## 2022-03-15 MED ORDER — PANTOPRAZOLE SODIUM 40 MG PO TBEC: 40.0000 mg | DELAYED_RELEASE_TABLET | Freq: Two times a day (BID) | ORAL | 1 refills | Status: DC

## 2022-03-15 NOTE — Progress Notes (Signed)
Mobility Specialist - Progress Note   03/15/22 1145  Mobility  Activity Ambulated independently in hallway  Level of Assistance Modified independent, requires aide device or extra time  Assistive Device Front wheel walker  Distance Ambulated (ft) 10 ft  Activity Response Tolerated well  Mobility Referral Yes  $Mobility charge 1 Mobility   Pt ambulating upon entry, utilizing RA. Pt  ambulated around the room,donned clothes and returned to bed awaiting D/C. No complaints.   Candie Mile Mobility Specialist 03/15/22 11:47 AM

## 2022-03-15 NOTE — Discharge Instructions (Signed)
Removal, Care After This sheet gives you information about how to care for yourself after your procedure. Your health care provider may also give you more specific instructions. If you have problems or questions, contact your health care provider. What can I expect after the procedure? After the procedure, it is common to have: Soreness. Bruising. Itching. Follow these instructions at home: site care Follow instructions from your health care provider about how to take care of your site. Make sure you: Wash your hands with soap and water before and after you change your bandage (dressing). If soap and water are not available, use hand sanitizer. Leave stitches (sutures), skin glue, or adhesive strips in place. These skin closures may need to stay in place for 2 weeks or longer. If adhesive strip edges start to loosen and curl up, you may trim the loose edges. Do not remove adhesive strips completely unless your health care provider tells you to do that. If the area bleeds or bruises, apply gentle pressure for 10 minutes. OK TO SHOWER IN 24HRS  Check your site every day for signs of infection. Check for: Redness, swelling, or pain. Fluid or blood. Warmth. Pus or a bad smell.  General instructions Rest and then return to your normal activities as told by your health care provider.  tylenol and advil as needed for discomfort.  Please alternate between the two every four hours as needed for pain.    Use narcotics, if prescribed, only when tylenol and motrin is not enough to control pain.  325-650mg every 8hrs to max of 3000mg/24hrs (including the 325mg in every norco dose) for the tylenol.    Advil up to 800mg per dose every 8hrs as needed for pain.   Keep all follow-up visits as told by your health care provider. This is important. Contact a health care provider if: You have redness, swelling, or pain around your site. You have fluid or blood coming from your site. Your site feels warm to  the touch. You have pus or a bad smell coming from your site. You have a fever. Your sutures, skin glue, or adhesive strips loosen or come off sooner than expected. Get help right away if: You have bleeding that does not stop with pressure or a dressing. Summary After the procedure, it is common to have some soreness, bruising, and itching at the site. Follow instructions from your health care provider about how to take care of your site. Check your site every day for signs of infection. Contact a health care provider if you have redness, swelling, or pain around your site, or your site feels warm to the touch. Keep all follow-up visits as told by your health care provider. This is important. This information is not intended to replace advice given to you by your health care provider. Make sure you discuss any questions you have with your health care provider. Document Released: 05/30/2015 Document Revised: 10/31/2017 Document Reviewed: 10/31/2017 Elsevier Interactive Patient Education  2019 [employer]   

## 2022-03-15 NOTE — Care Management Important Message (Signed)
Important Message  Patient Details  Name: Amy Huynh MRN: [id number] Date of Birth: [birth year]   Medicare Important Message Given:  Yes     Dannette Barbara 03/15/2022, 11:50 AM

## 2022-03-15 NOTE — Progress Notes (Signed)
Pt discharged per MD order.  IV removed. Discharge instructions reviewed with pt. Pt verbalized understanding. All questions answered to pt satisfaction. Pt taken out in wheelchair by staff.  

## 2022-03-16 NOTE — Discharge Summary (Signed)
Physician Discharge Summary  Patient ID: Amy Huynh MRN: [id number] DOB/AGE: 1955-08-24 66 y.o.  Admit date: 03/10/2022 Discharge date: 03/15/22  Admission Diagnoses: mesenteric volvulus  Discharge Diagnoses:  Mesentary mass  Discharged Condition: good  Hospital Course: admitted for above. Underwent ex-lap surgery, noted to have mass rather than true mesenteric volvulus at area of concern noted on pre-op CT.  Please see op note for details.  Post op, recovered as expected.  At time of d/c, tolerating diet and pain controlled. Path still pending.  F/u in office for staple removal and discuss path results when available.  Consults: None  Discharge Exam: Blood pressure 125/74, pulse 71, temperature 98.6 F (37 C), temperature source Oral, resp. rate 14, height 5\' 2"  (1.575 m), weight 79.4 kg, SpO2 99 %. General appearance: alert and no distress GI: soft, minimal TTP as expected along incision line, c/d/i  Disposition:  Discharge disposition: 01-Home or Self Care       Discharge Instructions     Discharge patient   Complete by: As directed    Discharge disposition: 01-Home or Self Care   Discharge patient date: 03/15/2022      Allergies as of 03/15/2022       Reactions   Hydrocodone-acetaminophen    Other reaction(s): Other (See Comments) Severe headache   Hydrocodone-acetaminophen    Other reaction(s): Headache, Other (See Comments) Severe headache Severe headache Other reaction(s): Headache Severe headache Other reaction(s): Headache, Other (See Comments) Severe headache Severe headache   Morphine Itching, Other (See Comments)   Other reaction(s): Other (See Comments) Morphine in high doses causes itching & red face. Morphine in high doses causes itching & red face. Morphine in high doses causes itching & red face. Other reaction(s): Other (See Comments) Morphine in high doses causes itching & red face. Morphine in high doses causes itching &  red face. Morphine in high doses causes itching & red face.   Morphine And Related Itching   Morphine in high doses causes itching & red face.   Cymbalta [duloxetine Hcl] Other (See Comments)   Serotonin Reaction   Doxycycline    Other reaction(s): Abdominal Pain GI intolerance   Duloxetine Other (See Comments)   Other reaction(s): Other (See Comments), Other (See Comments) Serotonin syndrome Serotonin reaction Serotonin syndrome Other reaction(s): Other (See Comments), Other (See Comments) Serotonin syndrome Serotonin reaction   Pregabalin Other (See Comments)   Other reaction(s): Other (See Comments) Serotinon reaction serotinon reaction Serotinon reaction Other reaction(s): Other (See Comments) Serotinon reaction serotinon reaction        Medication List     STOP taking these medications    VITAMIN D (CHOLECALCIFEROL) PO       TAKE these medications    butalbital-acetaminophen-caffeine 50-325-40 MG tablet Commonly known as: FIORICET Take 1 tablet by mouth every 6 (six) hours as needed for headache.   cetirizine 10 MG tablet Commonly known as: ZYRTEC Take 10 mg by mouth daily.   clobetasol cream 0.05 % Commonly known as: TEMOVATE Apply topically.   cyanocobalamin 1000 MCG tablet Take 1,000 mcg by mouth daily.   dexlansoprazole 60 MG capsule Commonly known as: DEXILANT Take 1 capsule (60 mg total) by mouth daily.   docusate sodium 100 MG capsule Commonly known as: Colace Take 1 capsule (100 mg total) by mouth 2 (two) times daily as needed for up to 10 days for mild constipation.   fexofenadine 180 MG tablet Commonly known as: ALLEGRA Take 180 mg by mouth daily.   folic  acid 1 MG tablet Commonly known as: FOLVITE Take 1 mg by mouth daily.   gabapentin 300 MG capsule Commonly known as: NEURONTIN Take 900 mg by mouth at bedtime.   hydrOXYzine 10 MG tablet Commonly known as: ATARAX Take 10 mg by mouth every 8 (eight) hours as needed.    hyoscyamine 0.125 MG tablet Commonly known as: LEVSIN Take 0.125 mg by mouth every 4 (four) hours as needed.   methocarbamol 500 MG tablet Commonly known as: ROBAXIN Take 500 mg by mouth at bedtime.   methotrexate 2.5 MG tablet Commonly known as: RHEUMATREX Take 20 mg by mouth once a week. Take 4 tablets (10 mg ) by mouth in the morning and 4 tablets(10 mg by mouth in the evening on Saturdays (total= 20 mg)   Otezla 30 MG Tabs Generic drug: Apremilast Take 30 mg by mouth 2 (two) times daily.   oxyCODONE-acetaminophen 5-325 MG tablet Commonly known as: Percocet Take 1 tablet by mouth every 8 (eight) hours as needed for severe pain.   pantoprazole 40 MG tablet Commonly known as: PROTONIX Take 1 tablet (40 mg total) by mouth 2 (two) times daily.   pilocarpine 5 MG tablet Commonly known as: SALAGEN Take 5 mg by mouth 3 (three) times daily.   promethazine 25 MG tablet Commonly known as: PHENERGAN Take 25 mg by mouth every 6 (six) hours as needed for nausea or vomiting.   rizatriptan 10 MG tablet Commonly known as: MAXALT Take 10 mg by mouth as needed for migraine. May repeat in 2 hours if needed   rOPINIRole 2 MG tablet Commonly known as: REQUIP Take 2 mg by mouth at bedtime.   rOPINIRole 0.5 MG tablet Commonly known as: REQUIP Take 1 tablet by mouth at bedtime.   topiramate 100 MG tablet Commonly known as: TOPAMAX Take 200 mg by mouth daily.   valACYclovir 1000 MG tablet Commonly known as: VALTREX Take 1 tablet once a day by mouth   Vyepti 100 MG/ML injection Generic drug: Eptinezumab-jjmr Inject 100 mg into the vein every 3 (three) months. Last Saturday        Follow-up Information     Vandiver, Bebe Moncure, DO. Go on 03/23/2022.   Specialty: Surgery Why: post op staple removal 1:45 Contact information: [address]  [place] [id number] [phone]                  Total time spent arranging discharge was >36min. Signed: Benjamine Sprague 03/16/2022, 8:20 AM

## 2022-03-17 LAB — SURGICAL PATHOLOGY

## 2022-04-06 ENCOUNTER — Other Ambulatory Visit: Payer: Self-pay | Admitting: Surgery

## 2022-04-06 DIAGNOSIS — R918 Other nonspecific abnormal finding of lung field: Secondary | ICD-10-CM

## 2022-04-07 ENCOUNTER — Ambulatory Visit
Admission: RE | Admit: 2022-04-07 | Discharge: 2022-04-07 | Disposition: A | Discharge status: Home / Self Care | Payer: Medicare PPO | Source: Ambulatory Visit | Attending: Surgery | Admitting: Surgery

## 2022-04-07 DIAGNOSIS — R918 Other nonspecific abnormal finding of lung field: Secondary | ICD-10-CM | POA: Diagnosis present

## 2022-04-13 ENCOUNTER — Other Ambulatory Visit: Payer: Self-pay | Admitting: Gastroenterology

## 2022-04-22 ENCOUNTER — Other Ambulatory Visit: Payer: Self-pay | Admitting: Surgery

## 2022-04-22 DIAGNOSIS — R1012 Left upper quadrant pain: Secondary | ICD-10-CM

## 2022-04-23 ENCOUNTER — Ambulatory Visit
Admission: RE | Admit: 2022-04-23 | Discharge: 2022-04-23 | Disposition: A | Discharge status: Home / Self Care | Payer: Medicare PPO | Source: Ambulatory Visit | Attending: Surgery | Admitting: Surgery

## 2022-04-23 ENCOUNTER — Other Ambulatory Visit: Payer: Self-pay | Admitting: Pulmonary Disease

## 2022-04-23 DIAGNOSIS — R1012 Left upper quadrant pain: Secondary | ICD-10-CM | POA: Diagnosis present

## 2022-04-23 DIAGNOSIS — R7989 Other specified abnormal findings of blood chemistry: Secondary | ICD-10-CM

## 2022-04-23 MED ORDER — IOHEXOL 300 MG/ML  SOLN
100.0000 mL | Freq: Once | INTRAMUSCULAR | Status: AC | PRN
  Administered 2022-04-23: 100 mL via INTRAVENOUS

## 2022-04-26 ENCOUNTER — Encounter: Payer: Medicare PPO | Attending: Pulmonary Disease | Admitting: *Deleted

## 2022-04-26 ENCOUNTER — Other Ambulatory Visit: Payer: Self-pay

## 2022-04-26 ENCOUNTER — Encounter: Payer: Self-pay | Admitting: *Deleted

## 2022-04-26 DIAGNOSIS — R0600 Dyspnea, unspecified: Secondary | ICD-10-CM

## 2022-04-26 NOTE — Progress Notes (Signed)
Virtual Visit completed. Patient informed on EP and RD appointment and 6 Minute walk test. Patient also informed of patient health questionnaires on My Chart. Patient Verbalizes understanding. Visit diagnosis can be found in [facility] 04/12/2022.  

## 2022-04-27 VITALS — Ht 64.0 in | Wt 166.7 lb

## 2022-04-27 DIAGNOSIS — R0600 Dyspnea, unspecified: Secondary | ICD-10-CM | POA: Diagnosis not present

## 2022-04-27 NOTE — Patient Instructions (Signed)
Patient Instructions  Patient Details  Name: Amy Huynh MRN: [id number] Date of Birth: [birth year] Referring Provider:  Vida Rigger, MD  Below are your personal goals for exercise, nutrition, and risk factors. Our goal is to help you stay on track towards obtaining and maintaining these goals. We will be discussing your progress on these goals with you throughout the program.  Initial Exercise Prescription:  Initial Exercise Prescription - 04/27/22 1400       Date of Initial Exercise RX and Referring Provider   Date 04/27/22    Referring Provider Dr Vida Rigger      Oxygen   Maintain Oxygen Saturation 88% or higher      Recumbant Bike   Level 1    RPM 50    Watts 15    Minutes 15    METs 2.15      NuStep   Level 2    SPM 80    Minutes 15    METs 2.15      REL-XR   Level 1    Watts 15    Speed 50    Minutes 15    METs 2.15      Track   Laps 20    Minutes 15    METs 2.09      Prescription Details   Frequency (times per week) 3    Duration Progress to 30 minutes of continuous aerobic without signs/symptoms of physical distress      Intensity   THRR 40-80% of Max Heartrate 102-138    Ratings of Perceived Exertion 11-13    Perceived Dyspnea 0-4      Progression   Progression Continue to progress workloads to maintain intensity without signs/symptoms of physical distress.      Resistance Training   Training Prescription Yes    Weight 2 lb    Reps 10-15             Exercise Goals: Frequency: Be able to perform aerobic exercise two to three times per week in program working toward 2-5 days per week of home exercise.  Intensity: Work with a perceived exertion of 11 (fairly light) - 15 (hard) while following your exercise prescription.  We will make changes to your prescription with you as you progress through the program.   Duration: Be able to do 30 to 45 minutes of continuous aerobic exercise in addition to a 5 minute warm-up and a  5 minute cool-down routine.   Nutrition Goals: Your personal nutrition goals will be established when you do your nutrition analysis with the dietician.  The following are general nutrition guidelines to follow: Cholesterol < 200mg /day Sodium < 1500mg /day Fiber: Men over 50 yrs - 30 grams per day  Personal Goals:  Personal Goals and Risk Factors at Admission - 04/27/22 1424       Core Components/Risk Factors/Patient Goals on Admission    Weight Management Yes;Weight Loss    Intervention Weight Management: Develop a combined nutrition and exercise program designed to reach desired caloric intake, while maintaining appropriate intake of nutrient and fiber, sodium and fats, and appropriate energy expenditure required for the weight goal.;Weight Management: Provide education and appropriate resources to help participant work on and attain dietary goals.;Weight Management/Obesity: Establish reasonable short term and long term weight goals.    Admit Weight 166 lb 11.2 oz (75.6 kg)    Goal Weight: Short Term 160 lb (72.6 kg)    Goal Weight: Long Term 155 lb (70.3 kg)  Expected Outcomes Short Term: Continue to assess and modify interventions until short term weight is achieved;Long Term: Adherence to nutrition and physical activity/exercise program aimed toward attainment of established weight goal;Weight Loss: Understanding of general recommendations for a balanced deficit meal plan, which promotes 1-2 lb weight loss per week and includes a negative energy balance of [phone] kcal/d;Understanding recommendations for meals to include 15-35% energy as protein, 25-35% energy from fat, 35-60% energy from carbohydrates, less than 200mg  of dietary cholesterol, 20-35 gm of total fiber daily;Understanding of distribution of calorie intake throughout the day with the consumption of 4-5 meals/snacks    Improve shortness of breath with ADL's Yes    Intervention Provide education, individualized exercise plan  and daily activity instruction to help decrease symptoms of SOB with activities of daily living.    Expected Outcomes Short Term: Improve cardiorespiratory fitness to achieve a reduction of symptoms when performing ADLs;Long Term: Be able to perform more ADLs without symptoms or delay the onset of symptoms    Hypertension Yes    Intervention Provide education on lifestyle modifcations including regular physical activity/exercise, weight management, moderate sodium restriction and increased consumption of fresh fruit, vegetables, and low fat dairy, alcohol moderation, and smoking cessation.;Monitor prescription use compliance.    Expected Outcomes Short Term: Continued assessment and intervention until BP is < 140/85mm HG in hypertensive participants. < 130/35mm HG in hypertensive participants with diabetes, heart failure or chronic kidney disease.;Long Term: Maintenance of blood pressure at goal levels.            Exercise Goals and Review:  Exercise Goals     Row Name 04/27/22 1449             Exercise Goals   Increase Physical Activity Yes       Intervention Provide advice, education, support and counseling about physical activity/exercise needs.;Develop an individualized exercise prescription for aerobic and resistive training based on initial evaluation findings, risk stratification, comorbidities and participant's personal goals.       Expected Outcomes Short Term: Attend rehab on a regular basis to increase amount of physical activity.;Long Term: Add in home exercise to make exercise part of routine and to increase amount of physical activity.;Long Term: Exercising regularly at least 3-5 days a week.       Increase Strength and Stamina Yes       Intervention Provide advice, education, support and counseling about physical activity/exercise needs.;Develop an individualized exercise prescription for aerobic and resistive training based on initial evaluation findings, risk stratification,  comorbidities and participant's personal goals.       Expected Outcomes Short Term: Perform resistance training exercises routinely during rehab and add in resistance training at home;Short Term: Increase workloads from initial exercise prescription for resistance, speed, and METs.;Long Term: Improve cardiorespiratory fitness, muscular endurance and strength as measured by increased METs and functional capacity (14/12/23)       Able to understand and use rate of perceived exertion (RPE) scale Yes       Intervention Provide education and explanation on how to use RPE scale       Expected Outcomes Short Term: Able to use RPE daily in rehab to express subjective intensity level;Long Term:  Able to use RPE to guide intensity level when exercising independently       Able to understand and use Dyspnea scale Yes       Intervention Provide education and explanation on how to use Dyspnea scale       Expected Outcomes  Long Term: Able to use Dyspnea scale to guide intensity level when exercising independently;Short Term: Able to use Dyspnea scale daily in rehab to express subjective sense of shortness of breath during exertion       Knowledge and understanding of Target Heart Rate Range (THRR) Yes       Intervention Provide education and explanation of THRR including how the numbers were predicted and where they are located for reference       Expected Outcomes Short Term: Able to state/look up THRR;Long Term: Able to use THRR to govern intensity when exercising independently;Short Term: Able to use daily as guideline for intensity in rehab       Able to check pulse independently Yes       Intervention Provide education and demonstration on how to check pulse in carotid and radial arteries.;Review the importance of being able to check your own pulse for safety during independent exercise       Expected Outcomes Short Term: Able to explain why pulse checking is important during independent exercise;Long Term: Able to  check pulse independently and accurately       Understanding of Exercise Prescription Yes       Intervention Provide education, explanation, and written materials on patient's individual exercise prescription       Expected Outcomes Short Term: Able to explain program exercise prescription;Long Term: Able to explain home exercise prescription to exercise independently

## 2022-04-27 NOTE — Progress Notes (Signed)
Pulmonary Individual Treatment Plan  Patient Details  Name: Milley Vining MRN: [id number] Date of Birth: 01/24/1956 Referring Provider:   Flowsheet Row Pulmonary Rehab from 04/27/2022 in [facility] Cardiac and Pulmonary Rehab  Referring Provider Dr Vida Rigger       Initial Encounter Date:  Flowsheet Row Pulmonary Rehab from 04/27/2022 in [facility] Cardiac and Pulmonary Rehab  Date 04/27/22       Visit Diagnosis: Dyspnea, unspecified type  Patient's Home Medications on Admission:  Current Outpatient Medications:    Apremilast (OTEZLA) 30 MG TABS, Take 30 mg by mouth 2 (two) times daily., Disp: , Rfl:    butalbital-acetaminophen-caffeine (FIORICET, ESGIC) 50-325-40 MG tablet, Take 1 tablet by mouth every 6 (six) hours as needed for headache., Disp: , Rfl:    cetirizine (ZYRTEC) 10 MG tablet, Take 10 mg by mouth daily., Disp: , Rfl:    clobetasol cream (TEMOVATE) 0.05 %, Apply topically., Disp: , Rfl:    cyanocobalamin 1000 MCG tablet, Take 1,000 mcg by mouth daily., Disp: , Rfl:    dexlansoprazole (DEXILANT) 60 MG capsule, TAKE 1 CAPSULE(60 MG) BY MOUTH DAILY, Disp: 90 capsule, Rfl: 0   Eptinezumab-jjmr (VYEPTI) 100 MG/ML injection, Inject 100 mg into the vein every 3 (three) months. Last Saturday, Disp: , Rfl:    fexofenadine (ALLEGRA) 180 MG tablet, Take 180 mg by mouth daily., Disp: , Rfl:    folic acid (FOLVITE) 1 MG tablet, Take 1 mg by mouth daily., Disp: , Rfl:    gabapentin (NEURONTIN) 300 MG capsule, Take 900 mg by mouth at bedtime., Disp: , Rfl:    hydrOXYzine (ATARAX/VISTARIL) 10 MG tablet, Take 10 mg by mouth every 8 (eight) hours as needed., Disp: , Rfl:    hyoscyamine (LEVSIN, ANASPAZ) 0.125 MG tablet, Take 0.125 mg by mouth every 4 (four) hours as needed. (Patient not taking: Reported on 04/26/2022), Disp: , Rfl:    methocarbamol (ROBAXIN) 500 MG tablet, Take 500 mg by mouth at bedtime. , Disp: , Rfl:    methotrexate (RHEUMATREX) 2.5 MG tablet, Take 20 mg by mouth  once a week. Take 4 tablets (10 mg ) by mouth in the morning and 4 tablets(10 mg by mouth in the evening on Saturdays (total= 20 mg), Disp: , Rfl:    oxyCODONE-acetaminophen (PERCOCET) 5-325 MG tablet, Take 1 tablet by mouth every 8 (eight) hours as needed for severe pain. (Patient not taking: Reported on 04/26/2022), Disp: 15 tablet, Rfl: 0   pantoprazole (PROTONIX) 40 MG tablet, Take 1 tablet (40 mg total) by mouth 2 (two) times daily. (Patient not taking: Reported on 04/26/2022), Disp: 180 tablet, Rfl: 1   pilocarpine (SALAGEN) 5 MG tablet, Take 5 mg by mouth 3 (three) times daily., Disp: , Rfl:    promethazine (PHENERGAN) 25 MG tablet, Take 25 mg by mouth every 6 (six) hours as needed for nausea or vomiting., Disp: , Rfl:    rizatriptan (MAXALT) 10 MG tablet, Take 10 mg by mouth as needed for migraine. May repeat in 2 hours if needed, Disp: , Rfl:    rizatriptan (MAXALT-MLT) 10 MG disintegrating tablet, Take by mouth., Disp: , Rfl:    rOPINIRole (REQUIP) 0.5 MG tablet, Take 1 tablet by mouth at bedtime. , Disp: , Rfl:    rOPINIRole (REQUIP) 2 MG tablet, Take 2 mg by mouth at bedtime., Disp: , Rfl:    topiramate (TOPAMAX) 100 MG tablet, Take 200 mg by mouth daily., Disp: , Rfl:    topiramate (TOPAMAX) 100 MG tablet, Take  2 tablets by mouth daily. (Patient not taking: Reported on 04/26/2022), Disp: , Rfl:    valACYclovir (VALTREX) 1000 MG tablet, Take 1 tablet once a day by mouth (Patient not taking: Reported on 04/26/2022), Disp: , Rfl:   Past Medical History: Past Medical History:  Diagnosis Date   Arthritis    Family history of ovarian cancer 12/2016   MyRisk neg   Fibromyalgia    Migraine    Neuropathy    Placenta previa    Sjogren's disease (HCC)     Tobacco Use: Social History   Tobacco Use  Smoking Status Never  Smokeless Tobacco Never    Labs: Review Flowsheet        No data to display           Pulmonary Assessment Scores:  Pulmonary Assessment Scores      Row Name 04/27/22 1425         ADL UCSD   ADL Phase Entry     SOB Score total 57     Rest 0     Walk 3     Stairs 5     Bath 3     Dress 2     Shop 4       CAT Score   CAT Score 18       mMRC Score   mMRC Score 1              UCSD: Self-administered rating of dyspnea associated with activities of daily living (ADLs) 6-point scale (0 = "not at all" to 5 = "maximal or unable to do because of breathlessness")  Scoring Scores range from 0 to 120.  Minimally important difference is 5 units  CAT: CAT can identify the health impairment of COPD patients and is better correlated with disease progression.  CAT has a scoring range of zero to 40. The CAT score is classified into four groups of low (less than 10), medium (10 - 20), high (21-30) and very high (31-40) based on the impact level of disease on health status. A CAT score over 10 suggests significant symptoms.  A worsening CAT score could be explained by an exacerbation, poor medication adherence, poor inhaler technique, or progression of COPD or comorbid conditions.  CAT MCID is 2 points  mMRC: mMRC (Modified Medical Research Council) Dyspnea Scale is used to assess the degree of baseline functional disability in patients of respiratory disease due to dyspnea. No minimal important difference is established. A decrease in score of 1 point or greater is considered a positive change.   Pulmonary Function Assessment:  Pulmonary Function Assessment - 04/26/22 1112       Breath   Shortness of Breath Yes;Limiting activity             Exercise Target Goals: Exercise Program Goal: Individual exercise prescription set using results from initial 6 min walk test and THRR while considering  patient's activity barriers and safety.   Exercise Prescription Goal: Initial exercise prescription builds to 30-45 minutes a day of aerobic activity, 2-3 days per week.  Home exercise guidelines will be given to patient during program as  part of exercise prescription that the participant will acknowledge.  Education: Aerobic Exercise: - Group verbal and visual presentation on the components of exercise prescription. Introduces F.I.T.T principle from ACSM for exercise prescriptions.  Reviews F.I.T.T. principles of aerobic exercise including progression. Written material given at graduation.   Education: Resistance Exercise: - Group verbal and visual presentation on the  components of exercise prescription. Introduces F.I.T.T principle from ACSM for exercise prescriptions  Reviews F.I.T.T. principles of resistance exercise including progression. Written material given at graduation.    Education: Exercise & Equipment Safety: - Individual verbal instruction and demonstration of equipment use and safety with use of the equipment. Flowsheet Row Pulmonary Rehab from 04/27/2022 in [facility] Cardiac and Pulmonary Rehab  Date 04/26/22  Educator [facility]  Instruction Review Code 1- Verbalizes Understanding       Education: Exercise Physiology & General Exercise Guidelines: - Group verbal and written instruction with models to review the exercise physiology of the cardiovascular system and associated critical values. Provides general exercise guidelines with specific guidelines to those with heart or lung disease.    Education: Flexibility, Balance, Mind/Body Relaxation: - Group verbal and visual presentation with interactive activity on the components of exercise prescription. Introduces F.I.T.T principle from ACSM for exercise prescriptions. Reviews F.I.T.T. principles of flexibility and balance exercise training including progression. Also discusses the mind body connection.  Reviews various relaxation techniques to help reduce and manage stress (i.e. Deep breathing, progressive muscle relaxation, and visualization). Balance handout provided to take home. Written material given at graduation.   Activity Barriers & Risk Stratification:   Activity Barriers & Cardiac Risk Stratification - 04/27/22 1438       Activity Barriers & Cardiac Risk Stratification   Activity Barriers Shortness of Breath;Left Knee Replacement;Other (comment);Back Problems    Comments Neuropathy in feet, Hip Pain             6 Minute Walk:  6 Minute Walk     Row Name 04/27/22 1433         6 Minute Walk   Phase Initial     Distance 800 feet     Walk Time 6 minutes     # of Rest Breaks 0     MPH 1.52     METS 2.15     RPE 13     Perceived Dyspnea  2     VO2 Peak 7.52     Symptoms Yes (comment)     Comments SOB, Hip Pain 4/10     Resting HR 66 bpm     Resting BP 116/62     Resting Oxygen Saturation  98 %     Exercise Oxygen Saturation  during 6 min walk 96 %     Max Ex. HR 96 bpm     Max Ex. BP 124/66     2 Minute Post BP 104/64       Interval HR   1 Minute HR 96     2 Minute HR 96     3 Minute HR 96     4 Minute HR 94     5 Minute HR 94     6 Minute HR 90     2 Minute Post HR 69     Interval Heart Rate? Yes       Interval Oxygen   Interval Oxygen? Yes     Baseline Oxygen Saturation % 98 %     1 Minute Oxygen Saturation % 98 %     1 Minute Liters of Oxygen 0 L  RA     2 Minute Oxygen Saturation % 97 %     2 Minute Liters of Oxygen 0 L  RA     3 Minute Oxygen Saturation % 96 %     3 Minute Liters of Oxygen 0 L  RA  4 Minute Oxygen Saturation % 98 %     4 Minute Liters of Oxygen 0 L  RA     5 Minute Oxygen Saturation % 99 %     5 Minute Liters of Oxygen 0 L  RA     6 Minute Oxygen Saturation % 98 %     6 Minute Liters of Oxygen 0 L  RA     2 Minute Post Oxygen Saturation % 99 %     2 Minute Post Liters of Oxygen 0 L  RA             Oxygen Initial Assessment:  Oxygen Initial Assessment - 04/26/22 1112       Home Oxygen   Home Oxygen Device None    Sleep Oxygen Prescription None    Home Exercise Oxygen Prescription None    Home Resting Oxygen Prescription None      Initial 6 min Walk   Oxygen Used  None      Program Oxygen Prescription   Program Oxygen Prescription None      Intervention   Short Term Goals To learn and demonstrate proper pursed lip breathing techniques or other breathing techniques. ;To learn and understand importance of monitoring SPO2 with pulse oximeter and demonstrate accurate use of the pulse oximeter.;To learn and understand importance of maintaining oxygen saturations>88%;To learn and exhibit compliance with exercise, home and travel O2 prescription    Long  Term Goals Exhibits compliance with exercise, home  and travel O2 prescription;Maintenance of O2 saturations>88%;Demonstrates proper use of MDI's;Exhibits proper breathing techniques, such as pursed lip breathing or other method taught during program session;Verbalizes importance of monitoring SPO2 with pulse oximeter and return demonstration             Oxygen Re-Evaluation:   Oxygen Discharge (Final Oxygen Re-Evaluation):   Initial Exercise Prescription:  Initial Exercise Prescription - 04/27/22 1400       Date of Initial Exercise RX and Referring Provider   Date 04/27/22    Referring Provider Dr Vida Rigger      Oxygen   Maintain Oxygen Saturation 88% or higher      Recumbant Bike   Level 1    RPM 50    Watts 15    Minutes 15    METs 2.15      NuStep   Level 2    SPM 80    Minutes 15    METs 2.15      REL-XR   Level 1    Watts 15    Speed 50    Minutes 15    METs 2.15      Track   Laps 20    Minutes 15    METs 2.09      Prescription Details   Frequency (times per week) 3    Duration Progress to 30 minutes of continuous aerobic without signs/symptoms of physical distress      Intensity   THRR 40-80% of Max Heartrate 102-138    Ratings of Perceived Exertion 11-13    Perceived Dyspnea 0-4      Progression   Progression Continue to progress workloads to maintain intensity without signs/symptoms of physical distress.      Resistance Training   Training  Prescription Yes    Weight 2 lb    Reps 10-15             Perform Capillary Blood Glucose checks as needed.  Exercise Prescription Changes:  Exercise Prescription Changes     Row Name 04/27/22 1400             Response to Exercise   Blood Pressure (Admit) 116/62       Blood Pressure (Exercise) 124/66       Blood Pressure (Exit) 104/64       Heart Rate (Admit) 66 bpm       Heart Rate (Exercise) 96 bpm       Heart Rate (Exit) 69 bpm       Oxygen Saturation (Admit) 98 %       Oxygen Saturation (Exercise) 96 %       Oxygen Saturation (Exit) 99 %       Rating of Perceived Exertion (Exercise) 13       Perceived Dyspnea (Exercise) 2       Symptoms SOB Hip Pain       Comments Results                Exercise Comments:   Exercise Goals and Review:   Exercise Goals     Row Name 04/27/22 1449             Exercise Goals   Increase Physical Activity Yes       Intervention Provide advice, education, support and counseling about physical activity/exercise needs.;Develop an individualized exercise prescription for aerobic and resistive training based on initial evaluation findings, risk stratification, comorbidities and participant's personal goals.       Expected Outcomes Short Term: Attend rehab on a regular basis to increase amount of physical activity.;Long Term: Add in home exercise to make exercise part of routine and to increase amount of physical activity.;Long Term: Exercising regularly at least 3-5 days a week.       Increase Strength and Stamina Yes       Intervention Provide advice, education, support and counseling about physical activity/exercise needs.;Develop an individualized exercise prescription for aerobic and resistive training based on initial evaluation findings, risk stratification, comorbidities and participant's personal goals.       Expected Outcomes Short Term: Perform resistance training exercises routinely during rehab and add in  resistance training at home;Short Term: Increase workloads from initial exercise prescription for resistance, speed, and METs.;Long Term: Improve cardiorespiratory fitness, muscular endurance and strength as measured by increased METs and functional capacity ( )       Able to understand and use rate of perceived exertion (RPE) scale Yes       Intervention Provide education and explanation on how to use RPE scale       Expected Outcomes Short Term: Able to use RPE daily in rehab to express subjective intensity level;Long Term:  Able to use RPE to guide intensity level when exercising independently       Able to understand and use Dyspnea scale Yes       Intervention Provide education and explanation on how to use Dyspnea scale       Expected Outcomes Long Term: Able to use Dyspnea scale to guide intensity level when exercising independently;Short Term: Able to use Dyspnea scale daily in rehab to express subjective sense of shortness of breath during exertion       Knowledge and understanding of Target Heart Rate Range (THRR) Yes       Intervention Provide education and explanation of THRR including how the numbers were predicted and where they are located for reference       Expected Outcomes Short Term: Able to  state/look up THRR;Long Term: Able to use THRR to govern intensity when exercising independently;Short Term: Able to use daily as guideline for intensity in rehab       Able to check pulse independently Yes       Intervention Provide education and demonstration on how to check pulse in carotid and radial arteries.;Review the importance of being able to check your own pulse for safety during independent exercise       Expected Outcomes Short Term: Able to explain why pulse checking is important during independent exercise;Long Term: Able to check pulse independently and accurately       Understanding of Exercise Prescription Yes       Intervention Provide education, explanation, and written  materials on patient's individual exercise prescription       Expected Outcomes Short Term: Able to explain program exercise prescription;Long Term: Able to explain home exercise prescription to exercise independently                Exercise Goals Re-Evaluation :   Discharge Exercise Prescription (Final Exercise Prescription Changes):  Exercise Prescription Changes - 04/27/22 1400       Response to Exercise   Blood Pressure (Admit) 116/62    Blood Pressure (Exercise) 124/66    Blood Pressure (Exit) 104/64    Heart Rate (Admit) 66 bpm    Heart Rate (Exercise) 96 bpm    Heart Rate (Exit) 69 bpm    Oxygen Saturation (Admit) 98 %    Oxygen Saturation (Exercise) 96 %    Oxygen Saturation (Exit) 99 %    Rating of Perceived Exertion (Exercise) 13    Perceived Dyspnea (Exercise) 2    Symptoms SOB Hip Pain    Comments Results             Nutrition:  Target Goals: Understanding of nutrition guidelines, daily intake of sodium 1500mg , cholesterol 200mg , calories 30% from fat and 7% or less from saturated fats, daily to have 5 or more servings of fruits and vegetables.  Education: All About Nutrition: -Group instruction provided by verbal, written material, interactive activities, discussions, models, and posters to present general guidelines for heart healthy nutrition including fat, fiber, MyPlate, the role of sodium in heart healthy nutrition, utilization of the nutrition label, and utilization of this knowledge for meal planning. Follow up email sent as well. Written material given at graduation.   Biometrics:  Pre Biometrics - 04/27/22 1449       Pre Biometrics   Height  (1.626 m)    Weight 166 lb 11.2 oz (75.6 kg)    Waist Circumference 34.5 inches    Hip Circumference 40.5 inches    Waist to Hip Ratio 0.85 %    BMI (Calculated) 28.6    Single Leg Stand 2.4 seconds   Left Leg             Nutrition Therapy Plan and Nutrition Goals:  Nutrition  Therapy & Goals - 04/27/22 1028       Nutrition Therapy   Diet Heart healthy, low Na, pulmonary MNT    Protein (specify units) 90-95g    Fiber 25 grams    Whole Grain Foods 3 servings    Saturated Fats 12 max. grams    Fruits and Vegetables 8 servings/day    Sodium 1.5 grams      Personal Nutrition Goals   Nutrition Goal ST: practice MyPlate guidelines, include 2-3 snacks with fiber, protein, and heart healthy fat  LT:  follow MyPlate guidelines, limit Na <1.5g/day, eat a variety of fruits and vegetables    Comments 66 y.o. F admitted to pulmonary rehab s/p dyspnea. PMHx includes HTN, migraine, CKD stg 3a, Sjogrens syndrome. Relevant medication includes dexilant, folic acid, pilocarpine. Jaiya reports having abdominal surgery to rotate her bowel back to normal position. She is having pain from a new hernia due to surgery, but the MD reports She reports losing 12 lbs in the last 7 weeks due to loss of appetite. She reports having irregular eating habits now  such as snacking or rice pudding, oatmeal, 1 meal per day includes pork, chicken, starchy vegetables and some non-starchy vegetables (she doesn't like most leafy greens or cruciferous vegetables). She reports going out to eat more now as she has not been cooking much at home since the surgery. Discussed sodium concerns with eating out and encouraged to get back into the habit of eating more foods at home. Nani reports having a hard time recalling foods at this time. Drinks: water. Discussed general healthy eating and pulmnary MNT. discussed including snacks during the day that include protein, fiber, and healthy fats to boost her nutrition and to ensure she is meeting her energy and protein needs to avoid losing msucle tissue. Some snacks may inlcude things like fruit and peanut butter or greek yogurt.      Intervention Plan   Intervention Prescribe, educate and counsel regarding individualized specific dietary modifications aiming towards  targeted core components such as weight, hypertension, lipid management, diabetes, heart failure and other comorbidities.;Nutrition handout(s) given to patient.    Expected Outcomes Short Term Goal: A plan has been developed with personal nutrition goals set during dietitian appointment.;Short Term Goal: Understand basic principles of dietary content, such as calories, fat, sodium, cholesterol and nutrients.;Long Term Goal: Adherence to prescribed nutrition plan.             Nutrition Assessments:  MEDIFICTS Score Key: ?70 Need to make dietary changes  40-70 Heart Healthy Diet ? 40 Therapeutic Level Cholesterol Diet  Flowsheet Row Pulmonary Rehab from 04/27/2022 in [facility] Cardiac and Pulmonary Rehab  Picture Your Plate Total Score on Admission 62      Picture Your Plate Scores: <16 Unhealthy dietary pattern with much room for improvement. 41-50 Dietary pattern unlikely to meet recommendations for good health and room for improvement. 51-60 More healthful dietary pattern, with some room for improvement.  >60 Healthy dietary pattern, although there may be some specific behaviors that could be improved.   Nutrition Goals Re-Evaluation:   Nutrition Goals Discharge (Final Nutrition Goals Re-Evaluation):   Psychosocial: Target Goals: Acknowledge presence or absence of significant depression and/or stress, maximize coping skills, provide positive support system. Participant is able to verbalize types and ability to use techniques and skills needed for reducing stress and depression.   Education: Stress, Anxiety, and Depression - Group verbal and visual presentation to define topics covered.  Reviews how body is impacted by stress, anxiety, and depression.  Also discusses healthy ways to reduce stress and to treat/manage anxiety and depression.  Written material given at graduation.   Education: Sleep Hygiene -Provides group verbal and written instruction about how sleep can affect  your health.  Define sleep hygiene, discuss sleep cycles and impact of sleep habits. Review good sleep hygiene tips.    Initial Review & Psychosocial Screening:  Initial Psych Review & Screening - 04/26/22 1114       Initial Review   Current issues with Current Sleep Concerns;Current Stress  Concerns    Source of Stress Concerns Chronic Illness      Family Dynamics   Good Support System? Yes    Comments Janyla states that she does not sleep very well. She wakes up every couple hours or so. She can look to her family for support and has great support from the rest of her family. Lorisa may have to go back for more abnominal surgery due to a hernia which creates some stress.      Barriers   Psychosocial barriers to participate in program The patient should benefit from training in stress management and relaxation.;There are no identifiable barriers or psychosocial needs.      Screening Interventions   Interventions Encouraged to exercise;To provide support and resources with identified psychosocial needs;Provide feedback about the scores to participant    Expected Outcomes Short Term goal: Utilizing psychosocial counselor, staff and physician to assist with identification of specific Stressors or current issues interfering with healing process. Setting desired goal for each stressor or current issue identified.;Long Term Goal: Stressors or current issues are controlled or eliminated.;Short Term goal: Identification and review with participant of any Quality of Life or Depression concerns found by scoring the questionnaire.;Long Term goal: The participant improves quality of Life and PHQ9 Scores as seen by post scores and/or verbalization of changes             Quality of Life Scores:  Scores of 19 and below usually indicate a poorer quality of life in these areas.  A difference of  2-3 points is a clinically meaningful difference.  A difference of 2-3 points in the total score of the Quality  of Life Index has been associated with significant improvement in overall quality of life, self-image, physical symptoms, and general health in studies assessing change in quality of life.  PHQ-9: Review Flowsheet       04/27/2022  Depression screen PHQ 2/9  Decreased Interest 1  Down, Depressed, Hopeless 1  PHQ - 2 Score 2  Altered sleeping 3  Tired, decreased energy 3  Change in appetite 3  Feeling bad or failure about yourself  1  Trouble concentrating 0  Moving slowly or fidgety/restless 0  Suicidal thoughts 0  PHQ-9 Score 12  Difficult doing work/chores Very difficult   Interpretation of Total Score  Total Score Depression Severity:  1-4 = Minimal depression, 5-9 = Mild depression, 10-14 = Moderate depression, 15-19 = Moderately severe depression, 20-27 = Severe depression   Psychosocial Evaluation and Intervention:  Psychosocial Evaluation - 04/26/22 1116       Psychosocial Evaluation & Interventions   Interventions Encouraged to exercise with the program and follow exercise prescription;Relaxation education;Stress management education    Comments Leitha states that she does not sleep very well. She wakes up every couple hours or so. She can look to her family for support and has great support from the rest of her family.    Expected Outcomes Short: Start [facility] to help with mood. Long: Maintain a healthy mental state.    Continue Psychosocial Services  Follow up required by staff             Psychosocial Re-Evaluation:   Psychosocial Discharge (Final Psychosocial Re-Evaluation):   Education: Education Goals: Education classes will be provided on a weekly basis, covering required topics. Participant will state understanding/return demonstration of topics presented.  Learning Barriers/Preferences:  Learning Barriers/Preferences - 04/26/22 1113       Learning Barriers/Preferences   Learning Barriers None  Learning Preferences None              General Pulmonary Education Topics:  Infection Prevention: - Provides verbal and written material to individual with discussion of infection control including proper hand washing and proper equipment cleaning during exercise session. Flowsheet Row Pulmonary Rehab from 04/27/2022 in [facility] Cardiac and Pulmonary Rehab  Date 04/26/22  Educator [facility]  Instruction Review Code 1- Verbalizes Understanding       Falls Prevention: - Provides verbal and written material to individual with discussion of falls prevention and safety. Flowsheet Row Pulmonary Rehab from 04/27/2022 in [facility] Cardiac and Pulmonary Rehab  Date 04/26/22  Educator [facility]  Instruction Review Code 1- Verbalizes Understanding       Chronic Lung Disease Review: - Group verbal instruction with posters, models, PowerPoint presentations and videos,  to review new updates, new respiratory medications, new advancements in procedures and treatments. Providing information on websites and "800" numbers for continued self-education. Includes information about supplement oxygen, available portable oxygen systems, continuous and intermittent flow rates, oxygen safety, concentrators, and Medicare reimbursement for oxygen. Explanation of Pulmonary Drugs, including class, frequency, complications, importance of spacers, rinsing mouth after steroid MDI's, and proper cleaning methods for nebulizers. Review of basic lung anatomy and physiology related to function, structure, and complications of lung disease. Review of risk factors. Discussion about methods for diagnosing sleep apnea and types of masks and machines for OSA. Includes a review of the use of types of environmental controls: home humidity, furnaces, filters, dust mite/pet prevention, HEPA vacuums. Discussion about weather changes, air quality and the benefits of nasal washing. Instruction on Warning signs, infection symptoms, calling MD promptly, preventive modes, and value of vaccinations.  Review of effective airway clearance, coughing and/or vibration techniques. Emphasizing that all should Create an Action Plan. Written material given at graduation. Flowsheet Row Pulmonary Rehab from 04/27/2022 in [facility] Cardiac and Pulmonary Rehab  Education need identified 04/27/22       AED/CPR: - Group verbal and written instruction with the use of models to demonstrate the basic use of the AED with the basic ABC's of resuscitation.    Anatomy and Cardiac Procedures: - Group verbal and visual presentation and models provide information about basic cardiac anatomy and function. Reviews the testing methods done to diagnose heart disease and the outcomes of the test results. Describes the treatment choices: Medical Management, Angioplasty, or Coronary Bypass Surgery for treating various heart conditions including Myocardial Infarction, Angina, Valve Disease, and Cardiac Arrhythmias.  Written material given at graduation.   Medication Safety: - Group verbal and visual instruction to review commonly prescribed medications for heart and lung disease. Reviews the medication, class of the drug, and side effects. Includes the steps to properly store meds and maintain the prescription regimen.  Written material given at graduation.   Other: -Provides group and verbal instruction on various topics (see comments)   Knowledge Questionnaire Score:  Knowledge Questionnaire Score - 04/27/22 1422       Knowledge Questionnaire Score   Pre Score 16/18              Core Components/Risk Factors/Patient Goals at Admission:  Personal Goals and Risk Factors at Admission - 04/27/22 1424       Core Components/Risk Factors/Patient Goals on Admission    Weight Management Yes;Weight Loss    Intervention Weight Management: Develop a combined nutrition and exercise program designed to reach desired caloric intake, while maintaining appropriate intake of nutrient and fiber, sodium and fats, and  appropriate energy expenditure required for the weight goal.;Weight Management: Provide education and appropriate resources to help participant work on and attain dietary goals.;Weight Management/Obesity: Establish reasonable short term and long term weight goals.    Admit Weight 166 lb 11.2 oz (75.6 kg)    Goal Weight: Short Term 160 lb (72.6 kg)    Goal Weight: Long Term 155 lb (70.3 kg)    Expected Outcomes Short Term: Continue to assess and modify interventions until short term weight is achieved;Long Term: Adherence to nutrition and physical activity/exercise program aimed toward attainment of established weight goal;Weight Loss: Understanding of general recommendations for a balanced deficit meal plan, which promotes 1-2 lb weight loss per week and includes a negative energy balance of [phone] kcal/d;Understanding recommendations for meals to include 15-35% energy as protein, 25-35% energy from fat, 35-60% energy from carbohydrates, less than  of dietary cholesterol, 20-35 gm of total fiber daily;Understanding of distribution of calorie intake throughout the day with the consumption of 4-5 meals/snacks    Improve shortness of breath with ADL's Yes    Intervention Provide education, individualized exercise plan and daily activity instruction to help decrease symptoms of SOB with activities of daily living.    Expected Outcomes Short Term: Improve cardiorespiratory fitness to achieve a reduction of symptoms when performing ADLs;Long Term: Be able to perform more ADLs without symptoms or delay the onset of symptoms    Hypertension Yes    Intervention Provide education on lifestyle modifcations including regular physical activity/exercise, weight management, moderate sodium restriction and increased consumption of fresh fruit, vegetables, and low fat dairy, alcohol moderation, and smoking cessation.;Monitor prescription use compliance.    Expected Outcomes Short Term: Continued assessment and  intervention until BP is < 140/2mm HG in hypertensive participants. < 130/72mm HG in hypertensive participants with diabetes, heart failure or chronic kidney disease.;Long Term: Maintenance of blood pressure at goal levels.             Education:Diabetes - Individual verbal and written instruction to review signs/symptoms of diabetes, desired ranges of glucose level fasting, after meals and with exercise. Acknowledge that pre and post exercise glucose checks will be done for 3 sessions at entry of program.   Know Your Numbers and Heart Failure: - Group verbal and visual instruction to discuss disease risk factors for cardiac and pulmonary disease and treatment options.  Reviews associated critical values for Overweight/Obesity, Hypertension, Cholesterol, and Diabetes.  Discusses basics of heart failure: signs/symptoms and treatments.  Introduces Heart Failure Zone chart for action plan for heart failure.  Written material given at graduation.   Core Components/Risk Factors/Patient Goals Review:    Core Components/Risk Factors/Patient Goals at Discharge (Final Review):    ITP Comments:  ITP Comments     Row Name 04/26/22 1111 04/27/22 1421         ITP Comments Virtual Visit completed. Patient informed on EP and RD appointment and 6 Minute walk test. Patient also informed of patient health questionnaires on My Chart. Patient Verbalizes understanding. Visit diagnosis can be found in [facility] 04/12/2022. Completed and gym orientation. Initial ITP created and sent for review to Dr. Vida Rigger, Medical Director.               Comments: Initial ITP

## 2022-04-29 ENCOUNTER — Ambulatory Visit: Admission: RE | Admit: 2022-04-29 | Payer: Medicare PPO | Source: Ambulatory Visit

## 2022-05-03 ENCOUNTER — Encounter: Payer: Medicare PPO | Admitting: *Deleted

## 2022-05-03 DIAGNOSIS — R0600 Dyspnea, unspecified: Secondary | ICD-10-CM

## 2022-05-03 NOTE — Progress Notes (Signed)
Daily Session Note  Patient Details  Name: Amy Huynh MRN: [id number] Date of Birth: [birth year] Referring Provider:   Flowsheet Row Pulmonary Rehab from 04/27/2022 in [facility] Cardiac and Pulmonary Rehab  Referring Provider Dr Ottie Glazier       Encounter Date: 05/03/2022  Check In:  Session Check In - 05/03/22 1059       Check-In   Supervising physician immediately available to respond to emergencies See telemetry face sheet for immediately available ER MD    Location [facility]-Cardiac & Pulmonary Rehab    Staff Present Darlyne Russian, RN, ADN;Meredith Sherryll Burger, RN BSN;Noah Tickle, BS, Exercise Physiologist    Virtual Visit No    Medication changes reported     No    Fall or balance concerns reported    No    Warm-up and Cool-down Performed on first and last piece of equipment    Resistance Training Performed Yes    VAD Patient? No    PAD/SET Patient? No      Pain Assessment   Currently in Pain? No/denies                Social History   Tobacco Use  Smoking Status Never  Smokeless Tobacco Never    Goals Met:  Independence with exercise equipment Exercise tolerated well No report of concerns or symptoms today Strength training completed today  Goals Unmet:  Not Applicable  Comments: First full day of exercise!  Patient was oriented to gym and equipment including functions, settings, policies, and procedures.  Patient's individual exercise prescription and treatment plan were reviewed.  All starting workloads were established based on the results of the 6 minute walk test done at initial orientation visit.  The plan for exercise progression was also introduced and progression will be customized based on patient's performance and goals.    Dr. Emily Filbert is Medical Director for [place].  Dr. Ottie Glazier is Medical Director for [facility] Pulmonary Rehabilitation.

## 2022-05-04 ENCOUNTER — Ambulatory Visit
Admission: RE | Admit: 2022-05-04 | Discharge: 2022-05-04 | Disposition: A | Discharge status: Home / Self Care | Payer: Medicare PPO | Source: Ambulatory Visit | Attending: Pulmonary Disease | Admitting: Pulmonary Disease

## 2022-05-04 DIAGNOSIS — R7989 Other specified abnormal findings of blood chemistry: Secondary | ICD-10-CM | POA: Insufficient documentation

## 2022-05-04 MED ORDER — IOHEXOL 350 MG/ML SOLN
75.0000 mL | Freq: Once | INTRAVENOUS | Status: AC | PRN
  Administered 2022-05-04: 75 mL via INTRAVENOUS

## 2022-05-05 ENCOUNTER — Telehealth: Payer: Self-pay

## 2022-05-05 ENCOUNTER — Other Ambulatory Visit: Payer: Self-pay

## 2022-05-05 ENCOUNTER — Emergency Department
Admission: EM | Admit: 2022-05-05 | Discharge: 2022-05-06 | Disposition: A | Discharge status: Home / Self Care | Payer: Medicare PPO | Attending: Student in an Organized Health Care Education/Training Program | Admitting: Student in an Organized Health Care Education/Training Program

## 2022-05-05 DIAGNOSIS — R1013 Epigastric pain: Secondary | ICD-10-CM | POA: Diagnosis present

## 2022-05-05 DIAGNOSIS — R110 Nausea: Secondary | ICD-10-CM | POA: Insufficient documentation

## 2022-05-05 LAB — COMPREHENSIVE METABOLIC PANEL
ALT: 13 U/L (ref 0–44)
AST: 21 U/L (ref 15–41)
Albumin: 3.7 g/dL (ref 3.5–5.0)
Alkaline Phosphatase: 61 U/L (ref 38–126)
Anion gap: 10 (ref 5–15)
BUN: 9 mg/dL (ref 8–23)
CO2: 18 mmol/L — ABNORMAL LOW (ref 22–32)
Calcium: 9 mg/dL (ref 8.9–10.3)
Chloride: 114 mmol/L — ABNORMAL HIGH (ref 98–111)
Creatinine, Ser: 1.04 mg/dL — ABNORMAL HIGH (ref 0.44–1.00)
GFR, Estimated: >60 mL/min (ref >60)
Glucose, Bld: 102 mg/dL — ABNORMAL HIGH (ref 70–99)
Potassium: 3.4 mmol/L — ABNORMAL LOW (ref 3.5–5.1)
Sodium: 142 mmol/L (ref 135–145)
Total Bilirubin: 0.7 mg/dL (ref 0.3–1.2)
Total Protein: 6.7 g/dL (ref 6.5–8.1)

## 2022-05-05 LAB — URINALYSIS, ROUTINE W REFLEX MICROSCOPIC
Bilirubin Urine: NEGATIVE
Glucose, UA: NEGATIVE mg/dL
Ketones, ur: NEGATIVE mg/dL
Leukocytes,Ua: NEGATIVE
Nitrite: NEGATIVE
Protein, ur: NEGATIVE mg/dL
Specific Gravity, Urine: 1.013 (ref 1.005–1.030)
pH: 5 (ref 5.0–8.0)

## 2022-05-05 LAB — CBC
HCT: 40 % (ref 36.0–46.0)
Hemoglobin: 11.5 g/dL — ABNORMAL LOW (ref 12.0–15.0)
MCH: 26.1 pg (ref 26.0–34.0)
MCHC: 28.8 g/dL — ABNORMAL LOW (ref 30.0–36.0)
MCV: 90.9 fL (ref 80.0–100.0)
Platelets: 263 10*3/uL (ref 150–400)
RBC: 4.4 MIL/uL (ref 3.87–5.11)
RDW: 17.9 % — ABNORMAL HIGH (ref 11.5–15.5)
WBC: 4.6 10*3/uL (ref 4.0–10.5)
nRBC: 0 % (ref 0.0–0.2)

## 2022-05-05 LAB — LIPASE, BLOOD: Lipase: 45 U/L (ref 11–51)

## 2022-05-05 MED ORDER — SODIUM CHLORIDE 0.9 % IV BOLUS
500.0000 mL | Freq: Once | INTRAVENOUS | Status: AC
  Administered 2022-05-06: 500 mL via INTRAVENOUS

## 2022-05-05 MED ORDER — ONDANSETRON HCL 4 MG/2ML IJ SOLN
4.0000 mg | Freq: Once | INTRAMUSCULAR | Status: AC
  Administered 2022-05-06: 4 mg via INTRAVENOUS
  Filled 2022-05-05: qty 2

## 2022-05-05 MED ORDER — FENTANYL CITRATE PF 50 MCG/ML IJ SOSY
50.0000 ug | PREFILLED_SYRINGE | INTRAMUSCULAR | Status: DC | PRN
  Administered 2022-05-06 (×2): 50 ug via INTRAVENOUS
  Filled 2022-05-05 (×2): qty 1

## 2022-05-05 NOTE — Telephone Encounter (Signed)
If the patient is having black stools indicative of a GI bleed and she needs to go to the emergency room.

## 2022-05-05 NOTE — ED Triage Notes (Signed)
Pt to ED via POV for abdominal pain. Pt states that she had abdominal surgery about 8 weeks ago. Pt states that she had a CT scan 1 week ago and was told she has a hernia. Pt reports she has trouble passing her bowels. Pt reports that she has had 3-4 episodes of dark stools. Pt is in NAD.

## 2022-05-05 NOTE — Telephone Encounter (Signed)
Pt is aware as instructed and states she will go to ER when her husband gets off work this evening  Pt still has 05/18/2022 appt can be used for hosp f/u

## 2022-05-05 NOTE — Telephone Encounter (Signed)
Pt reports she has had dark stools x 3 days, LUQ pain is now RUQ and periumbilical also with nausea and temp of 99 x 2 days...   Pt had a CT Abd pelvis on 04/24/22   Please advise

## 2022-05-05 NOTE — ED Provider Notes (Signed)
[facility] Provider Note    Event Date/Time   First MD Initiated Contact with Patient 05/05/22 2213     (approximate)   History   Abdominal Pain   HPI  Amy Huynh is a 66 y.o. female with extensive past medical history recent laparotomy presents to the ER for evaluation of worsening epigastric pain nausea.  States that she has had some darker colored stools no hematemesis not on any blood thinners.  Does have a history of heartburn.     Physical Exam   Triage Vital Signs: ED Triage Vitals [05/05/22 1657]  Enc Vitals Group     BP 132/87     Pulse Rate 73     Resp 16     Temp 98.2 F (36.8 C)     Temp Source Oral     SpO2 100 %     Weight 165 lb (74.8 kg)     Height 5\' 2"  (1.575 m)     Head Circumference      Peak Flow      Pain Score 3     Pain Loc      Pain Edu?      Excl. in GC?     Most recent vital signs: Vitals:   05/05/22 1657  BP: 132/87  Pulse: 73  Resp: 16  Temp: 98.2 F (36.8 C)  SpO2: 100%     Constitutional: Alert  Eyes: Conjunctivae are normal.  Head: Atraumatic. Nose: No congestion/rhinnorhea. Mouth/Throat: Mucous membranes are moist.   Neck: Painless ROM.  Cardiovascular:   Good peripheral circulation. Respiratory: Normal respiratory effort.  No retractions.  Gastrointestinal: Soft, mild epigastric tenderness to palpation.  No overlying erythema.  No significant hernia appreciated previous surgical scar.  No guarding or rebound tenderness. Musculoskeletal:  no deformity Neurologic:  MAE spontaneously. No gross focal neurologic deficits are appreciated.  Skin:  Skin is warm, dry and intact. No rash noted. Psychiatric: Mood and affect are normal. Speech and behavior are normal.    ED Results / Procedures / Treatments   Labs (all labs ordered are listed, but only abnormal results are displayed) Labs Reviewed  COMPREHENSIVE METABOLIC PANEL - Abnormal; Notable for the following components:       Result Value   Potassium 3.4 (*)    Chloride 114 (*)    CO2 18 (*)    Glucose, Bld 102 (*)    Creatinine, Ser 1.04 (*)    All other components within normal limits  CBC - Abnormal; Notable for the following components:   Hemoglobin 11.5 (*)    MCHC 28.8 (*)    RDW 17.9 (*)    All other components within normal limits  URINALYSIS, ROUTINE W REFLEX MICROSCOPIC - Abnormal; Notable for the following components:   Color, Urine YELLOW (*)    APPearance CLEAR (*)    Hgb urine dipstick SMALL (*)    Bacteria, UA RARE (*)    All other components within normal limits  LIPASE, BLOOD     EKG     RADIOLOGY Please see ED Course for my review and interpretation.  I personally reviewed all radiographic images ordered to evaluate for the above acute complaints and reviewed radiology reports and findings.  These findings were personally discussed with the patient.  Please see medical record for radiology report.    PROCEDURES:  Critical Care performed: No  Procedures   MEDICATIONS ORDERED IN ED: Medications  ondansetron (ZOFRAN) injection 4 mg (has no  administration in time range)  fentaNYL (SUBLIMAZE) injection 50 mcg (has no administration in time range)  sodium chloride 0.9 % bolus 500 mL (has no administration in time range)     IMPRESSION / MDM / ASSESSMENT AND PLAN / ED COURSE  I reviewed the triage vital signs and the nursing notes.                              Differential diagnosis includes, but is not limited to, status, PUD, enteritis, SBO, hernia, perforation, pancreatitis, electrolyte abnormality, IBD  Patient presenting to the ER for evaluation of symptoms as described above.  Based on symptoms, risk factors and considered above differential, this presenting complaint could reflect a potentially life-threatening illness therefore the patient will be placed on continuous pulse oximetry and telemetry for monitoring.  Laboratory evaluation will be sent to evaluate for  the above complaints.  CT imaging will be ordered given her acute abdominal pain in postoperative setting.  Her blood work is otherwise reassuring with mild hypokalemia.  Patient will be given IV fentanyl for pain.  Patient be signed out to oncoming physician pending follow-up imaging.        FINAL CLINICAL IMPRESSION(S) / ED DIAGNOSES   Final diagnoses:  Epigastric pain     Rx / DC Orders   ED Discharge Orders     None        Note:  This document was prepared using Dragon voice recognition software and may include unintentional dictation errors.    Willy Eddy, MD 05/05/22 2325

## 2022-05-06 ENCOUNTER — Telehealth: Payer: Self-pay

## 2022-05-06 ENCOUNTER — Emergency Department: Payer: Medicare PPO

## 2022-05-06 MED ORDER — IOHEXOL 300 MG/ML  SOLN
100.0000 mL | Freq: Once | INTRAMUSCULAR | Status: AC | PRN
  Administered 2022-05-06: 100 mL via INTRAVENOUS

## 2022-05-06 MED ORDER — POTASSIUM CHLORIDE CRYS ER 20 MEQ PO TBCR: 20.0000 meq | EXTENDED_RELEASE_TABLET | Freq: Every day | ORAL | 0 refills | Status: DC

## 2022-05-06 MED ORDER — ONDANSETRON 4 MG PO TBDP: 4.0000 mg | ORAL_TABLET | Freq: Three times a day (TID) | ORAL | 0 refills | Status: AC | PRN

## 2022-05-06 NOTE — Telephone Encounter (Signed)
Pt lm requesting I call her back... She states she is so disappointed with the care that she received at [facility] ED and she never spoke with the physician directly.... She does not know what the CT or labs mean and if it is okay for her to wait until 05/18/22 for OV with you...   Please advise

## 2022-05-06 NOTE — Telephone Encounter (Signed)
Pt is aware of comments and expressed understanding....   Pt states she is still having abdominal pain and would like to know if there is anything she can take to help with the discomfort

## 2022-05-06 NOTE — Telephone Encounter (Signed)
Pt reports the pain is around the belly button with radiation to epigastric region, does not report any aggravating factors... describes the pain as burning pain with intermittent gas and some nausea

## 2022-05-06 NOTE — Discharge Instructions (Signed)
Your lab tests and CT scan were all okay.  Take Zofran as needed for nausea, and continue taking your other medications as prescribed.

## 2022-05-12 ENCOUNTER — Encounter: Payer: Medicare PPO | Admitting: *Deleted

## 2022-05-12 ENCOUNTER — Encounter: Payer: Self-pay | Admitting: *Deleted

## 2022-05-12 DIAGNOSIS — R0600 Dyspnea, unspecified: Secondary | ICD-10-CM

## 2022-05-12 NOTE — Progress Notes (Signed)
Pulmonary Individual Treatment Plan  Patient Details  Name: Temple Ewart MRN: [id number] Date of Birth: 05-07-1956 Referring Provider:   Flowsheet Row Pulmonary Rehab from 04/27/2022 in [facility] Cardiac and Pulmonary Rehab  Referring Provider Dr Vida Rigger       Initial Encounter Date:  Flowsheet Row Pulmonary Rehab from 04/27/2022 in [facility] Cardiac and Pulmonary Rehab  Date 04/27/22       Visit Diagnosis: Dyspnea, unspecified type  Patient's Home Medications on Admission:  Current Outpatient Medications:    Apremilast (OTEZLA) 30 MG TABS, Take 30 mg by mouth 2 (two) times daily., Disp: , Rfl:    butalbital-acetaminophen-caffeine (FIORICET, ESGIC) 50-325-40 MG tablet, Take 1 tablet by mouth every 6 (six) hours as needed for headache., Disp: , Rfl:    cetirizine (ZYRTEC) 10 MG tablet, Take 10 mg by mouth daily., Disp: , Rfl:    clobetasol cream (TEMOVATE) 0.05 %, Apply topically., Disp: , Rfl:    cyanocobalamin 1000 MCG tablet, Take 1,000 mcg by mouth daily., Disp: , Rfl:    dexlansoprazole (DEXILANT) 60 MG capsule, TAKE 1 CAPSULE(60 MG) BY MOUTH DAILY, Disp: 90 capsule, Rfl: 0   Eptinezumab-jjmr (VYEPTI) 100 MG/ML injection, Inject 100 mg into the vein every 3 (three) months. Last Saturday, Disp: , Rfl:    fexofenadine (ALLEGRA) 180 MG tablet, Take 180 mg by mouth daily., Disp: , Rfl:    folic acid (FOLVITE) 1 MG tablet, Take 1 mg by mouth daily., Disp: , Rfl:    gabapentin (NEURONTIN) 300 MG capsule, Take 900 mg by mouth at bedtime., Disp: , Rfl:    hydrOXYzine (ATARAX/VISTARIL) 10 MG tablet, Take 10 mg by mouth every 8 (eight) hours as needed., Disp: , Rfl:    hyoscyamine (LEVSIN, ANASPAZ) 0.125 MG tablet, Take 0.125 mg by mouth every 4 (four) hours as needed. (Patient not taking: Reported on 04/26/2022), Disp: , Rfl:    methocarbamol (ROBAXIN) 500 MG tablet, Take 500 mg by mouth at bedtime. , Disp: , Rfl:    methotrexate (RHEUMATREX) 2.5 MG tablet, Take 20 mg by mouth  once a week. Take 4 tablets (10 mg ) by mouth in the morning and 4 tablets(10 mg by mouth in the evening on Saturdays (total= 20 mg), Disp: , Rfl:    ondansetron (ZOFRAN-ODT) 4 MG disintegrating tablet, Take 1 tablet (4 mg total) by mouth every 8 (eight) hours as needed for nausea or vomiting., Disp: 20 tablet, Rfl: 0   oxyCODONE-acetaminophen (PERCOCET) 5-325 MG tablet, Take 1 tablet by mouth every 8 (eight) hours as needed for severe pain. (Patient not taking: Reported on 04/26/2022), Disp: 15 tablet, Rfl: 0   pantoprazole (PROTONIX) 40 MG tablet, Take 1 tablet (40 mg total) by mouth 2 (two) times daily. (Patient not taking: Reported on 04/26/2022), Disp: 180 tablet, Rfl: 1   pilocarpine (SALAGEN) 5 MG tablet, Take 5 mg by mouth 3 (three) times daily., Disp: , Rfl:    potassium chloride SA (KLOR-CON M) 20 MEQ tablet, Take 1 tablet (20 mEq total) by mouth daily for 7 days., Disp: 7 tablet, Rfl: 0   promethazine (PHENERGAN) 25 MG tablet, Take 25 mg by mouth every 6 (six) hours as needed for nausea or vomiting., Disp: , Rfl:    rizatriptan (MAXALT) 10 MG tablet, Take 10 mg by mouth as needed for migraine. May repeat in 2 hours if needed, Disp: , Rfl:    rizatriptan (MAXALT-MLT) 10 MG disintegrating tablet, Take by mouth., Disp: , Rfl:    rOPINIRole (REQUIP)  0.5 MG tablet, Take 1 tablet by mouth at bedtime. , Disp: , Rfl:    rOPINIRole (REQUIP) 2 MG tablet, Take 2 mg by mouth at bedtime., Disp: , Rfl:    topiramate (TOPAMAX) 100 MG tablet, Take 200 mg by mouth daily., Disp: , Rfl:    topiramate (TOPAMAX) 100 MG tablet, Take 2 tablets by mouth daily. (Patient not taking: Reported on 04/26/2022), Disp: , Rfl:    valACYclovir (VALTREX) 1000 MG tablet, Take 1 tablet once a day by mouth (Patient not taking: Reported on 04/26/2022), Disp: , Rfl:   Past Medical History: Past Medical History:  Diagnosis Date   Arthritis    Family history of ovarian cancer 12/2016   MyRisk neg   Fibromyalgia    Migraine     Neuropathy    Placenta previa    Sjogren's disease ([place])     Tobacco Use: Social History   Tobacco Use  Smoking Status Never  Smokeless Tobacco Never    Labs: Review Flowsheet        No data to display           Pulmonary Assessment Scores:  Pulmonary Assessment Scores     Row Name 04/27/22 1425         ADL UCSD   ADL Phase Entry     SOB Score total 57     Rest 0     Walk 3     Stairs 5     Bath 3     Dress 2     Shop 4       CAT Score   CAT Score 18       mMRC Score   mMRC Score 1              UCSD: Self-administered rating of dyspnea associated with activities of daily living (ADLs) 6-point scale (0 = "not at all" to 5 = "maximal or unable to do because of breathlessness")  Scoring Scores range from 0 to 120.  Minimally important difference is 5 units  CAT: CAT can identify the health impairment of COPD patients and is better correlated with disease progression.  CAT has a scoring range of zero to 40. The CAT score is classified into four groups of low (less than 10), medium (10 - 20), high (21-30) and very high (31-40) based on the impact level of disease on health status. A CAT score over 10 suggests significant symptoms.  A worsening CAT score could be explained by an exacerbation, poor medication adherence, poor inhaler technique, or progression of COPD or comorbid conditions.  CAT MCID is 2 points  mMRC: mMRC (Modified Medical Research Council) Dyspnea Scale is used to assess the degree of baseline functional disability in patients of respiratory disease due to dyspnea. No minimal important difference is established. A decrease in score of 1 point or greater is considered a positive change.   Pulmonary Function Assessment:  Pulmonary Function Assessment - 04/26/22 1112       Breath   Shortness of Breath Yes;Limiting activity             Exercise Target Goals: Exercise Program Goal: Individual exercise prescription set using  results from initial 6 min walk test and THRR while considering  patient's activity barriers and safety.   Exercise Prescription Goal: Initial exercise prescription builds to 30-45 minutes a day of aerobic activity, 2-3 days per week.  Home exercise guidelines will be given to patient during program as part of  exercise prescription that the participant will acknowledge.  Education: Aerobic Exercise: - Group verbal and visual presentation on the components of exercise prescription. Introduces F.I.T.T principle from ACSM for exercise prescriptions.  Reviews F.I.T.T. principles of aerobic exercise including progression. Written material given at graduation.   Education: Resistance Exercise: - Group verbal and visual presentation on the components of exercise prescription. Introduces F.I.T.T principle from ACSM for exercise prescriptions  Reviews F.I.T.T. principles of resistance exercise including progression. Written material given at graduation.    Education: Exercise & Equipment Safety: - Individual verbal instruction and demonstration of equipment use and safety with use of the equipment. Flowsheet Row Pulmonary Rehab from 04/27/2022 in [facility] Cardiac and Pulmonary Rehab  Date 04/26/22  Educator [facility]  Instruction Review Code 1- Verbalizes Understanding       Education: Exercise Physiology & General Exercise Guidelines: - Group verbal and written instruction with models to review the exercise physiology of the cardiovascular system and associated critical values. Provides general exercise guidelines with specific guidelines to those with heart or lung disease.    Education: Flexibility, Balance, Mind/Body Relaxation: - Group verbal and visual presentation with interactive activity on the components of exercise prescription. Introduces F.I.T.T principle from ACSM for exercise prescriptions. Reviews F.I.T.T. principles of flexibility and balance exercise training including progression. Also  discusses the mind body connection.  Reviews various relaxation techniques to help reduce and manage stress (i.e. Deep breathing, progressive muscle relaxation, and visualization). Balance handout provided to take home. Written material given at graduation.   Activity Barriers & Risk Stratification:  Activity Barriers & Cardiac Risk Stratification - 04/27/22 1438       Activity Barriers & Cardiac Risk Stratification   Activity Barriers Shortness of Breath;Left Knee Replacement;Other (comment);Back Problems    Comments Neuropathy in feet, Hip Pain             6 Minute Walk:  6 Minute Walk     Row Name 04/27/22 1433         6 Minute Walk   Phase Initial     Distance 800 feet     Walk Time 6 minutes     # of Rest Breaks 0     MPH 1.52     METS 2.15     RPE 13     Perceived Dyspnea  2     VO2 Peak 7.52     Symptoms Yes (comment)     Comments SOB, Hip Pain 4/10     Resting HR 66 bpm     Resting BP 116/62     Resting Oxygen Saturation  98 %     Exercise Oxygen Saturation  during 6 min walk 96 %     Max Ex. HR 96 bpm     Max Ex. BP 124/66     2 Minute Post BP 104/64       Interval HR   1 Minute HR 96     2 Minute HR 96     3 Minute HR 96     4 Minute HR 94     5 Minute HR 94     6 Minute HR 90     2 Minute Post HR 69     Interval Heart Rate? Yes       Interval Oxygen   Interval Oxygen? Yes     Baseline Oxygen Saturation % 98 %     1 Minute Oxygen Saturation % 98 %     1 Minute  Liters of Oxygen 0 L  RA     2 Minute Oxygen Saturation % 97 %     2 Minute Liters of Oxygen 0 L  RA     3 Minute Oxygen Saturation % 96 %     3 Minute Liters of Oxygen 0 L  RA     4 Minute Oxygen Saturation % 98 %     4 Minute Liters of Oxygen 0 L  RA     5 Minute Oxygen Saturation % 99 %     5 Minute Liters of Oxygen 0 L  RA     6 Minute Oxygen Saturation % 98 %     6 Minute Liters of Oxygen 0 L  RA     2 Minute Post Oxygen Saturation % 99 %     2 Minute Post Liters of Oxygen 0 L   RA             Oxygen Initial Assessment:  Oxygen Initial Assessment - 04/26/22 1112       Home Oxygen   Home Oxygen Device None    Sleep Oxygen Prescription None    Home Exercise Oxygen Prescription None    Home Resting Oxygen Prescription None      Initial 6 min Walk   Oxygen Used None      Program Oxygen Prescription   Program Oxygen Prescription None      Intervention   Short Term Goals To learn and demonstrate proper pursed lip breathing techniques or other breathing techniques. ;To learn and understand importance of monitoring SPO2 with pulse oximeter and demonstrate accurate use of the pulse oximeter.;To learn and understand importance of maintaining oxygen saturations>88%;To learn and exhibit compliance with exercise, home and travel O2 prescription    Long  Term Goals Exhibits compliance with exercise, home  and travel O2 prescription;Maintenance of O2 saturations>88%;Demonstrates proper use of MDI's;Exhibits proper breathing techniques, such as pursed lip breathing or other method taught during program session;Verbalizes importance of monitoring SPO2 with pulse oximeter and return demonstration             Oxygen Re-Evaluation:  Oxygen Re-Evaluation     Row Name 05/03/22 1102             Goals/Expected Outcomes   Comments Reviewed PLB technique with pt.  Talked about how it works and it's importance in maintaining their exercise saturations.       Goals/Expected Outcomes Short: Become more profiecient at using PLB. Long: Become independent at using PLB.                Oxygen Discharge (Final Oxygen Re-Evaluation):  Oxygen Re-Evaluation - 05/03/22 1102       Goals/Expected Outcomes   Comments Reviewed PLB technique with pt.  Talked about how it works and it's importance in maintaining their exercise saturations.    Goals/Expected Outcomes Short: Become more profiecient at using PLB. Long: Become independent at using PLB.             Initial  Exercise Prescription:  Initial Exercise Prescription - 04/27/22 1400       Date of Initial Exercise RX and Referring Provider   Date 04/27/22    Referring Provider Dr Ottie Glazier      Oxygen   Maintain Oxygen Saturation 88% or higher      Recumbant Bike   Level 1    RPM 50    Watts 15    Minutes 15  METs 2.15      NuStep   Level 2    SPM 80    Minutes 15    METs 2.15      REL-XR   Level 1    Watts 15    Speed 50    Minutes 15    METs 2.15      Track   Laps 20    Minutes 15    METs 2.09      Prescription Details   Frequency (times per week) 3    Duration Progress to 30 minutes of continuous aerobic without signs/symptoms of physical distress      Intensity   THRR 40-80% of Max Heartrate 102-138    Ratings of Perceived Exertion 11-13    Perceived Dyspnea 0-4      Progression   Progression Continue to progress workloads to maintain intensity without signs/symptoms of physical distress.      Resistance Training   Training Prescription Yes    Weight 2 lb    Reps 10-15             Perform Capillary Blood Glucose checks as needed.  Exercise Prescription Changes:   Exercise Prescription Changes     Row Name 04/27/22 1400             Response to Exercise   Blood Pressure (Admit) 116/62       Blood Pressure (Exercise) 124/66       Blood Pressure (Exit) 104/64       Heart Rate (Admit) 66 bpm       Heart Rate (Exercise) 96 bpm       Heart Rate (Exit) 69 bpm       Oxygen Saturation (Admit) 98 %       Oxygen Saturation (Exercise) 96 %       Oxygen Saturation (Exit) 99 %       Rating of Perceived Exertion (Exercise) 13       Perceived Dyspnea (Exercise) 2       Symptoms SOB Hip Pain       Comments 6MWT Results                Exercise Comments:   Exercise Comments     Row Name 05/03/22 1100           Exercise Comments First full day of exercise!  Patient was oriented to gym and equipment including functions, settings,  policies, and procedures.  Patient's individual exercise prescription and treatment plan were reviewed.  All starting workloads were established based on the results of the 6 minute walk test done at initial orientation visit.  The plan for exercise progression was also introduced and progression will be customized based on patient's performance and goals.                Exercise Goals and Review:   Exercise Goals     Row Name 04/27/22 1449             Exercise Goals   Increase Physical Activity Yes       Intervention Provide advice, education, support and counseling about physical activity/exercise needs.;Develop an individualized exercise prescription for aerobic and resistive training based on initial evaluation findings, risk stratification, comorbidities and participant's personal goals.       Expected Outcomes Short Term: Attend rehab on a regular basis to increase amount of physical activity.;Long Term: Add in home exercise to make exercise part of routine and to increase  amount of physical activity.;Long Term: Exercising regularly at least 3-5 days a week.       Increase Strength and Stamina Yes       Intervention Provide advice, education, support and counseling about physical activity/exercise needs.;Develop an individualized exercise prescription for aerobic and resistive training based on initial evaluation findings, risk stratification, comorbidities and participant's personal goals.       Expected Outcomes Short Term: Perform resistance training exercises routinely during rehab and add in resistance training at home;Short Term: Increase workloads from initial exercise prescription for resistance, speed, and METs.;Long Term: Improve cardiorespiratory fitness, muscular endurance and strength as measured by increased METs and functional capacity (6MWT)       Able to understand and use rate of perceived exertion (RPE) scale Yes       Intervention Provide education and explanation  on how to use RPE scale       Expected Outcomes Short Term: Able to use RPE daily in rehab to express subjective intensity level;Long Term:  Able to use RPE to guide intensity level when exercising independently       Able to understand and use Dyspnea scale Yes       Intervention Provide education and explanation on how to use Dyspnea scale       Expected Outcomes Long Term: Able to use Dyspnea scale to guide intensity level when exercising independently;Short Term: Able to use Dyspnea scale daily in rehab to express subjective sense of shortness of breath during exertion       Knowledge and understanding of Target Heart Rate Range (THRR) Yes       Intervention Provide education and explanation of THRR including how the numbers were predicted and where they are located for reference       Expected Outcomes Short Term: Able to state/look up THRR;Long Term: Able to use THRR to govern intensity when exercising independently;Short Term: Able to use daily as guideline for intensity in rehab       Able to check pulse independently Yes       Intervention Provide education and demonstration on how to check pulse in carotid and radial arteries.;Review the importance of being able to check your own pulse for safety during independent exercise       Expected Outcomes Short Term: Able to explain why pulse checking is important during independent exercise;Long Term: Able to check pulse independently and accurately       Understanding of Exercise Prescription Yes       Intervention Provide education, explanation, and written materials on patient's individual exercise prescription       Expected Outcomes Short Term: Able to explain program exercise prescription;Long Term: Able to explain home exercise prescription to exercise independently                Exercise Goals Re-Evaluation :  Exercise Goals Re-Evaluation     Row Name 05/03/22 1100             Exercise Goal Re-Evaluation   Exercise Goals  Review Able to understand and use rate of perceived exertion (RPE) scale;Able to understand and use Dyspnea scale;Knowledge and understanding of Target Heart Rate Range (THRR);Understanding of Exercise Prescription       Comments Reviewed RPE scale, THR and program prescription with pt today.  Pt voiced understanding and was given a copy of goals to take home.       Expected Outcomes Short: Use RPE daily to regulate intensity. Long: Follow program prescription in  THR.                Discharge Exercise Prescription (Final Exercise Prescription Changes):  Exercise Prescription Changes - 04/27/22 1400       Response to Exercise   Blood Pressure (Admit) 116/62    Blood Pressure (Exercise) 124/66    Blood Pressure (Exit) 104/64    Heart Rate (Admit) 66 bpm    Heart Rate (Exercise) 96 bpm    Heart Rate (Exit) 69 bpm    Oxygen Saturation (Admit) 98 %    Oxygen Saturation (Exercise) 96 %    Oxygen Saturation (Exit) 99 %    Rating of Perceived Exertion (Exercise) 13    Perceived Dyspnea (Exercise) 2    Symptoms SOB Hip Pain    Comments 6MWT Results             Nutrition:  Target Goals: Understanding of nutrition guidelines, daily intake of sodium 1500mg , cholesterol 200mg , calories 30% from fat and 7% or less from saturated fats, daily to have 5 or more servings of fruits and vegetables.  Education: All About Nutrition: -Group instruction provided by verbal, written material, interactive activities, discussions, models, and posters to present general guidelines for heart healthy nutrition including fat, fiber, MyPlate, the role of sodium in heart healthy nutrition, utilization of the nutrition label, and utilization of this knowledge for meal planning. Follow up email sent as well. Written material given at graduation.   Biometrics:  Pre Biometrics - 04/27/22 1449       Pre Biometrics   Height 5\' 4"  (1.626 m)    Weight 166 lb 11.2 oz (75.6 kg)    Waist Circumference 34.5  inches    Hip Circumference 40.5 inches    Waist to Hip Ratio 0.85 %    BMI (Calculated) 28.6    Single Leg Stand 2.4 seconds   Left Leg             Nutrition Therapy Plan and Nutrition Goals:  Nutrition Therapy & Goals - 04/27/22 1028       Nutrition Therapy   Diet Heart healthy, low Na, pulmonary MNT    Protein (specify units) 90-95g    Fiber 25 grams    Whole Grain Foods 3 servings    Saturated Fats 12 max. grams    Fruits and Vegetables 8 servings/day    Sodium 1.5 grams      Personal Nutrition Goals   Nutrition Goal ST: practice MyPlate guidelines, include 2-3 snacks with fiber, protein, and heart healthy fat  LT: follow MyPlate guidelines, limit Na <1.5g/day, eat a variety of fruits and vegetables    Comments 66 y.o. F admitted to pulmonary rehab s/p dyspnea. PMHx includes HTN, migraine, CKD stg 3a, Sjogrens syndrome. Relevant medication includes dexilant, folic acid, pilocarpine. Arij reports having abdominal surgery to rotate her bowel back to normal position. She is having pain from a new hernia due to surgery, but the MD reports She reports losing 12 lbs in the last 7 weeks due to loss of appetite. She reports having irregular eating habits now  such as snacking or rice pudding, oatmeal, 1 meal per day includes pork, chicken, starchy vegetables and some non-starchy vegetables (she doesn't like most leafy greens or cruciferous vegetables). She reports going out to eat more now as she has not been cooking much at home since the surgery. Discussed sodium concerns with eating out and encouraged to get back into the habit of eating more foods at home. [employer]  reports having a hard time recalling foods at this time. Drinks: water. Discussed general healthy eating and pulmnary MNT. discussed including snacks during the day that include protein, fiber, and healthy fats to boost her nutrition and to ensure she is meeting her energy and protein needs to avoid losing msucle tissue. Some  snacks may inlcude things like fruit and peanut butter or greek yogurt.      Intervention Plan   Intervention Prescribe, educate and counsel regarding individualized specific dietary modifications aiming towards targeted core components such as weight, hypertension, lipid management, diabetes, heart failure and other comorbidities.;Nutrition handout(s) given to patient.    Expected Outcomes Short Term Goal: A plan has been developed with personal nutrition goals set during dietitian appointment.;Short Term Goal: Understand basic principles of dietary content, such as calories, fat, sodium, cholesterol and nutrients.;Long Term Goal: Adherence to prescribed nutrition plan.             Nutrition Assessments:  MEDIFICTS Score Key: ?70 Need to make dietary changes  40-70 Heart Healthy Diet ? 40 Therapeutic Level Cholesterol Diet  Flowsheet Row Pulmonary Rehab from 04/27/2022 in [facility] Cardiac and Pulmonary Rehab  Picture Your Plate Total Score on Admission 62      Picture Your Plate Scores: [id number] Unhealthy dietary pattern with much room for improvement. 41-50 Dietary pattern unlikely to meet recommendations for good health and room for improvement. 51-60 More healthful dietary pattern, with some room for improvement.  >60 Healthy dietary pattern, although there may be some specific behaviors that could be improved.   Nutrition Goals Re-Evaluation:   Nutrition Goals Discharge (Final Nutrition Goals Re-Evaluation):   Psychosocial: Target Goals: Acknowledge presence or absence of significant depression and/or stress, maximize coping skills, provide positive support system. Participant is able to verbalize types and ability to use techniques and skills needed for reducing stress and depression.   Education: Stress, Anxiety, and Depression - Group verbal and visual presentation to define topics covered.  Reviews how body is impacted by stress, anxiety, and depression.  Also discusses  healthy ways to reduce stress and to treat/manage anxiety and depression.  Written material given at graduation.   Education: Sleep Hygiene -Provides group verbal and written instruction about how sleep can affect your health.  Define sleep hygiene, discuss sleep cycles and impact of sleep habits. Review good sleep hygiene tips.    Initial Review & Psychosocial Screening:  Initial Psych Review & Screening - 04/26/22 1114       Initial Review   Current issues with Current Sleep Concerns;Current Stress Concerns    Source of Stress Concerns Chronic Illness      Family Dynamics   Good Support System? Yes    Comments Teigan states that she does not sleep very well. She wakes up every couple hours or so. She can look to her family for support and has great support from the rest of her family. Ashly may have to go back for more abnominal surgery due to a hernia which creates some stress.      Barriers   Psychosocial barriers to participate in program The patient should benefit from training in stress management and relaxation.;There are no identifiable barriers or psychosocial needs.      Screening Interventions   Interventions Encouraged to exercise;To provide support and resources with identified psychosocial needs;Provide feedback about the scores to participant    Expected Outcomes Short Term goal: Utilizing psychosocial counselor, staff and physician to assist with identification of specific Stressors or current issues  interfering with healing process. Setting desired goal for each stressor or current issue identified.;Long Term Goal: Stressors or current issues are controlled or eliminated.;Short Term goal: Identification and review with participant of any Quality of Life or Depression concerns found by scoring the questionnaire.;Long Term goal: The participant improves quality of Life and PHQ9 Scores as seen by post scores and/or verbalization of changes             Quality of Life  Scores:  Scores of 19 and below usually indicate a poorer quality of life in these areas.  A difference of  2-3 points is a clinically meaningful difference.  A difference of 2-3 points in the total score of the Quality of Life Index has been associated with significant improvement in overall quality of life, self-image, physical symptoms, and general health in studies assessing change in quality of life.  PHQ-9: Review Flowsheet       04/27/2022  Depression screen PHQ 2/9  Decreased Interest 1  Down, Depressed, Hopeless 1  PHQ - 2 Score 2  Altered sleeping 3  Tired, decreased energy 3  Change in appetite 3  Feeling bad or failure about yourself  1  Trouble concentrating 0  Moving slowly or fidgety/restless 0  Suicidal thoughts 0  PHQ-9 Score 12  Difficult doing work/chores Very difficult   Interpretation of Total Score  Total Score Depression Severity:  1-4 = Minimal depression, 5-9 = Mild depression, 10-14 = Moderate depression, 15-19 = Moderately severe depression, 20-27 = Severe depression   Psychosocial Evaluation and Intervention:  Psychosocial Evaluation - 04/26/22 1116       Psychosocial Evaluation & Interventions   Interventions Encouraged to exercise with the program and follow exercise prescription;Relaxation education;Stress management education    Comments Burnestine states that she does not sleep very well. She wakes up every couple hours or so. She can look to her family for support and has great support from the rest of her family.    Expected Outcomes Short: Start [facility] to help with mood. Long: Maintain a healthy mental state.    Continue Psychosocial Services  Follow up required by staff             Psychosocial Re-Evaluation:   Psychosocial Discharge (Final Psychosocial Re-Evaluation):   Education: Education Goals: Education classes will be provided on a weekly basis, covering required topics. Participant will state understanding/return  demonstration of topics presented.  Learning Barriers/Preferences:  Learning Barriers/Preferences - 04/26/22 1113       Learning Barriers/Preferences   Learning Barriers None    Learning Preferences None             General Pulmonary Education Topics:  Infection Prevention: - Provides verbal and written material to individual with discussion of infection control including proper hand washing and proper equipment cleaning during exercise session. Flowsheet Row Pulmonary Rehab from 04/27/2022 in [facility]  Date 04/26/22  Educator [facility]  Instruction Review Code 1- Verbalizes Understanding       Falls Prevention: - Provides verbal and written material to individual with discussion of falls prevention and safety. Flowsheet Row Pulmonary Rehab from 04/27/2022 in [facility] Cardiac and Pulmonary Rehab  Date 04/26/22  Educator [facility]  Instruction Review Code 1- Verbalizes Understanding       Chronic Lung Disease Review: - Group verbal instruction with posters, models, PowerPoint presentations and videos,  to review new updates, new respiratory medications, new advancements in procedures and treatments. Providing information on websites and "800"  numbers for continued self-education. Includes information about supplement oxygen, available portable oxygen systems, continuous and intermittent flow rates, oxygen safety, concentrators, and Medicare reimbursement for oxygen. Explanation of Pulmonary Drugs, including class, frequency, complications, importance of spacers, rinsing mouth after steroid MDI's, and proper cleaning methods for nebulizers. Review of basic lung anatomy and physiology related to function, structure, and complications of lung disease. Review of risk factors. Discussion about methods for diagnosing sleep apnea and types of masks and machines for OSA. Includes a review of the use of types of environmental controls: home humidity, furnaces, filters, dust  mite/pet prevention, HEPA vacuums. Discussion about weather changes, air quality and the benefits of nasal washing. Instruction on Warning signs, infection symptoms, calling MD promptly, preventive modes, and value of vaccinations. Review of effective airway clearance, coughing and/or vibration techniques. Emphasizing that all should Create an Action Plan. Written material given at graduation. Flowsheet Row Pulmonary Rehab from 04/27/2022 in [facility] Cardiac and Pulmonary Rehab  Education need identified 04/27/22       AED/CPR: - Group verbal and written instruction with the use of models to demonstrate the basic use of the AED with the basic ABC's of resuscitation.    Anatomy and Cardiac Procedures: - Group verbal and visual presentation and models provide information about basic cardiac anatomy and function. Reviews the testing methods done to diagnose heart disease and the outcomes of the test results. Describes the treatment choices: Medical Management, Angioplasty, or Coronary Bypass Surgery for treating various heart conditions including Myocardial Infarction, Angina, Valve Disease, and Cardiac Arrhythmias.  Written material given at graduation.   Medication Safety: - Group verbal and visual instruction to review commonly prescribed medications for heart and lung disease. Reviews the medication, class of the drug, and side effects. Includes the steps to properly store meds and maintain the prescription regimen.  Written material given at graduation.   Other: -Provides group and verbal instruction on various topics (see comments)   Knowledge Questionnaire Score:  Knowledge Questionnaire Score - 04/27/22 1422       Knowledge Questionnaire Score   Pre Score 16/18              Core Components/Risk Factors/Patient Goals at Admission:  Personal Goals and Risk Factors at Admission - 04/27/22 1424       Core Components/Risk Factors/Patient Goals on Admission    Weight Management  Yes;Weight Loss    Intervention Weight Management: Develop a combined nutrition and exercise program designed to reach desired caloric intake, while maintaining appropriate intake of nutrient and fiber, sodium and fats, and appropriate energy expenditure required for the weight goal.;Weight Management: Provide education and appropriate resources to help participant work on and attain dietary goals.;Weight Management/Obesity: Establish reasonable short term and long term weight goals.    Admit Weight 166 lb 11.2 oz (75.6 kg)    Goal Weight: Short Term 160 lb (72.6 kg)    Goal Weight: Long Term 155 lb (70.3 kg)    Expected Outcomes Short Term: Continue to assess and modify interventions until short term weight is achieved;Long Term: Adherence to nutrition and physical activity/exercise program aimed toward attainment of established weight goal;Weight Loss: Understanding of general recommendations for a balanced deficit meal plan, which promotes 1-2 lb weight loss per week and includes a negative energy balance of [phone] kcal/d;Understanding recommendations for meals to include 15-35% energy as protein, 25-35% energy from fat, 35-60% energy from carbohydrates, less than 200mg  of dietary cholesterol, 20-35 gm of total fiber daily;Understanding of distribution of  calorie intake throughout the day with the consumption of 4-5 meals/snacks    Improve shortness of breath with ADL's Yes    Intervention Provide education, individualized exercise plan and daily activity instruction to help decrease symptoms of SOB with activities of daily living.    Expected Outcomes Short Term: Improve cardiorespiratory fitness to achieve a reduction of symptoms when performing ADLs;Long Term: Be able to perform more ADLs without symptoms or delay the onset of symptoms    Hypertension Yes    Intervention Provide education on lifestyle modifcations including regular physical activity/exercise, weight management, moderate sodium  restriction and increased consumption of fresh fruit, vegetables, and low fat dairy, alcohol moderation, and smoking cessation.;Monitor prescription use compliance.    Expected Outcomes Short Term: Continued assessment and intervention until BP is < 140/15mm HG in hypertensive participants. < 130/68mm HG in hypertensive participants with diabetes, heart failure or chronic kidney disease.;Long Term: Maintenance of blood pressure at goal levels.             Education:Diabetes - Individual verbal and written instruction to review signs/symptoms of diabetes, desired ranges of glucose level fasting, after meals and with exercise. Acknowledge that pre and post exercise glucose checks will be done for 3 sessions at entry of program.   Know Your Numbers and Heart Failure: - Group verbal and visual instruction to discuss disease risk factors for cardiac and pulmonary disease and treatment options.  Reviews associated critical values for Overweight/Obesity, Hypertension, Cholesterol, and Diabetes.  Discusses basics of heart failure: signs/symptoms and treatments.  Introduces Heart Failure Zone chart for action plan for heart failure.  Written material given at graduation.   Core Components/Risk Factors/Patient Goals Review:    Core Components/Risk Factors/Patient Goals at Discharge (Final Review):    ITP Comments:  ITP Comments     Row Name 04/26/22 1111 04/27/22 1421 05/03/22 1100 05/12/22 0948     ITP Comments Virtual Visit completed. Patient informed on EP and RD appointment and 6 Minute walk test. Patient also informed of patient health questionnaires on My Chart. Patient Verbalizes understanding. Visit diagnosis can be found in [facility] 04/12/2022. Completed 6MWT and gym orientation. Initial ITP created and sent for review to Dr. Ottie Glazier, Medical Director. First full day of exercise!  Patient was oriented to gym and equipment including functions, settings, policies, and procedures.   Patient's individual exercise prescription and treatment plan were reviewed.  All starting workloads were established based on the results of the 6 minute walk test done at initial orientation visit.  The plan for exercise progression was also introduced and progression will be customized based on patient's performance and goals. 30 Day review completed. Medical Director ITP review done, changes made as directed, and signed approval by Medical Director.    new to program             Comments:

## 2022-05-12 NOTE — Progress Notes (Signed)
Daily Session Note  Patient Details  Name: Amy Huynh MRN: [id number] Date of Birth: May 12, 1956 Referring Provider:   Flowsheet Row Pulmonary Rehab from 04/27/2022 in [facility] Cardiac and Pulmonary Rehab  Referring Provider Dr Ottie Glazier       Encounter Date: 05/12/2022  Check In:  Session Check In - 05/12/22 1037       Check-In   Supervising physician immediately available to respond to emergencies See telemetry face sheet for immediately available ER MD    Location [facility]-Cardiac & Pulmonary Rehab    Staff Present Darlyne Russian, RN, ADN;Jessica Luan Pulling, MA, RCEP, CCRP, CCET;Noah Tickle, BS, Exercise Physiologist    Virtual Visit No    Medication changes reported     No    Fall or balance concerns reported    No    Warm-up and Cool-down Performed on first and last piece of equipment    Resistance Training Performed Yes    VAD Patient? No    PAD/SET Patient? No      Pain Assessment   Currently in Pain? No/denies                Social History   Tobacco Use  Smoking Status Never  Smokeless Tobacco Never    Goals Met:  Independence with exercise equipment Exercise tolerated well No report of concerns or symptoms today Strength training completed today  Goals Unmet:  Not Applicable  Comments: Pt able to follow exercise prescription today without complaint.  Will continue to monitor for progression.    Dr. Emily Filbert is Medical Director for [place].  Dr. Ottie Glazier is Medical Director for [facility].

## 2022-05-15 ENCOUNTER — Other Ambulatory Visit: Payer: Self-pay

## 2022-05-18 ENCOUNTER — Ambulatory Visit: Payer: Medicare PPO | Admitting: Gastroenterology

## 2022-05-18 ENCOUNTER — Encounter: Payer: Self-pay | Admitting: Gastroenterology

## 2022-05-18 VITALS — BP 100/68 | HR 80 | Temp 97.9°F | Ht 62.0 in | Wt 163.0 lb

## 2022-05-18 DIAGNOSIS — R1013 Epigastric pain: Secondary | ICD-10-CM | POA: Diagnosis not present

## 2022-05-18 DIAGNOSIS — G8929 Other chronic pain: Secondary | ICD-10-CM

## 2022-05-18 NOTE — Progress Notes (Signed)
Primary Care Physician: Gladstone Lighter, MD  Primary Gastroenterologist:  Dr. Lucilla Lame  Chief Complaint  Patient presents with   Hospitalization Follow-up   Abdominal Pain    Pt reports the pain is mostly LLQ and describes the pain as burning   Nausea    intermittent   Melena    Pt reports this has resolved since last week    HPI: Amy Huynh is a 67 y.o. 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  The patient had contacted our office on 20 December with a report of black stools.  Her blood count was sent off to see if she was anemic and her hemoglobin showed:  Component     Latest Ref Rng 03/12/2022 03/13/2022 05/05/2022  Hemoglobin     12.0 - 15.0 g/dL 12.2  10.4 (L)  11.5 (L)   HCT     36.0 - 46.0 % 39.4  33.8 (L)  40.0    The patient then contacted [place] the next day stating that she was having periumbilical pain radiating into the epigastric area and was told to come in for an office visit since this was something we had not seen her for in the past and from her description could not tell whether it was a GI pain or musculoskeletal. The patient reports that her abdominal pain is a burning pain and is not made any better or worse with eating or drinking.  She also reports that the pain is better when she sits down and is still.  She has a history of chronic back pain.   Past Medical History:  Diagnosis Date   Arthritis    Family history of ovarian cancer 12/2016   MyRisk neg   Fibromyalgia    Migraine    Neuropathy    Placenta previa    Sjogren's disease ([place])     Current Outpatient Medications  Medication Sig Dispense Refill   Apremilast (OTEZLA) 30 MG TABS Take 30 mg by mouth 2 (two) times daily.     butalbital-acetaminophen-caffeine (FIORICET, ESGIC) 50-325-40 MG tablet Take 1 tablet by mouth every 6 (six) hours as needed for headache.     cetirizine (ZYRTEC) 10 MG  tablet Take 10 mg by mouth daily.     clobetasol cream (TEMOVATE) 0.05 % Apply topically.     cyanocobalamin 1000 MCG tablet Take 1,000 mcg by mouth daily.     dexlansoprazole (DEXILANT) 60 MG capsule TAKE 1 CAPSULE(60 MG) BY MOUTH DAILY 90 capsule 0   Eptinezumab-jjmr (VYEPTI) 100 MG/ML injection Inject 100 mg into the vein every 3 (three) months. Last Saturday     fexofenadine (ALLEGRA) 180 MG tablet Take 180 mg by mouth daily.     folic acid (FOLVITE) 1 MG tablet Take 1 mg by mouth daily.     gabapentin (NEURONTIN) 300 MG capsule Take 900 mg by mouth at bedtime.     hydrOXYzine (ATARAX/VISTARIL) 10 MG tablet Take 10 mg by mouth every 8 (eight) hours as needed.     hyoscyamine (LEVSIN, ANASPAZ) 0.125 MG tablet Take 0.125 mg by mouth every 4 (four) hours as needed.     methocarbamol (ROBAXIN) 500 MG tablet Take 500 mg by mouth at bedtime.      methotrexate (RHEUMATREX) 2.5 MG tablet Take 20 mg by mouth once a week. Take 4 tablets (10 mg ) by mouth in the morning and 4 tablets(10 mg by  mouth in the evening on Saturdays (total= 20 mg)     ondansetron (ZOFRAN-ODT) 4 MG disintegrating tablet Take 1 tablet (4 mg total) by mouth every 8 (eight) hours as needed for nausea or vomiting. 20 tablet 0   pilocarpine (SALAGEN) 5 MG tablet Take 5 mg by mouth 3 (three) times daily.     promethazine (PHENERGAN) 25 MG tablet Take 25 mg by mouth every 6 (six) hours as needed for nausea or vomiting.     rizatriptan (MAXALT) 10 MG tablet Take 10 mg by mouth as needed for migraine. May repeat in 2 hours if needed     rOPINIRole (REQUIP) 2 MG tablet Take 2 mg by mouth at bedtime.     topiramate (TOPAMAX) 100 MG tablet Take 200 mg by mouth daily.     valACYclovir (VALTREX) 1000 MG tablet      No current facility-administered medications for this visit.    Allergies as of 05/18/2022 - Review Complete 05/18/2022  Allergen Reaction Noted   Hydrocodone-acetaminophen  09/24/2015   Hydrocodone-acetaminophen  09/24/2015    Morphine Itching and Other (See Comments) 10/03/2012   Morphine and related Itching 10/03/2012   Cymbalta [duloxetine hcl] Other (See Comments) 12/29/2016   Doxycycline  02/17/2022   Duloxetine Other (See Comments) 07/23/2015   Pregabalin Other (See Comments) 09/24/2015    ROS:  General: Negative for anorexia, weight loss, fever, chills, fatigue, weakness. ENT: Negative for hoarseness, difficulty swallowing , nasal congestion. CV: Negative for chest pain, angina, palpitations, dyspnea on exertion, peripheral edema.  Respiratory: Negative for dyspnea at rest, dyspnea on exertion, cough, sputum, wheezing.  GI: See history of present illness. GU:  Negative for dysuria, hematuria, urinary incontinence, urinary frequency, nocturnal urination.  Endo: Negative for unusual weight change.    Physical Examination:   BP 100/68 (BP Location: Left Arm, Patient Position: Sitting, Cuff Size: Large)   Pulse 80   Temp 97.9 F (36.6 C) (Oral)   Ht 5\' 2"  (1.575 m)   Wt 163 lb (73.9 kg)   BMI 29.81 kg/m   General: Well-nourished, well-developed in no acute distress.  Eyes: No icterus. Conjunctivae pink. Abdomen: Bowel sounds are normal, positive tenderness with 1 finger palpation while flexing the abdominal wall muscles, nondistended, no hepatosplenomegaly or masses, no abdominal bruits or hernia , no rebound or guarding.   Extremities: No lower extremity edema. No clubbing or deformities. Neuro: Alert and oriented x 3.  Grossly intact. Skin: Warm and dry, no jaundice.   Psych: Alert and cooperative, normal mood and affect.  Labs:    Imaging Studies: CT ABDOMEN PELVIS W CONTRAST  Result Date: 05/06/2022 CLINICAL DATA:  Acute abdominal pain. Abdominal surgery 8 weeks ago. EXAM: CT ABDOMEN AND PELVIS WITH CONTRAST TECHNIQUE: Multidetector CT imaging of the abdomen and pelvis was performed using the standard protocol following bolus administration of intravenous contrast. RADIATION DOSE  REDUCTION: This exam was performed according to the departmental dose-optimization program which includes automated exposure control, adjustment of the mA and/or kV according to patient size and/or use of iterative reconstruction technique. CONTRAST:  111mL OMNIPAQUE IOHEXOL 300 MG/ML  SOLN COMPARISON:  CT abdomen and pelvis 04/23/2022 FINDINGS: Lower chest: No acute abnormality. Hepatobiliary: Common bile duct is enlarged status post cholecystectomy, unchanged. There are no focal liver lesions. Pancreas: Unremarkable. No pancreatic ductal dilatation or surrounding inflammatory changes. Spleen: Normal in size without focal abnormality. Adrenals/Urinary Tract: Adrenal glands are unremarkable. Kidneys are normal, without renal calculi, focal lesion, or hydronephrosis. Bladder is unremarkable. Stomach/Bowel:  Stomach is within normal limits. Appendix appears normal. No evidence of bowel wall thickening, distention, or inflammatory changes. There is sigmoid colon diverticulosis. Vascular/Lymphatic: No significant vascular findings are present. No enlarged abdominal or pelvic lymph nodes. Reproductive: Uterus and bilateral adnexa are unremarkable. Other: Midline abdominal scar is again seen with small fat containing peri-incisional hernia. Appearance is unchanged from prior. There is no free fluid or free air. Are no enhancing fluid collections. Musculoskeletal: No acute or significant osseous findings. IMPRESSION: 1. No acute process in the abdomen or pelvis. 2. Sigmoid colon diverticulosis. 3. Stable small fat containing peri-incisional hernia. Electronically Signed   By: Darliss Cheney M.D.   On: 05/06/2022 00:52   CT Angio Chest Pulmonary Embolism (PE) W or WO Contrast  Result Date: 05/04/2022 CLINICAL DATA:  Positive D-dimer.  Concern for pulmonary embolism. EXAM: CT ANGIOGRAPHY CHEST WITH CONTRAST TECHNIQUE: Multidetector CT imaging of the chest was performed using the standard protocol during bolus  administration of intravenous contrast. Multiplanar CT image reconstructions and MIPs were obtained to evaluate the vascular anatomy. RADIATION DOSE REDUCTION: This exam was performed according to the departmental dose-optimization program which includes automated exposure control, adjustment of the mA and/or kV according to patient size and/or use of iterative reconstruction technique. CONTRAST:  54mL OMNIPAQUE IOHEXOL 350 MG/ML SOLN COMPARISON:  None Available. FINDINGS: Cardiovascular: No filling defects within the pulmonary arteries to suggest acute pulmonary embolism. Mediastinum/Nodes: No axillary or supraclavicular adenopathy. No mediastinal or hilar adenopathy. No pericardial fluid. Esophagus normal. Lungs/Pleura: No pulmonary infarction. No pneumonia. No pleural fluid. No pneumothorax Upper Abdomen: Limited view of the liver, kidneys, pancreas are unremarkable. Normal adrenal glands. Musculoskeletal: No aggressive osseous lesion. Review of the MIP images confirms the above findings. IMPRESSION: 1. No acute pulmonary embolism. 2. No acute pulmonary parenchymal findings. Electronically Signed   By: Genevive Bi M.D.   On: 05/04/2022 11:18   CT ABDOMEN PELVIS W CONTRAST  Result Date: 04/24/2022 CLINICAL DATA:  Left upper quadrant abdominal pain EXAM: CT ABDOMEN AND PELVIS WITH CONTRAST TECHNIQUE: Multidetector CT imaging of the abdomen and pelvis was performed using the standard protocol following bolus administration of intravenous contrast. RADIATION DOSE REDUCTION: This exam was performed according to the departmental dose-optimization program which includes automated exposure control, adjustment of the mA and/or kV according to patient size and/or use of iterative reconstruction technique. CONTRAST:  OMNIPAQUE IOHEXOL 300 MG/ML  SOLN COMPARISON:  04/07/2022, 02/19/2022 FINDINGS: Lower chest: No acute pleural or parenchymal lung disease. Hepatobiliary: No focal liver abnormality is seen.  Status post cholecystectomy. No biliary dilatation. Pancreas: Unremarkable. No pancreatic ductal dilatation or surrounding inflammatory changes. Spleen: Normal in size without focal abnormality. Adrenals/Urinary Tract: Adrenal glands are unremarkable. Kidneys are normal, without renal calculi, focal lesion, or hydronephrosis. Bladder is unremarkable. Stomach/Bowel: No bowel obstruction or ileus. Normal retrocecal appendix. No bowel wall thickening or inflammatory change. Vascular/Lymphatic: No significant vascular findings are present. No enlarged abdominal or pelvic lymph nodes. Reproductive: Uterus and bilateral adnexa are unremarkable. Other: No free fluid or free intraperitoneal gas. Postsurgical changes from prior midline laparotomy, with small fat containing peri-incisional ventral hernia. No bowel herniation. Musculoskeletal: No acute or destructive bony lesions. Reconstructed images demonstrate no additional findings. IMPRESSION: 1. No acute intra-abdominal or intrapelvic process. 2. Postsurgical changes from prior midline laparotomy, with small fat containing peri-incisional hernia. No bowel herniation. Electronically Signed   By: Sharlet Salina M.D.   On: 04/24/2022 22:35    Assessment and Plan:   Parke Simmers [facility]  is a 67 y.o. 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 The patient's abdominal pain is consistent with musculoskeletal pain and not intestinal pain.  The patient has been told to treat it with compresses and avoiding anything that exacerbates the pain.  The patient has been told that if she has any further black stools that she should contact me and we would have a low threshold to repeat EGD since she has a history of peptic ulcer disease.  The patient is presently taking a PPI and will continue that.  The patient has been explained the plan and agrees with  it.    Midge Minium, MD. Clementeen Graham    Note: This dictation was prepared with Dragon dictation along with smaller phrase technology. Any transcriptional errors that result from this process are unintentional.

## 2022-05-19 ENCOUNTER — Encounter: Payer: Medicare PPO | Attending: Pulmonary Disease | Admitting: *Deleted

## 2022-05-19 DIAGNOSIS — R0600 Dyspnea, unspecified: Secondary | ICD-10-CM | POA: Diagnosis not present

## 2022-05-19 NOTE — Progress Notes (Signed)
Daily Session Note  Patient Details  Name: Amy Huynh MRN: [id number] Date of Birth: July 18, 1955 Referring Provider:   Flowsheet Row Pulmonary Rehab from 04/27/2022 in [facility] Cardiac and Pulmonary Rehab  Referring Provider Dr Ottie Glazier       Encounter Date: 05/19/2022  Check In:  Session Check In - 05/19/22 1123       Check-In   Supervising physician immediately available to respond to emergencies See telemetry face sheet for immediately available ER MD    Location [facility]-Cardiac & Pulmonary Rehab    Staff Present Darlyne Russian, RN, ADN;Meredith Sherryll Burger, RN BSN;Joseph Hood, RCP,RRT,BSRT;Noah Tickle, BS, Exercise Physiologist    Virtual Visit No    Medication changes reported     No    Fall or balance concerns reported    No    Warm-up and Cool-down Performed on first and last piece of equipment    Resistance Training Performed Yes    VAD Patient? No    PAD/SET Patient? No      Pain Assessment   Currently in Pain? No/denies                Social History   Tobacco Use  Smoking Status Never  Smokeless Tobacco Never    Goals Met:  Independence with exercise equipment Exercise tolerated well No report of concerns or symptoms today Strength training completed today  Goals Unmet:  Not Applicable  Comments: Pt able to follow exercise prescription today without complaint.  Will continue to monitor for progression.    Dr. Emily Filbert is Medical Director for [place].  Dr. Ottie Glazier is Medical Director for [facility] Pulmonary Rehabilitation.

## 2022-05-21 ENCOUNTER — Encounter: Payer: Medicare PPO | Admitting: *Deleted

## 2022-05-21 DIAGNOSIS — R0600 Dyspnea, unspecified: Secondary | ICD-10-CM

## 2022-05-21 NOTE — Progress Notes (Signed)
Daily Session Note  Patient Details  Name: Amy Huynh MRN: [id number] Date of Birth: 1955/08/08 Referring Provider:   Flowsheet Row Pulmonary Rehab from 04/27/2022 in [facility] Cardiac and Pulmonary Rehab  Referring Provider Dr Ottie Glazier       Encounter Date: 05/21/2022  Check In:  Session Check In - 05/21/22 1154       Check-In   Supervising physician immediately available to respond to emergencies See telemetry face sheet for immediately available ER MD    Location [facility]-Cardiac & Pulmonary Rehab    Staff Present Heath Lark, RN, BSN, CCRP;Jessica [place] , MA, RCEP, CCRP, CCET;Joseph [place], [place]    Virtual Visit No    Medication changes reported     No    Fall or balance concerns reported    No    Warm-up and Cool-down Performed on first and last piece of equipment    Resistance Training Performed Yes    VAD Patient? No    PAD/SET Patient? No      Pain Assessment   Currently in Pain? No/denies                Social History   Tobacco Use  Smoking Status Never  Smokeless Tobacco Never    Goals Met:  Proper associated with RPD/PD & O2 Sat Independence with exercise equipment Exercise tolerated well No report of concerns or symptoms today  Goals Unmet:  Not Applicable  Comments: Pt able to follow exercise prescription today without complaint.  Will continue to monitor for progression.    Dr. Emily Filbert is Medical Director for [place].  Dr. Ottie Glazier is Medical Director for [facility] Pulmonary Rehabilitation.

## 2022-05-24 ENCOUNTER — Encounter: Payer: Medicare PPO | Admitting: *Deleted

## 2022-05-26 ENCOUNTER — Encounter: Payer: Medicare PPO | Admitting: *Deleted

## 2022-05-28 ENCOUNTER — Encounter: Payer: Medicare PPO | Admitting: *Deleted

## 2022-05-28 DIAGNOSIS — R0600 Dyspnea, unspecified: Secondary | ICD-10-CM

## 2022-05-28 NOTE — Progress Notes (Signed)
Daily Session Note  Patient Details  Name: Amy Huynh MRN: [id number] Date of Birth: 03/08/1956 Referring Provider:   Flowsheet Row Pulmonary Rehab from 04/27/2022 in [facility] Cardiac and Pulmonary Rehab  Referring Provider Dr Ottie Glazier       Encounter Date: 05/28/2022  Check In:  Session Check In - 05/28/22 1148       Check-In   Supervising physician immediately available to respond to emergencies See telemetry face sheet for immediately available ER MD    Location [facility]-Cardiac & Pulmonary Rehab    Staff Present Heath Lark, RN, BSN, CCRP;Jessica [place], MA, RCEP, CCRP, CCET;Joseph [place], [place]    Virtual Visit No    Medication changes reported     No    Fall or balance concerns reported    No    Warm-up and Cool-down Performed on first and last piece of equipment    Resistance Training Performed Yes    VAD Patient? No    PAD/SET Patient? No      Pain Assessment   Currently in Pain? No/denies                Social History   Tobacco Use  Smoking Status Never  Smokeless Tobacco Never    Goals Met:  Proper associated with RPD/PD & O2 Sat Independence with exercise equipment Exercise tolerated well No report of concerns or symptoms today  Goals Unmet:  Not Applicable  Comments: Pt able to follow exercise prescription today without complaint.  Will continue to monitor for progression.    Dr. Emily Filbert is Medical Director for [place].  Dr. Ottie Glazier is Medical Director for [facility] Pulmonary Rehabilitation.

## 2022-05-31 ENCOUNTER — Encounter: Payer: Medicare PPO | Admitting: *Deleted

## 2022-06-02 ENCOUNTER — Encounter: Payer: Medicare PPO | Admitting: *Deleted

## 2022-06-02 DIAGNOSIS — R0600 Dyspnea, unspecified: Secondary | ICD-10-CM

## 2022-06-02 NOTE — Progress Notes (Signed)
Daily Session Note  Patient Details  Name: Amy Huynh MRN: [id number] Date of Birth: 01/11/1956 Referring Provider:   Flowsheet Row Pulmonary Rehab from 04/27/2022 in [facility] Cardiac and Pulmonary Rehab  Referring Provider Dr Ottie Glazier       Encounter Date: 06/02/2022  Check In:  Session Check In - 06/02/22 1114       Check-In   Supervising physician immediately available to respond to emergencies See telemetry face sheet for immediately available ER MD    Location [facility]-Cardiac & Pulmonary Rehab    Staff Present Darlyne Russian, RN, ADN;Joseph Tessie Fass, RCP,RRT,BSRT;Noah Tickle, BS, Exercise Physiologist;Meredith Sherryll Burger, RN BSN    Virtual Visit No    Medication changes reported     No    Fall or balance concerns reported    No    Warm-up and Cool-down Performed on first and last piece of equipment    Resistance Training Performed Yes    VAD Patient? No    PAD/SET Patient? No      Pain Assessment   Currently in Pain? No/denies                Social History   Tobacco Use  Smoking Status Never  Smokeless Tobacco Never    Goals Met:  Independence with exercise equipment Exercise tolerated well No report of concerns or symptoms today Strength training completed today  Goals Unmet:  Not Applicable  Comments: Pt able to follow exercise prescription today without complaint.  Will continue to monitor for progression.    Dr. Emily Filbert is Medical Director for [place].  Dr. Ottie Glazier is Medical Director for [facility] Pulmonary Rehabilitation.

## 2022-06-07 ENCOUNTER — Encounter: Payer: Medicare PPO | Admitting: *Deleted

## 2022-06-07 DIAGNOSIS — R0600 Dyspnea, unspecified: Secondary | ICD-10-CM

## 2022-06-07 NOTE — Progress Notes (Signed)
Daily Session Note  Patient Details  Name: Amy Huynh MRN: [id number] Date of Birth: 1955-12-10 Referring Provider:   Flowsheet Row Pulmonary Rehab from 04/27/2022 in [facility] Cardiac and Pulmonary Rehab  Referring Provider Dr Ottie Glazier       Encounter Date: 06/07/2022  Check In:  Session Check In - 06/07/22 1116       Check-In   Supervising physician immediately available to respond to emergencies See telemetry face sheet for immediately available ER MD    Staff Present Darlyne Russian, RN, Doyce Para, BS, ACSM CEP, Exercise Physiologist;Meredith Sherryll Burger, RN BSN;Noah Tickle, BS, Exercise Physiologist    Virtual Visit No    Medication changes reported     No    Fall or balance concerns reported    No    Warm-up and Cool-down Performed on first and last piece of equipment    Resistance Training Performed Yes    VAD Patient? No    PAD/SET Patient? No      Pain Assessment   Currently in Pain? No/denies                Social History   Tobacco Use  Smoking Status Never  Smokeless Tobacco Never    Goals Met:  Independence with exercise equipment Exercise tolerated well No report of concerns or symptoms today Strength training completed today  Goals Unmet:  Not Applicable  Comments: Pt able to follow exercise prescription today without complaint.  Will continue to monitor for progression.    Dr. Emily Filbert is Medical Director for [place].  Dr. Ottie Glazier is Medical Director for [facility] Pulmonary Rehabilitation.

## 2022-06-09 ENCOUNTER — Encounter: Payer: Medicare PPO | Admitting: *Deleted

## 2022-06-09 ENCOUNTER — Encounter: Payer: Self-pay | Admitting: *Deleted

## 2022-06-09 DIAGNOSIS — R0600 Dyspnea, unspecified: Secondary | ICD-10-CM | POA: Diagnosis not present

## 2022-06-09 NOTE — Progress Notes (Signed)
Pulmonary Individual Treatment Plan  Patient Details  Name: Amy Huynh MRN: [id number] Date of Birth: 13-Jan-1956 Referring Provider:   Flowsheet Row Pulmonary Rehab from 04/27/2022 in [facility] Cardiac and Pulmonary Rehab  Referring Provider Dr Ottie Glazier       Initial Encounter Date:  Flowsheet Row Pulmonary Rehab from 04/27/2022 in [facility] Cardiac and Pulmonary Rehab  Date 04/27/22       Visit Diagnosis: Dyspnea, unspecified type  Patient's Home Medications on Admission:  Current Outpatient Medications:    Apremilast (OTEZLA) 30 MG TABS, Take 30 mg by mouth 2 (two) times daily., Disp: , Rfl:    butalbital-acetaminophen-caffeine (FIORICET, ESGIC) 50-325-40 MG tablet, Take 1 tablet by mouth every 6 (six) hours as needed for headache., Disp: , Rfl:    cetirizine (ZYRTEC) 10 MG tablet, Take 10 mg by mouth daily., Disp: , Rfl:    clobetasol cream (TEMOVATE) 0.05 %, Apply topically., Disp: , Rfl:    cyanocobalamin 1000 MCG tablet, Take 1,000 mcg by mouth daily., Disp: , Rfl:    dexlansoprazole (DEXILANT) 60 MG capsule, TAKE 1 CAPSULE(60 MG) BY MOUTH DAILY, Disp: 90 capsule, Rfl: 0   Eptinezumab-jjmr (VYEPTI) 100 MG/ML injection, Inject 100 mg into the vein every 3 (three) months. Last Saturday, Disp: , Rfl:    fexofenadine (ALLEGRA) 180 MG tablet, Take 180 mg by mouth daily., Disp: , Rfl:    folic acid (FOLVITE) 1 MG tablet, Take 1 mg by mouth daily., Disp: , Rfl:    gabapentin (NEURONTIN) 300 MG capsule, Take 900 mg by mouth at bedtime., Disp: , Rfl:    hydrOXYzine (ATARAX/VISTARIL) 10 MG tablet, Take 10 mg by mouth every 8 (eight) hours as needed., Disp: , Rfl:    hyoscyamine (LEVSIN, ANASPAZ) 0.125 MG tablet, Take 0.125 mg by mouth every 4 (four) hours as needed., Disp: , Rfl:    methocarbamol (ROBAXIN) 500 MG tablet, Take 500 mg by mouth at bedtime. , Disp: , Rfl:    methotrexate (RHEUMATREX) 2.5 MG tablet, Take 20 mg by mouth once a week. Take 4 tablets (10 mg ) by  mouth in the morning and 4 tablets(10 mg by mouth in the evening on Saturdays (total= 20 mg), Disp: , Rfl:    ondansetron (ZOFRAN-ODT) 4 MG disintegrating tablet, Take 1 tablet (4 mg total) by mouth every 8 (eight) hours as needed for nausea or vomiting., Disp: 20 tablet, Rfl: 0   pilocarpine (SALAGEN) 5 MG tablet, Take 5 mg by mouth 3 (three) times daily., Disp: , Rfl:    promethazine (PHENERGAN) 25 MG tablet, Take 25 mg by mouth every 6 (six) hours as needed for nausea or vomiting., Disp: , Rfl:    rizatriptan (MAXALT) 10 MG tablet, Take 10 mg by mouth as needed for migraine. May repeat in 2 hours if needed, Disp: , Rfl:    rOPINIRole (REQUIP) 2 MG tablet, Take 2 mg by mouth at bedtime., Disp: , Rfl:    topiramate (TOPAMAX) 100 MG tablet, Take 200 mg by mouth daily., Disp: , Rfl:    valACYclovir (VALTREX) 1000 MG tablet, , Disp: , Rfl:   Past Medical History: Past Medical History:  Diagnosis Date   Arthritis    Family history of ovarian cancer 12/2016   MyRisk neg   Fibromyalgia    Migraine    Neuropathy    Placenta previa    Sjogren's disease ([place])     Tobacco Use: Social History   Tobacco Use  Smoking Status Never  Smokeless Tobacco  Never    Labs: Review Flowsheet        No data to display           Pulmonary Assessment Scores:  Pulmonary Assessment Scores     Row Name 04/27/22 1425         ADL UCSD   ADL Phase Entry     SOB Score total 57     Rest 0     Walk 3     Stairs 5     Bath 3     Dress 2     Shop 4       CAT Score   CAT Score 18       mMRC Score   mMRC Score 1              UCSD: Self-administered rating of dyspnea associated with activities of daily living (ADLs) 6-point scale (0 = "not at all" to 5 = "maximal or unable to do because of breathlessness")  Scoring Scores range from 0 to 120.  Minimally important difference is 5 units  CAT: CAT can identify the health impairment of COPD patients and is better correlated with disease  progression.  CAT has a scoring range of zero to 40. The CAT score is classified into four groups of low (less than 10), medium (10 - 20), high (21-30) and very high (31-40) based on the impact level of disease on health status. A CAT score over 10 suggests significant symptoms.  A worsening CAT score could be explained by an exacerbation, poor medication adherence, poor inhaler technique, or progression of COPD or comorbid conditions.  CAT MCID is 2 points  mMRC: mMRC (Modified Medical Research Council) Dyspnea Scale is used to assess the degree of baseline functional disability in patients of respiratory disease due to dyspnea. No minimal important difference is established. A decrease in score of 1 point or greater is considered a positive change.   Pulmonary Function Assessment:  Pulmonary Function Assessment - 04/26/22 1112       Breath   Shortness of Breath Yes;Limiting activity             Exercise Target Goals: Exercise Program Goal: Individual exercise prescription set using results from initial 6 min walk test and THRR while considering  patient's activity barriers and safety.   Exercise Prescription Goal: Initial exercise prescription builds to 30-45 minutes a day of aerobic activity, 2-3 days per week.  Home exercise guidelines will be given to patient during program as part of exercise prescription that the participant will acknowledge.  Education: Aerobic Exercise: - Group verbal and visual presentation on the components of exercise prescription. Introduces F.I.T.T principle from ACSM for exercise prescriptions.  Reviews F.I.T.T. principles of aerobic exercise including progression. Written material given at graduation.   Education: Resistance Exercise: - Group verbal and visual presentation on the components of exercise prescription. Introduces F.I.T.T principle from ACSM for exercise prescriptions  Reviews F.I.T.T. principles of resistance exercise including  progression. Written material given at graduation.    Education: Exercise & Equipment Safety: - Individual verbal instruction and demonstration of equipment use and safety with use of the equipment. Flowsheet Row Pulmonary Rehab from 05/12/2022 in [facility] Cardiac and Pulmonary Rehab  Date 04/26/22  Educator [facility]  Instruction Review Code 1- Verbalizes Understanding       Education: Exercise Physiology & General Exercise Guidelines: - Group verbal and written instruction with models to review the exercise physiology of the cardiovascular system and associated  critical values. Provides general exercise guidelines with specific guidelines to those with heart or lung disease.    Education: Flexibility, Balance, Mind/Body Relaxation: - Group verbal and visual presentation with interactive activity on the components of exercise prescription. Introduces F.I.T.T principle from ACSM for exercise prescriptions. Reviews F.I.T.T. principles of flexibility and balance exercise training including progression. Also discusses the mind body connection.  Reviews various relaxation techniques to help reduce and manage stress (i.e. Deep breathing, progressive muscle relaxation, and visualization). Balance handout provided to take home. Written material given at graduation.   Activity Barriers & Risk Stratification:  Activity Barriers & Cardiac Risk Stratification - 04/27/22 1438       Activity Barriers & Cardiac Risk Stratification   Activity Barriers Shortness of Breath;Left Knee Replacement;Other (comment);Back Problems    Comments Neuropathy in feet, Hip Pain             6 Minute Walk:  6 Minute Walk     Row Name 04/27/22 1433         6 Minute Walk   Phase Initial     Distance 800 feet     Walk Time 6 minutes     # of Rest Breaks 0     MPH 1.52     METS 2.15     RPE 13     Perceived Dyspnea  2     VO2 Peak 7.52     Symptoms Yes (comment)     Comments SOB, Hip Pain 4/10     Resting HR  66 bpm     Resting BP 116/62     Resting Oxygen Saturation  98 %     Exercise Oxygen Saturation  during 6 min walk 96 %     Max Ex. HR 96 bpm     Max Ex. BP 124/66     2 Minute Post BP 104/64       Interval HR   1 Minute HR 96     2 Minute HR 96     3 Minute HR 96     4 Minute HR 94     5 Minute HR 94     6 Minute HR 90     2 Minute Post HR 69     Interval Heart Rate? Yes       Interval Oxygen   Interval Oxygen? Yes     Baseline Oxygen Saturation % 98 %     1 Minute Oxygen Saturation % 98 %     1 Minute Liters of Oxygen 0 L  RA     2 Minute Oxygen Saturation % 97 %     2 Minute Liters of Oxygen 0 L  RA     3 Minute Oxygen Saturation % 96 %     3 Minute Liters of Oxygen 0 L  RA     4 Minute Oxygen Saturation % 98 %     4 Minute Liters of Oxygen 0 L  RA     5 Minute Oxygen Saturation % 99 %     5 Minute Liters of Oxygen 0 L  RA     6 Minute Oxygen Saturation % 98 %     6 Minute Liters of Oxygen 0 L  RA     2 Minute Post Oxygen Saturation % 99 %     2 Minute Post Liters of Oxygen 0 L  RA  Oxygen Initial Assessment:  Oxygen Initial Assessment - 04/26/22 1112       Home Oxygen   Home Oxygen Device None    Sleep Oxygen Prescription None    Home Exercise Oxygen Prescription None    Home Resting Oxygen Prescription None      Initial 6 min Walk   Oxygen Used None      Program Oxygen Prescription   Program Oxygen Prescription None      Intervention   Short Term Goals To learn and demonstrate proper pursed lip breathing techniques or other breathing techniques. ;To learn and understand importance of monitoring SPO2 with pulse oximeter and demonstrate accurate use of the pulse oximeter.;To learn and understand importance of maintaining oxygen saturations>88%;To learn and exhibit compliance with exercise, home and travel O2 prescription    Long  Term Goals Exhibits compliance with exercise, home  and travel O2 prescription;Maintenance of O2  saturations>88%;Demonstrates proper use of MDI's;Exhibits proper breathing techniques, such as pursed lip breathing or other method taught during program session;Verbalizes importance of monitoring SPO2 with pulse oximeter and return demonstration             Oxygen Re-Evaluation:  Oxygen Re-Evaluation     Row Name 05/03/22 1102 05/19/22 1127 05/28/22 1103         Program Oxygen Prescription   Program Oxygen Prescription -- None None       Home Oxygen   Home Oxygen Device -- None None     Sleep Oxygen Prescription -- None None     Home Exercise Oxygen Prescription -- None None     Home Resting Oxygen Prescription -- None None       Goals/Expected Outcomes   Short Term Goals -- To learn and understand importance of maintaining oxygen saturations>88%;To learn and understand importance of monitoring SPO2 with pulse oximeter and demonstrate accurate use of the pulse oximeter. To learn and demonstrate proper pursed lip breathing techniques or other breathing techniques.      Long  Term Goals -- Verbalizes importance of monitoring SPO2 with pulse oximeter and return demonstration;Maintenance of O2 saturations>88% Exhibits proper breathing techniques, such as pursed lip breathing or other method taught during program session     Comments Reviewed PLB technique with pt.  Talked about how it works and it's importance in maintaining their exercise saturations. Lynden AngCathy has a pulse oximeter to check her oxygen saturation at home. Informed and explained why it is important to have one. Reviewed that oxygen saturations should be 88 percent and above. Patient verbalizes understanding Informed patient how to perform the Pursed Lipped breathing technique. Told patient to Inhale through the nose and out the mouth with pursed lips to keep their airways open, help oxygenate them better, practice when at rest or doing strenuous activity. Patient Verbalizes understanding of technique and will work on and be  reiterated during [facility].     Goals/Expected Outcomes Short: Become more profiecient at using PLB. Long: Become independent at using PLB. Short: monitor oxygen at home with exertion. Long: maintain oxygen saturations above 88 percent independently. Short: use PLB with exertion. Long: use PLB on exertion proficiently and independently.              Oxygen Discharge (Final Oxygen Re-Evaluation):  Oxygen Re-Evaluation - 05/28/22 1103       Program Oxygen Prescription   Program Oxygen Prescription None      Home Oxygen   Home Oxygen Device None    Sleep Oxygen Prescription None  Home Exercise Oxygen Prescription None    Home Resting Oxygen Prescription None      Goals/Expected Outcomes   Short Term Goals To learn and demonstrate proper pursed lip breathing techniques or other breathing techniques.     Long  Term Goals Exhibits proper breathing techniques, such as pursed lip breathing or other method taught during program session    Comments Informed patient how to perform the Pursed Lipped breathing technique. Told patient to Inhale through the nose and out the mouth with pursed lips to keep their airways open, help oxygenate them better, practice when at rest or doing strenuous activity. Patient Verbalizes understanding of technique and will work on and be reiterated during [place].    Goals/Expected Outcomes Short: use PLB with exertion. Long: use PLB on exertion proficiently and independently.             Initial Exercise Prescription:  Initial Exercise Prescription - 04/27/22 1400       Date of Initial Exercise RX and Referring Provider   Date 04/27/22    Referring Provider Dr Ottie Glazier      Oxygen   Maintain Oxygen Saturation 88% or higher      Recumbant Bike   Level 1    RPM 50    Watts 15    Minutes 15    METs 2.15      NuStep   Level 2    SPM 80    Minutes 15    METs 2.15      REL-XR   Level 1    Watts 15    Speed 50    Minutes 15    METs  2.15      Track   Laps 20    Minutes 15    METs 2.09      Prescription Details   Frequency (times per week) 3    Duration Progress to 30 minutes of continuous aerobic without signs/symptoms of physical distress      Intensity   THRR 40-80% of Max Heartrate 102-138    Ratings of Perceived Exertion 11-13    Perceived Dyspnea 0-4      Progression   Progression Continue to progress workloads to maintain intensity without signs/symptoms of physical distress.      Resistance Training   Training Prescription Yes    Weight 2 lb    Reps 10-15             Perform Capillary Blood Glucose checks as needed.  Exercise Prescription Changes:   Exercise Prescription Changes     Row Name 04/27/22 1400 05/13/22 0900 05/24/22 1400 06/07/22 1400       Response to Exercise   Blood Pressure (Admit) 116/62 112/64 120/64 124/62    Blood Pressure (Exercise) 124/66 138/72 128/68 140/70    Blood Pressure (Exit) 104/64 126/62 124/62 112/64    Heart Rate (Admit) 66 bpm 68 bpm 71 bpm 77 bpm    Heart Rate (Exercise) 96 bpm 85 bpm 106 bpm 106 bpm    Heart Rate (Exit) 69 bpm 77 bpm 86 bpm 87 bpm    Oxygen Saturation (Admit) 98 % 98 % 96 % 98 %    Oxygen Saturation (Exercise) 96 % 98 % 93 % 96 %    Oxygen Saturation (Exit) 99 % 97 % 98 % 96 %    Rating of Perceived Exertion (Exercise) 13 15 13 14     Perceived Dyspnea (Exercise) 2 2 1 2     Symptoms  SOB Hip Pain SOB SOB SOB    Comments 6MWT Results 2nd full day of exercise -- --    Duration -- Progress to 30 minutes of  aerobic without signs/symptoms of physical distress Continue with 30 min of aerobic exercise without signs/symptoms of physical distress. Continue with 30 min of aerobic exercise without signs/symptoms of physical distress.    Intensity -- THRR unchanged THRR unchanged THRR unchanged      Progression   Progression -- Continue to progress workloads to maintain intensity without signs/symptoms of physical distress. Continue to  progress workloads to maintain intensity without signs/symptoms of physical distress. Continue to progress workloads to maintain intensity without signs/symptoms of physical distress.    Average METs -- 2.53 2.86 2.98      Resistance Training   Training Prescription -- Yes Yes Yes    Weight -- 2 lb 2 lb 2 lb    Reps -- 10-15 10-15 10-15      Interval Training   Interval Training -- No No No      Recumbant Bike   Level -- 1 2 2     Watts -- -- -- 15    Minutes -- 15 15 15     METs -- 2.1 3.1 2.63      NuStep   Level -- 2 4 --    Minutes -- -- 15 --    METs -- 2.3 3 --      REL-XR   Level -- 1 1 4     Minutes -- 15 15 15     METs -- 3.2 2.7 3.5      T5 Nustep   Level -- -- -- 2    SPM -- -- -- 80    Minutes -- -- -- 15    METs -- -- -- 2.1      Oxygen   Maintain Oxygen Saturation -- 88% or higher 88% or higher 88% or higher             Exercise Comments:   Exercise Comments     Row Name 05/03/22 1100           Exercise Comments First full day of exercise!  Patient was oriented to gym and equipment including functions, settings, policies, and procedures.  Patient's individual exercise prescription and treatment plan were reviewed.  All starting workloads were established based on the results of the 6 minute walk test done at initial orientation visit.  The plan for exercise progression was also introduced and progression will be customized based on patient's performance and goals.                Exercise Goals and Review:   Exercise Goals     Row Name 04/27/22 1449             Exercise Goals   Increase Physical Activity Yes       Intervention Provide advice, education, support and counseling about physical activity/exercise needs.;Develop an individualized exercise prescription for aerobic and resistive training based on initial evaluation findings, risk stratification, comorbidities and participant's personal goals.       Expected Outcomes Short Term:  Attend rehab on a regular basis to increase amount of physical activity.;Long Term: Add in home exercise to make exercise part of routine and to increase amount of physical activity.;Long Term: Exercising regularly at least 3-5 days a week.       Increase Strength and Stamina Yes       Intervention Provide advice, education, support  and counseling about physical activity/exercise needs.;Develop an individualized exercise prescription for aerobic and resistive training based on initial evaluation findings, risk stratification, comorbidities and participant's personal goals.       Expected Outcomes Short Term: Perform resistance training exercises routinely during rehab and add in resistance training at home;Short Term: Increase workloads from initial exercise prescription for resistance, speed, and METs.;Long Term: Improve cardiorespiratory fitness, muscular endurance and strength as measured by increased METs and functional capacity (6MWT)       Able to understand and use rate of perceived exertion (RPE) scale Yes       Intervention Provide education and explanation on how to use RPE scale       Expected Outcomes Short Term: Able to use RPE daily in rehab to express subjective intensity level;Long Term:  Able to use RPE to guide intensity level when exercising independently       Able to understand and use Dyspnea scale Yes       Intervention Provide education and explanation on how to use Dyspnea scale       Expected Outcomes Long Term: Able to use Dyspnea scale to guide intensity level when exercising independently;Short Term: Able to use Dyspnea scale daily in rehab to express subjective sense of shortness of breath during exertion       Knowledge and understanding of Target Heart Rate Range (THRR) Yes       Intervention Provide education and explanation of THRR including how the numbers were predicted and where they are located for reference       Expected Outcomes Short Term: Able to state/look up  THRR;Long Term: Able to use THRR to govern intensity when exercising independently;Short Term: Able to use daily as guideline for intensity in rehab       Able to check pulse independently Yes       Intervention Provide education and demonstration on how to check pulse in carotid and radial arteries.;Review the importance of being able to check your own pulse for safety during independent exercise       Expected Outcomes Short Term: Able to explain why pulse checking is important during independent exercise;Long Term: Able to check pulse independently and accurately       Understanding of Exercise Prescription Yes       Intervention Provide education, explanation, and written materials on patient's individual exercise prescription       Expected Outcomes Short Term: Able to explain program exercise prescription;Long Term: Able to explain home exercise prescription to exercise independently                Exercise Goals Re-Evaluation :  Exercise Goals Re-Evaluation     Row Name 05/03/22 1100 05/13/22 0915 05/24/22 1432 06/07/22 1437       Exercise Goal Re-Evaluation   Exercise Goals Review Able to understand and use rate of perceived exertion (RPE) scale;Able to understand and use Dyspnea scale;Knowledge and understanding of Target Heart Rate Range (THRR);Understanding of Exercise Prescription Increase Physical Activity;Understanding of Exercise Prescription;Increase Strength and Stamina Increase Physical Activity;Understanding of Exercise Prescription;Increase Strength and Stamina Increase Physical Activity;Understanding of Exercise Prescription;Increase Strength and Stamina    Comments Reviewed RPE scale, THR and program prescription with pt today.  Pt voiced understanding and was given a copy of goals to take home. Davesha has completed her first two full days of exercise thus far.  She has done well with initial workloads.  We will conintue to monitor her progress. Stephaniemarie is doing well  in  rehab. She recently improved her overall average MET level to 2.86 METs. She also was able to improve to level 2 on the recumbent bike and level 4 on the T4. She has not done any walking on the track yet, but will encourage her to start. We will continue to monitor her progress. Mccoy continues to do well in rehab. She increased to level 4 on the XR and tried out the T5 Nustep and was able to exercise at a level 2. She has been hitting her THR each session. She could benefit from increasing to 3 lb handweights. Will continue to monitor.    Expected Outcomes Short: Use RPE daily to regulate intensity. Long: Follow program prescription in THR. Short: Attend rehab regularly Long: Continue to improve stamina Short: Begin walking on the track. Long: Continue to improve strength and stamina. Short: Increase handweights to 3 lb Long: Continue to increase overall stamina and MET level             Discharge Exercise Prescription (Final Exercise Prescription Changes):  Exercise Prescription Changes - 06/07/22 1400       Response to Exercise   Blood Pressure (Admit) 124/62    Blood Pressure (Exercise) 140/70    Blood Pressure (Exit) 112/64    Heart Rate (Admit) 77 bpm    Heart Rate (Exercise) 106 bpm    Heart Rate (Exit) 87 bpm    Oxygen Saturation (Admit) 98 %    Oxygen Saturation (Exercise) 96 %    Oxygen Saturation (Exit) 96 %    Rating of Perceived Exertion (Exercise) 14    Perceived Dyspnea (Exercise) 2    Symptoms SOB    Duration Continue with 30 min of aerobic exercise without signs/symptoms of physical distress.    Intensity THRR unchanged      Progression   Progression Continue to progress workloads to maintain intensity without signs/symptoms of physical distress.    Average METs 2.98      Resistance Training   Training Prescription Yes    Weight 2 lb    Reps 10-15      Interval Training   Interval Training No      Recumbant Bike   Level 2    Watts 15    Minutes 15    METs  2.63      REL-XR   Level 4    Minutes 15    METs 3.5      T5 Nustep   Level 2    SPM 80    Minutes 15    METs 2.1      Oxygen   Maintain Oxygen Saturation 88% or higher             Nutrition:  Target Goals: Understanding of nutrition guidelines, daily intake of sodium 1500mg , cholesterol 200mg , calories 30% from fat and 7% or less from saturated fats, daily to have 5 or more servings of fruits and vegetables.  Education: All About Nutrition: -Group instruction provided by verbal, written material, interactive activities, discussions, models, and posters to present general guidelines for heart healthy nutrition including fat, fiber, MyPlate, the role of sodium in heart healthy nutrition, utilization of the nutrition label, and utilization of this knowledge for meal planning. Follow up email sent as well. Written material given at graduation.   Biometrics:  Pre Biometrics - 04/27/22 1449       Pre Biometrics   Height 5\' 4"  (1.626 m)    Weight 166 lb 11.2 oz (  75.6 kg)    Waist Circumference 34.5 inches    Hip Circumference 40.5 inches    Waist to Hip Ratio 0.85 %    BMI (Calculated) 28.6    Single Leg Stand 2.4 seconds   Left Leg             Nutrition Therapy Plan and Nutrition Goals:  Nutrition Therapy & Goals - 04/27/22 1028       Nutrition Therapy   Diet Heart healthy, low Na, pulmonary MNT    Protein (specify units) 90-95g    Fiber 25 grams    Whole Grain Foods 3 servings    Saturated Fats 12 max. grams    Fruits and Vegetables 8 servings/day    Sodium 1.5 grams      Personal Nutrition Goals   Nutrition Goal ST: practice MyPlate guidelines, include 2-3 snacks with fiber, protein, and heart healthy fat  LT: follow MyPlate guidelines, limit Na <1.5g/day, eat a variety of fruits and vegetables    Comments 67 y.o. F admitted to pulmonary rehab s/p dyspnea. PMHx includes HTN, migraine, CKD stg 3a, Sjogrens syndrome. Relevant medication includes dexilant,  folic acid, pilocarpine. Winter reports having abdominal surgery to rotate her bowel back to normal position. She is having pain from a new hernia due to surgery, but the MD reports She reports losing 12 lbs in the last 7 weeks due to loss of appetite. She reports having irregular eating habits now  such as snacking or rice pudding, oatmeal, 1 meal per day includes pork, chicken, starchy vegetables and some non-starchy vegetables (she doesn't like most leafy greens or cruciferous vegetables). She reports going out to eat more now as she has not been cooking much at home since the surgery. Discussed sodium concerns with eating out and encouraged to get back into the habit of eating more foods at home. Mattelyn reports having a hard time recalling foods at this time. Drinks: water. Discussed general healthy eating and pulmnary MNT. discussed including snacks during the day that include protein, fiber, and healthy fats to boost her nutrition and to ensure she is meeting her energy and protein needs to avoid losing msucle tissue. Some snacks may inlcude things like fruit and peanut butter or greek yogurt.      Intervention Plan   Intervention Prescribe, educate and counsel regarding individualized specific dietary modifications aiming towards targeted core components such as weight, hypertension, lipid management, diabetes, heart failure and other comorbidities.;Nutrition handout(s) given to patient.    Expected Outcomes Short Term Goal: A plan has been developed with personal nutrition goals set during dietitian appointment.;Short Term Goal: Understand basic principles of dietary content, such as calories, fat, sodium, cholesterol and nutrients.;Long Term Goal: Adherence to prescribed nutrition plan.             Nutrition Assessments:  MEDIFICTS Score Key: ?70 Need to make dietary changes  40-70 Heart Healthy Diet ? 40 Therapeutic Level Cholesterol Diet  Flowsheet Row Pulmonary Rehab from 04/27/2022 in  [facility] Cardiac and Pulmonary Rehab  Picture Your Plate Total Score on Admission 62      Picture Your Plate Scores: <19 Unhealthy dietary pattern with much room for improvement. 41-50 Dietary pattern unlikely to meet recommendations for good health and room for improvement. 51-60 More healthful dietary pattern, with some room for improvement.  >60 Healthy dietary pattern, although there may be some specific behaviors that could be improved.   Nutrition Goals Re-Evaluation:  Nutrition Goals Re-Evaluation     Row  Name 05/19/22 1128 05/28/22 1105           Goals   Current Weight -- 165 lb (74.8 kg)      Nutrition Goal -- Eat more fruits and Vegetables      Comment Patient was informed on why it is important to maintain a balanced diet when dealing with Respiratory issues. Explained that it takes a lot of energy to breath and when they are short of breath often they will need to have a good diet to help keep up with the calories they are expending for breathing. Shuron wants to try to eat healthier. There are some vegetables that she does not like. She likes fruit, red peppers and leans protiens. Her husband is a diabetic and she wants to eat healthy for him to.      Expected Outcome Short: Choose and plan snacks accordingly to patients caloric intake to improve breathing. Long: Maintain a diet independently that meets their caloric intake to aid in daily shortness of breath. Short: eat more vegetables. Long: adhere to a diet that pertains to her.               Nutrition Goals Discharge (Final Nutrition Goals Re-Evaluation):  Nutrition Goals Re-Evaluation - 05/28/22 1105       Goals   Current Weight 165 lb (74.8 kg)    Nutrition Goal Eat more fruits and Vegetables    Comment Trichelle wants to try to eat healthier. There are some vegetables that she does not like. She likes fruit, red peppers and leans protiens. Her husband is a diabetic and she wants to eat healthy for him to.    Expected  Outcome Short: eat more vegetables. Long: adhere to a diet that pertains to her.             Psychosocial: Target Goals: Acknowledge presence or absence of significant depression and/or stress, maximize coping skills, provide positive support system. Participant is able to verbalize types and ability to use techniques and skills needed for reducing stress and depression.   Education: Stress, Anxiety, and Depression - Group verbal and visual presentation to define topics covered.  Reviews how body is impacted by stress, anxiety, and depression.  Also discusses healthy ways to reduce stress and to treat/manage anxiety and depression.  Written material given at graduation.   Education: Sleep Hygiene -Provides group verbal and written instruction about how sleep can affect your health.  Define sleep hygiene, discuss sleep cycles and impact of sleep habits. Review good sleep hygiene tips.    Initial Review & Psychosocial Screening:  Initial Psych Review & Screening - 04/26/22 1114       Initial Review   Current issues with Current Sleep Concerns;Current Stress Concerns    Source of Stress Concerns Chronic Illness      Family Dynamics   Good Support System? Yes    Comments Mariana states that she does not sleep very well. She wakes up every couple hours or so. She can look to her family for support and has great support from the rest of her family. Eithel may have to go back for more abnominal surgery due to a hernia which creates some stress.      Barriers   Psychosocial barriers to participate in program The patient should benefit from training in stress management and relaxation.;There are no identifiable barriers or psychosocial needs.      Screening Interventions   Interventions Encouraged to exercise;To provide support and resources with identified  psychosocial needs;Provide feedback about the scores to participant    Expected Outcomes Short Term goal: Utilizing psychosocial  counselor, staff and physician to assist with identification of specific Stressors or current issues interfering with healing process. Setting desired goal for each stressor or current issue identified.;Long Term Goal: Stressors or current issues are controlled or eliminated.;Short Term goal: Identification and review with participant of any Quality of Life or Depression concerns found by scoring the questionnaire.;Long Term goal: The participant improves quality of Life and PHQ9 Scores as seen by post scores and/or verbalization of changes             Quality of Life Scores:  Scores of 19 and below usually indicate a poorer quality of life in these areas.  A difference of  2-3 points is a clinically meaningful difference.  A difference of 2-3 points in the total score of the Quality of Life Index has been associated with significant improvement in overall quality of life, self-image, physical symptoms, and general health in studies assessing change in quality of life.  PHQ-9: Review Flowsheet       05/28/2022 04/27/2022  Depression screen PHQ 2/9  Decreased Interest 0 1  Down, Depressed, Hopeless 0 1  PHQ - 2 Score 0 2  Altered sleeping 3 3  Tired, decreased energy 3 3  Change in appetite 2 3  Feeling bad or failure about yourself  0 1  Trouble concentrating 0 0  Moving slowly or fidgety/restless 0 0  Suicidal thoughts 0 0  PHQ-9 Score 8 12  Difficult doing work/chores Not difficult at all Very difficult   Interpretation of Total Score  Total Score Depression Severity:  1-4 = Minimal depression, 5-9 = Mild depression, 10-14 = Moderate depression, 15-19 = Moderately severe depression, 20-27 = Severe depression   Psychosocial Evaluation and Intervention:  Psychosocial Evaluation - 04/26/22 1116       Psychosocial Evaluation & Interventions   Interventions Encouraged to exercise with the program and follow exercise prescription;Relaxation education;Stress management education     Comments Lynden AngCathy states that she does not sleep very well. She wakes up every couple hours or so. She can look to her family for support and has great support from the rest of her family.    Expected Outcomes Short: Start [facility] to help with mood. Long: Maintain a healthy mental state.    Continue Psychosocial Services  Follow up required by staff             Psychosocial Re-Evaluation:  Psychosocial Re-Evaluation     Row Name 05/28/22 1111             Psychosocial Re-Evaluation   Current issues with None Identified       Comments Reviewed patient health questionnaire (PHQ-9) with patient for follow up. Previously, patients score indicated signs/symptoms of depression.  Reviewed to see if patient is improving symptom wise while in program.  Score improved and patient states that it is because she has been trying to improve her health more.       Expected Outcomes Short: Continue to attend [facility] regularly for regular exercise and social engagement. Long: Continue to improve symptoms and manage a positive mental state.       Interventions Encouraged to attend Pulmonary Rehabilitation for the exercise       Continue Psychosocial Services  Follow up required by staff                Psychosocial Discharge (Final Psychosocial Re-Evaluation):  Psychosocial Re-Evaluation - 05/28/22 1111       Psychosocial Re-Evaluation   Current issues with None Identified    Comments Reviewed patient health questionnaire (PHQ-9) with patient for follow up. Previously, patients score indicated signs/symptoms of depression.  Reviewed to see if patient is improving symptom wise while in program.  Score improved and patient states that it is because she has been trying to improve her health more.    Expected Outcomes Short: Continue to attend [facility] regularly for regular exercise and social engagement. Long: Continue to improve symptoms and manage a positive mental state.     Interventions Encouraged to attend Pulmonary Rehabilitation for the exercise    Continue Psychosocial Services  Follow up required by staff             Education: Education Goals: Education classes will be provided on a weekly basis, covering required topics. Participant will state understanding/return demonstration of topics presented.  Learning Barriers/Preferences:  Learning Barriers/Preferences - 04/26/22 1113       Learning Barriers/Preferences   Learning Barriers None    Learning Preferences None             General Pulmonary Education Topics:  Infection Prevention: - Provides verbal and written material to individual with discussion of infection control including proper hand washing and proper equipment cleaning during exercise session. Flowsheet Row Pulmonary Rehab from 05/12/2022 in [facility] Cardiac and Pulmonary Rehab  Date 04/26/22  Educator [facility]  Instruction Review Code 1- Verbalizes Understanding       Falls Prevention: - Provides verbal and written material to individual with discussion of falls prevention and safety. Flowsheet Row Pulmonary Rehab from 05/12/2022 in [facility] Cardiac and Pulmonary Rehab  Date 04/26/22  Educator [facility]  Instruction Review Code 1- Verbalizes Understanding       Chronic Lung Disease Review: - Group verbal instruction with posters, models, PowerPoint presentations and videos,  to review new updates, new respiratory medications, new advancements in procedures and treatments. Providing information on websites and "800" numbers for continued self-education. Includes information about supplement oxygen, available portable oxygen systems, continuous and intermittent flow rates, oxygen safety, concentrators, and Medicare reimbursement for oxygen. Explanation of Pulmonary Drugs, including class, frequency, complications, importance of spacers, rinsing mouth after steroid MDI's, and proper cleaning methods for nebulizers. Review of basic lung  anatomy and physiology related to function, structure, and complications of lung disease. Review of risk factors. Discussion about methods for diagnosing sleep apnea and types of masks and machines for OSA. Includes a review of the use of types of environmental controls: home humidity, furnaces, filters, dust mite/pet prevention, HEPA vacuums. Discussion about weather changes, air quality and the benefits of nasal washing. Instruction on Warning signs, infection symptoms, calling MD promptly, preventive modes, and value of vaccinations. Review of effective airway clearance, coughing and/or vibration techniques. Emphasizing that all should Create an Action Plan. Written material given at graduation. Flowsheet Row Pulmonary Rehab from 05/12/2022 in [facility] Cardiac and Pulmonary Rehab  Education need identified 04/27/22  Date 05/12/22  Educator [facility]  Instruction Review Code 1- Verbalizes Understanding       AED/CPR: - Group verbal and written instruction with the use of models to demonstrate the basic use of the AED with the basic ABC's of resuscitation.    Anatomy and Cardiac Procedures: - Group verbal and visual presentation and models provide information about basic cardiac anatomy and function. Reviews the testing methods done to diagnose heart disease and the outcomes of the test results.  Describes the treatment choices: Medical Management, Angioplasty, or Coronary Bypass Surgery for treating various heart conditions including Myocardial Infarction, Angina, Valve Disease, and Cardiac Arrhythmias.  Written material given at graduation.   Medication Safety: - Group verbal and visual instruction to review commonly prescribed medications for heart and lung disease. Reviews the medication, class of the drug, and side effects. Includes the steps to properly store meds and maintain the prescription regimen.  Written material given at graduation.   Other: -Provides group and verbal instruction on  various topics (see comments)   Knowledge Questionnaire Score:  Knowledge Questionnaire Score - 04/27/22 1422       Knowledge Questionnaire Score   Pre Score 16/18              Core Components/Risk Factors/Patient Goals at Admission:  Personal Goals and Risk Factors at Admission - 04/27/22 1424       Core Components/Risk Factors/Patient Goals on Admission    Weight Management Yes;Weight Loss    Intervention Weight Management: Develop a combined nutrition and exercise program designed to reach desired caloric intake, while maintaining appropriate intake of nutrient and fiber, sodium and fats, and appropriate energy expenditure required for the weight goal.;Weight Management: Provide education and appropriate resources to help participant work on and attain dietary goals.;Weight Management/Obesity: Establish reasonable short term and long term weight goals.    Admit Weight 166 lb 11.2 oz (75.6 kg)    Goal Weight: Short Term 160 lb (72.6 kg)    Goal Weight: Long Term 155 lb (70.3 kg)    Expected Outcomes Short Term: Continue to assess and modify interventions until short term weight is achieved;Long Term: Adherence to nutrition and physical activity/exercise program aimed toward attainment of established weight goal;Weight Loss: Understanding of general recommendations for a balanced deficit meal plan, which promotes 1-2 lb weight loss per week and includes a negative energy balance of [phone] kcal/d;Understanding recommendations for meals to include 15-35% energy as protein, 25-35% energy from fat, 35-60% energy from carbohydrates, less than 200mg  of dietary cholesterol, 20-35 gm of total fiber daily;Understanding of distribution of calorie intake throughout the day with the consumption of 4-5 meals/snacks    Improve shortness of breath with ADL's Yes    Intervention Provide education, individualized exercise plan and daily activity instruction to help decrease symptoms of SOB with  activities of daily living.    Expected Outcomes Short Term: Improve cardiorespiratory fitness to achieve a reduction of symptoms when performing ADLs;Long Term: Be able to perform more ADLs without symptoms or delay the onset of symptoms    Hypertension Yes    Intervention Provide education on lifestyle modifcations including regular physical activity/exercise, weight management, moderate sodium restriction and increased consumption of fresh fruit, vegetables, and low fat dairy, alcohol moderation, and smoking cessation.;Monitor prescription use compliance.    Expected Outcomes Short Term: Continued assessment and intervention until BP is < 140/60mm HG in hypertensive participants. < 130/43mm HG in hypertensive participants with diabetes, heart failure or chronic kidney disease.;Long Term: Maintenance of blood pressure at goal levels.             Education:Diabetes - Individual verbal and written instruction to review signs/symptoms of diabetes, desired ranges of glucose level fasting, after meals and with exercise. Acknowledge that pre and post exercise glucose checks will be done for 3 sessions at entry of program.   Know Your Numbers and Heart Failure: - Group verbal and visual instruction to discuss disease risk factors for cardiac and pulmonary disease  and treatment options.  Reviews associated critical values for Overweight/Obesity, Hypertension, Cholesterol, and Diabetes.  Discusses basics of heart failure: signs/symptoms and treatments.  Introduces Heart Failure Zone chart for action plan for heart failure.  Written material given at graduation.   Core Components/Risk Factors/Patient Goals Review:   Goals and Risk Factor Review     Row Name 05/19/22 1126 05/28/22 1108           Core Components/Risk Factors/Patient Goals Review   Personal Goals Review Improve shortness of breath with ADL's Weight Management/Obesity      Review Spoke to patient about their shortness of breath and  what they can do to improve. Patient has been informed of breathing techniques when starting the program. Patient is informed to tell staff if they have had any med changes and that certain meds they are taking or not taking can be causing shortness of breath. Dhruthi has lost 15 pounds after her abdominal surgery from a bowel blockage. She is trying to eat healthier and lose more weight. Her weight goal is 150 pounds.      Expected Outcomes Short: Attend [facility] regularly to improve shortness of breath with ADL's. Long: maintain independence with ADL's Short: lose a few pounds in the next few weeks. Long; reach weight of 150 pounds.               Core Components/Risk Factors/Patient Goals at Discharge (Final Review):   Goals and Risk Factor Review - 05/28/22 1108       Core Components/Risk Factors/Patient Goals Review   Personal Goals Review Weight Management/Obesity    Review Happiness has lost 15 pounds after her abdominal surgery from a bowel blockage. She is trying to eat healthier and lose more weight. Her weight goal is 150 pounds.    Expected Outcomes Short: lose a few pounds in the next few weeks. Long; reach weight of 150 pounds.             ITP Comments:  ITP Comments     Row Name 04/26/22 1111 04/27/22 1421 05/03/22 1100 05/12/22 0948 06/09/22 0859   ITP Comments Virtual Visit completed. Patient informed on EP and RD appointment and 6 Minute walk test. Patient also informed of patient health questionnaires on My Chart. Patient Verbalizes understanding. Visit diagnosis can be found in [facility] 04/12/2022. Completed 6MWT and gym orientation. Initial ITP created and sent for review to Dr. Ottie Glazier, Medical Director. First full day of exercise!  Patient was oriented to gym and equipment including functions, settings, policies, and procedures.  Patient's individual exercise prescription and treatment plan were reviewed.  All starting workloads were established based on the results of  the 6 minute walk test done at initial orientation visit.  The plan for exercise progression was also introduced and progression will be customized based on patient's performance and goals. 30 Day review completed. Medical Director ITP review done, changes made as directed, and signed approval by Medical Director.    new to program 30 Day review completed. Medical Director ITP review done, changes made as directed, and signed approval by Medical Director.            Comments:

## 2022-06-09 NOTE — Progress Notes (Signed)
Daily Session Note  Patient Details  Name: Amy Huynh MRN: [id number] Date of Birth: [birth year] Referring Provider:   Flowsheet Row Pulmonary Rehab from 04/27/2022 in [facility] Cardiac and Pulmonary Rehab  Referring Provider Dr Ottie Glazier       Encounter Date: 06/09/2022  Check In:  Session Check In - 06/09/22 1110       Check-In   Supervising physician immediately available to respond to emergencies See telemetry face sheet for immediately available ER MD    Location [facility]-Cardiac & Pulmonary Rehab    Staff Present Darlyne Russian, RN, ADN;Meredith Sherryll Burger, RN BSN;Joseph Hood, RCP,RRT,BSRT;Noah Tickle, BS, Exercise Physiologist    Virtual Visit No    Medication changes reported     No    Fall or balance concerns reported    No    Warm-up and Cool-down Performed on first and last piece of equipment    Resistance Training Performed Yes    VAD Patient? No    PAD/SET Patient? No      Pain Assessment   Currently in Pain? No/denies                Social History   Tobacco Use  Smoking Status Never  Smokeless Tobacco Never    Goals Met:  Independence with exercise equipment Exercise tolerated well No report of concerns or symptoms today Strength training completed today  Goals Unmet:  Not Applicable  Comments: Pt able to follow exercise prescription today without complaint.  Will continue to monitor for progression.    Dr. Emily Filbert is Medical Director for [place].  Dr. Ottie Glazier is Medical Director for [facility] Pulmonary Rehabilitation.

## 2022-06-14 ENCOUNTER — Encounter: Payer: Medicare PPO | Admitting: *Deleted

## 2022-06-16 ENCOUNTER — Encounter: Payer: Medicare PPO | Admitting: *Deleted

## 2022-06-16 DIAGNOSIS — R0600 Dyspnea, unspecified: Secondary | ICD-10-CM | POA: Diagnosis not present

## 2022-06-16 NOTE — Progress Notes (Signed)
Daily Session Note  Patient Details  Name: Amy Huynh MRN: [id number] Date of Birth: 11/07/1955 Referring Provider:   Flowsheet Row Pulmonary Rehab from 04/27/2022 in [facility] Cardiac and Pulmonary Rehab  Referring Provider Dr Ottie Glazier       Encounter Date: 06/16/2022  Check In:  Session Check In - 06/16/22 1136       Check-In   Supervising physician immediately available to respond to emergencies See telemetry face sheet for immediately available ER MD    Location [facility]-Cardiac & Pulmonary Rehab    Staff Present Darlyne Russian, RN, ADN;Meredith Sherryll Burger, RN Abel Presto, MS, ACSM CEP, Exercise Physiologist;Noah Tickle, BS, Exercise Physiologist    Virtual Visit No    Medication changes reported     No    Fall or balance concerns reported    No    Warm-up and Cool-down Performed on first and last piece of equipment    Resistance Training Performed Yes    VAD Patient? No    PAD/SET Patient? No      Pain Assessment   Currently in Pain? No/denies                Social History   Tobacco Use  Smoking Status Never  Smokeless Tobacco Never    Goals Met:  Independence with exercise equipment Exercise tolerated well No report of concerns or symptoms today Strength training completed today  Goals Unmet:  Not Applicable  Comments: Pt able to follow exercise prescription today without complaint.  Will continue to monitor for progression.    Dr. Emily Filbert is Medical Director for [place].  Dr. Ottie Glazier is Medical Director for [facility] Pulmonary Rehabilitation.

## 2022-06-18 ENCOUNTER — Encounter: Payer: Medicare PPO | Attending: Pulmonary Disease | Admitting: *Deleted

## 2022-06-18 DIAGNOSIS — R0600 Dyspnea, unspecified: Secondary | ICD-10-CM | POA: Diagnosis present

## 2022-06-18 NOTE — Progress Notes (Signed)
Daily Session Note  Patient Details  Name: Amy Huynh MRN: [id number] Date of Birth: January 24, 1956 Referring Provider:   Flowsheet Row Pulmonary Rehab from 04/27/2022 in [facility]  Referring Provider Dr Ottie Glazier       Encounter Date: 06/18/2022  Check In:  Session Check In - 06/18/22 1150       Check-In   Supervising physician immediately available to respond to emergencies See telemetry face sheet for immediately available ER MD    Location [facility]-Cardiac & Pulmonary Rehab    Staff Present Heath Lark, RN, BSN, CCRP;Joseph [place], RCP,RRT,BSRT;Jessica Foreman, [place], [place], CCRP, CCET    Virtual Visit No    Medication changes reported     No    Fall or balance concerns reported    No    Warm-up and Cool-down Performed on first and last piece of equipment    Resistance Training Performed Yes    VAD Patient? No    PAD/SET Patient? No      Pain Assessment   Currently in Pain? No/denies                Social History   Tobacco Use  Smoking Status Never  Smokeless Tobacco Never    Goals Met:  Proper associated with RPD/PD & O2 Sat Independence with exercise equipment Exercise tolerated well No report of concerns or symptoms today  Goals Unmet:  Not Applicable  Comments: Pt able to follow exercise prescription today without complaint.  Will continue to monitor for progression.    Dr. Emily Filbert is Medical Director for [place].  Dr. Ottie Glazier is Medical Director for [facility] Pul[facility].

## 2022-06-21 ENCOUNTER — Encounter: Payer: Medicare PPO | Admitting: *Deleted

## 2022-06-28 ENCOUNTER — Encounter: Payer: Medicare PPO | Admitting: *Deleted

## 2022-06-30 ENCOUNTER — Encounter: Payer: Medicare PPO | Admitting: *Deleted

## 2022-06-30 DIAGNOSIS — R0600 Dyspnea, unspecified: Secondary | ICD-10-CM

## 2022-06-30 NOTE — Progress Notes (Signed)
Daily Session Note  Patient Details  Name: Amy Huynh MRN: [id number] Date of Birth: 04-04-1956 Referring Provider:   Flowsheet Row Pulmonary Rehab from 04/27/2022 in [facility] Re[facility]  Referring Provider Dr Ottie Glazier       Encounter Date: 06/30/2022  Check In:  Session Check In - 06/30/22 1150       Check-In   Supervising physician immediately available to respond to emergencies See telemetry face sheet for immediately available ER MD    Location [facility]-Cardiac [facility] Pulmonary Re[facility]    Staff Present Darlyne Russian, RN, ADN;Joseph Tessie Fass, RCP,RRT,BSRT;Noah Tickle, BS, Exercise Physiologist    Virtual Visit No    Medication changes reported     No    Fall or balance concerns reported    No    Warm-up and Cool-down Performed on first and last piece of equipment    Resistance Training Performed Yes    VAD Patient? No    PAD/SET Patient? No      Pain Assessment   Currently in Pain? No/denies                Social History   Tobacco Use  Smoking Status Never  Smokeless Tobacco Never    Goals Met:  Independence with exercise equipment Exercise tolerated well No report of concerns or symptoms today Strength training completed today  Goals Unmet:  Not Applicable  Comments: Pt able to follow exercise prescription today without complaint.  Will continue to monitor for progression.    Dr. Emily Filbert is Medical Director for [place].  Dr. Ottie Glazier is Medical Director for [facility] Pulmonary Rehabilitation.

## 2022-07-02 ENCOUNTER — Encounter: Payer: Medicare PPO | Admitting: *Deleted

## 2022-07-05 ENCOUNTER — Encounter: Payer: Medicare PPO | Admitting: *Deleted

## 2022-07-07 ENCOUNTER — Encounter: Payer: Self-pay | Admitting: *Deleted

## 2022-07-07 DIAGNOSIS — R0600 Dyspnea, unspecified: Secondary | ICD-10-CM

## 2022-07-07 NOTE — Progress Notes (Signed)
Pulmonary Individual Treatment Plan  Patient Details  Name: Shaquera Sandstedt MRN: [id number] Date of Birth: 66-Aug-1957 Referring Provider:   Flowsheet Row Pulmonary Rehab from 04/27/2022 in [facility] Cardiac and Pulmonary Rehab  Referring Provider Dr Ottie Glazier       Initial Encounter Date:  Flowsheet Row Pulmonary Rehab from 04/27/2022 in [facility] Cardiac and Pulmonary Rehab  Date 04/27/22       Visit Diagnosis: Dyspnea, unspecified type  Patient's Home Medications on Admission:  Current Outpatient Medications:    Apremilast (OTEZLA) 30 MG TABS, Take 30 mg by mouth 2 (two) times daily., Disp: , Rfl:    butalbital-acetaminophen-caffeine (FIORICET, ESGIC) 50-325-40 MG tablet, Take 1 tablet by mouth every 6 (six) hours as needed for headache., Disp: , Rfl:    cetirizine (ZYRTEC) 10 MG tablet, Take 10 mg by mouth daily., Disp: , Rfl:    clobetasol cream (TEMOVATE) 0.05 %, Apply topically., Disp: , Rfl:    cyanocobalamin 1000 MCG tablet, Take 1,000 mcg by mouth daily., Disp: , Rfl:    dexlansoprazole (DEXILANT) 60 MG capsule, TAKE 1 CAPSULE(60 MG) BY MOUTH DAILY, Disp: 90 capsule, Rfl: 0   Eptinezumab-jjmr (VYEPTI) 100 MG/ML injection, Inject 100 mg into the vein every 3 (three) months. Last Saturday, Disp: , Rfl:    fexofenadine (ALLEGRA) 180 MG tablet, Take 180 mg by mouth daily., Disp: , Rfl:    folic acid (FOLVITE) 1 MG tablet, Take 1 mg by mouth daily., Disp: , Rfl:    gabapentin (NEURONTIN) 300 MG capsule, Take 900 mg by mouth at bedtime., Disp: , Rfl:    hydrOXYzine (ATARAX/VISTARIL) 10 MG tablet, Take 10 mg by mouth every 8 (eight) hours as needed., Disp: , Rfl:    hyoscyamine (LEVSIN, ANASPAZ) 0.125 MG tablet, Take 0.125 mg by mouth every 4 (four) hours as needed., Disp: , Rfl:    methocarbamol (ROBAXIN) 500 MG tablet, Take 500 mg by mouth at bedtime. , Disp: , Rfl:    methotrexate (RHEUMATREX) 2.5 MG tablet, Take 20 mg by mouth once a week. Take 4 tablets (10 mg ) by  mouth in the morning and 4 tablets(10 mg by mouth in the evening on Saturdays (total= 20 mg), Disp: , Rfl:    ondansetron (ZOFRAN-ODT) 4 MG disintegrating tablet, Take 1 tablet (4 mg total) by mouth every 8 (eight) hours as needed for nausea or vomiting., Disp: 20 tablet, Rfl: 0   pilocarpine (SALAGEN) 5 MG tablet, Take 5 mg by mouth 3 (three) times daily., Disp: , Rfl:    promethazine (PHENERGAN) 25 MG tablet, Take 25 mg by mouth every 6 (six) hours as needed for nausea or vomiting., Disp: , Rfl:    rizatriptan (MAXALT) 10 MG tablet, Take 10 mg by mouth as needed for migraine. May repeat in 2 hours if needed, Disp: , Rfl:    rOPINIRole (REQUIP) 2 MG tablet, Take 2 mg by mouth at bedtime., Disp: , Rfl:    topiramate (TOPAMAX) 100 MG tablet, Take 200 mg by mouth daily., Disp: , Rfl:    valACYclovir (VALTREX) 1000 MG tablet, , Disp: , Rfl:   Past Medical History: Past Medical History:  Diagnosis Date   Arthritis    Family history of ovarian cancer 12/2016   MyRisk neg   Fibromyalgia    Migraine    Neuropathy    Placenta previa    Sjogren's disease ([place])     Tobacco Use: Social History   Tobacco Use  Smoking Status Never  Smokeless Tobacco  Never    Labs: Review Flowsheet        No data to display           Pulmonary Assessment Scores:  Pulmonary Assessment Scores     Row Name 04/27/22 1425         ADL UCSD   ADL Phase Entry     SOB Score total 57     Rest 0     Walk 3     Stairs 5     Bath 3     Dress 2     Shop 4       CAT Score   CAT Score 18       mMRC Score   mMRC Score 1              UCSD: Self-administered rating of dyspnea associated with activities of daily living (ADLs) 6-point scale (0 = "not at all" to 5 = "maximal or unable to do because of breathlessness")  Scoring Scores range from 0 to 120.  Minimally important difference is 5 units  CAT: CAT can identify the health impairment of COPD patients and is better correlated with disease  progression.  CAT has a scoring range of zero to 40. The CAT score is classified into four groups of low (less than 10), medium (10 - 20), high (21-30) and very high (31-40) based on the impact level of disease on health status. A CAT score over 10 suggests significant symptoms.  A worsening CAT score could be explained by an exacerbation, poor medication adherence, poor inhaler technique, or progression of COPD or comorbid conditions.  CAT MCID is 2 points  mMRC: mMRC (Modified Medical Research Council) Dyspnea Scale is used to assess the degree of baseline functional disability in patients of respiratory disease due to dyspnea. No minimal important difference is established. A decrease in score of 1 point or greater is considered a positive change.   Pulmonary Function Assessment:  Pulmonary Function Assessment - 04/26/22 1112       Breath   Shortness of Breath Yes;Limiting activity             Exercise Target Goals: Exercise Program Goal: Individual exercise prescription set using results from initial 6 min walk test and THRR while considering  patient's activity barriers and safety.   Exercise Prescription Goal: Initial exercise prescription builds to 30-45 minutes a day of aerobic activity, 2-3 days per week.  Home exercise guidelines will be given to patient during program as part of exercise prescription that the participant will acknowledge.  Education: Aerobic Exercise: - Group verbal and visual presentation on the components of exercise prescription. Introduces F.I.T.T principle from ACSM for exercise prescriptions.  Reviews F.I.T.T. principles of aerobic exercise including progression. Written material given at graduation.   Education: Resistance Exercise: - Group verbal and visual presentation on the components of exercise prescription. Introduces F.I.T.T principle from ACSM for exercise prescriptions  Reviews F.I.T.T. principles of resistance exercise including  progression. Written material given at graduation.    Education: Exercise & Equipment Safety: - Individual verbal instruction and demonstration of equipment use and safety with use of the equipment. Flowsheet Row Pulmonary Rehab from 06/16/2022 in [facility] Cardiac and Pulmonary Rehab  Date 04/26/22  Educator [facility]  Instruction Review Code 1- Verbalizes Understanding       Education: Exercise Physiology & General Exercise Guidelines: - Group verbal and written instruction with models to review the exercise physiology of the cardiovascular system and associated  critical values. Provides general exercise guidelines with specific guidelines to those with heart or lung disease.    Education: Flexibility, Balance, Mind/Body Relaxation: - Group verbal and visual presentation with interactive activity on the components of exercise prescription. Introduces F.I.T.T principle from ACSM for exercise prescriptions. Reviews F.I.T.T. principles of flexibility and balance exercise training including progression. Also discusses the mind body connection.  Reviews various relaxation techniques to help reduce and manage stress (i.e. Deep breathing, progressive muscle relaxation, and visualization). Balance handout provided to take home. Written material given at graduation. Flowsheet Row Pulmonary Rehab from 06/16/2022 in [facility] Cardiac and Pulmonary Rehab  Date 06/16/22  Educator [facility]  Instruction Review Code 1- Verbalizes Understanding       Activity Barriers & Risk Stratification:  Activity Barriers & Cardiac Risk Stratification - 04/27/22 1438       Activity Barriers & Cardiac Risk Stratification   Activity Barriers Shortness of Breath;Left Knee Replacement;Other (comment);Back Problems    Comments Neuropathy in feet, Hip Pain             6 Minute Walk:  6 Minute Walk     Row Name 04/27/22 1433         6 Minute Walk   Phase Initial     Distance 800 feet     Walk Time 6 minutes     # of Rest  Breaks 0     MPH 1.52     METS 2.15     RPE 13     Perceived Dyspnea  2     VO2 Peak 7.52     Symptoms Yes (comment)     Comments SOB, Hip Pain 4/10     Resting HR 66 bpm     Resting BP 116/62     Resting Oxygen Saturation  98 %     Exercise Oxygen Saturation  during 6 min walk 96 %     Max Ex. HR 96 bpm     Max Ex. BP 124/66     2 Minute Post BP 104/64       Interval HR   1 Minute HR 96     2 Minute HR 96     3 Minute HR 96     4 Minute HR 94     5 Minute HR 94     6 Minute HR 90     2 Minute Post HR 69     Interval Heart Rate? Yes       Interval Oxygen   Interval Oxygen? Yes     Baseline Oxygen Saturation % 98 %     1 Minute Oxygen Saturation % 98 %     1 Minute Liters of Oxygen 0 L  RA     2 Minute Oxygen Saturation % 97 %     2 Minute Liters of Oxygen 0 L  RA     3 Minute Oxygen Saturation % 96 %     3 Minute Liters of Oxygen 0 L  RA     4 Minute Oxygen Saturation % 98 %     4 Minute Liters of Oxygen 0 L  RA     5 Minute Oxygen Saturation % 99 %     5 Minute Liters of Oxygen 0 L  RA     6 Minute Oxygen Saturation % 98 %     6 Minute Liters of Oxygen 0 L  RA     2 Minute Post Oxygen Saturation %  99 %     2 Minute Post Liters of Oxygen 0 L  RA             Oxygen Initial Assessment:  Oxygen Initial Assessment - 04/26/22 1112       Home Oxygen   Home Oxygen Device None    Sleep Oxygen Prescription None    Home Exercise Oxygen Prescription None    Home Resting Oxygen Prescription None      Initial 6 min Walk   Oxygen Used None      Program Oxygen Prescription   Program Oxygen Prescription None      Intervention   Short Term Goals To learn and demonstrate proper pursed lip breathing techniques or other breathing techniques. ;To learn and understand importance of monitoring SPO2 with pulse oximeter and demonstrate accurate use of the pulse oximeter.;To learn and understand importance of maintaining oxygen saturations>88%;To learn and exhibit compliance  with exercise, home and travel O2 prescription    Long  Term Goals Exhibits compliance with exercise, home  and travel O2 prescription;Maintenance of O2 saturations>88%;Demonstrates proper use of MDI's;Exhibits proper breathing techniques, such as pursed lip breathing or other method taught during program session;Verbalizes importance of monitoring SPO2 with pulse oximeter and return demonstration             Oxygen Re-Evaluation:  Oxygen Re-Evaluation     Row Name 05/03/22 1102 05/19/22 1127 05/28/22 1103 06/16/22 1152 06/30/22 1107     Program Oxygen Prescription   Program Oxygen Prescription -- None None None None     Home Oxygen   Home Oxygen Device -- None None None None   Sleep Oxygen Prescription -- None None None None   Home Exercise Oxygen Prescription -- None None None None   Home Resting Oxygen Prescription -- None None None None     Goals/Expected Outcomes   Short Term Goals -- To learn and understand importance of maintaining oxygen saturations>88%;To learn and understand importance of monitoring SPO2 with pulse oximeter and demonstrate accurate use of the pulse oximeter. To learn and demonstrate proper pursed lip breathing techniques or other breathing techniques.  To learn and demonstrate proper pursed lip breathing techniques or other breathing techniques.  To learn and demonstrate proper use of respiratory medications   Long  Term Goals -- Verbalizes importance of monitoring SPO2 with pulse oximeter and return demonstration;Maintenance of O2 saturations>88% Exhibits proper breathing techniques, such as pursed lip breathing or other method taught during program session Exhibits proper breathing techniques, such as pursed lip breathing or other method taught during program session Compliance with respiratory medication   Comments Reviewed PLB technique with pt.  Talked about how it works and it's importance in maintaining their exercise saturations. Angelisse has a pulse oximeter  to check her oxygen saturation at home. Informed and explained why it is important to have one. Reviewed that oxygen saturations should be 88 percent and above. Patient verbalizes understanding Informed patient how to perform the Pursed Lipped breathing technique. Told patient to Inhale through the nose and out the mouth with pursed lips to keep their airways open, help oxygenate them better, practice when at rest or doing strenuous activity. Patient Verbalizes understanding of technique and will work on and be reiterated during [place]. Diaphragmatic and PLB breathing explained and performed with patient. Patient has a better understanding of how to do these exercises to help with breathing performance and relaxation. Patient performed breathing techniques adequately and to practice further at home. Dione states she  has been using her breathing techniques to help her shortness of breath. Her shortness of breath has been better since the start of the program. She has an albuterol inhaler that she uses as needed. She has not needed to use her inhaler in a long times.   Goals/Expected Outcomes Short: Become more profiecient at using PLB. Long: Become independent at using PLB. Short: monitor oxygen at home with exertion. Long: maintain oxygen saturations above 88 percent independently. Short: use PLB with exertion. Long: use PLB on exertion proficiently and independently. Short: practice PLB and diaphragmatic breathing at home. Long: Use PLB and diaphragmatic breathing independently post [employer]. Short: continue working on breathing techniques. Long: maintain breathing techniques independently.            Oxygen Discharge (Final Oxygen Re-Evaluation):  Oxygen Re-Evaluation - 06/30/22 1107       Program Oxygen Prescription   Program Oxygen Prescription None      Home Oxygen   Home Oxygen Device None    Sleep Oxygen Prescription None    Home Exercise Oxygen Prescription None    Home Resting Oxygen  Prescription None      Goals/Expected Outcomes   Short Term Goals To learn and demonstrate proper use of respiratory medications    Long  Term Goals Compliance with respiratory medication    Comments Lisia states she has been using her breathing techniques to help her shortness of breath. Her shortness of breath has been better since the start of the program. She has an albuterol inhaler that she uses as needed. She has not needed to use her inhaler in a long times.    Goals/Expected Outcomes Short: continue working on breathing techniques. Long: maintain breathing techniques independently.             Initial Exercise Prescription:  Initial Exercise Prescription - 04/27/22 1400       Date of Initial Exercise RX and Referring Provider   Date 04/27/22    Referring Provider Dr Ottie Glazier      Oxygen   Maintain Oxygen Saturation 88% or higher      Recumbant Bike   Level 1    RPM 50    Watts 15    Minutes 15    METs 2.15      NuStep   Level 2    SPM 80    Minutes 15    METs 2.15      REL-XR   Level 1    Watts 15    Speed 50    Minutes 15    METs 2.15      Track   Laps 20    Minutes 15    METs 2.09      Prescription Details   Frequency (times per week) 3    Duration Progress to 30 minutes of continuous aerobic without signs/symptoms of physical distress      Intensity   THRR 40-80% of Max Heartrate 102-138    Ratings of Perceived Exertion 11-13    Perceived Dyspnea 0-4      Progression   Progression Continue to progress workloads to maintain intensity without signs/symptoms of physical distress.      Resistance Training   Training Prescription Yes    Weight 2 lb    Reps 10-15             Perform Capillary Blood Glucose checks as needed.  Exercise Prescription Changes:   Exercise Prescription Changes     Row Name  04/27/22 1400 05/13/22 0900 05/24/22 1400 06/07/22 1400 06/21/22 1400     Response to Exercise   Blood Pressure (Admit) 116/62  112/64 120/64 124/62 110/60   Blood Pressure (Exercise) 124/66 138/72 128/68 140/70 128/68   Blood Pressure (Exit) 104/64 126/62 124/62 112/64 116/66   Heart Rate (Admit) 66 bpm 68 bpm 71 bpm 77 bpm 68 bpm   Heart Rate (Exercise) 96 bpm 85 bpm 106 bpm 106 bpm 97 bpm   Heart Rate (Exit) 69 bpm 77 bpm 86 bpm 87 bpm 79 bpm   Oxygen Saturation (Admit) 98 % 98 % 96 % 98 % 98 %   Oxygen Saturation (Exercise) 96 % 98 % 93 % 96 % 93 %   Oxygen Saturation (Exit) 99 % 97 % 98 % 96 % 99 %   Rating of Perceived Exertion (Exercise) 13 15 13 14 15   $ Perceived Dyspnea (Exercise) 2 2 1 2 3   $ Symptoms SOB Hip Pain SOB SOB SOB SOB   Comments 6MWT Results 2nd full day of exercise -- -- --   Duration -- Progress to 30 minutes of  aerobic without signs/symptoms of physical distress Continue with 30 min of aerobic exercise without signs/symptoms of physical distress. Continue with 30 min of aerobic exercise without signs/symptoms of physical distress. Continue with 30 min of aerobic exercise without signs/symptoms of physical distress.   Intensity -- THRR unchanged THRR unchanged THRR unchanged THRR unchanged     Progression   Progression -- Continue to progress workloads to maintain intensity without signs/symptoms of physical distress. Continue to progress workloads to maintain intensity without signs/symptoms of physical distress. Continue to progress workloads to maintain intensity without signs/symptoms of physical distress. Continue to progress workloads to maintain intensity without signs/symptoms of physical distress.   Average METs -- 2.53 2.86 2.98 3.07     Resistance Training   Training Prescription -- Yes Yes Yes Yes   Weight -- 2 lb 2 lb 2 lb 2 lb   Reps -- 10-15 10-15 10-15 10-15     Interval Training   Interval Training -- No No No No     Recumbant Bike   Level -- 1 2 2 $ 2.5   Watts -- -- -- 15 18   Minutes -- 15 15 15 15   $ METs -- 2.1 3.1 2.63 2.9     NuStep   Level -- 2 4 -- 4   Minutes  -- -- 15 -- 15   METs -- 2.3 3 -- 3.3     Arm Ergometer   Level -- -- -- -- 1   Minutes -- -- -- -- 15   METs -- -- -- -- 1     REL-XR   Level -- 1 1 4 4   $ Minutes -- 15 15 15 15   $ METs -- 3.2 2.7 3.5 3.8     T5 Nustep   Level -- -- -- 2 --   SPM -- -- -- 80 --   Minutes -- -- -- 15 --   METs -- -- -- 2.1 --     Oxygen   Maintain Oxygen Saturation -- 88% or higher 88% or higher 88% or higher 88% or higher    Row Name 07/05/22 1200             Response to Exercise   Blood Pressure (Admit) 102/62       Blood Pressure (Exercise) 162/60       Blood Pressure (Exit) 110/62  Heart Rate (Admit) 66 bpm       Heart Rate (Exercise) 114 bpm       Heart Rate (Exit) 71 bpm       Oxygen Saturation (Admit) 98 %       Oxygen Saturation (Exercise) 95 %       Oxygen Saturation (Exit) 98 %       Rating of Perceived Exertion (Exercise) 15       Symptoms SOB       Duration Continue with 30 min of aerobic exercise without signs/symptoms of physical distress.       Intensity THRR unchanged         Progression   Progression Continue to progress workloads to maintain intensity without signs/symptoms of physical distress.       Average METs 3.1         Resistance Training   Training Prescription Yes       Weight 2 lb       Reps 10-15         Interval Training   Interval Training No         Arm Ergometer   Level 1       Minutes 15       METs 2         REL-XR   Level 5       Minutes 15       METs 4.2         Oxygen   Maintain Oxygen Saturation 88% or higher                Exercise Comments:   Exercise Comments     Row Name 05/03/22 1100           Exercise Comments First full day of exercise!  Patient was oriented to gym and equipment including functions, settings, policies, and procedures.  Patient's individual exercise prescription and treatment plan were reviewed.  All starting workloads were established based on the results of the 6 minute walk test done  at initial orientation visit.  The plan for exercise progression was also introduced and progression will be customized based on patient's performance and goals.                Exercise Goals and Review:   Exercise Goals     Row Name 04/27/22 1449             Exercise Goals   Increase Physical Activity Yes       Intervention Provide advice, education, support and counseling about physical activity/exercise needs.;Develop an individualized exercise prescription for aerobic and resistive training based on initial evaluation findings, risk stratification, comorbidities and participant's personal goals.       Expected Outcomes Short Term: Attend rehab on a regular basis to increase amount of physical activity.;Long Term: Add in home exercise to make exercise part of routine and to increase amount of physical activity.;Long Term: Exercising regularly at least 3-5 days a week.       Increase Strength and Stamina Yes       Intervention Provide advice, education, support and counseling about physical activity/exercise needs.;Develop an individualized exercise prescription for aerobic and resistive training based on initial evaluation findings, risk stratification, comorbidities and participant's personal goals.       Expected Outcomes Short Term: Perform resistance training exercises routinely during rehab and add in resistance training at home;Short Term: Increase workloads from initial exercise prescription for resistance, speed, and METs.;Long Term:  Improve cardiorespiratory fitness, muscular endurance and strength as measured by increased METs and functional capacity (6MWT)       Able to understand and use rate of perceived exertion (RPE) scale Yes       Intervention Provide education and explanation on how to use RPE scale       Expected Outcomes Short Term: Able to use RPE daily in rehab to express subjective intensity level;Long Term:  Able to use RPE to guide intensity level when  exercising independently       Able to understand and use Dyspnea scale Yes       Intervention Provide education and explanation on how to use Dyspnea scale       Expected Outcomes Long Term: Able to use Dyspnea scale to guide intensity level when exercising independently;Short Term: Able to use Dyspnea scale daily in rehab to express subjective sense of shortness of breath during exertion       Knowledge and understanding of Target Heart Rate Range (THRR) Yes       Intervention Provide education and explanation of THRR including how the numbers were predicted and where they are located for reference       Expected Outcomes Short Term: Able to state/look up THRR;Long Term: Able to use THRR to govern intensity when exercising independently;Short Term: Able to use daily as guideline for intensity in rehab       Able to check pulse independently Yes       Intervention Provide education and demonstration on how to check pulse in carotid and radial arteries.;Review the importance of being able to check your own pulse for safety during independent exercise       Expected Outcomes Short Term: Able to explain why pulse checking is important during independent exercise;Long Term: Able to check pulse independently and accurately       Understanding of Exercise Prescription Yes       Intervention Provide education, explanation, and written materials on patient's individual exercise prescription       Expected Outcomes Short Term: Able to explain program exercise prescription;Long Term: Able to explain home exercise prescription to exercise independently                Exercise Goals Re-Evaluation :  Exercise Goals Re-Evaluation     Row Name 05/03/22 1100 05/13/22 0915 05/24/22 1432 06/07/22 1437 06/21/22 1424     Exercise Goal Re-Evaluation   Exercise Goals Review Able to understand and use rate of perceived exertion (RPE) scale;Able to understand and use Dyspnea scale;Knowledge and understanding of  Target Heart Rate Range (THRR);Understanding of Exercise Prescription Increase Physical Activity;Understanding of Exercise Prescription;Increase Strength and Stamina Increase Physical Activity;Understanding of Exercise Prescription;Increase Strength and Stamina Increase Physical Activity;Understanding of Exercise Prescription;Increase Strength and Stamina Increase Physical Activity;Understanding of Exercise Prescription;Increase Strength and Stamina   Comments Reviewed RPE scale, THR and program prescription with pt today.  Pt voiced understanding and was given a copy of goals to take home. Hilder has completed her first two full days of exercise thus far.  She has done well with initial workloads.  We will conintue to monitor her progress. Donnelle is doing well in rehab. She recently improved her overall average MET level to 2.86 METs. She also was able to improve to level 2 on the recumbent bike and level 4 on the T4. She has not done any walking on the track yet, but will encourage her to start. We will continue to monitor her progress.  Solash continues to do well in rehab. She increased to level 4 on the XR and tried out the T5 Nustep and was able to exercise at a level 2. She has been hitting her THR each session. She could benefit from increasing to 3 lb handweights. Will continue to monitor. Shannah continues to do well in the program. She has not done any walking recently, but did increase her overall average MET level to 3.07 METs. She also began using the arm crank at level 1 and did well. She has continued to use 2 lb hand weigths fro resistance training as well. We will continue to monitor her progress.   Expected Outcomes Short: Use RPE daily to regulate intensity. Long: Follow program prescription in THR. Short: Attend rehab regularly Long: Continue to improve stamina Short: Begin walking on the track. Long: Continue to improve strength and stamina. Short: Increase handweights to 3 lb Long: Continue to  increase overall stamina and MET level Short: Increase handweights to 3 lb, return to walking. Long: Continue to improve strength and stamina.    Ona Name 07/05/22 1205             Exercise Goal Re-Evaluation   Exercise Goals Review Increase Physical Activity;Understanding of Exercise Prescription;Increase Strength and Stamina       Comments Gargi has been inconsistent with her attendance at rehab. Staff will talk to patient to reiterate attendance policy to yield good results. She did, however, increase to level 5 on the XR the 1 session she was here from last review. We will continue to monitor.       Expected Outcomes Short: Maintain good attendance Long: Continue to increase overall MET level and stamina                Discharge Exercise Prescription (Final Exercise Prescription Changes):  Exercise Prescription Changes - 07/05/22 1200       Response to Exercise   Blood Pressure (Admit) 102/62    Blood Pressure (Exercise) 162/60    Blood Pressure (Exit) 110/62    Heart Rate (Admit) 66 bpm    Heart Rate (Exercise) 114 bpm    Heart Rate (Exit) 71 bpm    Oxygen Saturation (Admit) 98 %    Oxygen Saturation (Exercise) 95 %    Oxygen Saturation (Exit) 98 %    Rating of Perceived Exertion (Exercise) 15    Symptoms SOB    Duration Continue with 30 min of aerobic exercise without signs/symptoms of physical distress.    Intensity THRR unchanged      Progression   Progression Continue to progress workloads to maintain intensity without signs/symptoms of physical distress.    Average METs 3.1      Resistance Training   Training Prescription Yes    Weight 2 lb    Reps 10-15      Interval Training   Interval Training No      Arm Ergometer   Level 1    Minutes 15    METs 2      REL-XR   Level 5    Minutes 15    METs 4.2      Oxygen   Maintain Oxygen Saturation 88% or higher             Nutrition:  Target Goals: Understanding of nutrition guidelines, daily  intake of sodium <158m, cholesterol <2037m calories 30% from fat and 7% or less from saturated fats, daily to have 5 or more servings of fruits and  vegetables.  Education: All About Nutrition: -Group instruction provided by verbal, written material, interactive activities, discussions, models, and posters to present general guidelines for heart healthy nutrition including fat, fiber, MyPlate, the role of sodium in heart healthy nutrition, utilization of the nutrition label, and utilization of this knowledge for meal planning. Follow up email sent as well. Written material given at graduation.   Biometrics:  Pre Biometrics - 04/27/22 1449       Pre Biometrics   Height 5' 4"$  (1.626 m)    Weight 166 lb 11.2 oz (75.6 kg)    Waist Circumference 34.5 inches    Hip Circumference 40.5 inches    Waist to Hip Ratio 0.85 %    BMI (Calculated) 28.6    Single Leg Stand 2.4 seconds   Left Leg             Nutrition Therapy Plan and Nutrition Goals:  Nutrition Therapy & Goals - 04/27/22 1028       Nutrition Therapy   Diet Heart healthy, low Na, pulmonary MNT    Protein (specify units) 90-95g    Fiber 25 grams    Whole Grain Foods 3 servings    Saturated Fats 12 max. grams    Fruits and Vegetables 8 servings/day    Sodium 1.5 grams      Personal Nutrition Goals   Nutrition Goal ST: practice MyPlate guidelines, include 2-3 snacks with fiber, protein, and heart healthy fat  LT: follow MyPlate guidelines, limit Na <1.5g/day, eat a variety of fruits and vegetables    Comments 67 y.o. F admitted to pulmonary rehab s/p dyspnea. PMHx includes HTN, migraine, CKD stg 3a, Sjogrens syndrome. Relevant medication includes dexilant, folic acid, pilocarpine. Tameeka reports having abdominal surgery to rotate her bowel back to normal position. She is having pain from a new hernia due to surgery, but the MD reports She reports losing 12 lbs in the last 7 weeks due to loss of appetite. She reports having  irregular eating habits now  such as snacking or rice pudding, oatmeal, 1 meal per day includes pork, chicken, starchy vegetables and some non-starchy vegetables (she doesn't like most leafy greens or cruciferous vegetables). She reports going out to eat more now as she has not been cooking much at home since the surgery. Discussed sodium concerns with eating out and encouraged to get back into the habit of eating more foods at home. Adlih reports having a hard time recalling foods at this time. Drinks: water. Discussed general healthy eating and pulmnary MNT. discussed including snacks during the day that include protein, fiber, and healthy fats to boost her nutrition and to ensure she is meeting her energy and protein needs to avoid losing msucle tissue. Some snacks may inlcude things like fruit and peanut butter or greek yogurt.      Intervention Plan   Intervention Prescribe, educate and counsel regarding individualized specific dietary modifications aiming towards targeted core components such as weight, hypertension, lipid management, diabetes, heart failure and other comorbidities.;Nutrition handout(s) given to patient.    Expected Outcomes Short Term Goal: A plan has been developed with personal nutrition goals set during dietitian appointment.;Short Term Goal: Understand basic principles of dietary content, such as calories, fat, sodium, cholesterol and nutrients.;Long Term Goal: Adherence to prescribed nutrition plan.             Nutrition Assessments:  MEDIFICTS Score Key: ?70 Need to make dietary changes  40-70 Heart Healthy Diet ? 40 Therapeutic Level Cholesterol Diet  Flowsheet Row Pulmonary Rehab from 04/27/2022 in [facility] Cardiac and Pulmonary Rehab  Picture Your Plate Total Score on Admission 62      Picture Your Plate Scores: [id number] Unhealthy dietary pattern with much room for improvement. 41-50 Dietary pattern unlikely to meet recommendations for good health and room for  improvement. 51-60 More healthful dietary pattern, with some room for improvement.  >60 Healthy dietary pattern, although there may be some specific behaviors that could be improved.   Nutrition Goals Re-Evaluation:  Nutrition Goals Re-Evaluation     Langlois Name 05/19/22 1128 05/28/22 1105 06/30/22 1109         Goals   Current Weight -- 165 lb (74.8 kg) 165 lb (74.8 kg)     Nutrition Goal -- Eat more fruits and Vegetables Work on appetite     Comment Patient was informed on why it is important to maintain a balanced diet when dealing with Respiratory issues. Explained that it takes a lot of energy to breath and when they are short of breath often they will need to have a good diet to help keep up with the calories they are expending for breathing. Shakendra wants to try to eat healthier. There are some vegetables that she does not like. She likes fruit, red peppers and leans protiens. Her husband is a diabetic and she wants to eat healthy for him to. Genisis states that her diet is going well but has not had that much of an appetite. Her appetite has been lower due to her stomach surgery back in October. She does not feel as hungy and lose about 20 pounds since the surgery.     Expected Outcome Short: Choose and plan snacks accordingly to patients caloric intake to improve breathing. Long: Maintain a diet independently that meets their caloric intake to aid in daily shortness of breath. Short: eat more vegetables. Long: adhere to a diet that pertains to her. Short: eat smaller more frequent meals. Long: maintain a diet that can keep up with her appetite.              Nutrition Goals Discharge (Final Nutrition Goals Re-Evaluation):  Nutrition Goals Re-Evaluation - 06/30/22 1109       Goals   Current Weight 165 lb (74.8 kg)    Nutrition Goal Work on appetite    Comment Shanade states that her diet is going well but has not had that much of an appetite. Her appetite has been lower due to her stomach  surgery back in October. She does not feel as hungy and lose about 20 pounds since the surgery.    Expected Outcome Short: eat smaller more frequent meals. Long: maintain a diet that can keep up with her appetite.             Psychosocial: Target Goals: Acknowledge presence or absence of significant depression and/or stress, maximize coping skills, provide positive support system. Participant is able to verbalize types and ability to use techniques and skills needed for reducing stress and depression.   Education: Stress, Anxiety, and Depression - Group verbal and visual presentation to define topics covered.  Reviews how body is impacted by stress, anxiety, and depression.  Also discusses healthy ways to reduce stress and to treat/manage anxiety and depression.  Written material given at graduation.   Education: Sleep Hygiene -Provides group verbal and written instruction about how sleep can affect your health.  Define sleep hygiene, discuss sleep cycles and impact of sleep habits. Review good sleep hygiene  tips.    Initial Review & Psychosocial Screening:  Initial Psych Review & Screening - 04/26/22 1114       Initial Review   Current issues with Current Sleep Concerns;Current Stress Concerns    Source of Stress Concerns Chronic Illness      Family Dynamics   Good Support System? Yes    Comments Annis states that she does not sleep very well. She wakes up every couple hours or so. She can look to her family for support and has great support from the rest of her family. Alexah may have to go back for more abnominal surgery due to a hernia which creates some stress.      Barriers   Psychosocial barriers to participate in program The patient should benefit from training in stress management and relaxation.;There are no identifiable barriers or psychosocial needs.      Screening Interventions   Interventions Encouraged to exercise;To provide support and resources with identified  psychosocial needs;Provide feedback about the scores to participant    Expected Outcomes Short Term goal: Utilizing psychosocial counselor, staff and physician to assist with identification of specific Stressors or current issues interfering with healing process. Setting desired goal for each stressor or current issue identified.;Long Term Goal: Stressors or current issues are controlled or eliminated.;Short Term goal: Identification and review with participant of any Quality of Life or Depression concerns found by scoring the questionnaire.;Long Term goal: The participant improves quality of Life and PHQ9 Scores as seen by post scores and/or verbalization of changes             Quality of Life Scores:  Scores of 19 and below usually indicate a poorer quality of life in these areas.  A difference of  2-3 points is a clinically meaningful difference.  A difference of 2-3 points in the total score of the Quality of Life Index has been associated with significant improvement in overall quality of life, self-image, physical symptoms, and general health in studies assessing change in quality of life.  PHQ-9: Review Flowsheet       06/30/2022 05/28/2022 04/27/2022  Depression screen PHQ 2/9  Decreased Interest 0 0 1  Down, Depressed, Hopeless 0 0 1  PHQ - 2 Score 0 0 2  Altered sleeping 3 3 3  $ Tired, decreased energy 3 3 3  $ Change in appetite 2 2 3  $ Feeling bad or failure about yourself  0 0 1  Trouble concentrating 0 0 0  Moving slowly or fidgety/restless 0 0 0  Suicidal thoughts 0 0 0  PHQ-9 Score 8 8 12  $ Difficult doing work/chores Not difficult at all Not difficult at all Very difficult   Interpretation of Total Score  Total Score Depression Severity:  1-4 = Minimal depression, 5-9 = Mild depression, 10-14 = Moderate depression, 15-19 = Moderately severe depression, 20-27 = Severe depression   Psychosocial Evaluation and Intervention:  Psychosocial Evaluation - 04/26/22 1116        Psychosocial Evaluation & Interventions   Interventions Encouraged to exercise with the program and follow exercise prescription;Relaxation education;Stress management education    Comments Sherill states that she does not sleep very well. She wakes up every couple hours or so. She can look to her family for support and has great support from the rest of her family.    Expected Outcomes Short: Start [facility] to help with mood. Long: Maintain a healthy mental state.    Continue Psychosocial Services  Follow up required by staff  Psychosocial Re-Evaluation:  Psychosocial Re-Evaluation     Decherd Name 05/28/22 1111 06/30/22 1113           Psychosocial Re-Evaluation   Current issues with None Identified Current Sleep Concerns      Comments Reviewed patient health questionnaire (PHQ-9) with patient for follow up. Previously, patients score indicated signs/symptoms of depression.  Reviewed to see if patient is improving symptom wise while in program.  Score improved and patient states that it is because she has been trying to improve her health more. Reviewed patient health questionnaire (PHQ-9) with patient for follow up. Previously, patients score indicated signs/symptoms of depression.  Reviewed to see if patient is improving symptom wise while in program.  Score stayed the same and patient states that it is because her energy and sleep have been low and the same as last evaluation.      Expected Outcomes Short: Continue to attend [facility] regularly for regular exercise and social engagement. Long: Continue to improve symptoms and manage a positive mental state. Short: Continue to attend [facility] regularly for regular exercise and social engagement. Long: Continue to improve symptoms and manage a positive mental state.      Interventions Encouraged to attend Pulmonary Rehabilitation for the exercise Encouraged to attend Pulmonary Rehabilitation for the exercise       Continue Psychosocial Services  Follow up required by staff Follow up required by staff               Psychosocial Discharge (Final Psychosocial Re-Evaluation):  Psychosocial Re-Evaluation - 06/30/22 1113       Psychosocial Re-Evaluation   Current issues with Current Sleep Concerns    Comments Reviewed patient health questionnaire (PHQ-9) with patient for follow up. Previously, patients score indicated signs/symptoms of depression.  Reviewed to see if patient is improving symptom wise while in program.  Score stayed the same and patient states that it is because her energy and sleep have been low and the same as last evaluation.    Expected Outcomes Short: Continue to attend [facility] regularly for regular exercise and social engagement. Long: Continue to improve symptoms and manage a positive mental state.    Interventions Encouraged to attend Pulmonary Rehabilitation for the exercise    Continue Psychosocial Services  Follow up required by staff             Education: Education Goals: Education classes will be provided on a weekly basis, covering required topics. Participant will state understanding/return demonstration of topics presented.  Learning Barriers/Preferences:  Learning Barriers/Preferences - 04/26/22 1113       Learning Barriers/Preferences   Learning Barriers None    Learning Preferences None             General Pulmonary Education Topics:  Infection Prevention: - Provides verbal and written material to individual with discussion of infection control including proper hand washing and proper equipment cleaning during exercise session. Flowsheet Row Pulmonary Rehab from 06/16/2022 in [facility] Cardiac and Pulmonary Rehab  Date 04/26/22  Educator [facility]  Instruction Review Code 1- Verbalizes Understanding       Falls Prevention: - Provides verbal and written material to individual with discussion of falls prevention and safety. Flowsheet Row  Pulmonary Rehab from 06/16/2022 in [facility] Cardiac and Pulmonary Rehab  Date 04/26/22  Educator [facility]  Instruction Review Code 1- Verbalizes Understanding       Chronic Lung Disease Review: - Group verbal instruction with posters, models, PowerPoint presentations and videos,  to review new updates,  new respiratory medications, new advancements in procedures and treatments. Providing information on websites and "800" numbers for continued self-education. Includes information about supplement oxygen, available portable oxygen systems, continuous and intermittent flow rates, oxygen safety, concentrators, and Medicare reimbursement for oxygen. Explanation of Pulmonary Drugs, including class, frequency, complications, importance of spacers, rinsing mouth after steroid MDI's, and proper cleaning methods for nebulizers. Review of basic lung anatomy and physiology related to function, structure, and complications of lung disease. Review of risk factors. Discussion about methods for diagnosing sleep apnea and types of masks and machines for OSA. Includes a review of the use of types of environmental controls: home humidity, furnaces, filters, dust mite/pet prevention, HEPA vacuums. Discussion about weather changes, air quality and the benefits of nasal washing. Instruction on Warning signs, infection symptoms, calling MD promptly, preventive modes, and value of vaccinations. Review of effective airway clearance, coughing and/or vibration techniques. Emphasizing that all should Create an Action Plan. Written material given at graduation. Flowsheet Row Pulmonary Rehab from 06/16/2022 in [facility] Cardiac and Pulmonary Rehab  Education need identified 04/27/22  Date 05/12/22  Educator [facility]  Instruction Review Code 1- Verbalizes Understanding       AED/CPR: - Group verbal and written instruction with the use of models to demonstrate the basic use of the AED with the basic ABC's of resuscitation.    Anatomy and Cardiac  Procedures: - Group verbal and visual presentation and models provide information about basic cardiac anatomy and function. Reviews the testing methods done to diagnose heart disease and the outcomes of the test results. Describes the treatment choices: Medical Management, Angioplasty, or Coronary Bypass Surgery for treating various heart conditions including Myocardial Infarction, Angina, Valve Disease, and Cardiac Arrhythmias.  Written material given at graduation.   Medication Safety: - Group verbal and visual instruction to review commonly prescribed medications for heart and lung disease. Reviews the medication, class of the drug, and side effects. Includes the steps to properly store meds and maintain the prescription regimen.  Written material given at graduation.   Other: -Provides group and verbal instruction on various topics (see comments)   Knowledge Questionnaire Score:  Knowledge Questionnaire Score - 04/27/22 1422       Knowledge Questionnaire Score   Pre Score 16/18              Core Components/Risk Factors/Patient Goals at Admission:  Personal Goals and Risk Factors at Admission - 04/27/22 1424       Core Components/Risk Factors/Patient Goals on Admission    Weight Management Yes;Weight Loss    Intervention Weight Management: Develop a combined nutrition and exercise program designed to reach desired caloric intake, while maintaining appropriate intake of nutrient and fiber, sodium and fats, and appropriate energy expenditure required for the weight goal.;Weight Management: Provide education and appropriate resources to help participant work on and attain dietary goals.;Weight Management/Obesity: Establish reasonable short term and long term weight goals.    Admit Weight 166 lb 11.2 oz (75.6 kg)    Goal Weight: Short Term 160 lb (72.6 kg)    Goal Weight: Long Term 155 lb (70.3 kg)    Expected Outcomes Short Term: Continue to assess and modify interventions until  short term weight is achieved;Long Term: Adherence to nutrition and physical activity/exercise program aimed toward attainment of established weight goal;Weight Loss: Understanding of general recommendations for a balanced deficit meal plan, which promotes 1-2 lb weight loss per week and includes a negative energy balance of [id number] kcal/d;Understanding recommendations for meals to  include 15-35% energy as protein, 25-35% energy from fat, 35-60% energy from carbohydrates, less than 254m of dietary cholesterol, 20-35 gm of total fiber daily;Understanding of distribution of calorie intake throughout the day with the consumption of 4-5 meals/snacks    Improve shortness of breath with ADL's Yes    Intervention Provide education, individualized exercise plan and daily activity instruction to help decrease symptoms of SOB with activities of daily living.    Expected Outcomes Short Term: Improve cardiorespiratory fitness to achieve a reduction of symptoms when performing ADLs;Long Term: Be able to perform more ADLs without symptoms or delay the onset of symptoms    Hypertension Yes    Intervention Provide education on lifestyle modifcations including regular physical activity/exercise, weight management, moderate sodium restriction and increased consumption of fresh fruit, vegetables, and low fat dairy, alcohol moderation, and smoking cessation.;Monitor prescription use compliance.    Expected Outcomes Short Term: Continued assessment and intervention until BP is < 140/922mHG in hypertensive participants. < 130/8081mG in hypertensive participants with diabetes, heart failure or chronic kidney disease.;Long Term: Maintenance of blood pressure at goal levels.             Education:Diabetes - Individual verbal and written instruction to review signs/symptoms of diabetes, desired ranges of glucose level fasting, after meals and with exercise. Acknowledge that pre and post exercise glucose checks will be done  for 3 sessions at entry of program.   Know Your Numbers and Heart Failure: - Group verbal and visual instruction to discuss disease risk factors for cardiac and pulmonary disease and treatment options.  Reviews associated critical values for Overweight/Obesity, Hypertension, Cholesterol, and Diabetes.  Discusses basics of heart failure: signs/symptoms and treatments.  Introduces Heart Failure Zone chart for action plan for heart failure.  Written material given at graduation.   Core Components/Risk Factors/Patient Goals Review:   Goals and Risk Factor Review     Row Name 05/19/22 1126 05/28/22 1108 06/30/22 1115         Core Components/Risk Factors/Patient Goals Review   Personal Goals Review Improve shortness of breath with ADL's Weight Management/Obesity Hypertension     Review Spoke to patient about their shortness of breath and what they can do to improve. Patient has been informed of breathing techniques when starting the program. Patient is informed to tell staff if they have had any med changes and that certain meds they are taking or not taking can be causing shortness of breath. CatLacyes lost 15 pounds after her abdominal surgery from a bowel blockage. She is trying to eat healthier and lose more weight. Her weight goal is 150 pounds. CatNdeas had good blood pressure reading in the program. Sometimes she is in the lower 90s0000000stolic. She is sometimes dizzy when her pressure gets low but is able to manage it. Her doctor is aware of her lower blood pressure readings. She checks her pressure at home daily.     Expected Outcomes Short: Attend [facility] regularly to improve shortness of breath with ADL's. Long: maintain independence with ADL's Short: lose a few pounds in the next few weeks. Long; reach weight of 150 pounds. Short: check blood pressure readings at home. Long: maintain blood pressure readings independently.              Core Components/Risk Factors/Patient Goals at  Discharge (Final Review):   Goals and Risk Factor Review - 06/30/22 1115       Core Components/Risk Factors/Patient Goals Review   Personal Goals  Review Hypertension    Review Saide has had good blood pressure reading in the program. Sometimes she is in the lower 0000000 systolic. She is sometimes dizzy when her pressure gets low but is able to manage it. Her doctor is aware of her lower blood pressure readings. She checks her pressure at home daily.    Expected Outcomes Short: check blood pressure readings at home. Long: maintain blood pressure readings independently.             ITP Comments:  ITP Comments     Row Name 04/26/22 1111 04/27/22 1421 05/03/22 1100 05/12/22 0948 06/09/22 0859   ITP Comments Virtual Visit completed. Patient informed on EP and RD appointment and 6 Minute walk test. Patient also informed of patient health questionnaires on My Chart. Patient Verbalizes understanding. Visit diagnosis can be found in [facility] 04/12/2022. Completed 6MWT and gym orientation. Initial ITP created and sent for review to Dr. Ottie Glazier, Medical Director. First full day of exercise!  Patient was oriented to gym and equipment including functions, settings, policies, and procedures.  Patient's individual exercise prescription and treatment plan were reviewed.  All starting workloads were established based on the results of the 6 minute walk test done at initial orientation visit.  The plan for exercise progression was also introduced and progression will be customized based on patient's performance and goals. 30 Day review completed. Medical Director ITP review done, changes made as directed, and signed approval by Medical Director.    new to program 30 Day review completed. Medical Director ITP review done, changes made as directed, and signed approval by Medical Director.    [place] Name 07/07/22 0800           ITP Comments 30 day review completed. ITP sent to Dr. Zetta Bills, Medical Director  of  Pulmonary Rehab. Continue with ITP unless changes are made by physician.                Comments: 30 day review

## 2022-07-08 ENCOUNTER — Ambulatory Visit
Admission: EM | Admit: 2022-07-08 | Discharge: 2022-07-08 | Disposition: A | Discharge status: Home / Self Care | Payer: Medicare PPO | Attending: Physician Assistant | Admitting: Physician Assistant

## 2022-07-08 DIAGNOSIS — I10 Essential (primary) hypertension: Secondary | ICD-10-CM | POA: Insufficient documentation

## 2022-07-08 DIAGNOSIS — R051 Acute cough: Secondary | ICD-10-CM | POA: Diagnosis not present

## 2022-07-08 DIAGNOSIS — M797 Fibromyalgia: Secondary | ICD-10-CM | POA: Insufficient documentation

## 2022-07-08 DIAGNOSIS — R059 Cough, unspecified: Secondary | ICD-10-CM | POA: Diagnosis present

## 2022-07-08 DIAGNOSIS — R0981 Nasal congestion: Secondary | ICD-10-CM

## 2022-07-08 DIAGNOSIS — Z20822 Contact with and (suspected) exposure to covid-19: Secondary | ICD-10-CM | POA: Insufficient documentation

## 2022-07-08 DIAGNOSIS — M3500 Sicca syndrome, unspecified: Secondary | ICD-10-CM | POA: Insufficient documentation

## 2022-07-08 DIAGNOSIS — B349 Viral infection, unspecified: Secondary | ICD-10-CM | POA: Diagnosis not present

## 2022-07-08 LAB — SARS CORONAVIRUS 2 BY RT PCR: SARS Coronavirus 2 by RT PCR: NEGATIVE

## 2022-07-08 MED ORDER — PROMETHAZINE-DM 6.25-15 MG/5ML PO SYRP: 5.0000 mL | ORAL_SOLUTION | Freq: Four times a day (QID) | ORAL | 0 refills | Status: DC | PRN

## 2022-07-08 NOTE — ED Triage Notes (Signed)
Pt c/o cough, sore throat,congestion, headache onset Monday. Pt states everything started with cough, spouse tested positive for covid, pt states she did a home test yesterday & this morning (both results negative)

## 2022-07-08 NOTE — Discharge Instructions (Addendum)
URI/COLD SYMPTOMS: Your exam today is consistent with a viral illness. Antibiotics are not indicated at this time. Use medications as directed, including cough syrup, nasal saline, and decongestants. Your symptoms should improve over the next few days and resolve within 7-10 days. Increase rest and fluids. F/u if symptoms worsen or predominate such as sore throat, ear pain, productive cough, shortness of breath, or if you develop high fevers or worsening fatigue over the next several days.    

## 2022-07-08 NOTE — ED Provider Notes (Signed)
[facility]    CSN: [id number] Arrival date & time: 07/08/22  1006      History   Chief Complaint Chief Complaint  Patient presents with   Cough   Nasal Congestion   Covid Exposure    HPI Amy Huynh is a 67 y.o. female presenting for sore throat, cough, congestion, headaches, and fatigue x 3 days.  She reports that her husband tested positive for COVID-19 with a home test.  She is taken a couple test and they have been negative but she still concerned about the possibility of COVID.  She says her husband sicker than she has.  She denies any fevers.  Says her temperatures been around 99 degrees.  Denies any breathing difficulty, abdominal pain, vomiting or diarrhea.  Has been occasionally taking OTC meds for symptoms but has not had any medicine today.  Her medical history significant for fibromyalgia, arthritis, Sjogren's, and hypertension.  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   Melena    Heartburn    Acute peptic ulcer of stomach    Bradycardia 10/16/2020   Stage 3a chronic kidney disease ([place]) 06/24/2020   Abnormal ECG 05/22/2020   History of degenerative disc disease 01/29/2020   LVH (left ventricular hypertrophy) 11/15/2019   Acute respiratory failure with hypoxia ([place]) 09/16/2019   Multilobar lung infiltrate 09/16/2019   Hypomagnesemia 09/16/2019   Muscle weakness (generalized) 09/16/2019   Community acquired pneumonia 09/12/2019   Hypokalemia 09/12/2019   Psoriasis 02/15/2019   Personal history of skin cancer 12/01/2018   Sjogren's syndrome ([place]) 10/11/2017   Family history of ovarian cancer 01/11/2017   Acute bursitis of right shoulder 06/07/2016   Partial tear of right  rotator cuff 06/07/2016   Lumbar radiculopathy 07/23/2015   Migraine without aura and without status migrainosus, not intractable 04/23/2015   Chronic daily headache 04/23/2015   DDD (degenerative disc disease), cervical 07/06/2014   Neck pain 07/06/2014   Dyspareunia 12/14/2013   Benign essential hypertension 09/13/2013   Polyarthralgia 07/20/2013   Diverticula of colon 01/22/2013   Fibromyalgia 01/22/2013   Restless legs syndrome 01/22/2013   Moderate obstructive sleep apnea 01/22/2013   Migraine headache 01/22/2013   Insomnia 01/22/2013   Generalized anxiety disorder 01/22/2013   Esophageal reflux 01/22/2013   Atrophic vaginitis 01/22/2013   Anemia 01/22/2013   Allergic rhinitis 01/22/2013   Urticaria 01/18/2013   Low back pain 10/03/2012   Lumbosacral spondylosis 08/17/2012    Past Surgical History:  Procedure Laterality Date   BIOPSY N/A 06/26/2021   Procedure: BIOPSY;  Surgeon: Lucilla Lame, MD;  Location: [place];  Service: Endoscopy;  Laterality: N/A;   CERVICAL FUSION     CESAREAN SECTION     CHOLECYSTECTOMY     ESOPHAGOGASTRODUODENOSCOPY (EGD) WITH PROPOFOL N/A 06/26/2021   Procedure: ESOPHAGOGASTRODUODENOSCOPY (EGD) WITH PROPOFOL;  Surgeon: Lucilla Lame, MD;  Location: [place];  Service: Endoscopy;  Laterality: N/A;   HERNIA REPAIR     LAPAROTOMY  03/11/2022   Procedure: EXPLORATORY LAPAROTOMY Mesenteric mass biopsy;  Surgeon: Benjamine Sprague, DO;  Location: [facility];  Service: General;;   REPLACEMENT TOTAL KNEE Left  SHOULDER SURGERY Right     OB History     Gravida  4   Para  3   Term  3   Preterm      AB  1   Living  2      SAB  1   IAB      Ectopic      Multiple      Live Births  3            Home Medications    Prior to Admission medications   Medication Sig Start Date End Date Taking? Authorizing Provider  promethazine-dextromethorphan (PROMETHAZINE-DM) 6.25-15 MG/5ML syrup Take 5 mLs by mouth 4 (four)  times daily as needed. 07/08/22  Yes Danton Clap, PA-C  Apremilast (OTEZLA) 30 MG TABS Take 30 mg by mouth 2 (two) times daily.    [provider]  butalbital-acetaminophen-caffeine (FIORICET, ESGIC) 50-325-40 MG tablet Take 1 tablet by mouth every 6 (six) hours as needed for headache.    [provider]  cetirizine (ZYRTEC) 10 MG tablet Take 10 mg by mouth daily.    [provider]  clobetasol cream (TEMOVATE) 0.05 % Apply topically. 09/07/17   [provider]  cyanocobalamin 1000 MCG tablet Take 1,000 mcg by mouth daily.    [provider]  dexlansoprazole (DEXILANT) 60 MG capsule TAKE 1 CAPSULE(60 MG) BY MOUTH DAILY 04/13/22   Lucilla Lame, MD  Eptinezumab-jjmr (VYEPTI) 100 MG/ML injection Inject 100 mg into the vein every 3 (three) months. Last Saturday    [provider]  fexofenadine (ALLEGRA) 180 MG tablet Take 180 mg by mouth daily.    [provider]  folic acid (FOLVITE) 1 MG tablet Take 1 mg by mouth daily. 08/16/19   [provider]  gabapentin (NEURONTIN) 300 MG capsule Take 900 mg by mouth at bedtime.    [provider]  hydrOXYzine (ATARAX/VISTARIL) 10 MG tablet Take 10 mg by mouth every 8 (eight) hours as needed.    [provider]  hyoscyamine (LEVSIN, ANASPAZ) 0.125 MG tablet Take 0.125 mg by mouth every 4 (four) hours as needed.    [provider]  methocarbamol (ROBAXIN) 500 MG tablet Take 500 mg by mouth at bedtime.  08/22/17   [provider]  methotrexate (RHEUMATREX) 2.5 MG tablet Take 20 mg by mouth once a week. Take 4 tablets (10 mg ) by mouth in the morning and 4 tablets(10 mg by mouth in the evening on Saturdays (total= 20 mg) 02/08/18   [provider]  ondansetron (ZOFRAN-ODT) 4 MG disintegrating tablet Take 1 tablet (4 mg total) by mouth every 8 (eight) hours as needed for nausea or vomiting. 05/06/22   Carrie Mew, MD  pilocarpine (SALAGEN) 5 MG  tablet Take 5 mg by mouth 3 (three) times daily.    [provider]  promethazine (PHENERGAN) 25 MG tablet Take 25 mg by mouth every 6 (six) hours as needed for nausea or vomiting.    [provider]  rizatriptan (MAXALT) 10 MG tablet Take 10 mg by mouth as needed for migraine. May repeat in 2 hours if needed    [provider]  rOPINIRole (REQUIP) 2 MG tablet Take 2 mg by mouth at bedtime.    [provider]  topiramate (TOPAMAX) 100 MG tablet Take 200 mg by mouth daily.    [provider]  valACYclovir (VALTREX) 1000 MG tablet  07/14/16   [provider]    Family History  Family History  Problem Relation Age of Onset   Ovarian cancer Maternal Aunt 59   Uterine cancer Mother        63s   COPD Mother    Hypertension Mother    Uterine cancer Maternal Grandmother        ? age   Heart attack Father 99   Other Father        DDD    Social History Social History   Tobacco Use   Smoking status: Never   Smokeless tobacco: Never  Vaping Use   Vaping Use: Never used  Substance Use Topics   Alcohol use: Not Currently   Drug use: Never     Allergies   Hydrocodone-acetaminophen, Hydrocodone-acetaminophen, Morphine, Morphine and related, Cymbalta [duloxetine hcl], Doxycycline, Duloxetine, and Pregabalin   Review of Systems Review of Systems  Constitutional:  Positive for fatigue. Negative for chills, diaphoresis and fever.  HENT:  Positive for congestion, rhinorrhea and sore throat. Negative for ear pain, sinus pressure and sinus pain.   Respiratory:  Positive for cough. Negative for shortness of breath.   Gastrointestinal:  Negative for abdominal pain, nausea and vomiting.  Musculoskeletal:  Positive for myalgias.  Skin:  Negative for rash.  Neurological:  Positive for headaches. Negative for weakness.  Hematological:  Negative for adenopathy.     Physical Exam Triage Vital Signs ED Triage Vitals  Enc Vitals Group      BP 07/08/22 1047 113/86     Pulse Rate 07/08/22 1047 75     Resp --      Temp 07/08/22 1047 99 F (37.2 C)     Temp Source 07/08/22 1047 Oral     SpO2 07/08/22 1047 99 %     Weight 07/08/22 1046 160 lb (72.6 kg)     Height 07/08/22 1046 5' 2.5" (1.588 m)     Head Circumference --      Peak Flow --      Pain Score 07/08/22 1046 6     Pain Loc --      Pain Edu? --      Excl. in [place]? --    No data found.  Updated Vital Signs BP 113/86 (BP Location: Left Arm)   Pulse 75   Temp 99 F (37.2 C) (Oral)   Ht 5' 2.5" (1.588 m)   Wt 160 lb (72.6 kg)   SpO2 99%   BMI 28.80 kg/m     Physical Exam Vitals and nursing note reviewed.  Constitutional:      General: She is not in acute distress.    Appearance: Normal appearance. She is not ill-appearing or toxic-appearing.  HENT:     Head: Normocephalic and atraumatic.     Nose: Congestion present.     Mouth/Throat:     Mouth: Mucous membranes are moist.     Pharynx: Oropharynx is clear. Posterior oropharyngeal erythema present.  Eyes:     General: No scleral icterus.       Right eye: No discharge.        Left eye: No discharge.     Conjunctiva/sclera: Conjunctivae normal.  Cardiovascular:     Rate and Rhythm: Normal rate and regular rhythm.     Heart sounds: Normal heart sounds.  Pulmonary:     Effort: Pulmonary effort is normal. No respiratory distress.     Breath sounds: Normal breath sounds.  Musculoskeletal:     Cervical back: Neck supple.  Skin:    General: Skin is dry.  Neurological:     General: No focal deficit present.     Mental Status: She is alert. Mental status is at baseline.     Motor: No weakness.     Gait: Gait normal.  Psychiatric:        Mood and Affect: Mood normal.        Behavior: Behavior normal.        Thought Content: Thought content normal.      UC Treatments / Results  Labs (all labs ordered are listed, but only abnormal results are displayed) Labs Reviewed  SARS CORONAVIRUS 2 BY RT PCR     EKG   Radiology No results found.  Procedures Procedures (including critical care time)  Medications Ordered in UC Medications - No data to display  Initial Impression / Assessment and Plan / UC Course  I have reviewed the triage vital signs and the nursing notes.  Pertinent labs & imaging results that were available during my care of the patient were reviewed by me and considered in my medical decision making (see chart for details).   67 year old female presents for 3-day history of cough, congestion, fatigue, headaches and bodyaches.  Has been around her husband who tested positive for COVID-19 with a home test.  She has had 2 negative home test.  Vitals are normal and stable and she is overall well-appearing.  On exam she has mild nasal congestion and mild posterior pharyngeal erythema with clear postnasal drainage.  Chest clear to auscultation heart regular rate and rhythm.  PCR COVID test performed and negative.  Discussed result with patient.  Suspect viral illness.  Supportive care encouraged with increasing rest and fluids.  Sent Promethazine DM to pharmacy.  Reviewed returning if increased temperature, chest pain or shortness of breath.  She says she does have some concerns about pneumonia.  I offered to do an x-ray but she would like to hold off at this time and treat and monitor her symptoms.   Final Clinical Impressions(s) / UC Diagnoses   Final diagnoses:  Viral illness  Acute cough  Nasal congestion     Discharge Instructions      URI/COLD SYMPTOMS: Your exam today is consistent with a viral illness. Antibiotics are not indicated at this time. Use medications as directed, including cough syrup, nasal saline, and decongestants. Your symptoms should improve over the next few days and resolve within 7-10 days. Increase rest and fluids. F/u if symptoms worsen or predominate such as sore throat, ear pain, productive cough, shortness of breath, or if you develop  high fevers or worsening fatigue over the next several days.       ED Prescriptions     Medication Sig Dispense Auth. Provider   promethazine-dextromethorphan (PROMETHAZINE-DM) 6.25-15 MG/5ML syrup Take 5 mLs by mouth 4 (four) times daily as needed. 118 mL Danton Clap, PA-C      PDMP not reviewed this encounter.   Danton Clap, PA-C 07/08/22 1200

## 2022-07-12 ENCOUNTER — Telehealth: Payer: Self-pay

## 2022-07-12 ENCOUNTER — Encounter: Payer: Medicare PPO | Admitting: *Deleted

## 2022-07-12 NOTE — Telephone Encounter (Signed)
Left message for patient asking for callback for pulmonary rehab.

## 2022-07-13 ENCOUNTER — Other Ambulatory Visit: Payer: Self-pay | Admitting: Gastroenterology

## 2022-07-21 ENCOUNTER — Encounter: Payer: Medicare PPO | Attending: Pulmonary Disease | Admitting: *Deleted

## 2022-07-21 DIAGNOSIS — R0600 Dyspnea, unspecified: Secondary | ICD-10-CM | POA: Insufficient documentation

## 2022-07-26 ENCOUNTER — Encounter: Payer: Medicare PPO | Admitting: *Deleted

## 2022-07-26 DIAGNOSIS — R0600 Dyspnea, unspecified: Secondary | ICD-10-CM | POA: Diagnosis present

## 2022-07-26 NOTE — Progress Notes (Signed)
Daily Session Note  Patient Details  Name: Amy Huynh MRN: [id number] Date of Birth: 01/23/1956 Referring Provider:   Flowsheet Row Pulmonary Rehab from 04/27/2022 in [facility]  Referring Provider Dr Ottie Glazier       Encounter Date: 07/26/2022  Check In:  Session Check In - 07/26/22 1118       Check-In   Supervising physician immediately available to respond to emergencies See telemetry face sheet for immediately available ER MD    Location [facility]    Staff Present Earlean Shawl, BS, ACSM CEP, Exercise Physiologist;Meredith Sherryll Burger, RN Odelia Gage, RN, Dimple Nanas, BS, Exercise Physiologist    Virtual Visit No    Medication changes reported     Yes    Comments Started Prednisone and ABX    Fall or balance concerns reported    No    Warm-up and Cool-down Performed on first and last piece of equipment    Resistance Training Performed Yes    VAD Patient? No    PAD/SET Patient? No      Pain Assessment   Currently in Pain? No/denies                Social History   Tobacco Use  Smoking Status Never  Smokeless Tobacco Never    Goals Met:  Independence with exercise equipment Exercise tolerated well No report of concerns or symptoms today Strength training completed today  Goals Unmet:  Not Applicable  Comments: Pt able to follow exercise prescription today without complaint.  Will continue to monitor for progression.    Dr. Emily Filbert is Medical Director for [facility].  Dr. Ottie Glazier is Medical Director for [facility] Pulmonary Rehabilitation.

## 2022-07-28 ENCOUNTER — Ambulatory Visit: Payer: Medicare PPO | Admitting: *Deleted

## 2022-07-30 ENCOUNTER — Ambulatory Visit: Payer: Medicare PPO

## 2022-08-02 ENCOUNTER — Ambulatory Visit: Payer: Medicare PPO | Admitting: *Deleted

## 2022-08-04 ENCOUNTER — Ambulatory Visit: Payer: Medicare PPO | Admitting: *Deleted

## 2022-08-04 ENCOUNTER — Encounter: Payer: Self-pay | Admitting: *Deleted

## 2022-08-04 DIAGNOSIS — R0600 Dyspnea, unspecified: Secondary | ICD-10-CM

## 2022-08-04 NOTE — Progress Notes (Signed)
Pulmonary Individual Treatment Plan  Patient Details  Name: Amy Huynh MRN: [id number] Date of Birth: 1956/02/11 Referring Provider:   Flowsheet Row Pulmonary Rehab from 04/27/2022 in [facility] Cardiac and Pulmonary Rehab  Referring Provider Dr Ottie Glazier       Initial Encounter Date:  Flowsheet Row Pulmonary Rehab from 04/27/2022 in [facility] Cardiac and Pulmonary Rehab  Date 04/27/22       Visit Diagnosis: Dyspnea, unspecified type  Patient's Home Medications on Admission:  Current Outpatient Medications:    Apremilast (OTEZLA) 30 MG TABS, Take 30 mg by mouth 2 (two) times daily., Disp: , Rfl:    butalbital-acetaminophen-caffeine (FIORICET, ESGIC) 50-325-40 MG tablet, Take 1 tablet by mouth every 6 (six) hours as needed for headache., Disp: , Rfl:    cetirizine (ZYRTEC) 10 MG tablet, Take 10 mg by mouth daily., Disp: , Rfl:    clobetasol cream (TEMOVATE) 0.05 %, Apply topically., Disp: , Rfl:    cyanocobalamin 1000 MCG tablet, Take 1,000 mcg by mouth daily., Disp: , Rfl:    dexlansoprazole (DEXILANT) 60 MG capsule, TAKE 1 CAPSULE(60 MG) BY MOUTH DAILY, Disp: 90 capsule, Rfl: 2   Eptinezumab-jjmr (VYEPTI) 100 MG/ML injection, Inject 100 mg into the vein every 3 (three) months. Last Saturday, Disp: , Rfl:    fexofenadine (ALLEGRA) 180 MG tablet, Take 180 mg by mouth daily., Disp: , Rfl:    folic acid (FOLVITE) 1 MG tablet, Take 1 mg by mouth daily., Disp: , Rfl:    gabapentin (NEURONTIN) 300 MG capsule, Take 900 mg by mouth at bedtime., Disp: , Rfl:    hydrOXYzine (ATARAX/VISTARIL) 10 MG tablet, Take 10 mg by mouth every 8 (eight) hours as needed., Disp: , Rfl:    hyoscyamine (LEVSIN, ANASPAZ) 0.125 MG tablet, Take 0.125 mg by mouth every 4 (four) hours as needed., Disp: , Rfl:    methocarbamol (ROBAXIN) 500 MG tablet, Take 500 mg by mouth at bedtime. , Disp: , Rfl:    methotrexate (RHEUMATREX) 2.5 MG tablet, Take 20 mg by mouth once a week. Take 4 tablets (10 mg ) by  mouth in the morning and 4 tablets(10 mg by mouth in the evening on Saturdays (total= 20 mg), Disp: , Rfl:    ondansetron (ZOFRAN-ODT) 4 MG disintegrating tablet, Take 1 tablet (4 mg total) by mouth every 8 (eight) hours as needed for nausea or vomiting., Disp: 20 tablet, Rfl: 0   pilocarpine (SALAGEN) 5 MG tablet, Take 5 mg by mouth 3 (three) times daily., Disp: , Rfl:    promethazine (PHENERGAN) 25 MG tablet, Take 25 mg by mouth every 6 (six) hours as needed for nausea or vomiting., Disp: , Rfl:    promethazine-dextromethorphan (PROMETHAZINE-DM) 6.25-15 MG/5ML syrup, Take 5 mLs by mouth 4 (four) times daily as needed., Disp: 118 mL, Rfl: 0   rizatriptan (MAXALT) 10 MG tablet, Take 10 mg by mouth as needed for migraine. May repeat in 2 hours if needed, Disp: , Rfl:    rOPINIRole (REQUIP) 2 MG tablet, Take 2 mg by mouth at bedtime., Disp: , Rfl:    topiramate (TOPAMAX) 100 MG tablet, Take 200 mg by mouth daily., Disp: , Rfl:    valACYclovir (VALTREX) 1000 MG tablet, , Disp: , Rfl:   Past Medical History: Past Medical History:  Diagnosis Date   Arthritis    Family history of ovarian cancer 12/2016   MyRisk neg   Fibromyalgia    Migraine    Neuropathy    Placenta previa  Sjogren's disease ([place])     Tobacco Use: Social History   Tobacco Use  Smoking Status Never  Smokeless Tobacco Never    Labs: Review Flowsheet        No data to display           Pulmonary Assessment Scores:  Pulmonary Assessment Scores     Row Name 04/27/22 1425         ADL UCSD   ADL Phase Entry     SOB Score total 57     Rest 0     Walk 3     Stairs 5     Bath 3     Dress 2     Shop 4       CAT Score   CAT Score 18       mMRC Score   mMRC Score 1              UCSD: Self-administered rating of dyspnea associated with activities of daily living (ADLs) 6-point scale (0 = "not at all" to 5 = "maximal or unable to do because of breathlessness")  Scoring Scores range from 0 to  120.  Minimally important difference is 5 units  CAT: CAT can identify the health impairment of COPD patients and is better correlated with disease progression.  CAT has a scoring range of zero to 40. The CAT score is classified into four groups of low (less than 10), medium (10 - 20), high (21-30) and very high (31-40) based on the impact level of disease on health status. A CAT score over 10 suggests significant symptoms.  A worsening CAT score could be explained by an exacerbation, poor medication adherence, poor inhaler technique, or progression of COPD or comorbid conditions.  CAT MCID is 2 points  mMRC: mMRC (Modified Medical Research Council) Dyspnea Scale is used to assess the degree of baseline functional disability in patients of respiratory disease due to dyspnea. No minimal important difference is established. A decrease in score of 1 point or greater is considered a positive change.   Pulmonary Function Assessment:  Pulmonary Function Assessment - 04/26/22 1112       Breath   Shortness of Breath Yes;Limiting activity             Exercise Target Goals: Exercise Program Goal: Individual exercise prescription set using results from initial 6 min walk test and THRR while considering  patient's activity barriers and safety.   Exercise Prescription Goal: Initial exercise prescription builds to 30-45 minutes a day of aerobic activity, 2-3 days per week.  Home exercise guidelines will be given to patient during program as part of exercise prescription that the participant will acknowledge.  Education: Aerobic Exercise: - Group verbal and visual presentation on the components of exercise prescription. Introduces F.I.T.T principle from ACSM for exercise prescriptions.  Reviews F.I.T.T. principles of aerobic exercise including progression. Written material given at graduation.   Education: Resistance Exercise: - Group verbal and visual presentation on the components of exercise  prescription. Introduces F.I.T.T principle from ACSM for exercise prescriptions  Reviews F.I.T.T. principles of resistance exercise including progression. Written material given at graduation.    Education: Exercise & Equipment Safety: - Individual verbal instruction and demonstration of equipment use and safety with use of the equipment. Flowsheet Row Pulmonary Rehab from 06/16/2022 in [facility] Cardiac and Pulmonary Rehab  Date 04/26/22  Educator [facility]  Instruction Review Code 1- Verbalizes Understanding       Education: Exercise Physiology &  General Exercise Guidelines: - Group verbal and written instruction with models to review the exercise physiology of the cardiovascular system and associated critical values. Provides general exercise guidelines with specific guidelines to those with heart or lung disease.    Education: Flexibility, Balance, Mind/Body Relaxation: - Group verbal and visual presentation with interactive activity on the components of exercise prescription. Introduces F.I.T.T principle from ACSM for exercise prescriptions. Reviews F.I.T.T. principles of flexibility and balance exercise training including progression. Also discusses the mind body connection.  Reviews various relaxation techniques to help reduce and manage stress (i.e. Deep breathing, progressive muscle relaxation, and visualization). Balance handout provided to take home. Written material given at graduation. Flowsheet Row Pulmonary Rehab from 06/16/2022 in [facility] Cardiac and Pulmonary Rehab  Date 06/16/22  Educator [place]  Instruction Review Code 1- Verbalizes Understanding       Activity Barriers & Risk Stratification:  Activity Barriers & Cardiac Risk Stratification - 04/27/22 1438       Activity Barriers & Cardiac Risk Stratification   Activity Barriers Shortness of Breath;Left Knee Replacement;Other (comment);Back Problems    Comments Neuropathy in feet, Hip Pain             6 Minute Walk:  6 Minute  Walk     Row Name 04/27/22 1433         6 Minute Walk   Phase Initial     Distance 800 feet     Walk Time 6 minutes     # of Rest Breaks 0     MPH 1.52     METS 2.15     RPE 13     Perceived Dyspnea  2     VO2 Peak 7.52     Symptoms Yes (comment)     Comments SOB, Hip Pain 4/10     Resting HR 66 bpm     Resting BP 116/62     Resting Oxygen Saturation  98 %     Exercise Oxygen Saturation  during 6 min walk 96 %     Max Ex. HR 96 bpm     Max Ex. BP 124/66     2 Minute Post BP 104/64       Interval HR   1 Minute HR 96     2 Minute HR 96     3 Minute HR 96     4 Minute HR 94     5 Minute HR 94     6 Minute HR 90     2 Minute Post HR 69     Interval Heart Rate? Yes       Interval Oxygen   Interval Oxygen? Yes     Baseline Oxygen Saturation % 98 %     1 Minute Oxygen Saturation % 98 %     1 Minute Liters of Oxygen 0 L  RA     2 Minute Oxygen Saturation % 97 %     2 Minute Liters of Oxygen 0 L  RA     3 Minute Oxygen Saturation % 96 %     3 Minute Liters of Oxygen 0 L  RA     4 Minute Oxygen Saturation % 98 %     4 Minute Liters of Oxygen 0 L  RA     5 Minute Oxygen Saturation % 99 %     5 Minute Liters of Oxygen 0 L  RA     6 Minute Oxygen Saturation % 98 %  6 Minute Liters of Oxygen 0 L  RA     2 Minute Post Oxygen Saturation % 99 %     2 Minute Post Liters of Oxygen 0 L  RA             Oxygen Initial Assessment:  Oxygen Initial Assessment - 04/26/22 1112       Home Oxygen   Home Oxygen Device None    Sleep Oxygen Prescription None    Home Exercise Oxygen Prescription None    Home Resting Oxygen Prescription None      Initial 6 min Walk   Oxygen Used None      Program Oxygen Prescription   Program Oxygen Prescription None      Intervention   Short Term Goals To learn and demonstrate proper pursed lip breathing techniques or other breathing techniques. ;To learn and understand importance of monitoring SPO2 with pulse oximeter and demonstrate  accurate use of the pulse oximeter.;To learn and understand importance of maintaining oxygen saturations>88%;To learn and exhibit compliance with exercise, home and travel O2 prescription    Long  Term Goals Exhibits compliance with exercise, home  and travel O2 prescription;Maintenance of O2 saturations>88%;Demonstrates proper use of MDI's;Exhibits proper breathing techniques, such as pursed lip breathing or other method taught during program session;Verbalizes importance of monitoring SPO2 with pulse oximeter and return demonstration             Oxygen Re-Evaluation:  Oxygen Re-Evaluation     Row Name 05/03/22 1102 05/19/22 1127 05/28/22 1103 06/16/22 1152 06/30/22 1107     Program Oxygen Prescription   Program Oxygen Prescription -- None None None None     Home Oxygen   Home Oxygen Device -- None None None None   Sleep Oxygen Prescription -- None None None None   Home Exercise Oxygen Prescription -- None None None None   Home Resting Oxygen Prescription -- None None None None     Goals/Expected Outcomes   Short Term Goals -- To learn and understand importance of maintaining oxygen saturations>88%;To learn and understand importance of monitoring SPO2 with pulse oximeter and demonstrate accurate use of the pulse oximeter. To learn and demonstrate proper pursed lip breathing techniques or other breathing techniques.  To learn and demonstrate proper pursed lip breathing techniques or other breathing techniques.  To learn and demonstrate proper use of respiratory medications   Long  Term Goals -- Verbalizes importance of monitoring SPO2 with pulse oximeter and return demonstration;Maintenance of O2 saturations>88% Exhibits proper breathing techniques, such as pursed lip breathing or other method taught during program session Exhibits proper breathing techniques, such as pursed lip breathing or other method taught during program session Compliance with respiratory medication   Comments  Reviewed PLB technique with pt.  Talked about how it works and it's importance in maintaining their exercise saturations. Jamina has a pulse oximeter to check her oxygen saturation at home. Informed and explained why it is important to have one. Reviewed that oxygen saturations should be 88 percent and above. Patient verbalizes understanding Informed patient how to perform the Pursed Lipped breathing technique. Told patient to Inhale through the nose and out the mouth with pursed lips to keep their airways open, help oxygenate them better, practice when at rest or doing strenuous activity. Patient Verbalizes understanding of technique and will work on and be reiterated during [place]. Diaphragmatic and PLB breathing explained and performed with patient. Patient has a better understanding of how to do these exercises to help  with breathing performance and relaxation. Patient performed breathing techniques adequately and to practice further at home. Zevaeh states she has been using her breathing techniques to help her shortness of breath. Her shortness of breath has been better since the start of the program. She has an albuterol inhaler that she uses as needed. She has not needed to use her inhaler in a long times.   Goals/Expected Outcomes Short: Become more profiecient at using PLB. Long: Become independent at using PLB. Short: monitor oxygen at home with exertion. Long: maintain oxygen saturations above 88 percent independently. Short: use PLB with exertion. Long: use PLB on exertion proficiently and independently. Short: practice PLB and diaphragmatic breathing at home. Long: Use PLB and diaphragmatic breathing independently post [facility]. Short: continue working on breathing techniques. Long: maintain breathing techniques independently.            Oxygen Discharge (Final Oxygen Re-Evaluation):  Oxygen Re-Evaluation - 06/30/22 1107       Program Oxygen Prescription   Program Oxygen Prescription  None      Home Oxygen   Home Oxygen Device None    Sleep Oxygen Prescription None    Home Exercise Oxygen Prescription None    Home Resting Oxygen Prescription None      Goals/Expected Outcomes   Short Term Goals To learn and demonstrate proper use of respiratory medications    Long  Term Goals Compliance with respiratory medication    Comments Litia states she has been using her breathing techniques to help her shortness of breath. Her shortness of breath has been better since the start of the program. She has an albuterol inhaler that she uses as needed. She has not needed to use her inhaler in a long times.    Goals/Expected Outcomes Short: continue working on breathing techniques. Long: maintain breathing techniques independently.             Initial Exercise Prescription:  Initial Exercise Prescription - 04/27/22 1400       Date of Initial Exercise RX and Referring Provider   Date 04/27/22    Referring Provider Dr Ottie Glazier      Oxygen   Maintain Oxygen Saturation 88% or higher      Recumbant Bike   Level 1    RPM 50    Watts 15    Minutes 15    METs 2.15      NuStep   Level 2    SPM 80    Minutes 15    METs 2.15      REL-XR   Level 1    Watts 15    Speed 50    Minutes 15    METs 2.15      Track   Laps 20    Minutes 15    METs 2.09      Prescription Details   Frequency (times per week) 3    Duration Progress to 30 minutes of continuous aerobic without signs/symptoms of physical distress      Intensity   THRR 40-80% of Max Heartrate 102-138    Ratings of Perceived Exertion 11-13    Perceived Dyspnea 0-4      Progression   Progression Continue to progress workloads to maintain intensity without signs/symptoms of physical distress.      Resistance Training   Training Prescription Yes    Weight 2 lb    Reps 10-15             Perform Capillary Blood  Glucose checks as needed.  Exercise Prescription Changes:   Exercise Prescription  Changes     Row Name 04/27/22 1400 05/13/22 0900 05/24/22 1400 06/07/22 1400 06/21/22 1400     Response to Exercise   Blood Pressure (Admit) 116/62 112/64 120/64 124/62 110/60   Blood Pressure (Exercise) 124/66 138/72 128/68 140/70 128/68   Blood Pressure (Exit) 104/64 126/62 124/62 112/64 116/66   Heart Rate (Admit) 66 bpm 68 bpm 71 bpm 77 bpm 68 bpm   Heart Rate (Exercise) 96 bpm 85 bpm 106 bpm 106 bpm 97 bpm   Heart Rate (Exit) 69 bpm 77 bpm 86 bpm 87 bpm 79 bpm   Oxygen Saturation (Admit) 98 % 98 % 96 % 98 % 98 %   Oxygen Saturation (Exercise) 96 % 98 % 93 % 96 % 93 %   Oxygen Saturation (Exit) 99 % 97 % 98 % 96 % 99 %   Rating of Perceived Exertion (Exercise) 13 15 13 14 15    Perceived Dyspnea (Exercise) 2 2 1 2 3    Symptoms SOB Hip Pain SOB SOB SOB SOB   Comments 6MWT Results 2nd full day of exercise -- -- --   Duration -- Progress to 30 minutes of  aerobic without signs/symptoms of physical distress Continue with 30 min of aerobic exercise without signs/symptoms of physical distress. Continue with 30 min of aerobic exercise without signs/symptoms of physical distress. Continue with 30 min of aerobic exercise without signs/symptoms of physical distress.   Intensity -- THRR unchanged THRR unchanged THRR unchanged THRR unchanged     Progression   Progression -- Continue to progress workloads to maintain intensity without signs/symptoms of physical distress. Continue to progress workloads to maintain intensity without signs/symptoms of physical distress. Continue to progress workloads to maintain intensity without signs/symptoms of physical distress. Continue to progress workloads to maintain intensity without signs/symptoms of physical distress.   Average METs -- 2.53 2.86 2.98 3.07     Resistance Training   Training Prescription -- Yes Yes Yes Yes   Weight -- 2 lb 2 lb 2 lb 2 lb   Reps -- 10-15 10-15 10-15 10-15     Interval Training   Interval Training -- No No No No      Recumbant Bike   Level -- 1 2 2  2.5   Watts -- -- -- 15 18   Minutes -- 15 15 15 15    METs -- 2.1 3.1 2.63 2.9     NuStep   Level -- 2 4 -- 4   Minutes -- -- 15 -- 15   METs -- 2.3 3 -- 3.3     Arm Ergometer   Level -- -- -- -- 1   Minutes -- -- -- -- 15   METs -- -- -- -- 1     REL-XR   Level -- 1 1 4 4    Minutes -- 15 15 15 15    METs -- 3.2 2.7 3.5 3.8     T5 Nustep   Level -- -- -- 2 --   SPM -- -- -- 80 --   Minutes -- -- -- 15 --   METs -- -- -- 2.1 --     Oxygen   Maintain Oxygen Saturation -- 88% or higher 88% or higher 88% or higher 88% or higher    Row Name 07/05/22 1200             Response to Exercise   Blood Pressure (Admit) 102/62  Blood Pressure (Exercise) 162/60       Blood Pressure (Exit) 110/62       Heart Rate (Admit) 66 bpm       Heart Rate (Exercise) 114 bpm       Heart Rate (Exit) 71 bpm       Oxygen Saturation (Admit) 98 %       Oxygen Saturation (Exercise) 95 %       Oxygen Saturation (Exit) 98 %       Rating of Perceived Exertion (Exercise) 15       Symptoms SOB       Duration Continue with 30 min of aerobic exercise without signs/symptoms of physical distress.       Intensity THRR unchanged         Progression   Progression Continue to progress workloads to maintain intensity without signs/symptoms of physical distress.       Average METs 3.1         Resistance Training   Training Prescription Yes       Weight 2 lb       Reps 10-15         Interval Training   Interval Training No         Arm Ergometer   Level 1       Minutes 15       METs 2         REL-XR   Level 5       Minutes 15       METs 4.2         Oxygen   Maintain Oxygen Saturation 88% or higher                Exercise Comments:   Exercise Comments     Row Name 05/03/22 1100           Exercise Comments First full day of exercise!  Patient was oriented to gym and equipment including functions, settings, policies, and procedures.  Patient's  individual exercise prescription and treatment plan were reviewed.  All starting workloads were established based on the results of the 6 minute walk test done at initial orientation visit.  The plan for exercise progression was also introduced and progression will be customized based on patient's performance and goals.                Exercise Goals and Review:   Exercise Goals     Row Name 04/27/22 1449             Exercise Goals   Increase Physical Activity Yes       Intervention Provide advice, education, support and counseling about physical activity/exercise needs.;Develop an individualized exercise prescription for aerobic and resistive training based on initial evaluation findings, risk stratification, comorbidities and participant's personal goals.       Expected Outcomes Short Term: Attend rehab on a regular basis to increase amount of physical activity.;Long Term: Add in home exercise to make exercise part of routine and to increase amount of physical activity.;Long Term: Exercising regularly at least 3-5 days a week.       Increase Strength and Stamina Yes       Intervention Provide advice, education, support and counseling about physical activity/exercise needs.;Develop an individualized exercise prescription for aerobic and resistive training based on initial evaluation findings, risk stratification, comorbidities and participant's personal goals.       Expected Outcomes Short Term: Perform resistance training exercises routinely during rehab  and add in resistance training at home;Short Term: Increase workloads from initial exercise prescription for resistance, speed, and METs.;Long Term: Improve cardiorespiratory fitness, muscular endurance and strength as measured by increased METs and functional capacity (6MWT)       Able to understand and use rate of perceived exertion (RPE) scale Yes       Intervention Provide education and explanation on how to use RPE scale        Expected Outcomes Short Term: Able to use RPE daily in rehab to express subjective intensity level;Long Term:  Able to use RPE to guide intensity level when exercising independently       Able to understand and use Dyspnea scale Yes       Intervention Provide education and explanation on how to use Dyspnea scale       Expected Outcomes Long Term: Able to use Dyspnea scale to guide intensity level when exercising independently;Short Term: Able to use Dyspnea scale daily in rehab to express subjective sense of shortness of breath during exertion       Knowledge and understanding of Target Heart Rate Range (THRR) Yes       Intervention Provide education and explanation of THRR including how the numbers were predicted and where they are located for reference       Expected Outcomes Short Term: Able to state/look up THRR;Long Term: Able to use THRR to govern intensity when exercising independently;Short Term: Able to use daily as guideline for intensity in rehab       Able to check pulse independently Yes       Intervention Provide education and demonstration on how to check pulse in carotid and radial arteries.;Review the importance of being able to check your own pulse for safety during independent exercise       Expected Outcomes Short Term: Able to explain why pulse checking is important during independent exercise;Long Term: Able to check pulse independently and accurately       Understanding of Exercise Prescription Yes       Intervention Provide education, explanation, and written materials on patient's individual exercise prescription       Expected Outcomes Short Term: Able to explain program exercise prescription;Long Term: Able to explain home exercise prescription to exercise independently                Exercise Goals Re-Evaluation :  Exercise Goals Re-Evaluation     Row Name 05/03/22 1100 05/13/22 0915 05/24/22 1432 06/07/22 1437 06/21/22 1424     Exercise Goal Re-Evaluation    Exercise Goals Review Able to understand and use rate of perceived exertion (RPE) scale;Able to understand and use Dyspnea scale;Knowledge and understanding of Target Heart Rate Range (THRR);Understanding of Exercise Prescription Increase Physical Activity;Understanding of Exercise Prescription;Increase Strength and Stamina Increase Physical Activity;Understanding of Exercise Prescription;Increase Strength and Stamina Increase Physical Activity;Understanding of Exercise Prescription;Increase Strength and Stamina Increase Physical Activity;Understanding of Exercise Prescription;Increase Strength and Stamina   Comments Reviewed RPE scale, THR and program prescription with pt today.  Pt voiced understanding and was given a copy of goals to take home. Zelta has completed her first two full days of exercise thus far.  She has done well with initial workloads.  We will conintue to monitor her progress. Aleysia is doing well in rehab. She recently improved her overall average MET level to 2.86 METs. She also was able to improve to level 2 on the recumbent bike and level 4 on the T4. She has not  done any walking on the track yet, but will encourage her to start. We will continue to monitor her progress. Tevin continues to do well in rehab. She increased to level 4 on the XR and tried out the T5 Nustep and was able to exercise at a level 2. She has been hitting her THR each session. She could benefit from increasing to 3 lb handweights. Will continue to monitor. Alandra continues to do well in the program. She has not done any walking recently, but did increase her overall average MET level to 3.07 METs. She also began using the arm crank at level 1 and did well. She has continued to use 2 lb hand weigths fro resistance training as well. We will continue to monitor her progress.   Expected Outcomes Short: Use RPE daily to regulate intensity. Long: Follow program prescription in THR. Short: Attend rehab regularly Long: Continue  to improve stamina Short: Begin walking on the track. Long: Continue to improve strength and stamina. Short: Increase handweights to 3 lb Long: Continue to increase overall stamina and MET level Short: Increase handweights to 3 lb, return to walking. Long: Continue to improve strength and stamina.    [place] Name 07/05/22 1205 07/19/22 1437           Exercise Goal Re-Evaluation   Exercise Goals Review Increase Physical Activity;Understanding of Exercise Prescription;Increase Strength and Stamina Increase Physical Activity;Understanding of Exercise Prescription;Increase Strength and Stamina      Comments Shauntelle has been inconsistent with her attendance at rehab. Staff will talk to patient to reiterate attendance policy to yield good results. She did, however, increase to level 5 on the XR the 1 session she was here from last review. We will continue to monitor. Tanaysia has not attended rehab since the last review. We attempted to call patient and left a message requesting her to call back to pulmonary rehab. We will continue to monitor her progress once she returns to rehab.      Expected Outcomes Short: Maintain good attendance Long: Continue to increase overall MET level and stamina Short: Return to pulmonary rehab. Long: Continue to improve strength and stamina.               Discharge Exercise Prescription (Final Exercise Prescription Changes):  Exercise Prescription Changes - 07/05/22 1200       Response to Exercise   Blood Pressure (Admit) 102/62    Blood Pressure (Exercise) 162/60    Blood Pressure (Exit) 110/62    Heart Rate (Admit) 66 bpm    Heart Rate (Exercise) 114 bpm    Heart Rate (Exit) 71 bpm    Oxygen Saturation (Admit) 98 %    Oxygen Saturation (Exercise) 95 %    Oxygen Saturation (Exit) 98 %    Rating of Perceived Exertion (Exercise) 15    Symptoms SOB    Duration Continue with 30 min of aerobic exercise without signs/symptoms of physical distress.    Intensity THRR  unchanged      Progression   Progression Continue to progress workloads to maintain intensity without signs/symptoms of physical distress.    Average METs 3.1      Resistance Training   Training Prescription Yes    Weight 2 lb    Reps 10-15      Interval Training   Interval Training No      Arm Ergometer   Level 1    Minutes 15    METs 2      REL-XR  Level 5    Minutes 15    METs 4.2      Oxygen   Maintain Oxygen Saturation 88% or higher             Nutrition:  Target Goals: Understanding of nutrition guidelines, daily intake of sodium 1500mg , cholesterol 200mg , calories 30% from fat and 7% or less from saturated fats, daily to have 5 or more servings of fruits and vegetables.  Education: All About Nutrition: -Group instruction provided by verbal, written material, interactive activities, discussions, models, and posters to present general guidelines for heart healthy nutrition including fat, fiber, MyPlate, the role of sodium in heart healthy nutrition, utilization of the nutrition label, and utilization of this knowledge for meal planning. Follow up email sent as well. Written material given at graduation.   Biometrics:  Pre Biometrics - 04/27/22 1449       Pre Biometrics   Height 5\' 4"  (1.626 m)    Weight 166 lb 11.2 oz (75.6 kg)    Waist Circumference 34.5 inches    Hip Circumference 40.5 inches    Waist to Hip Ratio 0.85 %    BMI (Calculated) 28.6    Single Leg Stand 2.4 seconds   Left Leg             Nutrition Therapy Plan and Nutrition Goals:  Nutrition Therapy & Goals - 04/27/22 1028       Nutrition Therapy   Diet Heart healthy, low Na, pulmonary MNT    Protein (specify units) 90-95g    Fiber 25 grams    Whole Grain Foods 3 servings    Saturated Fats 12 max. grams    Fruits and Vegetables 8 servings/day    Sodium 1.5 grams      Personal Nutrition Goals   Nutrition Goal ST: practice MyPlate guidelines, include 2-3 snacks with fiber,  protein, and heart healthy fat  LT: follow MyPlate guidelines, limit Na <1.5g/day, eat a variety of fruits and vegetables    Comments 67 y.o. F admitted to pulmonary rehab s/p dyspnea. PMHx includes HTN, migraine, CKD stg 3a, Sjogrens syndrome. Relevant medication includes dexilant, folic acid, pilocarpine. Margory reports having abdominal surgery to rotate her bowel back to normal position. She is having pain from a new hernia due to surgery, but the MD reports She reports losing 12 lbs in the last 7 weeks due to loss of appetite. She reports having irregular eating habits now  such as snacking or rice pudding, oatmeal, 1 meal per day includes pork, chicken, starchy vegetables and some non-starchy vegetables (she doesn't like most leafy greens or cruciferous vegetables). She reports going out to eat more now as she has not been cooking much at home since the surgery. Discussed sodium concerns with eating out and encouraged to get back into the habit of eating more foods at home. Clark reports having a hard time recalling foods at this time. Drinks: water. Discussed general healthy eating and pulmnary MNT. discussed including snacks during the day that include protein, fiber, and healthy fats to boost her nutrition and to ensure she is meeting her energy and protein needs to avoid losing msucle tissue. Some snacks may inlcude things like fruit and peanut butter or greek yogurt.      Intervention Plan   Intervention Prescribe, educate and counsel regarding individualized specific dietary modifications aiming towards targeted core components such as weight, hypertension, lipid management, diabetes, heart failure and other comorbidities.;Nutrition handout(s) given to patient.    Expected Outcomes  Short Term Goal: A plan has been developed with personal nutrition goals set during dietitian appointment.;Short Term Goal: Understand basic principles of dietary content, such as calories, fat, sodium, cholesterol and  nutrients.;Long Term Goal: Adherence to prescribed nutrition plan.             Nutrition Assessments:  MEDIFICTS Score Key: ?70 Need to make dietary changes  40-70 Heart Healthy Diet ? 40 Therapeutic Level Cholesterol Diet  Flowsheet Row Pulmonary Rehab from 04/27/2022 in [facility] Cardiac and Pulmonary Rehab  Picture Your Plate Total Score on Admission 62      Picture Your Plate Scores: [id number] Unhealthy dietary pattern with much room for improvement. 41-50 Dietary pattern unlikely to meet recommendations for good health and room for improvement. 51-60 More healthful dietary pattern, with some room for improvement.  >60 Healthy dietary pattern, although there may be some specific behaviors that could be improved.   Nutrition Goals Re-Evaluation:  Nutrition Goals Re-Evaluation     [place] Name 05/19/22 1128 05/28/22 1105 06/30/22 1109         Goals   Current Weight -- 165 lb (74.8 kg) 165 lb (74.8 kg)     Nutrition Goal -- Eat more fruits and Vegetables Work on appetite     Comment Patient was informed on why it is important to maintain a balanced diet when dealing with Respiratory issues. Explained that it takes a lot of energy to breath and when they are short of breath often they will need to have a good diet to help keep up with the calories they are expending for breathing. Haliey wants to try to eat healthier. There are some vegetables that she does not like. She likes fruit, red peppers and leans protiens. Her husband is a diabetic and she wants to eat healthy for him to. Laqueisha states that her diet is going well but has not had that much of an appetite. Her appetite has been lower due to her stomach surgery back in October. She does not feel as hungy and lose about 20 pounds since the surgery.     Expected Outcome Short: Choose and plan snacks accordingly to patients caloric intake to improve breathing. Long: Maintain a diet independently that meets their caloric intake to aid in daily  shortness of breath. Short: eat more vegetables. Long: adhere to a diet that pertains to her. Short: eat smaller more frequent meals. Long: maintain a diet that can keep up with her appetite.              Nutrition Goals Discharge (Final Nutrition Goals Re-Evaluation):  Nutrition Goals Re-Evaluation - 06/30/22 1109       Goals   Current Weight 165 lb (74.8 kg)    Nutrition Goal Work on appetite    Comment Laquida states that her diet is going well but has not had that much of an appetite. Her appetite has been lower due to her stomach surgery back in October. She does not feel as hungy and lose about 20 pounds since the surgery.    Expected Outcome Short: eat smaller more frequent meals. Long: maintain a diet that can keep up with her appetite.             Psychosocial: Target Goals: Acknowledge presence or absence of significant depression and/or stress, maximize coping skills, provide positive support system. Participant is able to verbalize types and ability to use techniques and skills needed for reducing stress and depression.   Education: Stress, Anxiety, and Depression -  Group verbal and visual presentation to define topics covered.  Reviews how body is impacted by stress, anxiety, and depression.  Also discusses healthy ways to reduce stress and to treat/manage anxiety and depression.  Written material given at graduation.   Education: Sleep Hygiene -Provides group verbal and written instruction about how sleep can affect your health.  Define sleep hygiene, discuss sleep cycles and impact of sleep habits. Review good sleep hygiene tips.    Initial Review & Psychosocial Screening:  Initial Psych Review & Screening - 04/26/22 1114       Initial Review   Current issues with Current Sleep Concerns;Current Stress Concerns    Source of Stress Concerns Chronic Illness      Family Dynamics   Good Support System? Yes    Comments Tameaka states that she does not sleep very  well. She wakes up every couple hours or so. She can look to her family for support and has great support from the rest of her family. Tunesia may have to go back for more abnominal surgery due to a hernia which creates some stress.      Barriers   Psychosocial barriers to participate in program The patient should benefit from training in stress management and relaxation.;There are no identifiable barriers or psychosocial needs.      Screening Interventions   Interventions Encouraged to exercise;To provide support and resources with identified psychosocial needs;Provide feedback about the scores to participant    Expected Outcomes Short Term goal: Utilizing psychosocial counselor, staff and physician to assist with identification of specific Stressors or current issues interfering with healing process. Setting desired goal for each stressor or current issue identified.;Long Term Goal: Stressors or current issues are controlled or eliminated.;Short Term goal: Identification and review with participant of any Quality of Life or Depression concerns found by scoring the questionnaire.;Long Term goal: The participant improves quality of Life and PHQ9 Scores as seen by post scores and/or verbalization of changes             Quality of Life Scores:  Scores of 19 and below usually indicate a poorer quality of life in these areas.  A difference of  2-3 points is a clinically meaningful difference.  A difference of 2-3 points in the total score of the Quality of Life Index has been associated with significant improvement in overall quality of life, self-image, physical symptoms, and general health in studies assessing change in quality of life.  PHQ-9: Review Flowsheet       06/30/2022 05/28/2022 04/27/2022  Depression screen PHQ 2/9  Decreased Interest 0 0 1  Down, Depressed, Hopeless 0 0 1  PHQ - 2 Score 0 0 2  Altered sleeping 3 3 3   Tired, decreased energy 3 3 3   Change in appetite 2 2 3    Feeling bad or failure about yourself  0 0 1  Trouble concentrating 0 0 0  Moving slowly or fidgety/restless 0 0 0  Suicidal thoughts 0 0 0  PHQ-9 Score 8 8 12   Difficult doing work/chores Not difficult at all Not difficult at all Very difficult   Interpretation of Total Score  Total Score Depression Severity:  1-4 = Minimal depression, 5-9 = Mild depression, 10-14 = Moderate depression, 15-19 = Moderately severe depression, 20-27 = Severe depression   Psychosocial Evaluation and Intervention:  Psychosocial Evaluation - 04/26/22 1116       Psychosocial Evaluation & Interventions   Interventions Encouraged to exercise with the program and follow exercise  prescription;Relaxation education;Stress management education    Comments Shandreka states that she does not sleep very well. She wakes up every couple hours or so. She can look to her family for support and has great support from the rest of her family.    Expected Outcomes Short: Start [facility] to help with mood. Long: Maintain a healthy mental state.    Continue Psychosocial Services  Follow up required by staff             Psychosocial Re-Evaluation:  Psychosocial Re-Evaluation     [place] Name 05/28/22 1111 06/30/22 1113           Psychosocial Re-Evaluation   Current issues with None Identified Current Sleep Concerns      Comments Reviewed patient health questionnaire (PHQ-9) with patient for follow up. Previously, patients score indicated signs/symptoms of depression.  Reviewed to see if patient is improving symptom wise while in program.  Score improved and patient states that it is because she has been trying to improve her health more. Reviewed patient health questionnaire (PHQ-9) with patient for follow up. Previously, patients score indicated signs/symptoms of depression.  Reviewed to see if patient is improving symptom wise while in program.  Score stayed the same and patient states that it is because her energy and sleep  have been low and the same as last evaluation.      Expected Outcomes Short: Continue to attend [facility] regularly for regular exercise and social engagement. Long: Continue to improve symptoms and manage a positive mental state. Short: Continue to attend [facility] regularly for regular exercise and social engagement. Long: Continue to improve symptoms and manage a positive mental state.      Interventions Encouraged to attend Pulmonary Rehabilitation for the exercise Encouraged to attend Pulmonary Rehabilitation for the exercise      Continue Psychosocial Services  Follow up required by staff Follow up required by staff               Psychosocial Discharge (Final Psychosocial Re-Evaluation):  Psychosocial Re-Evaluation - 06/30/22 1113       Psychosocial Re-Evaluation   Current issues with Current Sleep Concerns    Comments Reviewed patient health questionnaire (PHQ-9) with patient for follow up. Previously, patients score indicated signs/symptoms of depression.  Reviewed to see if patient is improving symptom wise while in program.  Score stayed the same and patient states that it is because her energy and sleep have been low and the same as last evaluation.    Expected Outcomes Short: Continue to attend [facility] regularly for regular exercise and social engagement. Long: Continue to improve symptoms and manage a positive mental state.    Interventions Encouraged to attend Pulmonary Rehabilitation for the exercise    Continue Psychosocial Services  Follow up required by staff             Education: Education Goals: Education classes will be provided on a weekly basis, covering required topics. Participant will state understanding/return demonstration of topics presented.  Learning Barriers/Preferences:  Learning Barriers/Preferences - 04/26/22 1113       Learning Barriers/Preferences   Learning Barriers None    Learning Preferences None              General Pulmonary Education Topics:  Infection Prevention: - Provides verbal and written material to individual with discussion of infection control including proper hand washing and proper equipment cleaning during exercise session. Flowsheet Row Pulmonary Rehab from 06/16/2022 in [facility] Cardiac and Pulmonary Rehab  Date  04/26/22  Educator [place]  Instruction Review Code 1- Verbalizes Understanding       Falls Prevention: - Provides verbal and written material to individual with discussion of falls prevention and safety. Flowsheet Row Pulmonary Rehab from 06/16/2022 in [facility] Cardiac and Pulmonary Rehab  Date 04/26/22  Educator [facility]  Instruction Review Code 1- Verbalizes Understanding       Chronic Lung Disease Review: - Group verbal instruction with posters, models, PowerPoint presentations and videos,  to review new updates, new respiratory medications, new advancements in procedures and treatments. Providing information on websites and "800" numbers for continued self-education. Includes information about supplement oxygen, available portable oxygen systems, continuous and intermittent flow rates, oxygen safety, concentrators, and Medicare reimbursement for oxygen. Explanation of Pulmonary Drugs, including class, frequency, complications, importance of spacers, rinsing mouth after steroid MDI's, and proper cleaning methods for nebulizers. Review of basic lung anatomy and physiology related to function, structure, and complications of lung disease. Review of risk factors. Discussion about methods for diagnosing sleep apnea and types of masks and machines for OSA. Includes a review of the use of types of environmental controls: home humidity, furnaces, filters, dust mite/pet prevention, HEPA vacuums. Discussion about weather changes, air quality and the benefits of nasal washing. Instruction on Warning signs, infection symptoms, calling MD promptly, preventive modes, and value of  vaccinations. Review of effective airway clearance, coughing and/or vibration techniques. Emphasizing that all should Create an Action Plan. Written material given at graduation. Flowsheet Row Pulmonary Rehab from 06/16/2022 in [facility] Cardiac and Pulmonary Rehab  Education need identified 04/27/22  Date 05/12/22  Educator [facility]  Instruction Review Code 1- Verbalizes Understanding       AED/CPR: - Group verbal and written instruction with the use of models to demonstrate the basic use of the AED with the basic ABC's of resuscitation.    Anatomy and Cardiac Procedures: - Group verbal and visual presentation and models provide information about basic cardiac anatomy and function. Reviews the testing methods done to diagnose heart disease and the outcomes of the test results. Describes the treatment choices: Medical Management, Angioplasty, or Coronary Bypass Surgery for treating various heart conditions including Myocardial Infarction, Angina, Valve Disease, and Cardiac Arrhythmias.  Written material given at graduation.   Medication Safety: - Group verbal and visual instruction to review commonly prescribed medications for heart and lung disease. Reviews the medication, class of the drug, and side effects. Includes the steps to properly store meds and maintain the prescription regimen.  Written material given at graduation.   Other: -Provides group and verbal instruction on various topics (see comments)   Knowledge Questionnaire Score:  Knowledge Questionnaire Score - 04/27/22 1422       Knowledge Questionnaire Score   Pre Score 16/18              Core Components/Risk Factors/Patient Goals at Admission:  Personal Goals and Risk Factors at Admission - 04/27/22 1424       Core Components/Risk Factors/Patient Goals on Admission    Weight Management Yes;Weight Loss    Intervention Weight Management: Develop a combined nutrition and exercise program designed to reach desired caloric  intake, while maintaining appropriate intake of nutrient and fiber, sodium and fats, and appropriate energy expenditure required for the weight goal.;Weight Management: Provide education and appropriate resources to help participant work on and attain dietary goals.;Weight Management/Obesity: Establish reasonable short term and long term weight goals.    Admit Weight 166 lb 11.2 oz (75.6 kg)  Goal Weight: Short Term 160 lb (72.6 kg)    Goal Weight: Long Term 155 lb (70.3 kg)    Expected Outcomes Short Term: Continue to assess and modify interventions until short term weight is achieved;Long Term: Adherence to nutrition and physical activity/exercise program aimed toward attainment of established weight goal;Weight Loss: Understanding of general recommendations for a balanced deficit meal plan, which promotes 1-2 lb weight loss per week and includes a negative energy balance of [id number] kcal/d;Understanding recommendations for meals to include 15-35% energy as protein, 25-35% energy from fat, 35-60% energy from carbohydrates, less than 200mg  of dietary cholesterol, 20-35 gm of total fiber daily;Understanding of distribution of calorie intake throughout the day with the consumption of 4-5 meals/snacks    Improve shortness of breath with ADL's Yes    Intervention Provide education, individualized exercise plan and daily activity instruction to help decrease symptoms of SOB with activities of daily living.    Expected Outcomes Short Term: Improve cardiorespiratory fitness to achieve a reduction of symptoms when performing ADLs;Long Term: Be able to perform more ADLs without symptoms or delay the onset of symptoms    Hypertension Yes    Intervention Provide education on lifestyle modifcations including regular physical activity/exercise, weight management, moderate sodium restriction and increased consumption of fresh fruit, vegetables, and low fat dairy, alcohol moderation, and smoking cessation.;Monitor  prescription use compliance.    Expected Outcomes Short Term: Continued assessment and intervention until BP is < 140/55mm HG in hypertensive participants. < 130/78mm HG in hypertensive participants with diabetes, heart failure or chronic kidney disease.;Long Term: Maintenance of blood pressure at goal levels.             Education:Diabetes - Individual verbal and written instruction to review signs/symptoms of diabetes, desired ranges of glucose level fasting, after meals and with exercise. Acknowledge that pre and post exercise glucose checks will be done for 3 sessions at entry of program.   Know Your Numbers and Heart Failure: - Group verbal and visual instruction to discuss disease risk factors for cardiac and pulmonary disease and treatment options.  Reviews associated critical values for Overweight/Obesity, Hypertension, Cholesterol, and Diabetes.  Discusses basics of heart failure: signs/symptoms and treatments.  Introduces Heart Failure Zone chart for action plan for heart failure.  Written material given at graduation.   Core Components/Risk Factors/Patient Goals Review:   Goals and Risk Factor Review     Row Name 05/19/22 1126 05/28/22 1108 06/30/22 1115         Core Components/Risk Factors/Patient Goals Review   Personal Goals Review Improve shortness of breath with ADL's Weight Management/Obesity Hypertension     Review Spoke to patient about their shortness of breath and what they can do to improve. Patient has been informed of breathing techniques when starting the program. Patient is informed to tell staff if they have had any med changes and that certain meds they are taking or not taking can be causing shortness of breath. September has lost 15 pounds after her abdominal surgery from a bowel blockage. She is trying to eat healthier and lose more weight. Her weight goal is 150 pounds. Nekayla has had good blood pressure reading in the program. Sometimes she is in the lower 0000000  systolic. She is sometimes dizzy when her pressure gets low but is able to manage it. Her doctor is aware of her lower blood pressure readings. She checks her pressure at home daily.     Expected Outcomes Short: Attend [facility] regularly to improve  shortness of breath with ADL's. Long: maintain independence with ADL's Short: lose a few pounds in the next few weeks. Long; reach weight of 150 pounds. Short: check blood pressure readings at home. Long: maintain blood pressure readings independently.              Core Components/Risk Factors/Patient Goals at Discharge (Final Review):   Goals and Risk Factor Review - 06/30/22 1115       Core Components/Risk Factors/Patient Goals Review   Personal Goals Review Hypertension    Review Ineshia has had good blood pressure reading in the program. Sometimes she is in the lower 0000000 systolic. She is sometimes dizzy when her pressure gets low but is able to manage it. Her doctor is aware of her lower blood pressure readings. She checks her pressure at home daily.    Expected Outcomes Short: check blood pressure readings at home. Long: maintain blood pressure readings independently.             ITP Comments:  ITP Comments     Row Name 04/26/22 1111 04/27/22 1421 05/03/22 1100 05/12/22 0948 06/09/22 0859   ITP Comments Virtual Visit completed. Patient informed on EP and RD appointment and 6 Minute walk test. Patient also informed of patient health questionnaires on My Chart. Patient Verbalizes understanding. Visit diagnosis can be found in [facility] 04/12/2022. Completed 6MWT and gym orientation. Initial ITP created and sent for review to Dr. Ottie Glazier, Medical Director. First full day of exercise!  Patient was oriented to gym and equipment including functions, settings, policies, and procedures.  Patient's individual exercise prescription and treatment plan were reviewed.  All starting workloads were established based on the results of the 6 minute walk  test done at initial orientation visit.  The plan for exercise progression was also introduced and progression will be customized based on patient's performance and goals. 30 Day review completed. Medical Director ITP review done, changes made as directed, and signed approval by Medical Director.    new to program 30 Day review completed. Medical Director ITP review done, changes made as directed, and signed approval by Medical Director.    [place] Name 07/07/22 0800 08/04/22 0751         ITP Comments 30 day review completed. ITP sent to Dr. Zetta Bills, Medical Director of  Pulmonary Rehab. Continue with ITP unless changes are made by physician. 30 Day review completed. Medical Director ITP review done, changes made as directed, and signed approval by Medical Director.               Comments:

## 2022-08-06 ENCOUNTER — Ambulatory Visit: Payer: Medicare PPO

## 2022-08-09 ENCOUNTER — Ambulatory Visit: Payer: Medicare PPO

## 2022-08-11 ENCOUNTER — Ambulatory Visit: Payer: Medicare PPO

## 2022-08-13 ENCOUNTER — Ambulatory Visit: Payer: Medicare PPO

## 2022-08-16 ENCOUNTER — Ambulatory Visit: Payer: Medicare PPO

## 2022-08-18 ENCOUNTER — Ambulatory Visit: Payer: Medicare PPO

## 2022-08-20 ENCOUNTER — Ambulatory Visit: Payer: Medicare PPO

## 2022-08-23 ENCOUNTER — Ambulatory Visit: Payer: Medicare PPO | Admitting: *Deleted

## 2022-08-25 ENCOUNTER — Ambulatory Visit: Payer: Medicare PPO | Admitting: *Deleted

## 2022-08-27 ENCOUNTER — Ambulatory Visit: Payer: Medicare PPO

## 2022-08-30 ENCOUNTER — Ambulatory Visit: Payer: Medicare PPO | Admitting: *Deleted

## 2022-09-01 ENCOUNTER — Telehealth: Payer: Self-pay

## 2022-09-01 ENCOUNTER — Ambulatory Visit: Payer: Medicare PPO | Admitting: *Deleted

## 2022-09-01 ENCOUNTER — Encounter: Payer: Self-pay | Admitting: *Deleted

## 2022-09-01 DIAGNOSIS — R0600 Dyspnea, unspecified: Secondary | ICD-10-CM

## 2022-09-01 NOTE — Progress Notes (Signed)
Pulmonary Individual Treatment Plan  Patient Details  Name: Amy Huynh MRN: [id number] Date of Birth: 1956/02/11 Referring Provider:   Flowsheet Row Pulmonary Rehab from 04/27/2022 in [facility] Cardiac and Pulmonary Rehab  Referring Provider Dr Ottie Glazier       Initial Encounter Date:  Flowsheet Row Pulmonary Rehab from 04/27/2022 in [facility] Cardiac and Pulmonary Rehab  Date 04/27/22       Visit Diagnosis: Dyspnea, unspecified type  Patient's Home Medications on Admission:  Current Outpatient Medications:    Apremilast (OTEZLA) 30 MG TABS, Take 30 mg by mouth 2 (two) times daily., Disp: , Rfl:    butalbital-acetaminophen-caffeine (FIORICET, ESGIC) 50-325-40 MG tablet, Take 1 tablet by mouth every 6 (six) hours as needed for headache., Disp: , Rfl:    cetirizine (ZYRTEC) 10 MG tablet, Take 10 mg by mouth daily., Disp: , Rfl:    clobetasol cream (TEMOVATE) 0.05 %, Apply topically., Disp: , Rfl:    cyanocobalamin 1000 MCG tablet, Take 1,000 mcg by mouth daily., Disp: , Rfl:    dexlansoprazole (DEXILANT) 60 MG capsule, TAKE 1 CAPSULE(60 MG) BY MOUTH DAILY, Disp: 90 capsule, Rfl: 2   Eptinezumab-jjmr (VYEPTI) 100 MG/ML injection, Inject 100 mg into the vein every 3 (three) months. Last Saturday, Disp: , Rfl:    fexofenadine (ALLEGRA) 180 MG tablet, Take 180 mg by mouth daily., Disp: , Rfl:    folic acid (FOLVITE) 1 MG tablet, Take 1 mg by mouth daily., Disp: , Rfl:    gabapentin (NEURONTIN) 300 MG capsule, Take 900 mg by mouth at bedtime., Disp: , Rfl:    hydrOXYzine (ATARAX/VISTARIL) 10 MG tablet, Take 10 mg by mouth every 8 (eight) hours as needed., Disp: , Rfl:    hyoscyamine (LEVSIN, ANASPAZ) 0.125 MG tablet, Take 0.125 mg by mouth every 4 (four) hours as needed., Disp: , Rfl:    methocarbamol (ROBAXIN) 500 MG tablet, Take 500 mg by mouth at bedtime. , Disp: , Rfl:    methotrexate (RHEUMATREX) 2.5 MG tablet, Take 20 mg by mouth once a week. Take 4 tablets (10 mg ) by  mouth in the morning and 4 tablets(10 mg by mouth in the evening on Saturdays (total= 20 mg), Disp: , Rfl:    ondansetron (ZOFRAN-ODT) 4 MG disintegrating tablet, Take 1 tablet (4 mg total) by mouth every 8 (eight) hours as needed for nausea or vomiting., Disp: 20 tablet, Rfl: 0   pilocarpine (SALAGEN) 5 MG tablet, Take 5 mg by mouth 3 (three) times daily., Disp: , Rfl:    promethazine (PHENERGAN) 25 MG tablet, Take 25 mg by mouth every 6 (six) hours as needed for nausea or vomiting., Disp: , Rfl:    promethazine-dextromethorphan (PROMETHAZINE-DM) 6.25-15 MG/5ML syrup, Take 5 mLs by mouth 4 (four) times daily as needed., Disp: 118 mL, Rfl: 0   rizatriptan (MAXALT) 10 MG tablet, Take 10 mg by mouth as needed for migraine. May repeat in 2 hours if needed, Disp: , Rfl:    rOPINIRole (REQUIP) 2 MG tablet, Take 2 mg by mouth at bedtime., Disp: , Rfl:    topiramate (TOPAMAX) 100 MG tablet, Take 200 mg by mouth daily., Disp: , Rfl:    valACYclovir (VALTREX) 1000 MG tablet, , Disp: , Rfl:   Past Medical History: Past Medical History:  Diagnosis Date   Arthritis    Family history of ovarian cancer 12/2016   MyRisk neg   Fibromyalgia    Migraine    Neuropathy    Placenta previa  Sjogren's disease ([place])     Tobacco Use: Social History   Tobacco Use  Smoking Status Never  Smokeless Tobacco Never    Labs: Review Flowsheet        No data to display           Pulmonary Assessment Scores:  Pulmonary Assessment Scores     Row Name 04/27/22 1425         ADL UCSD   ADL Phase Entry     SOB Score total 57     Rest 0     Walk 3     Stairs 5     Bath 3     Dress 2     Shop 4       CAT Score   CAT Score 18       mMRC Score   mMRC Score 1              UCSD: Self-administered rating of dyspnea associated with activities of daily living (ADLs) 6-point scale (0 = "not at all" to 5 = "maximal or unable to do because of breathlessness")  Scoring Scores range from 0 to  120.  Minimally important difference is 5 units  CAT: CAT can identify the health impairment of COPD patients and is better correlated with disease progression.  CAT has a scoring range of zero to 40. The CAT score is classified into four groups of low (less than 10), medium (10 - 20), high (21-30) and very high (31-40) based on the impact level of disease on health status. A CAT score over 10 suggests significant symptoms.  A worsening CAT score could be explained by an exacerbation, poor medication adherence, poor inhaler technique, or progression of COPD or comorbid conditions.  CAT MCID is 2 points  mMRC: mMRC (Modified Medical Research Council) Dyspnea Scale is used to assess the degree of baseline functional disability in patients of respiratory disease due to dyspnea. No minimal important difference is established. A decrease in score of 1 point or greater is considered a positive change.   Pulmonary Function Assessment:  Pulmonary Function Assessment - 04/26/22 1112       Breath   Shortness of Breath Yes;Limiting activity             Exercise Target Goals: Exercise Program Goal: Individual exercise prescription set using results from initial 6 min walk test and THRR while considering  patient's activity barriers and safety.   Exercise Prescription Goal: Initial exercise prescription builds to 30-45 minutes a day of aerobic activity, 2-3 days per week.  Home exercise guidelines will be given to patient during program as part of exercise prescription that the participant will acknowledge.  Education: Aerobic Exercise: - Group verbal and visual presentation on the components of exercise prescription. Introduces F.I.T.T principle from ACSM for exercise prescriptions.  Reviews F.I.T.T. principles of aerobic exercise including progression. Written material given at graduation.   Education: Resistance Exercise: - Group verbal and visual presentation on the components of exercise  prescription. Introduces F.I.T.T principle from ACSM for exercise prescriptions  Reviews F.I.T.T. principles of resistance exercise including progression. Written material given at graduation.    Education: Exercise & Equipment Safety: - Individual verbal instruction and demonstration of equipment use and safety with use of the equipment. Flowsheet Row Pulmonary Rehab from 06/16/2022 in [facility] Cardiac and Pulmonary Rehab  Date 04/26/22  Educator [facility]  Instruction Review Code 1- Verbalizes Understanding       Education: Exercise Physiology &  General Exercise Guidelines: - Group verbal and written instruction with models to review the exercise physiology of the cardiovascular system and associated critical values. Provides general exercise guidelines with specific guidelines to those with heart or lung disease.    Education: Flexibility, Balance, Mind/Body Relaxation: - Group verbal and visual presentation with interactive activity on the components of exercise prescription. Introduces F.I.T.T principle from ACSM for exercise prescriptions. Reviews F.I.T.T. principles of flexibility and balance exercise training including progression. Also discusses the mind body connection.  Reviews various relaxation techniques to help reduce and manage stress (i.e. Deep breathing, progressive muscle relaxation, and visualization). Balance handout provided to take home. Written material given at graduation. Flowsheet Row Pulmonary Rehab from 06/16/2022 in [facility] Cardiac and Pulmonary Rehab  Date 06/16/22  Educator [place]  Instruction Review Code 1- Verbalizes Understanding       Activity Barriers & Risk Stratification:  Activity Barriers & Cardiac Risk Stratification - 04/27/22 1438       Activity Barriers & Cardiac Risk Stratification   Activity Barriers Shortness of Breath;Left Knee Replacement;Other (comment);Back Problems    Comments Neuropathy in feet, Hip Pain             6 Minute Walk:  6 Minute  Walk     Row Name 04/27/22 1433         6 Minute Walk   Phase Initial     Distance 800 feet     Walk Time 6 minutes     # of Rest Breaks 0     MPH 1.52     METS 2.15     RPE 13     Perceived Dyspnea  2     VO2 Peak 7.52     Symptoms Yes (comment)     Comments SOB, Hip Pain 4/10     Resting HR 66 bpm     Resting BP 116/62     Resting Oxygen Saturation  98 %     Exercise Oxygen Saturation  during 6 min walk 96 %     Max Ex. HR 96 bpm     Max Ex. BP 124/66     2 Minute Post BP 104/64       Interval HR   1 Minute HR 96     2 Minute HR 96     3 Minute HR 96     4 Minute HR 94     5 Minute HR 94     6 Minute HR 90     2 Minute Post HR 69     Interval Heart Rate? Yes       Interval Oxygen   Interval Oxygen? Yes     Baseline Oxygen Saturation % 98 %     1 Minute Oxygen Saturation % 98 %     1 Minute Liters of Oxygen 0 L  RA     2 Minute Oxygen Saturation % 97 %     2 Minute Liters of Oxygen 0 L  RA     3 Minute Oxygen Saturation % 96 %     3 Minute Liters of Oxygen 0 L  RA     4 Minute Oxygen Saturation % 98 %     4 Minute Liters of Oxygen 0 L  RA     5 Minute Oxygen Saturation % 99 %     5 Minute Liters of Oxygen 0 L  RA     6 Minute Oxygen Saturation % 98 %  6 Minute Liters of Oxygen 0 L  RA     2 Minute Post Oxygen Saturation % 99 %     2 Minute Post Liters of Oxygen 0 L  RA             Oxygen Initial Assessment:  Oxygen Initial Assessment - 04/26/22 1112       Home Oxygen   Home Oxygen Device None    Sleep Oxygen Prescription None    Home Exercise Oxygen Prescription None    Home Resting Oxygen Prescription None      Initial 6 min Walk   Oxygen Used None      Program Oxygen Prescription   Program Oxygen Prescription None      Intervention   Short Term Goals To learn and demonstrate proper pursed lip breathing techniques or other breathing techniques. ;To learn and understand importance of monitoring SPO2 with pulse oximeter and demonstrate  accurate use of the pulse oximeter.;To learn and understand importance of maintaining oxygen saturations>88%;To learn and exhibit compliance with exercise, home and travel O2 prescription    Long  Term Goals Exhibits compliance with exercise, home  and travel O2 prescription;Maintenance of O2 saturations>88%;Demonstrates proper use of MDI's;Exhibits proper breathing techniques, such as pursed lip breathing or other method taught during program session;Verbalizes importance of monitoring SPO2 with pulse oximeter and return demonstration             Oxygen Re-Evaluation:  Oxygen Re-Evaluation     Row Name 05/03/22 1102 05/19/22 1127 05/28/22 1103 06/16/22 1152 06/30/22 1107     Program Oxygen Prescription   Program Oxygen Prescription -- None None None None     Home Oxygen   Home Oxygen Device -- None None None None   Sleep Oxygen Prescription -- None None None None   Home Exercise Oxygen Prescription -- None None None None   Home Resting Oxygen Prescription -- None None None None     Goals/Expected Outcomes   Short Term Goals -- To learn and understand importance of maintaining oxygen saturations>88%;To learn and understand importance of monitoring SPO2 with pulse oximeter and demonstrate accurate use of the pulse oximeter. To learn and demonstrate proper pursed lip breathing techniques or other breathing techniques.  To learn and demonstrate proper pursed lip breathing techniques or other breathing techniques.  To learn and demonstrate proper use of respiratory medications   Long  Term Goals -- Verbalizes importance of monitoring SPO2 with pulse oximeter and return demonstration;Maintenance of O2 saturations>88% Exhibits proper breathing techniques, such as pursed lip breathing or other method taught during program session Exhibits proper breathing techniques, such as pursed lip breathing or other method taught during program session Compliance with respiratory medication   Comments  Reviewed PLB technique with pt.  Talked about how it works and it's importance in maintaining their exercise saturations. Jamina has a pulse oximeter to check her oxygen saturation at home. Informed and explained why it is important to have one. Reviewed that oxygen saturations should be 88 percent and above. Patient verbalizes understanding Informed patient how to perform the Pursed Lipped breathing technique. Told patient to Inhale through the nose and out the mouth with pursed lips to keep their airways open, help oxygenate them better, practice when at rest or doing strenuous activity. Patient Verbalizes understanding of technique and will work on and be reiterated during [place]. Diaphragmatic and PLB breathing explained and performed with patient. Patient has a better understanding of how to do these exercises to help  with breathing performance and relaxation. Patient performed breathing techniques adequately and to practice further at home. Zevaeh states she has been using her breathing techniques to help her shortness of breath. Her shortness of breath has been better since the start of the program. She has an albuterol inhaler that she uses as needed. She has not needed to use her inhaler in a long times.   Goals/Expected Outcomes Short: Become more profiecient at using PLB. Long: Become independent at using PLB. Short: monitor oxygen at home with exertion. Long: maintain oxygen saturations above 88 percent independently. Short: use PLB with exertion. Long: use PLB on exertion proficiently and independently. Short: practice PLB and diaphragmatic breathing at home. Long: Use PLB and diaphragmatic breathing independently post [facility]. Short: continue working on breathing techniques. Long: maintain breathing techniques independently.            Oxygen Discharge (Final Oxygen Re-Evaluation):  Oxygen Re-Evaluation - 06/30/22 1107       Program Oxygen Prescription   Program Oxygen Prescription  None      Home Oxygen   Home Oxygen Device None    Sleep Oxygen Prescription None    Home Exercise Oxygen Prescription None    Home Resting Oxygen Prescription None      Goals/Expected Outcomes   Short Term Goals To learn and demonstrate proper use of respiratory medications    Long  Term Goals Compliance with respiratory medication    Comments Litia states she has been using her breathing techniques to help her shortness of breath. Her shortness of breath has been better since the start of the program. She has an albuterol inhaler that she uses as needed. She has not needed to use her inhaler in a long times.    Goals/Expected Outcomes Short: continue working on breathing techniques. Long: maintain breathing techniques independently.             Initial Exercise Prescription:  Initial Exercise Prescription - 04/27/22 1400       Date of Initial Exercise RX and Referring Provider   Date 04/27/22    Referring Provider Dr Ottie Glazier      Oxygen   Maintain Oxygen Saturation 88% or higher      Recumbant Bike   Level 1    RPM 50    Watts 15    Minutes 15    METs 2.15      NuStep   Level 2    SPM 80    Minutes 15    METs 2.15      REL-XR   Level 1    Watts 15    Speed 50    Minutes 15    METs 2.15      Track   Laps 20    Minutes 15    METs 2.09      Prescription Details   Frequency (times per week) 3    Duration Progress to 30 minutes of continuous aerobic without signs/symptoms of physical distress      Intensity   THRR 40-80% of Max Heartrate 102-138    Ratings of Perceived Exertion 11-13    Perceived Dyspnea 0-4      Progression   Progression Continue to progress workloads to maintain intensity without signs/symptoms of physical distress.      Resistance Training   Training Prescription Yes    Weight 2 lb    Reps 10-15             Perform Capillary Blood  Glucose checks as needed.  Exercise Prescription Changes:   Exercise Prescription  Changes     Row Name 04/27/22 1400 05/13/22 0900 05/24/22 1400 06/07/22 1400 06/21/22 1400     Response to Exercise   Blood Pressure (Admit) 116/62 112/64 120/64 124/62 110/60   Blood Pressure (Exercise) 124/66 138/72 128/68 140/70 128/68   Blood Pressure (Exit) 104/64 126/62 124/62 112/64 116/66   Heart Rate (Admit) 66 bpm 68 bpm 71 bpm 77 bpm 68 bpm   Heart Rate (Exercise) 96 bpm 85 bpm 106 bpm 106 bpm 97 bpm   Heart Rate (Exit) 69 bpm 77 bpm 86 bpm 87 bpm 79 bpm   Oxygen Saturation (Admit) 98 % 98 % 96 % 98 % 98 %   Oxygen Saturation (Exercise) 96 % 98 % 93 % 96 % 93 %   Oxygen Saturation (Exit) 99 % 97 % 98 % 96 % 99 %   Rating of Perceived Exertion (Exercise) 13 15 13 14 15    Perceived Dyspnea (Exercise) 2 2 1 2 3    Symptoms SOB Hip Pain SOB SOB SOB SOB   Comments Results 2nd full day of exercise -- -- --   Duration -- Progress to 30 minutes of  aerobic without signs/symptoms of physical distress Continue with 30 min of aerobic exercise without signs/symptoms of physical distress. Continue with 30 min of aerobic exercise without signs/symptoms of physical distress. Continue with 30 min of aerobic exercise without signs/symptoms of physical distress.   Intensity -- THRR unchanged THRR unchanged THRR unchanged THRR unchanged     Progression   Progression -- Continue to progress workloads to maintain intensity without signs/symptoms of physical distress. Continue to progress workloads to maintain intensity without signs/symptoms of physical distress. Continue to progress workloads to maintain intensity without signs/symptoms of physical distress. Continue to progress workloads to maintain intensity without signs/symptoms of physical distress.   Average METs -- 2.53 2.86 2.98 3.07     Resistance Training   Training Prescription -- Yes Yes Yes Yes   Weight -- 2 lb 2 lb 2 lb 2 lb   Reps -- 10-15 10-15 10-15 10-15     Interval Training   Interval Training -- No No No No      Recumbant Bike   Level -- 1 2 2  2.5   Watts -- -- -- 15 18   Minutes -- 15 15 15 15    METs -- 2.1 3.1 2.63 2.9     NuStep   Level -- 2 4 -- 4   Minutes -- -- 15 -- 15   METs -- 2.3 3 -- 3.3     Arm Ergometer   Level -- -- -- -- 1   Minutes -- -- -- -- 15   METs -- -- -- -- 1     REL-XR   Level -- 1 1 4 4    Minutes -- 15 15 15 15    METs -- 3.2 2.7 3.5 3.8     T5 Nustep   Level -- -- -- 2 --   SPM -- -- -- 80 --   Minutes -- -- -- 15 --   METs -- -- -- 2.1 --     Oxygen   Maintain Oxygen Saturation -- 88% or higher 88% or higher 88% or higher 88% or higher    Row Name 07/05/22 1200 08/18/22 1300           Response to Exercise   Blood Pressure (Admit) 102/62 126/64  Blood Pressure (Exercise) 162/60 136/70      Blood Pressure (Exit) 110/62 106/60      Heart Rate (Admit) 66 bpm 86 bpm      Heart Rate (Exercise) 114 bpm 98 bpm      Heart Rate (Exit) 71 bpm 79 bpm      Oxygen Saturation (Admit) 98 % 98 %      Oxygen Saturation (Exercise) 95 % 94 %      Oxygen Saturation (Exit) 98 % 97 %      Rating of Perceived Exertion (Exercise) 15 14      Perceived Dyspnea (Exercise) -- 1      Symptoms SOB SOB      Duration Continue with 30 min of aerobic exercise without signs/symptoms of physical distress. Continue with 30 min of aerobic exercise without signs/symptoms of physical distress.      Intensity THRR unchanged THRR unchanged        Progression   Progression Continue to progress workloads to maintain intensity without signs/symptoms of physical distress. Continue to progress workloads to maintain intensity without signs/symptoms of physical distress.      Average METs 3.1 2.94        Resistance Training   Training Prescription Yes Yes      Weight 2 lb 2 lb      Reps 10-15 10-15        Interval Training   Interval Training No No        Recumbant Bike   Level -- 1      Watts -- 18      Minutes -- 15      METs -- 2.77        NuStep   Level -- 4       Minutes -- 15      METs -- 3.1        Arm Ergometer   Level 1 --      Minutes 15 --      METs 2 --        REL-XR   Level 5 --      Minutes 15 --      METs 4.2 --        Oxygen   Maintain Oxygen Saturation 88% or higher 88% or higher               Exercise Comments:   Exercise Comments     Row Name 05/03/22 1100           Exercise Comments First full day of exercise!  Patient was oriented to gym and equipment including functions, settings, policies, and procedures.  Patient's individual exercise prescription and treatment plan were reviewed.  All starting workloads were established based on the results of the 6 minute walk test done at initial orientation visit.  The plan for exercise progression was also introduced and progression will be customized based on patient's performance and goals.                Exercise Goals and Review:   Exercise Goals     Row Name 04/27/22 1449             Exercise Goals   Increase Physical Activity Yes       Intervention Provide advice, education, support and counseling about physical activity/exercise needs.;Develop an individualized exercise prescription for aerobic and resistive training based on initial evaluation findings, risk stratification, comorbidities and participant's personal goals.  Expected Outcomes Short Term: Attend rehab on a regular basis to increase amount of physical activity.;Long Term: Add in home exercise to make exercise part of routine and to increase amount of physical activity.;Long Term: Exercising regularly at least 3-5 days a week.       Increase Strength and Stamina Yes       Intervention Provide advice, education, support and counseling about physical activity/exercise needs.;Develop an individualized exercise prescription for aerobic and resistive training based on initial evaluation findings, risk stratification, comorbidities and participant's personal goals.       Expected Outcomes Short  Term: Perform resistance training exercises routinely during rehab and add in resistance training at home;Short Term: Increase workloads from initial exercise prescription for resistance, speed, and METs.;Long Term: Improve cardiorespiratory fitness, muscular endurance and strength as measured by increased METs and functional capacity ( )       Able to understand and use rate of perceived exertion (RPE) scale Yes       Intervention Provide education and explanation on how to use RPE scale       Expected Outcomes Short Term: Able to use RPE daily in rehab to express subjective intensity level;Long Term:  Able to use RPE to guide intensity level when exercising independently       Able to understand and use Dyspnea scale Yes       Intervention Provide education and explanation on how to use Dyspnea scale       Expected Outcomes Long Term: Able to use Dyspnea scale to guide intensity level when exercising independently;Short Term: Able to use Dyspnea scale daily in rehab to express subjective sense of shortness of breath during exertion       Knowledge and understanding of Target Heart Rate Range (THRR) Yes       Intervention Provide education and explanation of THRR including how the numbers were predicted and where they are located for reference       Expected Outcomes Short Term: Able to state/look up THRR;Long Term: Able to use THRR to govern intensity when exercising independently;Short Term: Able to use daily as guideline for intensity in rehab       Able to check pulse independently Yes       Intervention Provide education and demonstration on how to check pulse in carotid and radial arteries.;Review the importance of being able to check your own pulse for safety during independent exercise       Expected Outcomes Short Term: Able to explain why pulse checking is important during independent exercise;Long Term: Able to check pulse independently and accurately       Understanding of Exercise  Prescription Yes       Intervention Provide education, explanation, and written materials on patient's individual exercise prescription       Expected Outcomes Short Term: Able to explain program exercise prescription;Long Term: Able to explain home exercise prescription to exercise independently                Exercise Goals Re-Evaluation :  Exercise Goals Re-Evaluation     Row Name 05/03/22 1100 05/13/22 0915 05/24/22 1432 06/07/22 1437 06/21/22 1424     Exercise Goal Re-Evaluation   Exercise Goals Review Able to understand and use rate of perceived exertion (RPE) scale;Able to understand and use Dyspnea scale;Knowledge and understanding of Target Heart Rate Range (THRR);Understanding of Exercise Prescription Increase Physical Activity;Understanding of Exercise Prescription;Increase Strength and Stamina Increase Physical Activity;Understanding of Exercise Prescription;Increase Strength and Stamina Increase Physical Activity;Understanding  of Exercise Prescription;Increase Strength and Stamina Increase Physical Activity;Understanding of Exercise Prescription;Increase Strength and Stamina   Comments Reviewed RPE scale, THR and program prescription with pt today.  Pt voiced understanding and was given a copy of goals to take home. Calypso has completed her first two full days of exercise thus far.  She has done well with initial workloads.  We will conintue to monitor her progress. Latresa is doing well in rehab. She recently improved her overall average MET level to 2.86 METs. She also was able to improve to level 2 on the recumbent bike and level 4 on the T4. She has not done any walking on the track yet, but will encourage her to start. We will continue to monitor her progress. Enora continues to do well in rehab. She increased to level 4 on the XR and tried out the T5 Nustep and was able to exercise at a level 2. She has been hitting her THR each session. She could benefit from increasing to 3 lb  handweights. Will continue to monitor. Eileen continues to do well in the program. She has not done any walking recently, but did increase her overall average MET level to 3.07 METs. She also began using the arm crank at level 1 and did well. She has continued to use 2 lb hand weigths fro resistance training as well. We will continue to monitor her progress.   Expected Outcomes Short: Use RPE daily to regulate intensity. Long: Follow program prescription in THR. Short: Attend rehab regularly Long: Continue to improve stamina Short: Begin walking on the track. Long: Continue to improve strength and stamina. Short: Increase handweights to 3 lb Long: Continue to increase overall stamina and MET level Short: Increase handweights to 3 lb, return to walking. Long: Continue to improve strength and stamina.    Row Name 07/05/22 1205 07/19/22 1437 08/18/22 1358         Exercise Goal Re-Evaluation   Exercise Goals Review Increase Physical Activity;Understanding of Exercise Prescription;Increase Strength and Stamina Increase Physical Activity;Understanding of Exercise Prescription;Increase Strength and Stamina Increase Physical Activity;Understanding of Exercise Prescription;Increase Strength and Stamina     Comments Icis has been inconsistent with her attendance at rehab. Staff will talk to patient to reiterate attendance policy to yield good results. She did, however, increase to level 5 on the XR the 1 session she was here from last review. We will continue to monitor. Zeba has not attended rehab since the last review. We attempted to call patient and left a message requesting her to call back to pulmonary rehab. We will continue to monitor her progress once she returns to rehab. Kaleesi returned to rehab for one session after being out due to her having COVID. During her one session back she was able to work at level 4 on the T4 nustep and level 1 on the recumbent bike. She has not attended rehab since 3/11, due to  her continuing to struggle with being sick. We will continue to monitor her progress once she returns to rehab.     Expected Outcomes Short: Maintain good attendance Long: Continue to increase overall MET level and stamina Short: Return to pulmonary rehab. Long: Continue to improve strength and stamina. Short: Return to pulmonary rehab. Long: Continue to improve strength and stamina.              Discharge Exercise Prescription (Final Exercise Prescription Changes):  Exercise Prescription Changes - 08/18/22 1300       Response to  Exercise   Blood Pressure (Admit) 126/64    Blood Pressure (Exercise) 136/70    Blood Pressure (Exit) 106/60    Heart Rate (Admit) 86 bpm    Heart Rate (Exercise) 98 bpm    Heart Rate (Exit) 79 bpm    Oxygen Saturation (Admit) 98 %    Oxygen Saturation (Exercise) 94 %    Oxygen Saturation (Exit) 97 %    Rating of Perceived Exertion (Exercise) 14    Perceived Dyspnea (Exercise) 1    Symptoms SOB    Duration Continue with 30 min of aerobic exercise without signs/symptoms of physical distress.    Intensity THRR unchanged      Progression   Progression Continue to progress workloads to maintain intensity without signs/symptoms of physical distress.    Average METs 2.94      Resistance Training   Training Prescription Yes    Weight 2 lb    Reps 10-15      Interval Training   Interval Training No      Recumbant Bike   Level 1    Watts 18    Minutes 15    METs 2.77      NuStep   Level 4    Minutes 15    METs 3.1      Oxygen   Maintain Oxygen Saturation 88% or higher             Nutrition:  Target Goals: Understanding of nutrition guidelines, daily intake of sodium 1500mg , cholesterol 200mg , calories 30% from fat and 7% or less from saturated fats, daily to have 5 or more servings of fruits and vegetables.  Education: All About Nutrition: -Group instruction provided by verbal, written material, interactive activities, discussions,  models, and posters to present general guidelines for heart healthy nutrition including fat, fiber, MyPlate, the role of sodium in heart healthy nutrition, utilization of the nutrition label, and utilization of this knowledge for meal planning. Follow up email sent as well. Written material given at graduation.   Biometrics:  Pre Biometrics - 04/27/22 1449       Pre Biometrics   Height 5\' 4"  (1.626 m)    Weight 166 lb 11.2 oz (75.6 kg)    Waist Circumference 34.5 inches    Hip Circumference 40.5 inches    Waist to Hip Ratio 0.85 %    BMI (Calculated) 28.6    Single Leg Stand 2.4 seconds   Left Leg             Nutrition Therapy Plan and Nutrition Goals:  Nutrition Therapy & Goals - 04/27/22 1028       Nutrition Therapy   Diet Heart healthy, low Na, pulmonary MNT    Protein (specify units) 90-95g    Fiber 25 grams    Whole Grain Foods 3 servings    Saturated Fats 12 max. grams    Fruits and Vegetables 8 servings/day    Sodium 1.5 grams      Personal Nutrition Goals   Nutrition Goal ST: practice MyPlate guidelines, include 2-3 snacks with fiber, protein, and heart healthy fat  LT: follow MyPlate guidelines, limit Na <1.5g/day, eat a variety of fruits and vegetables    Comments 67 y.o. F admitted to pulmonary rehab s/p dyspnea. PMHx includes HTN, migraine, CKD stg 3a, Sjogrens syndrome. Relevant medication includes dexilant, folic acid, pilocarpine. Callyn reports having abdominal surgery to rotate her bowel back to normal position. She is having pain from a new hernia due to  surgery, but the MD reports She reports losing 12 lbs in the last 7 weeks due to loss of appetite. She reports having irregular eating habits now  such as snacking or rice pudding, oatmeal, 1 meal per day includes pork, chicken, starchy vegetables and some non-starchy vegetables (she doesn't like most leafy greens or cruciferous vegetables). She reports going out to eat more now as she has not been cooking much  at home since the surgery. Discussed sodium concerns with eating out and encouraged to get back into the habit of eating more foods at home. Akita reports having a hard time recalling foods at this time. Drinks: water. Discussed general healthy eating and pulmnary MNT. discussed including snacks during the day that include protein, fiber, and healthy fats to boost her nutrition and to ensure she is meeting her energy and protein needs to avoid losing msucle tissue. Some snacks may inlcude things like fruit and peanut butter or greek yogurt.      Intervention Plan   Intervention Prescribe, educate and counsel regarding individualized specific dietary modifications aiming towards targeted core components such as weight, hypertension, lipid management, diabetes, heart failure and other comorbidities.;Nutrition handout(s) given to patient.    Expected Outcomes Short Term Goal: A plan has been developed with personal nutrition goals set during dietitian appointment.;Short Term Goal: Understand basic principles of dietary content, such as calories, fat, sodium, cholesterol and nutrients.;Long Term Goal: Adherence to prescribed nutrition plan.             Nutrition Assessments:  MEDIFICTS Score Key: ?70 Need to make dietary changes  40-70 Heart Healthy Diet ? 40 Therapeutic Level Cholesterol Diet  Flowsheet Row Pulmonary Rehab from 04/27/2022 in [facility]  Picture Your Plate Total Score on Admission 62      Picture Your Plate Scores: <16 Unhealthy dietary pattern with much room for improvement. 41-50 Dietary pattern unlikely to meet recommendations for good health and room for improvement. 51-60 More healthful dietary pattern, with some room for improvement.  >60 Healthy dietary pattern, although there may be some specific behaviors that could be improved.   Nutrition Goals Re-Evaluation:  Nutrition Goals Re-Evaluation     Row Name 05/19/22 1128 05/28/22 1105 06/30/22  1109         Goals   Current Weight -- 165 lb (74.8 kg) 165 lb (74.8 kg)     Nutrition Goal -- Eat more fruits and Vegetables Work on appetite     Comment Patient was informed on why it is important to maintain a balanced diet when dealing with Respiratory issues. Explained that it takes a lot of energy to breath and when they are short of breath often they will need to have a good diet to help keep up with the calories they are expending for breathing. Julius wants to try to eat healthier. There are some vegetables that she does not like. She likes fruit, red peppers and leans protiens. Her husband is a diabetic and she wants to eat healthy for him to. Sanaiya states that her diet is going well but has not had that much of an appetite. Her appetite has been lower due to her stomach surgery back in October. She does not feel as hungy and lose about 20 pounds since the surgery.     Expected Outcome Short: Choose and plan snacks accordingly to patients caloric intake to improve breathing. Long: Maintain a diet independently that meets their caloric intake to aid in daily shortness of  breath. Short: eat more vegetables. Long: adhere to a diet that pertains to her. Short: eat smaller more frequent meals. Long: maintain a diet that can keep up with her appetite.              Nutrition Goals Discharge (Final Nutrition Goals Re-Evaluation):  Nutrition Goals Re-Evaluation - 06/30/22 1109       Goals   Current Weight 165 lb (74.8 kg)    Nutrition Goal Work on appetite    Comment Nataley states that her diet is going well but has not had that much of an appetite. Her appetite has been lower due to her stomach surgery back in October. She does not feel as hungy and lose about 20 pounds since the surgery.    Expected Outcome Short: eat smaller more frequent meals. Long: maintain a diet that can keep up with her appetite.             Psychosocial: Target Goals: Acknowledge presence or absence of  significant depression and/or stress, maximize coping skills, provide positive support system. Participant is able to verbalize types and ability to use techniques and skills needed for reducing stress and depression.   Education: Stress, Anxiety, and Depression - Group verbal and visual presentation to define topics covered.  Reviews how body is impacted by stress, anxiety, and depression.  Also discusses healthy ways to reduce stress and to treat/manage anxiety and depression.  Written material given at graduation.   Education: Sleep Hygiene -Provides group verbal and written instruction about how sleep can affect your health.  Define sleep hygiene, discuss sleep cycles and impact of sleep habits. Review good sleep hygiene tips.    Initial Review & Psychosocial Screening:  Initial Psych Review & Screening - 04/26/22 1114       Initial Review   Current issues with Current Sleep Concerns;Current Stress Concerns    Source of Stress Concerns Chronic Illness      Family Dynamics   Good Support System? Yes    Comments Brihanna states that she does not sleep very well. She wakes up every couple hours or so. She can look to her family for support and has great support from the rest of her family. Fumiye may have to go back for more abnominal surgery due to a hernia which creates some stress.      Barriers   Psychosocial barriers to participate in program The patient should benefit from training in stress management and relaxation.;There are no identifiable barriers or psychosocial needs.      Screening Interventions   Interventions Encouraged to exercise;To provide support and resources with identified psychosocial needs;Provide feedback about the scores to participant    Expected Outcomes Short Term goal: Utilizing psychosocial counselor, staff and physician to assist with identification of specific Stressors or current issues interfering with healing process. Setting desired goal for each stressor  or current issue identified.;Long Term Goal: Stressors or current issues are controlled or eliminated.;Short Term goal: Identification and review with participant of any Quality of Life or Depression concerns found by scoring the questionnaire.;Long Term goal: The participant improves quality of Life and PHQ9 Scores as seen by post scores and/or verbalization of changes             Quality of Life Scores:  Scores of 19 and below usually indicate a poorer quality of life in these areas.  A difference of  2-3 points is a clinically meaningful difference.  A difference of 2-3 points in the total  score of the Quality of Life Index has been associated with significant improvement in overall quality of life, self-image, physical symptoms, and general health in studies assessing change in quality of life.  PHQ-9: Review Flowsheet       06/30/2022 05/28/2022 04/27/2022  Depression screen PHQ 2/9  Decreased Interest 0 0 1  Down, Depressed, Hopeless 0 0 1  PHQ - 2 Score 0 0 2  Altered sleeping 3 3 3   Tired, decreased energy 3 3 3   Change in appetite 2 2 3   Feeling bad or failure about yourself  0 0 1  Trouble concentrating 0 0 0  Moving slowly or fidgety/restless 0 0 0  Suicidal thoughts 0 0 0  PHQ-9 Score 8 8 12   Difficult doing work/chores Not difficult at all Not difficult at all Very difficult   Interpretation of Total Score  Total Score Depression Severity:  1-4 = Minimal depression, 5-9 = Mild depression, 10-14 = Moderate depression, 15-19 = Moderately severe depression, 20-27 = Severe depression   Psychosocial Evaluation and Intervention:  Psychosocial Evaluation - 04/26/22 1116       Psychosocial Evaluation & Interventions   Interventions Encouraged to exercise with the program and follow exercise prescription;Relaxation education;Stress management education    Comments Johonna states that she does not sleep very well. She wakes up every couple hours or so. She can look to her  family for support and has great support from the rest of her family.    Expected Outcomes Short: Start [facility] to help with mood. Long: Maintain a healthy mental state.    Continue Psychosocial Services  Follow up required by staff             Psychosocial Re-Evaluation:  Psychosocial Re-Evaluation     Row Name 05/28/22 1111 06/30/22 1113           Psychosocial Re-Evaluation   Current issues with None Identified Current Sleep Concerns      Comments Reviewed patient health questionnaire (PHQ-9) with patient for follow up. Previously, patients score indicated signs/symptoms of depression.  Reviewed to see if patient is improving symptom wise while in program.  Score improved and patient states that it is because she has been trying to improve her health more. Reviewed patient health questionnaire (PHQ-9) with patient for follow up. Previously, patients score indicated signs/symptoms of depression.  Reviewed to see if patient is improving symptom wise while in program.  Score stayed the same and patient states that it is because her energy and sleep have been low and the same as last evaluation.      Expected Outcomes Short: Continue to attend [facility] regularly for regular exercise and social engagement. Long: Continue to improve symptoms and manage a positive mental state. Short: Continue to attend [facility] regularly for regular exercise and social engagement. Long: Continue to improve symptoms and manage a positive mental state.      Interventions Encouraged to attend Pulmonary Rehabilitation for the exercise Encouraged to attend Pulmonary Rehabilitation for the exercise      Continue Psychosocial Services  Follow up required by staff Follow up required by staff               Psychosocial Discharge (Final Psychosocial Re-Evaluation):  Psychosocial Re-Evaluation - 06/30/22 1113       Psychosocial Re-Evaluation   Current issues with Current Sleep Concerns     Comments Reviewed patient health questionnaire (PHQ-9) with patient for follow up. Previously, patients score indicated signs/symptoms of depression.  Reviewed to see if patient is improving symptom wise while in program.  Score stayed the same and patient states that it is because her energy and sleep have been low and the same as last evaluation.    Expected Outcomes Short: Continue to attend [facility] regularly for regular exercise and social engagement. Long: Continue to improve symptoms and manage a positive mental state.    Interventions Encouraged to attend Pulmonary Rehabilitation for the exercise    Continue Psychosocial Services  Follow up required by staff             Education: Education Goals: Education classes will be provided on a weekly basis, covering required topics. Participant will state understanding/return demonstration of topics presented.  Learning Barriers/Preferences:  Learning Barriers/Preferences - 04/26/22 1113       Learning Barriers/Preferences   Learning Barriers None    Learning Preferences None             General Pulmonary Education Topics:  Infection Prevention: - Provides verbal and written material to individual with discussion of infection control including proper hand washing and proper equipment cleaning during exercise session. Flowsheet Row Pulmonary Rehab from 06/16/2022 in [facility] Cardiac and Pulmonary Rehab  Date 04/26/22  Educator [facility]  Instruction Review Code 1- Verbalizes Understanding       Falls Prevention: - Provides verbal and written material to individual with discussion of falls prevention and safety. Flowsheet Row Pulmonary Rehab from 06/16/2022 in [facility] Cardiac and Pulmonary Rehab  Date 04/26/22  Educator [facility]  Instruction Review Code 1- Verbalizes Understanding       Chronic Lung Disease Review: - Group verbal instruction with posters, models, PowerPoint presentations and videos,  to review new updates,  new respiratory medications, new advancements in procedures and treatments. Providing information on websites and "800" numbers for continued self-education. Includes information about supplement oxygen, available portable oxygen systems, continuous and intermittent flow rates, oxygen safety, concentrators, and Medicare reimbursement for oxygen. Explanation of Pulmonary Drugs, including class, frequency, complications, importance of spacers, rinsing mouth after steroid MDI's, and proper cleaning methods for nebulizers. Review of basic lung anatomy and physiology related to function, structure, and complications of lung disease. Review of risk factors. Discussion about methods for diagnosing sleep apnea and types of masks and machines for OSA. Includes a review of the use of types of environmental controls: home humidity, furnaces, filters, dust mite/pet prevention, HEPA vacuums. Discussion about weather changes, air quality and the benefits of nasal washing. Instruction on Warning signs, infection symptoms, calling MD promptly, preventive modes, and value of vaccinations. Review of effective airway clearance, coughing and/or vibration techniques. Emphasizing that all should Create an Action Plan. Written material given at graduation. Flowsheet Row Pulmonary Rehab from 06/16/2022 in [facility] Cardiac and Pulmonary Rehab  Education need identified 04/27/22  Date 05/12/22  Educator [facility]  Instruction Review Code 1- Verbalizes Understanding       AED/CPR: - Group verbal and written instruction with the use of models to demonstrate the basic use of the AED with the basic ABC's of resuscitation.    Anatomy and Cardiac Procedures: - Group verbal and visual presentation and models provide information about basic cardiac anatomy and function. Reviews the testing methods done to diagnose heart disease and the outcomes of the test results. Describes the treatment choices: Medical Management, Angioplasty, or Coronary  Bypass Surgery for treating various heart conditions including Myocardial Infarction, Angina, Valve Disease, and Cardiac Arrhythmias.  Written material given at graduation.   Medication Safety: -  Group verbal and visual instruction to review commonly prescribed medications for heart and lung disease. Reviews the medication, class of the drug, and side effects. Includes the steps to properly store meds and maintain the prescription regimen.  Written material given at graduation.   Other: -Provides group and verbal instruction on various topics (see comments)   Knowledge Questionnaire Score:  Knowledge Questionnaire Score - 04/27/22 1422       Knowledge Questionnaire Score   Pre Score 16/18              Core Components/Risk Factors/Patient Goals at Admission:  Personal Goals and Risk Factors at Admission - 04/27/22 1424       Core Components/Risk Factors/Patient Goals on Admission    Weight Management Yes;Weight Loss    Intervention Weight Management: Develop a combined nutrition and exercise program designed to reach desired caloric intake, while maintaining appropriate intake of nutrient and fiber, sodium and fats, and appropriate energy expenditure required for the weight goal.;Weight Management: Provide education and appropriate resources to help participant work on and attain dietary goals.;Weight Management/Obesity: Establish reasonable short term and long term weight goals.    Admit Weight 166 lb 11.2 oz (75.6 kg)    Goal Weight: Short Term 160 lb (72.6 kg)    Goal Weight: Long Term 155 lb (70.3 kg)    Expected Outcomes Short Term: Continue to assess and modify interventions until short term weight is achieved;Long Term: Adherence to nutrition and physical activity/exercise program aimed toward attainment of established weight goal;Weight Loss: Understanding of general recommendations for a balanced deficit meal plan, which promotes 1-2 lb weight loss per week and includes a  negative energy balance of [phone] kcal/d;Understanding recommendations for meals to include 15-35% energy as protein, 25-35% energy from fat, 35-60% energy from carbohydrates, less than 200mg  of dietary cholesterol, 20-35 gm of total fiber daily;Understanding of distribution of calorie intake throughout the day with the consumption of 4-5 meals/snacks    Improve shortness of breath with ADL's Yes    Intervention Provide education, individualized exercise plan and daily activity instruction to help decrease symptoms of SOB with activities of daily living.    Expected Outcomes Short Term: Improve cardiorespiratory fitness to achieve a reduction of symptoms when performing ADLs;Long Term: Be able to perform more ADLs without symptoms or delay the onset of symptoms    Hypertension Yes    Intervention Provide education on lifestyle modifcations including regular physical activity/exercise, weight management, moderate sodium restriction and increased consumption of fresh fruit, vegetables, and low fat dairy, alcohol moderation, and smoking cessation.;Monitor prescription use compliance.    Expected Outcomes Short Term: Continued assessment and intervention until BP is < 140/18mm HG in hypertensive participants. < 130/83mm HG in hypertensive participants with diabetes, heart failure or chronic kidney disease.;Long Term: Maintenance of blood pressure at goal levels.             Education:Diabetes - Individual verbal and written instruction to review signs/symptoms of diabetes, desired ranges of glucose level fasting, after meals and with exercise. Acknowledge that pre and post exercise glucose checks will be done for 3 sessions at entry of program.   Know Your Numbers and Heart Failure: - Group verbal and visual instruction to discuss disease risk factors for cardiac and pulmonary disease and treatment options.  Reviews associated critical values for Overweight/Obesity, Hypertension, Cholesterol, and  Diabetes.  Discusses basics of heart failure: signs/symptoms and treatments.  Introduces Heart Failure Zone chart for action plan for heart failure.  Written material given at graduation.   Core Components/Risk Factors/Patient Goals Review:   Goals and Risk Factor Review     Row Name 05/19/22 1126 05/28/22 1108 06/30/22 1115         Core Components/Risk Factors/Patient Goals Review   Personal Goals Review Improve shortness of breath with ADL's Weight Management/Obesity Hypertension     Review Spoke to patient about their shortness of breath and what they can do to improve. Patient has been informed of breathing techniques when starting the program. Patient is informed to tell staff if they have had any med changes and that certain meds they are taking or not taking can be causing shortness of breath. Mariely has lost 15 pounds after her abdominal surgery from a bowel blockage. She is trying to eat healthier and lose more weight. Her weight goal is 150 pounds. Colleena has had good blood pressure reading in the program. Sometimes she is in the lower 90s systolic. She is sometimes dizzy when her pressure gets low but is able to manage it. Her doctor is aware of her lower blood pressure readings. She checks her pressure at home daily.     Expected Outcomes Short: Attend [facility] regularly to improve shortness of breath with ADL's. Long: maintain independence with ADL's Short: lose a few pounds in the next few weeks. Long; reach weight of 150 pounds. Short: check blood pressure readings at home. Long: maintain blood pressure readings independently.              Core Components/Risk Factors/Patient Goals at Discharge (Final Review):   Goals and Risk Factor Review - 06/30/22 1115       Core Components/Risk Factors/Patient Goals Review   Personal Goals Review Hypertension    Review Ixel has had good blood pressure reading in the program. Sometimes she is in the lower 90s systolic. She is sometimes  dizzy when her pressure gets low but is able to manage it. Her doctor is aware of her lower blood pressure readings. She checks her pressure at home daily.    Expected Outcomes Short: check blood pressure readings at home. Long: maintain blood pressure readings independently.             ITP Comments:  ITP Comments     Row Name 04/26/22 1111 04/27/22 1421 05/03/22 1100 05/12/22 0948 06/09/22 0859   ITP Comments Virtual Visit completed. Patient informed on EP and RD appointment and 6 Minute walk test. Patient also informed of patient health questionnaires on My Chart. Patient Verbalizes understanding. Visit diagnosis can be found in [facility] 04/12/[facility]22. Completed and gym orientation. Initial ITP created and sent for review to Dr. Vida Rigger, Medical Director. First full day of exercise!  Patient was oriented to gym and equipment including functions, settings, policies, and procedures.  Patient's individual exercise prescription and treatment plan were reviewed.  All starting workloads were established based on the results of the 6 minute walk test done at initial orientation visit.  The plan for exercise progression was also introduced and progression will be customized based on patient's performance and goals. 30 Day review completed. Medical Director ITP review done, changes made as directed, and signed approval by Medical Director.    new to program 30 Day review completed. Medical Director ITP review done, changes made as directed, and signed approval by Medical Director.    Row Name 07/07/22 0800 08/04/22 0751 09/01/22 0749 09/01/22 0750     ITP Comments 30 day review completed. ITP sent to  Dr. Jinny Sanders, Medical Director of  Pulmonary Rehab. Continue with ITP unless changes are made by physician. 30 Day review completed. Medical Director ITP review done, changes made as directed, and signed approval by Medical Director. Pt has not attended since 07/26/22.  Unable to assess goals this  round. 30 day review completed. ITP sent to Dr. Jinny Sanders, Medical Director of  Pulmonary Rehab. Continue with ITP unless changes are made by physician.             Comments: 30 day review

## 2022-09-01 NOTE — Telephone Encounter (Signed)
Left message for patient for pulmonary rehab asking for callback. Has not attended since 3/11.

## 2022-09-03 ENCOUNTER — Ambulatory Visit: Payer: Medicare PPO

## 2022-09-06 ENCOUNTER — Ambulatory Visit: Payer: Medicare PPO | Admitting: *Deleted

## 2022-09-08 ENCOUNTER — Ambulatory Visit: Payer: Medicare PPO | Admitting: *Deleted

## 2022-09-10 ENCOUNTER — Ambulatory Visit: Payer: Medicare PPO

## 2022-09-13 ENCOUNTER — Ambulatory Visit: Payer: Medicare PPO

## 2022-09-15 ENCOUNTER — Ambulatory Visit: Payer: Medicare PPO

## 2022-09-17 ENCOUNTER — Other Ambulatory Visit: Payer: Self-pay | Admitting: Pulmonary Disease

## 2022-09-17 ENCOUNTER — Ambulatory Visit: Payer: Medicare PPO

## 2022-09-17 DIAGNOSIS — J849 Interstitial pulmonary disease, unspecified: Secondary | ICD-10-CM

## 2022-09-23 DIAGNOSIS — R0600 Dyspnea, unspecified: Secondary | ICD-10-CM

## 2022-09-23 NOTE — Progress Notes (Signed)
Discharge Progress Report  Patient Details  Name: Amy Huynh MRN: [id number] Date of Birth: 07/31/1955 Referring Provider:   Flowsheet Row Pulmonary Rehab from 04/27/2022 in [facility]  Referring Provider Dr Vida Rigger        Number of Visits: 13  Reason for Discharge:  Early Exit:  Lack of attendance   Diagnosis:  Dyspnea, unspecified type   Functional Capacity:  6 Minute Walk     Row Name 04/27/22 1433         6 Minute Walk   Phase Initial     Distance 800 feet     Walk Time 6 minutes     # of Rest Breaks 0     MPH 1.52     METS 2.15     RPE 13     Perceived Dyspnea  2     VO2 Peak 7.52     Symptoms Yes (comment)     Comments SOB, Hip Pain 4/10     Resting HR 66 bpm     Resting BP 116/62     Resting Oxygen Saturation  98 %     Exercise Oxygen Saturation  during 6 min walk 96 %     Max Ex. HR 96 bpm     Max Ex. BP 124/66     2 Minute Post BP 104/64       Interval HR   1 Minute HR 96     2 Minute HR 96     3 Minute HR 96     4 Minute HR 94     5 Minute HR 94     6 Minute HR 90     2 Minute Post HR 69     Interval Heart Rate? Yes       Interval Oxygen   Interval Oxygen? Yes     Baseline Oxygen Saturation % 98 %     1 Minute Oxygen Saturation % 98 %     1 Minute Liters of Oxygen 0 L  RA     2 Minute Oxygen Saturation % 97 %     2 Minute Liters of Oxygen 0 L  RA     3 Minute Oxygen Saturation % 96 %     3 Minute Liters of Oxygen 0 L  RA     4 Minute Oxygen Saturation % 98 %     4 Minute Liters of Oxygen 0 L  RA     5 Minute Oxygen Saturation % 99 %     5 Minute Liters of Oxygen 0 L  RA     6 Minute Oxygen Saturation % 98 %     6 Minute Liters of Oxygen 0 L  RA     2 Minute Post Oxygen Saturation % 99 %     2 Minute Post Liters of Oxygen 0 L  RA                Nutrition & Weight - Outcomes:  Pre Biometrics - 04/27/22 1449       Pre Biometrics   Height 5\' 4"  (1.626 m)    Weight 166 lb 11.2 oz  (75.6 kg)    Waist Circumference 34.5 inches    Hip Circumference 40.5 inches    Waist to Hip Ratio 0.85 %    BMI (Calculated) 28.6    Single Leg Stand 2.4 seconds   Left Leg

## 2022-09-23 NOTE — Progress Notes (Signed)
Pulmonary Individual Treatment Plan  Patient Details  Name: Amy Huynh MRN: [id number] Date of Birth: 1956/02/11 Referring Provider:   Flowsheet Row Pulmonary Rehab from 04/27/2022 in [facility] Cardiac and Pulmonary Rehab  Referring Provider Dr Ottie Glazier       Initial Encounter Date:  Flowsheet Row Pulmonary Rehab from 04/27/2022 in [facility] Cardiac and Pulmonary Rehab  Date 04/27/22       Visit Diagnosis: Dyspnea, unspecified type  Patient's Home Medications on Admission:  Current Outpatient Medications:    Apremilast (OTEZLA) 30 MG TABS, Take 30 mg by mouth 2 (two) times daily., Disp: , Rfl:    butalbital-acetaminophen-caffeine (FIORICET, ESGIC) 50-325-40 MG tablet, Take 1 tablet by mouth every 6 (six) hours as needed for headache., Disp: , Rfl:    cetirizine (ZYRTEC) 10 MG tablet, Take 10 mg by mouth daily., Disp: , Rfl:    clobetasol cream (TEMOVATE) 0.05 %, Apply topically., Disp: , Rfl:    cyanocobalamin 1000 MCG tablet, Take 1,000 mcg by mouth daily., Disp: , Rfl:    dexlansoprazole (DEXILANT) 60 MG capsule, TAKE 1 CAPSULE(60 MG) BY MOUTH DAILY, Disp: 90 capsule, Rfl: 2   Eptinezumab-jjmr (VYEPTI) 100 MG/ML injection, Inject 100 mg into the vein every 3 (three) months. Last Saturday, Disp: , Rfl:    fexofenadine (ALLEGRA) 180 MG tablet, Take 180 mg by mouth daily., Disp: , Rfl:    folic acid (FOLVITE) 1 MG tablet, Take 1 mg by mouth daily., Disp: , Rfl:    gabapentin (NEURONTIN) 300 MG capsule, Take 900 mg by mouth at bedtime., Disp: , Rfl:    hydrOXYzine (ATARAX/VISTARIL) 10 MG tablet, Take 10 mg by mouth every 8 (eight) hours as needed., Disp: , Rfl:    hyoscyamine (LEVSIN, ANASPAZ) 0.125 MG tablet, Take 0.125 mg by mouth every 4 (four) hours as needed., Disp: , Rfl:    methocarbamol (ROBAXIN) 500 MG tablet, Take 500 mg by mouth at bedtime. , Disp: , Rfl:    methotrexate (RHEUMATREX) 2.5 MG tablet, Take 20 mg by mouth once a week. Take 4 tablets (10 mg ) by  mouth in the morning and 4 tablets(10 mg by mouth in the evening on Saturdays (total= 20 mg), Disp: , Rfl:    ondansetron (ZOFRAN-ODT) 4 MG disintegrating tablet, Take 1 tablet (4 mg total) by mouth every 8 (eight) hours as needed for nausea or vomiting., Disp: 20 tablet, Rfl: 0   pilocarpine (SALAGEN) 5 MG tablet, Take 5 mg by mouth 3 (three) times daily., Disp: , Rfl:    promethazine (PHENERGAN) 25 MG tablet, Take 25 mg by mouth every 6 (six) hours as needed for nausea or vomiting., Disp: , Rfl:    promethazine-dextromethorphan (PROMETHAZINE-DM) 6.25-15 MG/5ML syrup, Take 5 mLs by mouth 4 (four) times daily as needed., Disp: 118 mL, Rfl: 0   rizatriptan (MAXALT) 10 MG tablet, Take 10 mg by mouth as needed for migraine. May repeat in 2 hours if needed, Disp: , Rfl:    rOPINIRole (REQUIP) 2 MG tablet, Take 2 mg by mouth at bedtime., Disp: , Rfl:    topiramate (TOPAMAX) 100 MG tablet, Take 200 mg by mouth daily., Disp: , Rfl:    valACYclovir (VALTREX) 1000 MG tablet, , Disp: , Rfl:   Past Medical History: Past Medical History:  Diagnosis Date   Arthritis    Family history of ovarian cancer 12/2016   MyRisk neg   Fibromyalgia    Migraine    Neuropathy    Placenta previa  Sjogren's disease ([place])     Tobacco Use: Social History   Tobacco Use  Smoking Status Never  Smokeless Tobacco Never    Labs: Review Flowsheet        No data to display           Pulmonary Assessment Scores:  Pulmonary Assessment Scores     Row Name 04/27/22 1425         ADL UCSD   ADL Phase Entry     SOB Score total 57     Rest 0     Walk 3     Stairs 5     Bath 3     Dress 2     Shop 4       CAT Score   CAT Score 18       mMRC Score   mMRC Score 1              UCSD: Self-administered rating of dyspnea associated with activities of daily living (ADLs) 6-point scale (0 = "not at all" to 5 = "maximal or unable to do because of breathlessness")  Scoring Scores range from 0 to  120.  Minimally important difference is 5 units  CAT: CAT can identify the health impairment of COPD patients and is better correlated with disease progression.  CAT has a scoring range of zero to 40. The CAT score is classified into four groups of low (less than 10), medium (10 - 20), high (21-30) and very high (31-40) based on the impact level of disease on health status. A CAT score over 10 suggests significant symptoms.  A worsening CAT score could be explained by an exacerbation, poor medication adherence, poor inhaler technique, or progression of COPD or comorbid conditions.  CAT MCID is 2 points  mMRC: mMRC (Modified Medical Research Council) Dyspnea Scale is used to assess the degree of baseline functional disability in patients of respiratory disease due to dyspnea. No minimal important difference is established. A decrease in score of 1 point or greater is considered a positive change.   Pulmonary Function Assessment:  Pulmonary Function Assessment - 04/26/22 1112       Breath   Shortness of Breath Yes;Limiting activity             Exercise Target Goals: Exercise Program Goal: Individual exercise prescription set using results from initial 6 min walk test and THRR while considering  patient's activity barriers and safety.   Exercise Prescription Goal: Initial exercise prescription builds to 30-45 minutes a day of aerobic activity, 2-3 days per week.  Home exercise guidelines will be given to patient during program as part of exercise prescription that the participant will acknowledge.  Education: Aerobic Exercise: - Group verbal and visual presentation on the components of exercise prescription. Introduces F.I.T.T principle from ACSM for exercise prescriptions.  Reviews F.I.T.T. principles of aerobic exercise including progression. Written material given at graduation.   Education: Resistance Exercise: - Group verbal and visual presentation on the components of exercise  prescription. Introduces F.I.T.T principle from ACSM for exercise prescriptions  Reviews F.I.T.T. principles of resistance exercise including progression. Written material given at graduation.    Education: Exercise & Equipment Safety: - Individual verbal instruction and demonstration of equipment use and safety with use of the equipment. Flowsheet Row Pulmonary Rehab from 06/16/2022 in [facility] Cardiac and Pulmonary Rehab  Date 04/26/22  Educator [facility]  Instruction Review Code 1- Verbalizes Understanding       Education: Exercise Physiology &  General Exercise Guidelines: - Group verbal and written instruction with models to review the exercise physiology of the cardiovascular system and associated critical values. Provides general exercise guidelines with specific guidelines to those with heart or lung disease.    Education: Flexibility, Balance, Mind/Body Relaxation: - Group verbal and visual presentation with interactive activity on the components of exercise prescription. Introduces F.I.T.T principle from ACSM for exercise prescriptions. Reviews F.I.T.T. principles of flexibility and balance exercise training including progression. Also discusses the mind body connection.  Reviews various relaxation techniques to help reduce and manage stress (i.e. Deep breathing, progressive muscle relaxation, and visualization). Balance handout provided to take home. Written material given at graduation. Flowsheet Row Pulmonary Rehab from 06/16/2022 in [facility] Cardiac and Pulmonary Rehab  Date 06/16/22  Educator [place]  Instruction Review Code 1- Verbalizes Understanding       Activity Barriers & Risk Stratification:  Activity Barriers & Cardiac Risk Stratification - 04/27/22 1438       Activity Barriers & Cardiac Risk Stratification   Activity Barriers Shortness of Breath;Left Knee Replacement;Other (comment);Back Problems    Comments Neuropathy in feet, Hip Pain             6 Minute Walk:  6 Minute  Walk     Row Name 04/27/22 1433         6 Minute Walk   Phase Initial     Distance 800 feet     Walk Time 6 minutes     # of Rest Breaks 0     MPH 1.52     METS 2.15     RPE 13     Perceived Dyspnea  2     VO2 Peak 7.52     Symptoms Yes (comment)     Comments SOB, Hip Pain 4/10     Resting HR 66 bpm     Resting BP 116/62     Resting Oxygen Saturation  98 %     Exercise Oxygen Saturation  during 6 min walk 96 %     Max Ex. HR 96 bpm     Max Ex. BP 124/66     2 Minute Post BP 104/64       Interval HR   1 Minute HR 96     2 Minute HR 96     3 Minute HR 96     4 Minute HR 94     5 Minute HR 94     6 Minute HR 90     2 Minute Post HR 69     Interval Heart Rate? Yes       Interval Oxygen   Interval Oxygen? Yes     Baseline Oxygen Saturation % 98 %     1 Minute Oxygen Saturation % 98 %     1 Minute Liters of Oxygen 0 L  RA     2 Minute Oxygen Saturation % 97 %     2 Minute Liters of Oxygen 0 L  RA     3 Minute Oxygen Saturation % 96 %     3 Minute Liters of Oxygen 0 L  RA     4 Minute Oxygen Saturation % 98 %     4 Minute Liters of Oxygen 0 L  RA     5 Minute Oxygen Saturation % 99 %     5 Minute Liters of Oxygen 0 L  RA     6 Minute Oxygen Saturation % 98 %  6 Minute Liters of Oxygen 0 L  RA     2 Minute Post Oxygen Saturation % 99 %     2 Minute Post Liters of Oxygen 0 L  RA             Oxygen Initial Assessment:  Oxygen Initial Assessment - 04/26/22 1112       Home Oxygen   Home Oxygen Device None    Sleep Oxygen Prescription None    Home Exercise Oxygen Prescription None    Home Resting Oxygen Prescription None      Initial 6 min Walk   Oxygen Used None      Program Oxygen Prescription   Program Oxygen Prescription None      Intervention   Short Term Goals To learn and demonstrate proper pursed lip breathing techniques or other breathing techniques. ;To learn and understand importance of monitoring SPO2 with pulse oximeter and demonstrate  accurate use of the pulse oximeter.;To learn and understand importance of maintaining oxygen saturations>88%;To learn and exhibit compliance with exercise, home and travel O2 prescription    Long  Term Goals Exhibits compliance with exercise, home  and travel O2 prescription;Maintenance of O2 saturations>88%;Demonstrates proper use of MDI's;Exhibits proper breathing techniques, such as pursed lip breathing or other method taught during program session;Verbalizes importance of monitoring SPO2 with pulse oximeter and return demonstration             Oxygen Re-Evaluation:  Oxygen Re-Evaluation     Row Name 05/03/22 1102 05/19/22 1127 05/28/22 1103 06/16/22 1152 06/30/22 1107     Program Oxygen Prescription   Program Oxygen Prescription -- None None None None     Home Oxygen   Home Oxygen Device -- None None None None   Sleep Oxygen Prescription -- None None None None   Home Exercise Oxygen Prescription -- None None None None   Home Resting Oxygen Prescription -- None None None None     Goals/Expected Outcomes   Short Term Goals -- To learn and understand importance of maintaining oxygen saturations>88%;To learn and understand importance of monitoring SPO2 with pulse oximeter and demonstrate accurate use of the pulse oximeter. To learn and demonstrate proper pursed lip breathing techniques or other breathing techniques.  To learn and demonstrate proper pursed lip breathing techniques or other breathing techniques.  To learn and demonstrate proper use of respiratory medications   Long  Term Goals -- Verbalizes importance of monitoring SPO2 with pulse oximeter and return demonstration;Maintenance of O2 saturations>88% Exhibits proper breathing techniques, such as pursed lip breathing or other method taught during program session Exhibits proper breathing techniques, such as pursed lip breathing or other method taught during program session Compliance with respiratory medication   Comments  Reviewed PLB technique with pt.  Talked about how it works and it's importance in maintaining their exercise saturations. Jamina has a pulse oximeter to check her oxygen saturation at home. Informed and explained why it is important to have one. Reviewed that oxygen saturations should be 88 percent and above. Patient verbalizes understanding Informed patient how to perform the Pursed Lipped breathing technique. Told patient to Inhale through the nose and out the mouth with pursed lips to keep their airways open, help oxygenate them better, practice when at rest or doing strenuous activity. Patient Verbalizes understanding of technique and will work on and be reiterated during [place]. Diaphragmatic and PLB breathing explained and performed with patient. Patient has a better understanding of how to do these exercises to help  with breathing performance and relaxation. Patient performed breathing techniques adequately and to practice further at home. Zevaeh states she has been using her breathing techniques to help her shortness of breath. Her shortness of breath has been better since the start of the program. She has an albuterol inhaler that she uses as needed. She has not needed to use her inhaler in a long times.   Goals/Expected Outcomes Short: Become more profiecient at using PLB. Long: Become independent at using PLB. Short: monitor oxygen at home with exertion. Long: maintain oxygen saturations above 88 percent independently. Short: use PLB with exertion. Long: use PLB on exertion proficiently and independently. Short: practice PLB and diaphragmatic breathing at home. Long: Use PLB and diaphragmatic breathing independently post [facility]. Short: continue working on breathing techniques. Long: maintain breathing techniques independently.            Oxygen Discharge (Final Oxygen Re-Evaluation):  Oxygen Re-Evaluation - 06/30/22 1107       Program Oxygen Prescription   Program Oxygen Prescription  None      Home Oxygen   Home Oxygen Device None    Sleep Oxygen Prescription None    Home Exercise Oxygen Prescription None    Home Resting Oxygen Prescription None      Goals/Expected Outcomes   Short Term Goals To learn and demonstrate proper use of respiratory medications    Long  Term Goals Compliance with respiratory medication    Comments Litia states she has been using her breathing techniques to help her shortness of breath. Her shortness of breath has been better since the start of the program. She has an albuterol inhaler that she uses as needed. She has not needed to use her inhaler in a long times.    Goals/Expected Outcomes Short: continue working on breathing techniques. Long: maintain breathing techniques independently.             Initial Exercise Prescription:  Initial Exercise Prescription - 04/27/22 1400       Date of Initial Exercise RX and Referring Provider   Date 04/27/22    Referring Provider Dr Ottie Glazier      Oxygen   Maintain Oxygen Saturation 88% or higher      Recumbant Bike   Level 1    RPM 50    Watts 15    Minutes 15    METs 2.15      NuStep   Level 2    SPM 80    Minutes 15    METs 2.15      REL-XR   Level 1    Watts 15    Speed 50    Minutes 15    METs 2.15      Track   Laps 20    Minutes 15    METs 2.09      Prescription Details   Frequency (times per week) 3    Duration Progress to 30 minutes of continuous aerobic without signs/symptoms of physical distress      Intensity   THRR 40-80% of Max Heartrate 102-138    Ratings of Perceived Exertion 11-13    Perceived Dyspnea 0-4      Progression   Progression Continue to progress workloads to maintain intensity without signs/symptoms of physical distress.      Resistance Training   Training Prescription Yes    Weight 2 lb    Reps 10-15             Perform Capillary Blood  Glucose checks as needed.  Exercise Prescription Changes:   Exercise Prescription  Changes     Row Name 04/27/22 1400 05/13/22 0900 05/24/22 1400 06/07/22 1400 06/21/22 1400     Response to Exercise   Blood Pressure (Admit) 116/62 112/64 120/64 124/62 110/60   Blood Pressure (Exercise) 124/66 138/72 128/68 140/70 128/68   Blood Pressure (Exit) 104/64 126/62 124/62 112/64 116/66   Heart Rate (Admit) 66 bpm 68 bpm 71 bpm 77 bpm 68 bpm   Heart Rate (Exercise) 96 bpm 85 bpm 106 bpm 106 bpm 97 bpm   Heart Rate (Exit) 69 bpm 77 bpm 86 bpm 87 bpm 79 bpm   Oxygen Saturation (Admit) 98 % 98 % 96 % 98 % 98 %   Oxygen Saturation (Exercise) 96 % 98 % 93 % 96 % 93 %   Oxygen Saturation (Exit) 99 % 97 % 98 % 96 % 99 %   Rating of Perceived Exertion (Exercise) 13 15 13 14 15    Perceived Dyspnea (Exercise) 2 2 1 2 3    Symptoms SOB Hip Pain SOB SOB SOB SOB   Comments Results 2nd full day of exercise -- -- --   Duration -- Progress to 30 minutes of  aerobic without signs/symptoms of physical distress Continue with 30 min of aerobic exercise without signs/symptoms of physical distress. Continue with 30 min of aerobic exercise without signs/symptoms of physical distress. Continue with 30 min of aerobic exercise without signs/symptoms of physical distress.   Intensity -- THRR unchanged THRR unchanged THRR unchanged THRR unchanged     Progression   Progression -- Continue to progress workloads to maintain intensity without signs/symptoms of physical distress. Continue to progress workloads to maintain intensity without signs/symptoms of physical distress. Continue to progress workloads to maintain intensity without signs/symptoms of physical distress. Continue to progress workloads to maintain intensity without signs/symptoms of physical distress.   Average METs -- 2.53 2.86 2.98 3.07     Resistance Training   Training Prescription -- Yes Yes Yes Yes   Weight -- 2 lb 2 lb 2 lb 2 lb   Reps -- 10-15 10-15 10-15 10-15     Interval Training   Interval Training -- No No No No      Recumbant Bike   Level -- 1 2 2  2.5   Watts -- -- -- 15 18   Minutes -- 15 15 15 15    METs -- 2.1 3.1 2.63 2.9     NuStep   Level -- 2 4 -- 4   Minutes -- -- 15 -- 15   METs -- 2.3 3 -- 3.3     Arm Ergometer   Level -- -- -- -- 1   Minutes -- -- -- -- 15   METs -- -- -- -- 1     REL-XR   Level -- 1 1 4 4    Minutes -- 15 15 15 15    METs -- 3.2 2.7 3.5 3.8     T5 Nustep   Level -- -- -- 2 --   SPM -- -- -- 80 --   Minutes -- -- -- 15 --   METs -- -- -- 2.1 --     Oxygen   Maintain Oxygen Saturation -- 88% or higher 88% or higher 88% or higher 88% or higher    Row Name 07/05/22 1200 08/18/22 1300           Response to Exercise   Blood Pressure (Admit) 102/62 126/64  Blood Pressure (Exercise) 162/60 136/70      Blood Pressure (Exit) 110/62 106/60      Heart Rate (Admit) 66 bpm 86 bpm      Heart Rate (Exercise) 114 bpm 98 bpm      Heart Rate (Exit) 71 bpm 79 bpm      Oxygen Saturation (Admit) 98 % 98 %      Oxygen Saturation (Exercise) 95 % 94 %      Oxygen Saturation (Exit) 98 % 97 %      Rating of Perceived Exertion (Exercise) 15 14      Perceived Dyspnea (Exercise) -- 1      Symptoms SOB SOB      Duration Continue with 30 min of aerobic exercise without signs/symptoms of physical distress. Continue with 30 min of aerobic exercise without signs/symptoms of physical distress.      Intensity THRR unchanged THRR unchanged        Progression   Progression Continue to progress workloads to maintain intensity without signs/symptoms of physical distress. Continue to progress workloads to maintain intensity without signs/symptoms of physical distress.      Average METs 3.1 2.94        Resistance Training   Training Prescription Yes Yes      Weight 2 lb 2 lb      Reps 10-15 10-15        Interval Training   Interval Training No No        Recumbant Bike   Level -- 1      Watts -- 18      Minutes -- 15      METs -- 2.77        NuStep   Level -- 4       Minutes -- 15      METs -- 3.1        Arm Ergometer   Level 1 --      Minutes 15 --      METs 2 --        REL-XR   Level 5 --      Minutes 15 --      METs 4.2 --        Oxygen   Maintain Oxygen Saturation 88% or higher 88% or higher               Exercise Comments:   Exercise Comments     Row Name 05/03/22 1100           Exercise Comments First full day of exercise!  Patient was oriented to gym and equipment including functions, settings, policies, and procedures.  Patient's individual exercise prescription and treatment plan were reviewed.  All starting workloads were established based on the results of the 6 minute walk test done at initial orientation visit.  The plan for exercise progression was also introduced and progression will be customized based on patient's performance and goals.                Exercise Goals and Review:   Exercise Goals     Row Name 04/27/22 1449             Exercise Goals   Increase Physical Activity Yes       Intervention Provide advice, education, support and counseling about physical activity/exercise needs.;Develop an individualized exercise prescription for aerobic and resistive training based on initial evaluation findings, risk stratification, comorbidities and participant's personal goals.  Expected Outcomes Short Term: Attend rehab on a regular basis to increase amount of physical activity.;Long Term: Add in home exercise to make exercise part of routine and to increase amount of physical activity.;Long Term: Exercising regularly at least 3-5 days a week.       Increase Strength and Stamina Yes       Intervention Provide advice, education, support and counseling about physical activity/exercise needs.;Develop an individualized exercise prescription for aerobic and resistive training based on initial evaluation findings, risk stratification, comorbidities and participant's personal goals.       Expected Outcomes Short  Term: Perform resistance training exercises routinely during rehab and add in resistance training at home;Short Term: Increase workloads from initial exercise prescription for resistance, speed, and METs.;Long Term: Improve cardiorespiratory fitness, muscular endurance and strength as measured by increased METs and functional capacity ( )       Able to understand and use rate of perceived exertion (RPE) scale Yes       Intervention Provide education and explanation on how to use RPE scale       Expected Outcomes Short Term: Able to use RPE daily in rehab to express subjective intensity level;Long Term:  Able to use RPE to guide intensity level when exercising independently       Able to understand and use Dyspnea scale Yes       Intervention Provide education and explanation on how to use Dyspnea scale       Expected Outcomes Long Term: Able to use Dyspnea scale to guide intensity level when exercising independently;Short Term: Able to use Dyspnea scale daily in rehab to express subjective sense of shortness of breath during exertion       Knowledge and understanding of Target Heart Rate Range (THRR) Yes       Intervention Provide education and explanation of THRR including how the numbers were predicted and where they are located for reference       Expected Outcomes Short Term: Able to state/look up THRR;Long Term: Able to use THRR to govern intensity when exercising independently;Short Term: Able to use daily as guideline for intensity in rehab       Able to check pulse independently Yes       Intervention Provide education and demonstration on how to check pulse in carotid and radial arteries.;Review the importance of being able to check your own pulse for safety during independent exercise       Expected Outcomes Short Term: Able to explain why pulse checking is important during independent exercise;Long Term: Able to check pulse independently and accurately       Understanding of Exercise  Prescription Yes       Intervention Provide education, explanation, and written materials on patient's individual exercise prescription       Expected Outcomes Short Term: Able to explain program exercise prescription;Long Term: Able to explain home exercise prescription to exercise independently                Exercise Goals Re-Evaluation :  Exercise Goals Re-Evaluation     Row Name 05/03/22 1100 05/13/22 0915 05/24/22 1432 06/07/22 1437 06/21/22 1424     Exercise Goal Re-Evaluation   Exercise Goals Review Able to understand and use rate of perceived exertion (RPE) scale;Able to understand and use Dyspnea scale;Knowledge and understanding of Target Heart Rate Range (THRR);Understanding of Exercise Prescription Increase Physical Activity;Understanding of Exercise Prescription;Increase Strength and Stamina Increase Physical Activity;Understanding of Exercise Prescription;Increase Strength and Stamina Increase Physical Activity;Understanding  of Exercise Prescription;Increase Strength and Stamina Increase Physical Activity;Understanding of Exercise Prescription;Increase Strength and Stamina   Comments Reviewed RPE scale, THR and program prescription with pt today.  Pt voiced understanding and was given a copy of goals to take home. Kennon has completed her first two full days of exercise thus far.  She has done well with initial workloads.  We will conintue to monitor her progress. Bernardina is doing well in rehab. She recently improved her overall average MET level to 2.86 METs. She also was able to improve to level 2 on the recumbent bike and level 4 on the T4. She has not done any walking on the track yet, but will encourage her to start. We will continue to monitor her progress. Tharon continues to do well in rehab. She increased to level 4 on the XR and tried out the T5 Nustep and was able to exercise at a level 2. She has been hitting her THR each session. She could benefit from increasing to 3 lb  handweights. Will continue to monitor. Blessin continues to do well in the program. She has not done any walking recently, but did increase her overall average MET level to 3.07 METs. She also began using the arm crank at level 1 and did well. She has continued to use 2 lb hand weigths fro resistance training as well. We will continue to monitor her progress.   Expected Outcomes Short: Use RPE daily to regulate intensity. Long: Follow program prescription in THR. Short: Attend rehab regularly Long: Continue to improve stamina Short: Begin walking on the track. Long: Continue to improve strength and stamina. Short: Increase handweights to 3 lb Long: Continue to increase overall stamina and MET level Short: Increase handweights to 3 lb, return to walking. Long: Continue to improve strength and stamina.    Row Name 07/05/22 1205 07/19/22 1437 08/18/22 1358 09/01/22 1739 09/14/22 1421     Exercise Goal Re-Evaluation   Exercise Goals Review Increase Physical Activity;Understanding of Exercise Prescription;Increase Strength and Stamina Increase Physical Activity;Understanding of Exercise Prescription;Increase Strength and Stamina Increase Physical Activity;Understanding of Exercise Prescription;Increase Strength and Stamina Increase Physical Activity;Understanding of Exercise Prescription;Increase Strength and Stamina Increase Physical Activity;Understanding of Exercise Prescription;Increase Strength and Stamina   Comments Meital has been inconsistent with her attendance at rehab. Staff will talk to patient to reiterate attendance policy to yield good results. She did, however, increase to level 5 on the XR the 1 session she was here from last review. We will continue to monitor. Jessie has not attended rehab since the last review. We attempted to call patient and left a message requesting her to call back to pulmonary rehab. We will continue to monitor her progress once she returns to rehab. Ardene returned to rehab  for one session after being out due to her having COVID. During her one session back she was able to work at level 4 on the T4 nustep and level 1 on the recumbent bike. She has not attended rehab since 3/11, due to her continuing to struggle with being sick. We will continue to monitor her progress once she returns to rehab. Varenya has not been here since 3/11. We attempted to call patient and follow up and will continue to receive updates. If no response back, we will discharge patient. Jisella has not been here since 3/11. We attempted to call patient and follow up and will continue to receive updates. If no response back, we will discharge patient.   Expected Outcomes  Short: Maintain good attendance Long: Continue to increase overall MET level and stamina Short: Return to pulmonary rehab. Long: Continue to improve strength and stamina. Short: Return to pulmonary rehab. Long: Continue to improve strength and stamina. Short: Return back with consistent attendance Long: Graduate from [employer]: Return back with consistent attendance Long: Graduate from [employer]            Discharge Exercise Prescription (Final Exercise Prescription Changes):  Exercise Prescription Changes - 08/18/22 1300       Response to Exercise   Blood Pressure (Admit) 126/64    Blood Pressure (Exercise) 136/70    Blood Pressure (Exit) 106/60    Heart Rate (Admit) 86 bpm    Heart Rate (Exercise) 98 bpm    Heart Rate (Exit) 79 bpm    Oxygen Saturation (Admit) 98 %    Oxygen Saturation (Exercise) 94 %    Oxygen Saturation (Exit) 97 %    Rating of Perceived Exertion (Exercise) 14    Perceived Dyspnea (Exercise) 1    Symptoms SOB    Duration Continue with 30 min of aerobic exercise without signs/symptoms of physical distress.    Intensity THRR unchanged      Progression   Progression Continue to progress workloads to maintain intensity without signs/symptoms of physical distress.    Average METs 2.94       Resistance Training   Training Prescription Yes    Weight 2 lb    Reps 10-15      Interval Training   Interval Training No      Recumbant Bike   Level 1    Watts 18    Minutes 15    METs 2.77      NuStep   Level 4    Minutes 15    METs 3.1      Oxygen   Maintain Oxygen Saturation 88% or higher             Nutrition:  Target Goals: Understanding of nutrition guidelines, daily intake of sodium 1500mg , cholesterol 200mg , calories 30% from fat and 7% or less from saturated fats, daily to have 5 or more servings of fruits and vegetables.  Education: All About Nutrition: -Group instruction provided by verbal, written material, interactive activities, discussions, models, and posters to present general guidelines for heart healthy nutrition including fat, fiber, MyPlate, the role of sodium in heart healthy nutrition, utilization of the nutrition label, and utilization of this knowledge for meal planning. Follow up email sent as well. Written material given at graduation.   Biometrics:  Pre Biometrics - 04/27/22 1449       Pre Biometrics   Height 5\' 4"  (1.626 m)    Weight 166 lb 11.2 oz (75.6 kg)    Waist Circumference 34.5 inches    Hip Circumference 40.5 inches    Waist to Hip Ratio 0.85 %    BMI (Calculated) 28.6    Single Leg Stand 2.4 seconds   Left Leg             Nutrition Therapy Plan and Nutrition Goals:  Nutrition Therapy & Goals - 04/27/22 1028       Nutrition Therapy   Diet Heart healthy, low Na, pulmonary MNT    Protein (specify units) 90-95g    Fiber 25 grams    Whole Grain Foods 3 servings    Saturated Fats 12 max. grams    Fruits and Vegetables 8 servings/day    Sodium 1.5 grams  Personal Nutrition Goals   Nutrition Goal ST: practice MyPlate guidelines, include 2-3 snacks with fiber, protein, and heart healthy fat  LT: follow MyPlate guidelines, limit Na <1.5g/day, eat a variety of fruits and vegetables    Comments 67 y.o. F admitted  to pulmonary rehab s/p dyspnea. PMHx includes HTN, migraine, CKD stg 3a, Sjogrens syndrome. Relevant medication includes dexilant, folic acid, pilocarpine. Annyston reports having abdominal surgery to rotate her bowel back to normal position. She is having pain from a new hernia due to surgery, but the MD reports She reports losing 12 lbs in the last 7 weeks due to loss of appetite. She reports having irregular eating habits now  such as snacking or rice pudding, oatmeal, 1 meal per day includes pork, chicken, starchy vegetables and some non-starchy vegetables (she doesn't like most leafy greens or cruciferous vegetables). She reports going out to eat more now as she has not been cooking much at home since the surgery. Discussed sodium concerns with eating out and encouraged to get back into the habit of eating more foods at home. Lauryn reports having a hard time recalling foods at this time. Drinks: water. Discussed general healthy eating and pulmnary MNT. discussed including snacks during the day that include protein, fiber, and healthy fats to boost her nutrition and to ensure she is meeting her energy and protein needs to avoid losing msucle tissue. Some snacks may inlcude things like fruit and peanut butter or greek yogurt.      Intervention Plan   Intervention Prescribe, educate and counsel regarding individualized specific dietary modifications aiming towards targeted core components such as weight, hypertension, lipid management, diabetes, heart failure and other comorbidities.;Nutrition handout(s) given to patient.    Expected Outcomes Short Term Goal: A plan has been developed with personal nutrition goals set during dietitian appointment.;Short Term Goal: Understand basic principles of dietary content, such as calories, fat, sodium, cholesterol and nutrients.;Long Term Goal: Adherence to prescribed nutrition plan.             Nutrition Assessments:  MEDIFICTS Score Key: ?70 Need to make  dietary changes  40-70 Heart Healthy Diet ? 40 Therapeutic Level Cholesterol Diet  Flowsheet Row Pulmonary Rehab from 04/27/2022 in [facility] Cardiac and Pul[facility] Rehab  Picture Your Plate Total Score on Admission 62      Picture Your Plate Scores: <09 Unhealthy dietary pattern with much room for improvement. 41-50 Dietary pattern unlikely to meet recommendations for good health and room for improvement. 51-60 More healthful dietary pattern, with some room for improvement.  >60 Healthy dietary pattern, although there may be some specific behaviors that could be improved.   Nutrition Goals Re-Evaluation:  Nutrition Goals Re-Evaluation     Row Name 05/19/22 1128 05/28/22 1105 06/30/22 1109         Goals   Current Weight -- 165 lb (74.8 kg) 165 lb (74.8 kg)     Nutrition Goal -- Eat more fruits and Vegetables Work on appetite     Comment Patient was informed on why it is important to maintain a balanced diet when dealing with Respiratory issues. Explained that it takes a lot of energy to breath and when they are short of breath often they will need to have a good diet to help keep up with the calories they are expending for breathing. Tamicia wants to try to eat healthier. There are some vegetables that she does not like. She likes fruit, red peppers and leans protiens. Her husband is a diabetic  and she wants to eat healthy for him to. Taite states that her diet is going well but has not had that much of an appetite. Her appetite has been lower due to her stomach surgery back in October. She does not feel as hungy and lose about 20 pounds since the surgery.     Expected Outcome Short: Choose and plan snacks accordingly to patients caloric intake to improve breathing. Long: Maintain a diet independently that meets their caloric intake to aid in daily shortness of breath. Short: eat more vegetables. Long: adhere to a diet that pertains to her. Short: eat smaller more frequent meals. Long: maintain a  diet that can keep up with her appetite.              Nutrition Goals Discharge (Final Nutrition Goals Re-Evaluation):  Nutrition Goals Re-Evaluation - 06/30/22 1109       Goals   Current Weight 165 lb (74.8 kg)    Nutrition Goal Work on appetite    Comment Liannah states that her diet is going well but has not had that much of an appetite. Her appetite has been lower due to her stomach surgery back in October. She does not feel as hungy and lose about 20 pounds since the surgery.    Expected Outcome Short: eat smaller more frequent meals. Long: maintain a diet that can keep up with her appetite.             Psychosocial: Target Goals: Acknowledge presence or absence of significant depression and/or stress, maximize coping skills, provide positive support system. Participant is able to verbalize types and ability to use techniques and skills needed for reducing stress and depression.   Education: Stress, Anxiety, and Depression - Group verbal and visual presentation to define topics covered.  Reviews how body is impacted by stress, anxiety, and depression.  Also discusses healthy ways to reduce stress and to treat/manage anxiety and depression.  Written material given at graduation.   Education: Sleep Hygiene -Provides group verbal and written instruction about how sleep can affect your health.  Define sleep hygiene, discuss sleep cycles and impact of sleep habits. Review good sleep hygiene tips.    Initial Review & Psychosocial Screening:  Initial Psych Review & Screening - 04/26/22 1114       Initial Review   Current issues with Current Sleep Concerns;Current Stress Concerns    Source of Stress Concerns Chronic Illness      Family Dynamics   Good Support System? Yes    Comments Milan states that she does not sleep very well. She wakes up every couple hours or so. She can look to her family for support and has great support from the rest of her family. Arrie may have to go  back for more abnominal surgery due to a hernia which creates some stress.      Barriers   Psychosocial barriers to participate in program The patient should benefit from training in stress management and relaxation.;There are no identifiable barriers or psychosocial needs.      Screening Interventions   Interventions Encouraged to exercise;To provide support and resources with identified psychosocial needs;Provide feedback about the scores to participant    Expected Outcomes Short Term goal: Utilizing psychosocial counselor, staff and physician to assist with identification of specific Stressors or current issues interfering with healing process. Setting desired goal for each stressor or current issue identified.;Long Term Goal: Stressors or current issues are controlled or eliminated.;Short Term goal: Identification and review  with participant of any Quality of Life or Depression concerns found by scoring the questionnaire.;Long Term goal: The participant improves quality of Life and PHQ9 Scores as seen by post scores and/or verbalization of changes             Quality of Life Scores:  Scores of 19 and below usually indicate a poorer quality of life in these areas.  A difference of  2-3 points is a clinically meaningful difference.  A difference of 2-3 points in the total score of the Quality of Life Index has been associated with significant improvement in overall quality of life, self-image, physical symptoms, and general health in studies assessing change in quality of life.  PHQ-9: Review Flowsheet       06/30/2022 05/28/2022 04/27/2022  Depression screen PHQ 2/9  Decreased Interest 0 0 1  Down, Depressed, Hopeless 0 0 1  PHQ - 2 Score 0 0 2  Altered sleeping 3 3 3   Tired, decreased energy 3 3 3   Change in appetite 2 2 3   Feeling bad or failure about yourself  0 0 1  Trouble concentrating 0 0 0  Moving slowly or fidgety/restless 0 0 0  Suicidal thoughts 0 0 0  PHQ-9 Score 8 8  12   Difficult doing work/chores Not difficult at all Not difficult at all Very difficult   Interpretation of Total Score  Total Score Depression Severity:  1-4 = Minimal depression, 5-9 = Mild depression, 10-14 = Moderate depression, 15-19 = Moderately severe depression, 20-27 = Severe depression   Psychosocial Evaluation and Intervention:  Psychosocial Evaluation - 04/26/22 1116       Psychosocial Evaluation & Interventions   Interventions Encouraged to exercise with the program and follow exercise prescription;Relaxation education;Stress management education    Comments Ermalinda states that she does not sleep very well. She wakes up every couple hours or so. She can look to her family for support and has great support from the rest of her family.    Expected Outcomes Short: Start [facility] to help with mood. Long: Maintain a healthy mental state.    Continue Psychosocial Services  Follow up required by staff             Psychosocial Re-Evaluation:  Psychosocial Re-Evaluation     Row Name 05/28/22 1111 06/30/22 1113           Psychosocial Re-Evaluation   Current issues with None Identified Current Sleep Concerns      Comments Reviewed patient health questionnaire (PHQ-9) with patient for follow up. Previously, patients score indicated signs/symptoms of depression.  Reviewed to see if patient is improving symptom wise while in program.  Score improved and patient states that it is because she has been trying to improve her health more. Reviewed patient health questionnaire (PHQ-9) with patient for follow up. Previously, patients score indicated signs/symptoms of depression.  Reviewed to see if patient is improving symptom wise while in program.  Score stayed the same and patient states that it is because her energy and sleep have been low and the same as last evaluation.      Expected Outcomes Short: Continue to attend [facility] regularly for regular exercise and social  engagement. Long: Continue to improve symptoms and manage a positive mental state. Short: Continue to attend [facility] regularly for regular exercise and social engagement. Long: Continue to improve symptoms and manage a positive mental state.      Interventions Encouraged to attend Pulmonary Rehabilitation for the exercise Encouraged to  attend Pulmonary Rehabilitation for the exercise      Continue Psychosocial Services  Follow up required by staff Follow up required by staff               Psychosocial Discharge (Final Psychosocial Re-Evaluation):  Psychosocial Re-Evaluation - 06/30/22 1113       Psychosocial Re-Evaluation   Current issues with Current Sleep Concerns    Comments Reviewed patient health questionnaire (PHQ-9) with patient for follow up. Previously, patients score indicated signs/symptoms of depression.  Reviewed to see if patient is improving symptom wise while in program.  Score stayed the same and patient states that it is because her energy and sleep have been low and the same as last evaluation.    Expected Outcomes Short: Continue to attend [facility] regularly for regular exercise and social engagement. Long: Continue to improve symptoms and manage a positive mental state.    Interventions Encouraged to attend Pulmonary Rehabilitation for the exercise    Continue Psychosocial Services  Follow up required by staff             Education: Education Goals: Education classes will be provided on a weekly basis, covering required topics. Participant will state understanding/return demonstration of topics presented.  Learning Barriers/Preferences:  Learning Barriers/Preferences - 04/26/22 1113       Learning Barriers/Preferences   Learning Barriers None    Learning Preferences None             General Pulmonary Education Topics:  Infection Prevention: - Provides verbal and written material to individual with discussion of infection  control including proper hand washing and proper equipment cleaning during exercise session. Flowsheet Row Pulmonary Rehab from 06/16/2022 in [facility] Cardiac and Pulmonary Rehab  Date 04/26/22  Educator [facility]  Instruction Review Code 1- Verbalizes Understanding       Falls Prevention: - Provides verbal and written material to individual with discussion of falls prevention and safety. Flowsheet Row Pulmonary Rehab from 06/16/2022 in [facility] Cardiac and Pulmonary Rehab  Date 04/26/22  Educator [facility]  Instruction Review Code 1- Verbalizes Understanding       Chronic Lung Disease Review: - Group verbal instruction with posters, models, PowerPoint presentations and videos,  to review new updates, new respiratory medications, new advancements in procedures and treatments. Providing information on websites and "800" numbers for continued self-education. Includes information about supplement oxygen, available portable oxygen systems, continuous and intermittent flow rates, oxygen safety, concentrators, and Medicare reimbursement for oxygen. Explanation of Pulmonary Drugs, including class, frequency, complications, importance of spacers, rinsing mouth after steroid MDI's, and proper cleaning methods for nebulizers. Review of basic lung anatomy and physiology related to function, structure, and complications of lung disease. Review of risk factors. Discussion about methods for diagnosing sleep apnea and types of masks and machines for OSA. Includes a review of the use of types of environmental controls: home humidity, furnaces, filters, dust mite/pet prevention, HEPA vacuums. Discussion about weather changes, air quality and the benefits of nasal washing. Instruction on Warning signs, infection symptoms, calling MD promptly, preventive modes, and value of vaccinations. Review of effective airway clearance, coughing and/or vibration techniques. Emphasizing that all should Create an Action Plan. Written material given at  graduation. Flowsheet Row Pulmonary Rehab from 06/16/2022 in [facility] Cardiac and Pulmonary Rehab  Education need identified 04/27/22  Date 05/12/22  Educator [facility]  Instruction Review Code 1- Verbalizes Understanding       AED/CPR: - Group verbal and written instruction with the use of models to  demonstrate the basic use of the AED with the basic ABC's of resuscitation.    Anatomy and Cardiac Procedures: - Group verbal and visual presentation and models provide information about basic cardiac anatomy and function. Reviews the testing methods done to diagnose heart disease and the outcomes of the test results. Describes the treatment choices: Medical Management, Angioplasty, or Coronary Bypass Surgery for treating various heart conditions including Myocardial Infarction, Angina, Valve Disease, and Cardiac Arrhythmias.  Written material given at graduation.   Medication Safety: - Group verbal and visual instruction to review commonly prescribed medications for heart and lung disease. Reviews the medication, class of the drug, and side effects. Includes the steps to properly store meds and maintain the prescription regimen.  Written material given at graduation.   Other: -Provides group and verbal instruction on various topics (see comments)   Knowledge Questionnaire Score:  Knowledge Questionnaire Score - 04/27/22 1422       Knowledge Questionnaire Score   Pre Score 16/18              Core Components/Risk Factors/Patient Goals at Admission:  Personal Goals and Risk Factors at Admission - 04/27/22 1424       Core Components/Risk Factors/Patient Goals on Admission    Weight Management Yes;Weight Loss    Intervention Weight Management: Develop a combined nutrition and exercise program designed to reach desired caloric intake, while maintaining appropriate intake of nutrient and fiber, sodium and fats, and appropriate energy expenditure required for the weight goal.;Weight  Management: Provide education and appropriate resources to help participant work on and attain dietary goals.;Weight Management/Obesity: Establish reasonable short term and long term weight goals.    Admit Weight 166 lb 11.2 oz (75.6 kg)    Goal Weight: Short Term 160 lb (72.6 kg)    Goal Weight: Long Term 155 lb (70.3 kg)    Expected Outcomes Short Term: Continue to assess and modify interventions until short term weight is achieved;Long Term: Adherence to nutrition and physical activity/exercise program aimed toward attainment of established weight goal;Weight Loss: Understanding of general recommendations for a balanced deficit meal plan, which promotes 1-2 lb weight loss per week and includes a negative energy balance of [id number] kcal/d;Understanding recommendations for meals to include 15-35% energy as protein, 25-35% energy from fat, 35-60% energy from carbohydrates, less than 200mg  of dietary cholesterol, 20-35 gm of total fiber daily;Understanding of distribution of calorie intake throughout the day with the consumption of 4-5 meals/snacks    Improve shortness of breath with ADL's Yes    Intervention Provide education, individualized exercise plan and daily activity instruction to help decrease symptoms of SOB with activities of daily living.    Expected Outcomes Short Term: Improve cardiorespiratory fitness to achieve a reduction of symptoms when performing ADLs;Long Term: Be able to perform more ADLs without symptoms or delay the onset of symptoms    Hypertension Yes    Intervention Provide education on lifestyle modifcations including regular physical activity/exercise, weight management, moderate sodium restriction and increased consumption of fresh fruit, vegetables, and low fat dairy, alcohol moderation, and smoking cessation.;Monitor prescription use compliance.    Expected Outcomes Short Term: Continued assessment and intervention until BP is < 140/24mm HG in hypertensive participants. <  130/74mm HG in hypertensive participants with diabetes, heart failure or chronic kidney disease.;Long Term: Maintenance of blood pressure at goal levels.             Education:Diabetes - Individual verbal and written instruction to review signs/symptoms of diabetes, desired  ranges of glucose level fasting, after meals and with exercise. Acknowledge that pre and post exercise glucose checks will be done for 3 sessions at entry of program.   Know Your Numbers and Heart Failure: - Group verbal and visual instruction to discuss disease risk factors for cardiac and pulmonary disease and treatment options.  Reviews associated critical values for Overweight/Obesity, Hypertension, Cholesterol, and Diabetes.  Discusses basics of heart failure: signs/symptoms and treatments.  Introduces Heart Failure Zone chart for action plan for heart failure.  Written material given at graduation.   Core Components/Risk Factors/Patient Goals Review:   Goals and Risk Factor Review     Row Name 05/19/22 1126 05/28/22 1108 06/30/22 1115         Core Components/Risk Factors/Patient Goals Review   Personal Goals Review Improve shortness of breath with ADL's Weight Management/Obesity Hypertension     Review Spoke to patient about their shortness of breath and what they can do to improve. Patient has been informed of breathing techniques when starting the program. Patient is informed to tell staff if they have had any med changes and that certain meds they are taking or not taking can be causing shortness of breath. Alysah has lost 15 pounds after her abdominal surgery from a bowel blockage. She is trying to eat healthier and lose more weight. Her weight goal is 150 pounds. Darra has had good blood pressure reading in the program. Sometimes she is in the lower 90s systolic. She is sometimes dizzy when her pressure gets low but is able to manage it. Her doctor is aware of her lower blood pressure readings. She checks her  pressure at home daily.     Expected Outcomes Short: Attend [facility] regularly to improve shortness of breath with ADL's. Long: maintain independence with ADL's Short: lose a few pounds in the next few weeks. Long; reach weight of 150 pounds. Short: check blood pressure readings at home. Long: maintain blood pressure readings independently.              Core Components/Risk Factors/Patient Goals at Discharge (Final Review):   Goals and Risk Factor Review - 06/30/22 1115       Core Components/Risk Factors/Patient Goals Review   Personal Goals Review Hypertension    Review Eathel has had good blood pressure reading in the program. Sometimes she is in the lower 90s systolic. She is sometimes dizzy when her pressure gets low but is able to manage it. Her doctor is aware of her lower blood pressure readings. She checks her pressure at home daily.    Expected Outcomes Short: check blood pressure readings at home. Long: maintain blood pressure readings independently.             ITP Comments:  ITP Comments     Row Name 04/26/22 1111 04/27/22 1421 05/03/22 1100 05/12/22 0948 06/09/22 0859   ITP Comments Virtual Visit completed. Patient informed on EP and RD appointment and 6 Minute walk test. Patient also informed of patient health questionnaires on My Chart. Patient Verbalizes understanding. Visit diagnosis can be found in [facility]/12/[facility]. Completed and gym orientation. Initial ITP created and sent for review to Dr. Vida Rigger, Medical Director. First full day of exercise!  Patient was oriented to gym and equipment including functions, settings, policies, and procedures.  Patient's individual exercise prescription and treatment plan were reviewed.  All starting workloads were established based on the results of the 6 minute walk test done at initial orientation visit.  The  plan for exercise progression was also introduced and progression will be customized based on patient's performance  and goals. 30 Day review completed. Medical Director ITP review done, changes made as directed, and signed approval by Medical Director.    new to program 30 Day review completed. Medical Director ITP review done, changes made as directed, and signed approval by Medical Director.    Row Name 07/07/22 0800 08/04/22 0751 09/01/22 0749 09/01/22 0750 09/01/22 1137   ITP Comments 30 day review completed. ITP sent to Dr. Jinny Sanders, Medical Director of  Pulmonary Rehab. Continue with ITP unless changes are made by physician. 30 Day review completed. Medical Director ITP review done, changes made as directed, and signed approval by Medical Director. Pt has not attended since 07/26/22.  Unable to assess goals this round. 30 day review completed. ITP sent to Dr. Jinny Sanders, Medical Director of  Pulmonary Rehab. Continue with ITP unless changes are made by physician. Left message for patient for pulmonary rehab asking for callback. Has not attended since 3/11.    Row Name 09/23/22 1154           ITP Comments A letter was sent to patient several weeks ago as patient has not attended rehab or returned any calls to staff. Patient has not attended since 3/11. Will discharge at this time due to no response back.                Comments: Discharge ITP

## 2022-09-28 ENCOUNTER — Ambulatory Visit
Admission: RE | Admit: 2022-09-28 | Discharge: 2022-09-28 | Disposition: A | Discharge status: Home / Self Care | Payer: Medicare PPO | Source: Ambulatory Visit | Attending: Pulmonary Disease | Admitting: Pulmonary Disease

## 2022-09-28 DIAGNOSIS — J849 Interstitial pulmonary disease, unspecified: Secondary | ICD-10-CM | POA: Diagnosis present

## 2022-10-19 ENCOUNTER — Ambulatory Visit
Admission: RE | Admit: 2022-10-19 | Discharge: 2022-10-19 | Disposition: A | Discharge status: Home / Self Care | Payer: Medicare PPO | Source: Ambulatory Visit | Attending: Internal Medicine | Admitting: Internal Medicine

## 2022-10-19 ENCOUNTER — Other Ambulatory Visit: Payer: Self-pay | Admitting: Internal Medicine

## 2022-10-19 DIAGNOSIS — M79604 Pain in right leg: Secondary | ICD-10-CM | POA: Insufficient documentation

## 2022-11-16 ENCOUNTER — Other Ambulatory Visit: Payer: Self-pay

## 2022-11-16 ENCOUNTER — Emergency Department: Payer: Medicare PPO

## 2022-11-16 ENCOUNTER — Emergency Department
Admission: EM | Admit: 2022-11-16 | Discharge: 2022-11-16 | Disposition: A | Discharge status: Home / Self Care | Payer: Medicare PPO | Attending: Emergency Medicine | Admitting: Emergency Medicine

## 2022-11-16 ENCOUNTER — Encounter: Payer: Self-pay | Admitting: Emergency Medicine

## 2022-11-16 DIAGNOSIS — R110 Nausea: Secondary | ICD-10-CM | POA: Insufficient documentation

## 2022-11-16 DIAGNOSIS — K921 Melena: Secondary | ICD-10-CM | POA: Diagnosis not present

## 2022-11-16 DIAGNOSIS — K625 Hemorrhage of anus and rectum: Secondary | ICD-10-CM | POA: Diagnosis present

## 2022-11-16 DIAGNOSIS — M545 Low back pain, unspecified: Secondary | ICD-10-CM | POA: Diagnosis not present

## 2022-11-16 LAB — COMPREHENSIVE METABOLIC PANEL
ALT: 18 U/L (ref 0–44)
AST: 48 U/L — ABNORMAL HIGH (ref 15–41)
Albumin: 4 g/dL (ref 3.5–5.0)
Alkaline Phosphatase: 61 U/L (ref 38–126)
Anion gap: 6 (ref 5–15)
BUN: 13 mg/dL (ref 8–23)
CO2: 24 mmol/L (ref 22–32)
Calcium: 9.1 mg/dL (ref 8.9–10.3)
Chloride: 111 mmol/L (ref 98–111)
Creatinine, Ser: 0.89 mg/dL (ref 0.44–1.00)
GFR, Estimated: >60 mL/min (ref >60)
Glucose, Bld: 95 mg/dL (ref 70–99)
Potassium: 3.7 mmol/L (ref 3.5–5.1)
Sodium: 141 mmol/L (ref 135–145)
Total Bilirubin: 0.6 mg/dL (ref 0.3–1.2)
Total Protein: 6.8 g/dL (ref 6.5–8.1)

## 2022-11-16 LAB — CBC
HCT: 37.3 % (ref 36.0–46.0)
Hemoglobin: 11.6 g/dL — ABNORMAL LOW (ref 12.0–15.0)
MCH: 27.8 pg (ref 26.0–34.0)
MCHC: 31.1 g/dL (ref 30.0–36.0)
MCV: 89.2 fL (ref 80.0–100.0)
Platelets: 258 10*3/uL (ref 150–400)
RBC: 4.18 MIL/uL (ref 3.87–5.11)
RDW: 17.9 % — ABNORMAL HIGH (ref 11.5–15.5)
WBC: 5.8 10*3/uL (ref 4.0–10.5)
nRBC: 0 % (ref 0.0–0.2)

## 2022-11-16 LAB — TYPE AND SCREEN
ABO/RH(D): B POS
Antibody Screen: NEGATIVE

## 2022-11-16 MED ORDER — DIPHENHYDRAMINE HCL 50 MG/ML IJ SOLN
12.5000 mg | INTRAMUSCULAR | Status: AC
  Administered 2022-11-16: 12.5 mg via INTRAVENOUS
  Filled 2022-11-16: qty 1

## 2022-11-16 MED ORDER — IOHEXOL 350 MG/ML SOLN
100.0000 mL | Freq: Once | INTRAVENOUS | Status: AC | PRN
  Administered 2022-11-16: 100 mL via INTRAVENOUS

## 2022-11-16 MED ORDER — KETOROLAC TROMETHAMINE 30 MG/ML IJ SOLN
15.0000 mg | Freq: Once | INTRAMUSCULAR | Status: AC
  Administered 2022-11-16: 15 mg via INTRAVENOUS
  Filled 2022-11-16: qty 1

## 2022-11-16 MED ORDER — DROPERIDOL 2.5 MG/ML IJ SOLN
2.5000 mg | Freq: Once | INTRAMUSCULAR | Status: AC
  Administered 2022-11-16: 2.5 mg via INTRAVENOUS
  Filled 2022-11-16: qty 2

## 2022-11-16 MED ORDER — ONDANSETRON HCL 4 MG/2ML IJ SOLN
4.0000 mg | INTRAMUSCULAR | Status: AC
  Administered 2022-11-16: 4 mg via INTRAVENOUS
  Filled 2022-11-16: qty 2

## 2022-11-16 NOTE — Discharge Instructions (Addendum)
You have been seen in the Emergency Department (ED) for GI bleeding.  Your evaluation did not identify a clear cause of your symptoms but was generally reassuring.  You do not require admission to the hospital at this time, but we encourage close outpatient follow up with the doctor(s) listed above in this paperwork. ° °Return to the ED if your abdominal pain worsens or fails to improve, you are unable to tolerate fluids due to vomiting, fever greater than 101, or you develop other symptoms that concern you. ° °

## 2022-11-16 NOTE — ED Provider Notes (Signed)
[facility] Provider Note    Event Date/Time   First MD Initiated Contact with Patient 11/16/22 [phone]     (approximate)   History   Rectal Bleeding   HPI Amy Huynh is a 67 y.o. female who presents for evaluation of rectal bleeding.  She said that for a few weeks she thinks that she has had some passed sometimes blood in it come out of her anus from time to time.  More recently she started to have some intermittent lower abdominal cramping.  Over the last couple of days she has had some mild low back pain and she has had nausea with bright red blood in her stool.  She has had no lightheadedness or dizziness, no passing out, no fever.  She denies chest pain or shortness of breath.  No vomiting.  She is established with Dr. Servando Snare for GI care but has not spoken with him about these concerns.     Physical Exam   Triage Vital Signs: ED Triage Vitals  Enc Vitals Group     BP 11/16/22 0245 (!) 147/78     Pulse Rate 11/16/22 0245 69     Resp 11/16/22 0245 20     Temp 11/16/22 0245 97.8 F (36.6 C)     Temp Source 11/16/22 0245 Oral     SpO2 11/16/22 0245 100 %     Weight 11/16/22 0246 73.9 kg (163 lb)     Height 11/16/22 0246 1.575 m (5\' 2" )     Head Circumference --      Peak Flow --      Pain Score 11/16/22 0249 6     Pain Loc --      Pain Edu? --      Excl. in GC? --     Most recent vital signs: Vitals:   11/16/22 0245 11/16/22 0645  BP: (!) 147/78 122/80  Pulse: 69 73  Resp: 20 10  Temp: 97.8 F (36.6 C)   SpO2: 100% 97%    General: Awake, no distress.  Generally well-appearing. CV:  Good peripheral perfusion.  No tachycardia. Resp:  Normal effort. Speaking easily and comfortably, no accessory muscle usage nor intercostal retractions.   Abd:  No distention.  No tenderness to palpation of the abdomen. Other:  Rectal exam is notable for no external abnormalities including no evidence of external hemorrhoids nor perirectal or  perianal abscess.  Digital exam was uncomfortable but not painful.  There is a small amount of bright red blood on my finger during the exam which was strongly heme positive.  Nurse was present as chaperone throughout exam.   ED Results / Procedures / Treatments   Labs (all labs ordered are listed, but only abnormal results are displayed) Labs Reviewed  COMPREHENSIVE METABOLIC PANEL - Abnormal; Notable for the following components:      Result Value   AST 48 (*)    All other components within normal limits  CBC - Abnormal; Notable for the following components:   Hemoglobin 11.6 (*)    RDW 17.9 (*)    All other components within normal limits  TYPE AND SCREEN     EKG  ED ECG REPORT I, Loleta Rose, the attending physician, personally viewed and interpreted this ECG.  Date: 11/16/2022 EKG Time: 4:52 AM Rate: 59 Rhythm: Borderline sinus bradycardia QRS Axis: normal Intervals: normal ST/T Wave abnormalities: Non-specific ST segment / T-wave changes, but no clear evidence of acute ischemia. Narrative Interpretation: no  definitive evidence of acute ischemia; does not meet STEMI criteria.    RADIOLOGY CT angio GI bleed protocol: See hospital course for details   PROCEDURES:  Critical Care performed: No  .1-3 Lead EKG Interpretation  Performed by: Loleta Rose, MD Authorized by: Loleta Rose, MD     Interpretation: normal     ECG rate:  60   ECG rate assessment: normal     Rhythm: sinus rhythm     Ectopy: none     Conduction: normal       IMPRESSION / MDM / ASSESSMENT AND PLAN / ED COURSE  I reviewed the triage vital signs and the nursing notes.                              Differential diagnosis includes, but is not limited to, diverticular bleed, hemorrhoids, AV malformation, neoplasm, upper GI bleed source such as ulcer.  Patient's presentation is most consistent with acute presentation with potential threat to life or bodily function.  Labs/studies  ordered: CTA GI bleed protocol, EKG, CBC, CMP, type and screen (in case patient requires transfusion)  Interventions/Medications given:  Medications  ondansetron (ZOFRAN) injection 4 mg (4 mg Intravenous Given 11/16/22 0457)  droperidol (INAPSINE) 2.5 MG/ML injection 2.5 mg (2.5 mg Intravenous Given 11/16/22 0522)  diphenhydrAMINE (BENADRYL) injection 12.5 mg (12.5 mg Intravenous Given 11/16/22 0522)  ketorolac (TORADOL) 30 MG/ML injection 15 mg (15 mg Intravenous Given 11/16/22 0522)  iohexol (OMNIPAQUE) 350 MG/ML injection 100 mL (100 mLs Intravenous Contrast Given 11/16/22 0524)    (Note:  hospital course my include additional interventions and/or labs/studies not listed above.)   Patient is well-appearing with normal vital signs.  Lab work is stable from prior including her hemoglobin despite her history of present illness.  Given the constellation of symptoms including the somewhat confusing description of "pus" despite no physical signs of abnormality, I will proceed with CTA GI bleed to evaluate for the possibility of intra-abdominal fistula or abscess as well as a possible source of her GI bleed.  However given her overall stable status, I suspect she will be appropriate for discharge and outpatient follow-up with Dr. Servando Snare as long as we do not identify an emergent finding that requires immediate intervention.  She continued to feel nauseated after Zofran 4 mg IV so I also provided droperidol 2.5 mg IV along with Benadryl 12.5 mg IV, and ketorolac 50 mg IV to help with her discomfort and her lower abdomen and back.  She has no respiratory difficulties and no chest pain.   The patient is on the cardiac monitor to evaluate for evidence of arrhythmia and/or significant heart rate changes.   Clinical Course as of 11/16/22 0820  Tue Nov 16, 2022  1610 CT ANGIO GI BLEED I viewed and interpreted the patient's CTA GI bleed and I do not see any evidence of an acute or emergent issue nor source of  bleeding.  The radiologist report confirms no obvious acute abnormality.  Upon further review of the patient's labs and vital signs, she is very stable.  I considered hospitalization, but there is no indication she would benefit from immediate hospitalization.  She is already established with Dr. Servando Snare with GI, and I will send him a message through [facility] as well.  I offered hospitalization to the patient but she agrees that she would rather follow-up as an outpatient.  I gave my usual and customary return precautions and she  agrees with the plan.  She is currently asymptomatic, has had no additional rectal bleeding since coming to the hospital, and has been ambulatory to and from the restroom without any lightheadedness or dizziness. [CF]    Clinical Course User Index [CF] Loleta Rose, MD     FINAL CLINICAL IMPRESSION(S) / ED DIAGNOSES   Final diagnoses:  Hematochezia  Nausea     Rx / DC Orders   ED Discharge Orders     None        Note:  This document was prepared using Dragon voice recognition software and may include unintentional dictation errors.   Loleta Rose, MD 11/16/22 [phone]

## 2022-11-16 NOTE — ED Notes (Signed)
Patient Alert and oriented to baseline. Stable and ambulatory to baseline. Patient verbalized understanding of the discharge instructions.  Patient belongings were taken by the patient.   

## 2022-11-16 NOTE — ED Triage Notes (Signed)
Pt arrived via POV with reports of bright red blood when having BM, pt has GI provider already, pt reports abd pain and nausea.  PT c/o low back pain as well.  PT reports over the past couple of weeks she has noticed some pus with blood tinge when having BM.  Pt also reporting pressure and feeling the urge to urinate but also feels the urge to have BM  Pt had blockage in Oct and had surgery.

## 2022-11-17 ENCOUNTER — Telehealth: Payer: Self-pay | Admitting: Gastroenterology

## 2022-11-17 ENCOUNTER — Encounter: Payer: Self-pay | Admitting: Gastroenterology

## 2022-11-17 NOTE — Telephone Encounter (Signed)
Patient called in because of her rectal bleeding. This is the message she sent to Dr. Servando Snare I was seen in the ER on 7/2. I went because I suddenly had large amounts of blood with clots when I went to the bathroom that began on the evening of 7/1 and continued into the morning of 7/2. I had a small amount of dark stool but this was this first time I had dark stool. I was concerned because of the large amounts of blood in the toilet bowl and feeling nauseous. I slept all day yesterday after returning home which is why I didn't contact you yesterday for follow-up. I did have another episode of blood in the toilet bowl last night but have not had any since. A CT scan was performed and didn't find anything at the hospital. Dr York Cerise the ER Dr did a rectal exam and did confirm  I had blood although at the time of the exam it was not a large amount gushing. He gave me the option of staying at the hospital or coming home to follow up with you. I chose to come home and follow up with you. I will also try and call your office shortly once it opens.     Thank you and I understand this is the week of the 4th of July so not sure what can be done this week.   Amy Huynh  She stated that she is still having a good of amount of blood in her stool with clots in it. I advised her to go to the hospital. She said she is going to try to see if she can hold off until Tuesday but if not she will go back to the ER.

## 2022-11-21 ENCOUNTER — Other Ambulatory Visit: Payer: Self-pay

## 2022-11-21 ENCOUNTER — Emergency Department: Payer: Medicare PPO

## 2022-11-21 ENCOUNTER — Observation Stay
Admission: EM | Admit: 2022-11-21 | Discharge: 2022-11-23 | Disposition: A | Discharge status: Home / Self Care | Payer: Medicare PPO | Attending: Internal Medicine | Admitting: Internal Medicine

## 2022-11-21 DIAGNOSIS — K921 Melena: Secondary | ICD-10-CM | POA: Diagnosis not present

## 2022-11-21 DIAGNOSIS — Z96652 Presence of left artificial knee joint: Secondary | ICD-10-CM | POA: Insufficient documentation

## 2022-11-21 DIAGNOSIS — D62 Acute posthemorrhagic anemia: Secondary | ICD-10-CM | POA: Diagnosis not present

## 2022-11-21 DIAGNOSIS — N1831 Chronic kidney disease, stage 3a: Secondary | ICD-10-CM | POA: Diagnosis not present

## 2022-11-21 DIAGNOSIS — L409 Psoriasis, unspecified: Secondary | ICD-10-CM

## 2022-11-21 DIAGNOSIS — K625 Hemorrhage of anus and rectum: Secondary | ICD-10-CM | POA: Diagnosis present

## 2022-11-21 DIAGNOSIS — M1990 Unspecified osteoarthritis, unspecified site: Secondary | ICD-10-CM

## 2022-11-21 DIAGNOSIS — M3500 Sicca syndrome, unspecified: Secondary | ICD-10-CM | POA: Diagnosis present

## 2022-11-21 DIAGNOSIS — G629 Polyneuropathy, unspecified: Secondary | ICD-10-CM

## 2022-11-21 DIAGNOSIS — K640 First degree hemorrhoids: Secondary | ICD-10-CM | POA: Diagnosis not present

## 2022-11-21 DIAGNOSIS — K5730 Diverticulosis of large intestine without perforation or abscess without bleeding: Principal | ICD-10-CM | POA: Insufficient documentation

## 2022-11-21 DIAGNOSIS — Z79899 Other long term (current) drug therapy: Secondary | ICD-10-CM | POA: Diagnosis not present

## 2022-11-21 DIAGNOSIS — M797 Fibromyalgia: Secondary | ICD-10-CM | POA: Diagnosis not present

## 2022-11-21 DIAGNOSIS — K922 Gastrointestinal hemorrhage, unspecified: Secondary | ICD-10-CM | POA: Insufficient documentation

## 2022-11-21 DIAGNOSIS — K5731 Diverticulosis of large intestine without perforation or abscess with bleeding: Secondary | ICD-10-CM | POA: Diagnosis present

## 2022-11-21 HISTORY — DX: Other intervertebral disc degeneration, lumbar region: M51.36

## 2022-11-21 HISTORY — DX: Other cervical disc degeneration, unspecified cervical region: M50.30

## 2022-11-21 HISTORY — DX: Other intervertebral disc degeneration, lumbar region without mention of lumbar back pain or lower extremity pain: M51.369

## 2022-11-21 LAB — CBC
HCT: 39.4 % (ref 36.0–46.0)
Hemoglobin: 12.2 g/dL (ref 12.0–15.0)
MCH: 27.7 pg (ref 26.0–34.0)
MCHC: 31 g/dL (ref 30.0–36.0)
MCV: 89.5 fL (ref 80.0–100.0)
Platelets: 229 10*3/uL (ref 150–400)
RBC: 4.4 MIL/uL (ref 3.87–5.11)
RDW: 18.1 % — ABNORMAL HIGH (ref 11.5–15.5)
WBC: 7.5 10*3/uL (ref 4.0–10.5)
nRBC: 0 % (ref 0.0–0.2)

## 2022-11-21 LAB — COMPREHENSIVE METABOLIC PANEL
ALT: 10 U/L (ref 0–44)
AST: 15 U/L (ref 15–41)
Albumin: 3.5 g/dL (ref 3.5–5.0)
Alkaline Phosphatase: 55 U/L (ref 38–126)
Anion gap: 9 (ref 5–15)
BUN: 15 mg/dL (ref 8–23)
CO2: 21 mmol/L — ABNORMAL LOW (ref 22–32)
Calcium: 8.9 mg/dL (ref 8.9–10.3)
Chloride: 108 mmol/L (ref 98–111)
Creatinine, Ser: 0.91 mg/dL (ref 0.44–1.00)
GFR, Estimated: >60 mL/min (ref >60)
Glucose, Bld: 116 mg/dL — ABNORMAL HIGH (ref 70–99)
Potassium: 3.5 mmol/L (ref 3.5–5.1)
Sodium: 138 mmol/L (ref 135–145)
Total Bilirubin: 0.4 mg/dL (ref 0.3–1.2)
Total Protein: 6.6 g/dL (ref 6.5–8.1)

## 2022-11-21 LAB — TYPE AND SCREEN
ABO/RH(D): B POS
Antibody Screen: NEGATIVE

## 2022-11-21 LAB — HEMOGLOBIN AND HEMATOCRIT, BLOOD
HCT: 37.3 % (ref 36.0–46.0)
HCT: 37.7 % (ref 36.0–46.0)
Hemoglobin: 11.6 g/dL — ABNORMAL LOW (ref 12.0–15.0)
Hemoglobin: 11.5 g/dL — ABNORMAL LOW (ref 12.0–15.0)

## 2022-11-21 MED ORDER — GABAPENTIN 300 MG PO CAPS: 600.0000 mg | ORAL_CAPSULE | Freq: Every day | ORAL | Status: DC | PRN

## 2022-11-21 MED ORDER — SODIUM CHLORIDE 0.9 % IV SOLN: INTRAVENOUS | Status: DC

## 2022-11-21 MED ORDER — APREMILAST 30 MG PO TABS
30.0000 mg | ORAL_TABLET | Freq: Two times a day (BID) | ORAL | Status: DC
  Administered 2022-11-21 – 2022-11-23 (×3): 30 mg via ORAL
  Filled 2022-11-21 (×4): qty 1

## 2022-11-21 MED ORDER — IOHEXOL 300 MG/ML  SOLN
100.0000 mL | Freq: Once | INTRAMUSCULAR | Status: AC | PRN
  Administered 2022-11-21: 100 mL via INTRAVENOUS

## 2022-11-21 MED ORDER — PILOCARPINE HCL 5 MG PO TABS
5.0000 mg | ORAL_TABLET | Freq: Two times a day (BID) | ORAL | Status: DC
  Administered 2022-11-21 – 2022-11-23 (×3): 5 mg via ORAL
  Filled 2022-11-21 (×4): qty 1

## 2022-11-21 MED ORDER — FOLIC ACID 1 MG PO TABS
1.0000 mg | ORAL_TABLET | Freq: Every day | ORAL | Status: DC
  Administered 2022-11-23: 1 mg via ORAL
  Filled 2022-11-21: qty 1

## 2022-11-21 MED ORDER — ONDANSETRON HCL 4 MG PO TABS: 4.0000 mg | ORAL_TABLET | Freq: Four times a day (QID) | ORAL | Status: DC | PRN

## 2022-11-21 MED ORDER — ROPINIROLE HCL 1 MG PO TABS
2.0000 mg | ORAL_TABLET | Freq: Every evening | ORAL | Status: DC
  Administered 2022-11-21 – 2022-11-22 (×2): 2 mg via ORAL
  Filled 2022-11-21 (×2): qty 2

## 2022-11-21 MED ORDER — METHOTREXATE SODIUM 2.5 MG PO TABS: 10.0000 mg | ORAL_TABLET | ORAL | Status: DC

## 2022-11-21 MED ORDER — SODIUM CHLORIDE 0.9 % IV BOLUS
1000.0000 mL | Freq: Once | INTRAVENOUS | Status: AC
  Administered 2022-11-21: 1000 mL via INTRAVENOUS

## 2022-11-21 MED ORDER — PANTOPRAZOLE SODIUM 40 MG PO TBEC
40.0000 mg | DELAYED_RELEASE_TABLET | Freq: Every day | ORAL | Status: DC
  Administered 2022-11-22 – 2022-11-23 (×2): 40 mg via ORAL
  Filled 2022-11-21 (×2): qty 1

## 2022-11-21 MED ORDER — TOPIRAMATE 100 MG PO TABS
200.0000 mg | ORAL_TABLET | Freq: Every day | ORAL | Status: DC
  Administered 2022-11-21 – 2022-11-22 (×2): 200 mg via ORAL
  Filled 2022-11-21 (×2): qty 2

## 2022-11-21 MED ORDER — OXYCODONE HCL 5 MG PO TABS
5.0000 mg | ORAL_TABLET | ORAL | Status: DC | PRN
  Administered 2022-11-21: 5 mg via ORAL
  Filled 2022-11-21: qty 1

## 2022-11-21 MED ORDER — ONDANSETRON HCL 4 MG/2ML IJ SOLN
4.0000 mg | Freq: Four times a day (QID) | INTRAMUSCULAR | Status: DC | PRN
  Administered 2022-11-21: 4 mg via INTRAVENOUS
  Filled 2022-11-21: qty 2

## 2022-11-21 MED ORDER — METHOTREXATE 2.5 MG PO TABS: 20.0000 mg | ORAL_TABLET | ORAL | Status: DC

## 2022-11-21 MED ORDER — ONDANSETRON HCL 4 MG/2ML IJ SOLN
4.0000 mg | Freq: Once | INTRAMUSCULAR | Status: AC
  Administered 2022-11-21: 4 mg via INTRAVENOUS
  Filled 2022-11-21: qty 2

## 2022-11-21 MED ORDER — GABAPENTIN 300 MG PO CAPS
900.0000 mg | ORAL_CAPSULE | Freq: Every day | ORAL | Status: DC
  Administered 2022-11-21 – 2022-11-22 (×2): 900 mg via ORAL
  Filled 2022-11-21 (×2): qty 3

## 2022-11-21 MED ORDER — GABAPENTIN 300 MG PO CAPS
300.0000 mg | ORAL_CAPSULE | Freq: Every day | ORAL | Status: DC
  Administered 2022-11-23: 300 mg via ORAL
  Filled 2022-11-21: qty 1

## 2022-11-21 MED ORDER — PEG 3350-KCL-NA BICARB-NACL 420 G PO SOLR
4000.0000 mL | Freq: Once | ORAL | Status: AC
  Administered 2022-11-21: 4000 mL via ORAL
  Filled 2022-11-21 (×2): qty 4000

## 2022-11-21 NOTE — ED Notes (Signed)
Pt c/o tenderness at IV site with associated redness following the path of the vein. Area is flat and smooth when palpated. IV site changed to prevent further irritation to vein.

## 2022-11-21 NOTE — Consult Note (Signed)
GI Inpatient Consult Note  Reason for Consult: Hematochezia/LGIB   Attending Requesting Consult: Dr. Shaune Pollack, MD  History of Present Illness: Amy Huynh is a 67 y.o. female seen for evaluation of hematochezia and acute LGIB at the request of ED physician - Dr. Shaune Pollack. Patient has a PMH of migraines, fibromyalgia, OA, Sjogren's disease, osteopenia, CKD Stage II, psoriasis, GAD, and colonic diverticulosis. She presented to the [facility] ED this person for chief complaint of hematochezia and acute lower GI bleeding. She reports over the past week she has been experiencing 1-2 episodes of hematochezia a day. Last episode was bright red blood with clots this morning before coming to the ED. Upon presentation to the ED, all vital signs were stable. Hemoglobin 12.2 with normal serum electrolytes and normal renal function. CT abd/pelvis was negative for any acute abnormalities. There was mention of distal colonic diverticulosis without evidence for diverticulitis. GI consulted for further evaluation and management.   Patient seen and examined this morning in the ED. Her husband is bedside. She has had no further episodes of hematochezia since being in the ED. She was seen and evaluated at the [facility] ED 7/2 for hematochezia where she had hemoglobin 11.6 and negative CTA. Admission for observation was offered versus outpatient management and she chose observation. She tried to get appointment with Dr. Servando Snare and got one scheduled for next Tuesday. However, the bleeding got really bad and wouldn't let up this weekend so she decided to come to the ED. She has had hx of intermittent BRBPR over the years which has been attributed to hemorrhoids. She last saw Dr. Servando Snare in Jan 2024 for GERD and abdominal pain. She had EGD 06/2021 which showed 2 non-bleeding cratered gastric ulcers. Last colonoscopy reportedly 2017 at [facility] with one adenomatous polyp removed and otherwise normal. She reports she did  have a fall of a swing ~2 weeks ago and landed on her left hip. She went to [facility] last weekend and was given a steroid taper (60 mg x 3 days, 40 mg x 3 days, 20 mg x 3 days). She did take 20 mg prednisone taper. She does not take NSAIDs at home. She does endorse some generalized crampy abdominal pain in her LLQ. She denies any fevers, chills, nausea, or vomiting.    Summary of GI Procedures:  EGD 06/26/2021 ([facility]) - irregular Z-line at [facility], small hiatal hernia two non-bleeding cratered gastric ulcer, normal duodenum   CSY 12/30/2015 ([facility]) - normal appearing terminal ileum, normal mucosa in entire examined colon, sigmoid diverticulosis. 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   Melena    Heartburn    Acute peptic ulcer of stomach    Bradycardia 10/16/2020   Stage 3a chronic kidney disease (HCC) 06/24/2020   Abnormal ECG 05/22/2020   History of degenerative disc disease 01/29/2020   LVH (left ventricular hypertrophy) 11/15/2019   Acute respiratory failure with hypoxia (HCC) 09/16/2019   Multilobar lung infiltrate 09/16/2019   Hypomagnesemia 09/16/2019  Muscle weakness (generalized) 09/16/2019   Community acquired pneumonia 09/12/2019   Hypokalemia 09/12/2019   Psoriasis 02/15/2019   Personal history of skin cancer 12/01/2018   Sjogren's syndrome (HCC) 10/11/2017   Family history of ovarian cancer 01/11/2017   Acute bursitis of right shoulder 06/07/2016   Partial tear of right rotator cuff 06/07/2016   Lumbar radiculopathy 07/23/2015    Migraine without aura and without status migrainosus, not intractable 04/23/2015   Chronic daily headache 04/23/2015   DDD (degenerative disc disease), cervical 07/06/2014   Neck pain 07/06/2014   Dyspareunia 12/14/2013   Benign essential hypertension 09/13/2013   Polyarthralgia 07/20/2013   Diverticula of colon 01/22/2013   Fibromyalgia 01/22/2013   Restless legs syndrome 01/22/2013   Moderate obstructive sleep apnea 01/22/2013   Migraine headache 01/22/2013   Insomnia 01/22/2013   Generalized anxiety disorder 01/22/2013   Esophageal reflux 01/22/2013   Atrophic vaginitis 01/22/2013   Anemia 01/22/2013   Allergic rhinitis 01/22/2013   Urticaria 01/18/2013   Low back pain 10/03/2012   Lumbosacral spondylosis 08/17/2012    Past Surgical History: Past Surgical History:  Procedure Laterality Date   BIOPSY N/A 06/26/2021   Procedure: BIOPSY;  Surgeon: Midge Minium, MD;  Location: [facility];  Service: Endoscopy;  Laterality: N/A;   CERVICAL FUSION     CESAREAN SECTION     CHOLECYSTECTOMY     ESOPHAGOGASTRODUODENOSCOPY (EGD) WITH PROPOFOL N/A 06/26/2021   Procedure: ESOPHAGOGASTRODUODENOSCOPY (EGD) WITH PROPOFOL;  Surgeon: Midge Minium, MD;  Location: [facility];  Service: Endoscopy;  Laterality: N/A;   HERNIA REPAIR     LAPAROTOMY  03/11/2022   Procedure: EXPLORATORY LAPAROTOMY Mesenteric mass biopsy;  Surgeon: Sung Amabile, DO;  Location: [facility] OR[facility];  Service: General;;   REPLACEMENT TOTAL KNEE Left    SHOULDER SURGERY Right     Allergies: Allergies  Allergen Reactions   Hydrocodone-Acetaminophen     Other reaction(s): Other (See Comments) Severe headache   Hydrocodone-Acetaminophen     Other reaction(s): Headache, Other (See Comments) Severe headache Severe headache  Other reaction(s): Headache Severe headache Other reaction(s): Headache, Other (See Comments) Severe headache Severe headache   Morphine Itching and Other (See Comments)    Other  reaction(s): Other (See Comments) Morphine in high doses causes itching & red face. Morphine in high doses causes itching & red face.  Morphine in high doses causes itching & red face. Other reaction(s): Other (See Comments) Morphine in high doses causes itching & red face. Morphine in high doses causes itching & red face. Morphine in high doses causes itching & red face.   Morphine And Codeine Itching    Morphine in high doses causes itching & red face.    Cymbalta [Duloxetine Hcl] Other (See Comments)    Serotonin Reaction   Doxycycline     Other reaction(s): Abdominal Pain GI intolerance   Duloxetine Other (See Comments)    Other reaction(s): Other (See Comments), Other (See Comments) Serotonin syndrome Serotonin reaction  Serotonin syndrome Other reaction(s): Other (See Comments), Other (See Comments) Serotonin syndrome Serotonin reaction   Pregabalin Other (See Comments)    Other reaction(s): Other (See Comments) Serotinon reaction serotinon reaction  Serotinon reaction Other reaction(s): Other (See Comments) Serotinon reaction serotinon reaction    Home Medications: (Not in a hospital admission)  Home medication reconciliation was completed with the patient.   Family History: family history includes COPD in her mother; Heart attack (age of onset: 23) in her father; Hypertension in her mother; Other in her father;  Ovarian cancer (age of onset: 61) in her maternal aunt; Uterine cancer in her maternal grandmother and mother.  The patient's family history is negative for inflammatory bowel disorders, GI malignancy, or solid organ transplantation.  Social History:   reports that she has never smoked. She has never used smokeless tobacco. She reports that she does not currently use alcohol. She reports that she does not use drugs. The patient denies ETOH, tobacco, or drug use.   Review of Systems: Constitutional: Weight is stable.  Eyes: No changes in vision. ENT: No  oral lesions, sore throat.  GI: see HPI.  Heme/Lymph: No easy bruising.  CV: No chest pain.  GU: No hematuria.  Integumentary: No rashes.  Neuro: No headaches.  Psych: No depression/anxiety.  Endocrine: No heat/cold intolerance.  Allergic/Immunologic: No urticaria.  Resp: No cough, SOB.  Musculoskeletal: No joint swelling.    Physical Examination: BP 139/65   Pulse (!) 55   Temp 98.3 F (36.8 C) (Oral)   Resp 15   SpO2 100%  Gen: NAD, alert and oriented x 4 HEENT: PEERLA, EOMI, Neck: supple, no JVD or thyromegaly Chest: CTA bilaterally, no wheezes, crackles, or other adventitious sounds CV: RRR, no m/g/c/r Abd: soft, NT, ND, +BS in all four quadrants; no HSM, guarding, ridigity, or rebound tenderness Ext: no edema, well perfused with 2+ pulses, Skin: no rash or lesions noted Lymph: no LAD  Data: Lab Results  Component Value Date   WBC 7.5 11/21/2022   HGB 12.2 11/21/2022   HCT 39.4 11/21/2022   MCV 89.5 11/21/2022   PLT 229 11/21/2022   Recent Labs  Lab 11/16/22 0441 11/21/22 0849  HGB 11.6* 12.2   Lab Results  Component Value Date   NA 138 11/21/2022   K 3.5 11/21/2022   CL 108 11/21/2022   CO2 21 (L) 11/21/2022   BUN 15 11/21/2022   CREATININE 0.91 11/21/2022   Lab Results  Component Value Date   ALT 10 11/21/2022   AST 15 11/21/2022   ALKPHOS 55 11/21/2022   BILITOT 0.4 11/21/2022   No results for input(s): "APTT", "INR", "PTT" in the last 168 hours.  CT abd/pelvis with contrast 11/21/2022: IMPRESSION: 1. No acute or interval finding. 2. Mild distal colonic diverticulosis.  CTA abd/pelvis without and with contrast 11/16/2022: IMPRESSION: 1. No acute vascular finding. No evidence of active bleeding or inflammation. 2. Mild distal colonic diverticulosis.  Assessment/Plan:  67 y/o Caucasian female with a PMH of migraines, fibromyalgia, OA, Sjogren's disease, osteopenia, CKD Stage II, psoriasis, GAD, and colonic diverticulosis presented to the  [facility] ED this morning for chief complaint of 1-week history of hematochezia c/w acute LGIB  Hematochezia/LGIB - clinical presentation highly c/w diverticular bleed. Differential also includes anal outlet etiology, colitis, colon polyp, neoplasm, AVMs, etc. Hemoglobin within normal limits at 12.2 with no evidence of active GI bleeding. Contrasted CT Scan without any acute abnormalities this AM.   Sigmoid diverticulosis  Hx of adenomatous polyp of colon  Recommendations:  - Continue to monitor serial H&H. Transfuse for Hgb <7.0.  - Continue supportive care with IV fluids, pain control, and antiemetics per primary team - No signs of active GI bleeding at this time - Avoid NSAIDs - If there are multiple episodes of hematochezia today, recommend repeat CTA abd/pelvis or NM GI Bleeding scan to help localize active bleed - Advise colonoscopy tomorrow with Dr. Norma Fredrickson - Clear liquid diet today. NPO midnight. Bowel prep orders placed.  - Further recommendations after colonoscopy - Following  along with you  I reviewed the risks (including bleeding, perforation, infection, anesthesia complications, cardiac/respiratory complications), benefits and alternatives of colonoscopy. Patient consents to proceed.   Thank you for the consult. Please call with questions or concerns.  Gilda Crease, PA-C [facility] [phone]

## 2022-11-21 NOTE — Assessment & Plan Note (Signed)
Creatinine 0.91 Appears at baseline Follow

## 2022-11-21 NOTE — ED Notes (Signed)
Advised nurse that patient has ready bed 

## 2022-11-21 NOTE — Assessment & Plan Note (Signed)
Continue Neurontin and Topamax

## 2022-11-21 NOTE — Assessment & Plan Note (Signed)
Continue home [place]

## 2022-11-21 NOTE — H&P (Addendum)
History and Physical    Patient: Amy Huynh ZOX:[id number] DOB: 1956-03-14 DOA: 11/21/2022 DOS: the patient was seen and examined on 11/21/2022 PCP: Enid Baas, MD  Patient coming from: Home  Chief Complaint:  Chief Complaint  Patient presents with   Rectal Bleeding   HPI: Amy Huynh is a 67 y.o. female with medical history significant of multiple medical issues including Sjogren's disease, fibromyalgia, psoriasis, neuropathy presenting with rectal bleeding.  Patient reports rectal bleeding for the past 2-3 weeks.  Has had episodes of mild generalized abdominal pain and nausea.  Was initially evaluated on July 2 with noted rectal bleeding.  Admission was initially offered.  However, patient declined.  Per report, patient had establish care with Dr. Servando Snare for GI with plan to get outpatient evaluation.  However, this was around the time of 4 July with no appropriate follow-up.  Since then, patient had 2-3 episodes of gross bloody bowel movements.  Positive malaise.  Patient denies any antiplatelet, NSAID or anticoagulant use.  No prior history of GI bleeding.  Does report remote history of?  Gastric versus small bowel stricture status post prior endoscopy. Presented to the ER afebrile, hemodynamically stable.  Satting well on room air.  White count 7.5, hemoglobin 12.2, platelets 229.  Creatinine 0.9.  CT abdomen pelvis with contrast grossly stable. Review of Systems: As mentioned in the history of present illness. All other systems reviewed and are negative. Past Medical History:  Diagnosis Date   Arthritis    Family history of ovarian cancer 12/2016   MyRisk neg   Fibromyalgia    Migraine    Neuropathy    Placenta previa    Sjogren's disease [facility])    Past Surgical History:  Procedure Laterality Date   BIOPSY N/A 06/26/2021   Procedure: BIOPSY;  Surgeon: Midge Minium, MD;  Location: [facility];  Service: Endoscopy;  Laterality: N/A;   CERVICAL  FUSION     CESAREAN SECTION     CHOLECYSTECTOMY     ESOPHAGOGASTRODUODENOSCOPY (EGD) WITH PROPOFOL N/A 06/26/2021   Procedure: ESOPHAGOGASTRODUODENOSCOPY (EGD) WITH PROPOFOL;  Surgeon: Midge Minium, MD;  Location: [facility];  Service: Endoscopy;  Laterality: N/A;   HERNIA REPAIR     LAPAROTOMY  03/11/2022   Procedure: EXPLORATORY LAPAROTOMY Mesenteric mass biopsy;  Surgeon: Sung Amabile, DO;  Location: [facility];  Service: General;;   REPLACEMENT TOTAL KNEE Left    SHOULDER SURGERY Right    Social History:  reports that she has never smoked. She has never used smokeless tobacco. She reports that she does not currently use alcohol. She reports that she does not use drugs.  Allergies  Allergen Reactions   Hydrocodone-Acetaminophen     Other reaction(s): Other (See Comments) Severe headache   Hydrocodone-Acetaminophen     Other reaction(s): Headache, Other (See Comments) Severe headache Severe headache  Other reaction(s): Headache Severe headache Other reaction(s): Headache, Other (See Comments) Severe headache Severe headache   Morphine Itching and Other (See Comments)    Other reaction(s): Other (See Comments) Morphine in high doses causes itching & red face. Morphine in high doses causes itching & red face.  Morphine in high doses causes itching & red face. Other reaction(s): Other (See Comments) Morphine in high doses causes itching & red face. Morphine in high doses causes itching & red face. Morphine in high doses causes itching & red face.   Morphine And Codeine Itching    Morphine in high doses causes itching & red face.  Cymbalta [Duloxetine Hcl] Other (See Comments)    Serotonin Reaction   Doxycycline     Other reaction(s): Abdominal Pain GI intolerance   Duloxetine Other (See Comments)    Other reaction(s): Other (See Comments), Other (See Comments) Serotonin syndrome Serotonin reaction  Serotonin syndrome Other reaction(s): Other (See Comments),  Other (See Comments) Serotonin syndrome Serotonin reaction   Pregabalin Other (See Comments)    Other reaction(s): Other (See Comments) Serotinon reaction serotinon reaction  Serotinon reaction Other reaction(s): Other (See Comments) Serotinon reaction serotinon reaction    Family History  Problem Relation Age of Onset   Ovarian cancer Maternal Aunt 30   Uterine cancer Mother        57s   COPD Mother    Hypertension Mother    Uterine cancer Maternal Grandmother        ? age   Heart attack Father 82   Other Father        DDD    Prior to Admission medications   Medication Sig Start Date End Date Taking? Authorizing Provider  Apremilast (OTEZLA) 30 MG TABS Take 30 mg by mouth 2 (two) times daily.    [provider]  butalbital-acetaminophen-caffeine (FIORICET, ESGIC) 50-325-40 MG tablet Take 1 tablet by mouth every 6 (six) hours as needed for headache.    [provider]  cetirizine (ZYRTEC) 10 MG tablet Take 10 mg by mouth daily.    [provider]  clobetasol cream (TEMOVATE) 0.05 % Apply topically. 09/07/17   [provider]  cyanocobalamin 1000 MCG tablet Take 1,000 mcg by mouth daily.    [provider]  dexlansoprazole (DEXILANT) 60 MG capsule TAKE 1 CAPSULE(60 MG) BY MOUTH DAILY 07/14/22   Midge Minium, MD  Eptinezumab-jjmr (VYEPTI) 100 MG/ML injection Inject 100 mg into the vein every 3 (three) months. Last Saturday    [provider]  fexofenadine (ALLEGRA) 180 MG tablet Take 180 mg by mouth daily.    [provider]  folic acid (FOLVITE) 1 MG tablet Take 1 mg by mouth daily. 08/16/19   [provider]  gabapentin (NEURONTIN) 300 MG capsule Take 900 mg by mouth at bedtime.    [provider]  hydrOXYzine (ATARAX/VISTARIL) 10 MG tablet Take 10 mg by mouth every 8 (eight) hours as needed.    [provider]  hyoscyamine (LEVSIN, ANASPAZ) 0.125 MG tablet Take 0.125 mg by mouth every 4  (four) hours as needed.    [provider]  methocarbamol (ROBAXIN) 500 MG tablet Take 500 mg by mouth at bedtime.  08/22/17   [provider]  methotrexate (RHEUMATREX) 2.5 MG tablet Take 20 mg by mouth once a week. Take 4 tablets (10 mg ) by mouth in the morning and 4 tablets(10 mg by mouth in the evening on Saturdays (total= 20 mg) 02/08/18   [provider]  ondansetron (ZOFRAN-ODT) 4 MG disintegrating tablet Take 1 tablet (4 mg total) by mouth every 8 (eight) hours as needed for nausea or vomiting. 05/06/22   Sharman Cheek, MD  pilocarpine (SALAGEN) 5 MG tablet Take 5 mg by mouth 3 (three) times daily.    [provider]  promethazine (PHENERGAN) 25 MG tablet Take 25 mg by mouth every 6 (six) hours as needed for nausea or vomiting.    [provider]  promethazine-dextromethorphan (PROMETHAZINE-DM) 6.25-15 MG/5ML syrup Take 5 mLs by mouth 4 (four) times daily as needed. 07/08/22   Eusebio Friendly B, PA-C  rizatriptan (MAXALT) 10 MG tablet  Take 10 mg by mouth as needed for migraine. May repeat in 2 hours if needed    [provider]  rOPINIRole (REQUIP) 2 MG tablet Take 2 mg by mouth at bedtime.    [provider]  topiramate (TOPAMAX) 100 MG tablet Take 200 mg by mouth daily.    [provider]  valACYclovir (VALTREX) 1000 MG tablet  07/14/16   [provider]    Physical Exam: Vitals:   11/21/22 1130 11/21/22 1200 11/21/22 1230 11/21/22 1300  BP: 127/60 139/65 130/71 115/80  Pulse: 71 (!) 55 62 (!) 48  Resp: (!) 22 15 13 16   Temp:      TempSrc:      SpO2: 97% 100% 100% 100%   Physical Exam Constitutional:      Appearance: She is normal weight.  HENT:     Head: Normocephalic.     Nose: Nose normal.     Mouth/Throat:     Mouth: Mucous membranes are moist.  Eyes:     Pupils: Pupils are equal, round, and reactive to light.  Cardiovascular:     Rate and Rhythm: Normal rate and regular rhythm.  Pulmonary:      Effort: Pulmonary effort is normal.  Abdominal:     General: Abdomen is flat. Bowel sounds are normal.     Comments: + mild generalized abd pain    Musculoskeletal:        General: Normal range of motion.  Skin:    General: Skin is warm.  Neurological:     General: No focal deficit present.  Psychiatric:        Mood and Affect: Mood normal.     Data Reviewed:  There are no new results to review at this time. CT ABDOMEN PELVIS W CONTRAST CLINICAL DATA:  Rectal bleeding for 5-6 days.  EXAM: CT ABDOMEN AND PELVIS WITH CONTRAST  TECHNIQUE: Multidetector CT imaging of the abdomen and pelvis was performed using the standard protocol following bolus administration of intravenous contrast.  RADIATION DOSE REDUCTION: This exam was performed according to the departmental dose-optimization program which includes automated exposure control, adjustment of the mA and/or kV according to patient size and/or use of iterative reconstruction technique.  CONTRAST:  OMNIPAQUE IOHEXOL 300 MG/ML  SOLN  COMPARISON:  CTA from 5 days ago.  FINDINGS: Lower chest:  No contributory findings.  Hepatobiliary: No focal liver abnormality.Cholecystectomy. Biliary dilatation without obstructing process or interval change, CBD measuring up to 1 cm in diameter.  Pancreas: Unremarkable.  Spleen: Unremarkable.  Adrenals/Urinary Tract: Negative adrenals. No hydronephrosis or stone. Unremarkable bladder.  Stomach/Bowel: Mild distal colonic diverticulosis mainly at the sigmoid. Moderate stool seen in the ascending and transverse colon.  Vascular/Lymphatic: No acute vascular abnormality. No mass or adenopathy.  Reproductive:No pathologic findings.  Other: No ascites or pneumoperitoneum.  Musculoskeletal: No acute abnormalities.  IMPRESSION: 1. No acute or interval finding. 2. Mild distal colonic diverticulosis.  Electronically Signed   By: Tiburcio Pea M.D.   On: 11/21/2022  10:51  Lab Results  Component Value Date   WBC 7.5 11/21/2022   HGB 12.2 11/21/2022   HCT 39.4 11/21/2022   MCV 89.5 11/21/2022   PLT 229 11/21/2022   Last metabolic panel Lab Results  Component Value Date   GLUCOSE 116 (H) 11/21/2022   NA 138 11/21/2022   K 3.5 11/21/2022   CL 108 11/21/2022   CO2 21 (L) 11/21/2022   BUN 15 11/21/2022   CREATININE 0.91 11/21/2022  GFRNONAA >60 11/21/2022   CALCIUM 8.9 11/21/2022   PROT 6.6 11/21/2022   ALBUMIN 3.5 11/21/2022   BILITOT 0.4 11/21/2022   ALKPHOS 55 11/21/2022   AST 15 11/21/2022   ALT 10 11/21/2022   ANIONGAP 9 11/21/2022    Assessment and Plan: * BRBPR (bright red blood per rectum) Recurrent bright red blood per rectum at home with worsening malaise and fatigue Hemoglobin 12.2 today Dr. Norma Fredrickson with gastroenterology is aware of the case with plan for colonoscopy in the morning Will trend hemoglobin overnight Transfuse for hemoglobin less than 7 Follow  Heartburn Continue PPI  Stage 3a chronic kidney disease (HCC) Creatinine 0.91 Appears at baseline Follow  Sjogren's syndrome (HCC) Appears fairly stable Continue Plaquenil  Psoriasis Continue home Otezla  Fibromyalgia Continue Neurontin and Topamax    Greater than 50% was spent in counseling and coordination of care with patient Total encounter time 75  minutes or more in the setting of medical complexity     Advance Care Planning:   Code Status: Full Code   Consults: GI- [place]   Family Communication: Husband at the bedside   Severity of Illness: The appropriate patient status for this patient is INPATIENT. Inpatient status is judged to be reasonable and necessary in order to provide the required intensity of service to ensure the patient's safety. The patient's presenting symptoms, physical exam findings, and initial radiographic and laboratory data in the context of their chronic comorbidities is felt to place them at high risk for further  clinical deterioration. Furthermore, it is not anticipated that the patient will be medically stable for discharge from the hospital within 2 midnights of admission.   * I certify that at the point of admission it is my clinical judgment that the patient will require inpatient hospital care spanning beyond 2 midnights from the point of admission due to high intensity of service, high risk for further deterioration and high frequency of surveillance required.*  Author: Floydene Flock, MD 11/21/2022 1:44 PM  For on call review [email or web address].

## 2022-11-21 NOTE — Assessment & Plan Note (Signed)
Recurrent bright red blood per rectum at home with worsening malaise and fatigue Hemoglobin 12.2 today Dr. Norma Fredrickson with gastroenterology is aware of the case with plan for colonoscopy in the morning Will trend hemoglobin overnight Transfuse for hemoglobin less than 7 Follow

## 2022-11-21 NOTE — Assessment & Plan Note (Signed)
Appears fairly stable Continue Plaquenil

## 2022-11-21 NOTE — H&P (View-Only) (Signed)
  GI Inpatient Consult Note  Reason for Consult: Hematochezia/LGIB   Attending Requesting Consult: Dr. Cameron Isaacs, MD  History of Present Illness: Amy Huynh is a 66 y.o. female seen for evaluation of hematochezia and acute LGIB at the request of ED physician - Dr. Cameron Isaacs. Patient has a PMH of migraines, fibromyalgia, OA, Sjogren's disease, osteopenia, CKD Stage II, psoriasis, GAD, and colonic diverticulosis. She presented to the [facility] ED this person for chief complaint of hematochezia and acute lower GI bleeding. She reports over the past week she has been experiencing 1-2 episodes of hematochezia a day. Last episode was bright red blood with clots this morning before coming to the ED. Upon presentation to the ED, all vital signs were stable. Hemoglobin 12.2 with normal serum electrolytes and normal renal function. CT abd/pelvis was negative for any acute abnormalities. There was mention of distal colonic diverticulosis without evidence for diverticulitis. GI consulted for further evaluation and management.   Patient seen and examined this morning in the ED. Her husband is bedside. She has had no further episodes of hematochezia since being in the ED. She was seen and evaluated at the [facility] ED 7/2 for hematochezia where she had hemoglobin 11.6 and negative CTA. Admission for observation was offered versus outpatient management and she chose observation. She tried to get appointment with Dr. Wohl and got one scheduled for next Tuesday. However, the bleeding got really bad and wouldn't let up this weekend so she decided to come to the ED. She has had hx of intermittent BRBPR over the years which has been attributed to hemorrhoids. She last saw Dr. Wohl in Jan 2024 for GERD and abdominal pain. She had EGD 06/2021 which showed 2 non-bleeding cratered gastric ulcers. Last colonoscopy reportedly 2017 at [facility] with one adenomatous polyp removed and otherwise normal. She reports she did  have a fall of a swing ~2 weeks ago and landed on her left hip. She went to [facility] last weekend and was given a steroid taper (60 mg x 3 days, 40 mg x 3 days, 20 mg x 3 days). She did take 20 mg prednisone taper. She does not take NSAIDs at home. She does endorse some generalized crampy abdominal pain in her LLQ. She denies any fevers, chills, nausea, or vomiting.    Summary of GI Procedures:  EGD 06/26/2021 (Wohl) - irregular Z-line at [facility], small hiatal hernia two non-bleeding cratered gastric ulcer, normal duodenum   CSY 12/30/2015 ([facility]) - normal appearing terminal ileum, normal mucosa in entire examined colon, sigmoid diverticulosis. 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   Melena    Heartburn    Acute peptic ulcer of stomach    Bradycardia 10/16/2020   Stage 3a chronic kidney disease (HCC) 06/24/2020   Abnormal ECG 05/22/2020   History of degenerative disc disease 01/29/2020   LVH (left ventricular hypertrophy) 11/15/2019   Acute respiratory failure with hypoxia (HCC) 09/16/2019   Multilobar lung infiltrate 09/16/2019   Hypomagnesemia 09/16/2019     Muscle weakness (generalized) 09/16/2019   Community acquired pneumonia 09/12/2019   Hypokalemia 09/12/2019   Psoriasis 02/15/2019   Personal history of skin cancer 12/01/2018   Sjogren's syndrome (HCC) 10/11/2017   Family history of ovarian cancer 01/11/2017   Acute bursitis of right shoulder 06/07/2016   Partial tear of right rotator cuff 06/07/2016   Lumbar radiculopathy 07/23/2015    Migraine without aura and without status migrainosus, not intractable 04/23/2015   Chronic daily headache 04/23/2015   DDD (degenerative disc disease), cervical 07/06/2014   Neck pain 07/06/2014   Dyspareunia 12/14/2013   Benign essential hypertension 09/13/2013   Polyarthralgia 07/20/2013   Diverticula of colon 01/22/2013   Fibromyalgia 01/22/2013   Restless legs syndrome 01/22/2013   Moderate obstructive sleep apnea 01/22/2013   Migraine headache 01/22/2013   Insomnia 01/22/2013   Generalized anxiety disorder 01/22/2013   Esophageal reflux 01/22/2013   Atrophic vaginitis 01/22/2013   Anemia 01/22/2013   Allergic rhinitis 01/22/2013   Urticaria 01/18/2013   Low back pain 10/03/2012   Lumbosacral spondylosis 08/17/2012    Past Surgical History: Past Surgical History:  Procedure Laterality Date   BIOPSY N/A 06/26/2021   Procedure: BIOPSY;  Surgeon: Wohl, Darren, MD;  Location: [facility];  Service: Endoscopy;  Laterality: N/A;   CERVICAL FUSION     CESAREAN SECTION     CHOLECYSTECTOMY     ESOPHAGOGASTRODUODENOSCOPY (EGD) WITH PROPOFOL N/A 06/26/2021   Procedure: ESOPHAGOGASTRODUODENOSCOPY (EGD) WITH PROPOFOL;  Surgeon: Wohl, Darren, MD;  Location: [facility];  Service: Endoscopy;  Laterality: N/A;   HERNIA REPAIR     LAPAROTOMY  03/11/2022   Procedure: EXPLORATORY LAPAROTOMY Mesenteric mass biopsy;  Surgeon: Sakai, Isami, DO;  Location: [facility];  Service: General;;   REPLACEMENT TOTAL KNEE Left    SHOULDER SURGERY Right     Allergies: Allergies  Allergen Reactions   Hydrocodone-Acetaminophen     Other reaction(s): Other (See Comments) Severe headache   Hydrocodone-Acetaminophen     Other reaction(s): Headache, Other (See Comments) Severe headache Severe headache  Other reaction(s): Headache Severe headache Other reaction(s): Headache, Other (See Comments) Severe headache Severe headache   Morphine Itching and Other (See Comments)    Other  reaction(s): Other (See Comments) Morphine in high doses causes itching & red face. Morphine in high doses causes itching & red face.  Morphine in high doses causes itching & red face. Other reaction(s): Other (See Comments) Morphine in high doses causes itching & red face. Morphine in high doses causes itching & red face. Morphine in high doses causes itching & red face.   Morphine And Codeine Itching    Morphine in high doses causes itching & red face.    Cymbalta [Duloxetine Hcl] Other (See Comments)    Serotonin Reaction   Doxycycline     Other reaction(s): Abdominal Pain GI intolerance   Duloxetine Other (See Comments)    Other reaction(s): Other (See Comments), Other (See Comments) Serotonin syndrome Serotonin reaction  Serotonin syndrome Other reaction(s): Other (See Comments), Other (See Comments) Serotonin syndrome Serotonin reaction   Pregabalin Other (See Comments)    Other reaction(s): Other (See Comments) Serotinon reaction serotinon reaction  Serotinon reaction Other reaction(s): Other (See Comments) Serotinon reaction serotinon reaction    Home Medications: (Not in a hospital admission)  Home medication reconciliation was completed with the patient.   Family History: family history includes COPD in her mother; Heart attack (age of onset: 57) in her father; Hypertension in her mother; Other in her father;   Ovarian cancer (age of onset: 30) in her maternal aunt; Uterine cancer in her maternal grandmother and mother.  The patient's family history is negative for inflammatory bowel disorders, GI malignancy, or solid organ transplantation.  Social History:   reports that she has never smoked. She has never used smokeless tobacco. She reports that she does not currently use alcohol. She reports that she does not use drugs. The patient denies ETOH, tobacco, or drug use.   Review of Systems: Constitutional: Weight is stable.  Eyes: No changes in vision. ENT: No  oral lesions, sore throat.  GI: see HPI.  Heme/Lymph: No easy bruising.  CV: No chest pain.  GU: No hematuria.  Integumentary: No rashes.  Neuro: No headaches.  Psych: No depression/anxiety.  Endocrine: No heat/cold intolerance.  Allergic/Immunologic: No urticaria.  Resp: No cough, SOB.  Musculoskeletal: No joint swelling.    Physical Examination: BP 139/65   Pulse (!) 55   Temp 98.3 F (36.8 C) (Oral)   Resp 15   SpO2 100%  Gen: NAD, alert and oriented x 4 HEENT: PEERLA, EOMI, Neck: supple, no JVD or thyromegaly Chest: CTA bilaterally, no wheezes, crackles, or other adventitious sounds CV: RRR, no m/g/c/r Abd: soft, NT, ND, +BS in all four quadrants; no HSM, guarding, ridigity, or rebound tenderness Ext: no edema, well perfused with 2+ pulses, Skin: no rash or lesions noted Lymph: no LAD  Data: Lab Results  Component Value Date   WBC 7.5 11/21/2022   HGB 12.2 11/21/2022   HCT 39.4 11/21/2022   MCV 89.5 11/21/2022   PLT 229 11/21/2022   Recent Labs  Lab 11/16/22 0441 11/21/22 0849  HGB 11.6* 12.2   Lab Results  Component Value Date   NA 138 11/21/2022   K 3.5 11/21/2022   CL 108 11/21/2022   CO2 21 (L) 11/21/2022   BUN 15 11/21/2022   CREATININE 0.91 11/21/2022   Lab Results  Component Value Date   ALT 10 11/21/2022   AST 15 11/21/2022   ALKPHOS 55 11/21/2022   BILITOT 0.4 11/21/2022   No results for input(s): "APTT", "INR", "PTT" in the last 168 hours.  CT abd/pelvis with contrast 11/21/2022: IMPRESSION: 1. No acute or interval finding. 2. Mild distal colonic diverticulosis.  CTA abd/pelvis without and with contrast 11/16/2022: IMPRESSION: 1. No acute vascular finding. No evidence of active bleeding or inflammation. 2. Mild distal colonic diverticulosis.  Assessment/Plan:  66 y/o Caucasian female with a PMH of migraines, fibromyalgia, OA, Sjogren's disease, osteopenia, CKD Stage II, psoriasis, GAD, and colonic diverticulosis presented to the  [facility] ED this morning for chief complaint of 1-week history of hematochezia c/w acute LGIB  Hematochezia/LGIB - clinical presentation highly c/w diverticular bleed. Differential also includes anal outlet etiology, colitis, colon polyp, neoplasm, AVMs, etc. Hemoglobin within normal limits at 12.2 with no evidence of active GI bleeding. Contrasted CT Scan without any acute abnormalities this AM.   Sigmoid diverticulosis  Hx of adenomatous polyp of colon  Recommendations:  - Continue to monitor serial H&H. Transfuse for Hgb <7.0.  - Continue supportive care with IV fluids, pain control, and antiemetics per primary team - No signs of active GI bleeding at this time - Avoid NSAIDs - If there are multiple episodes of hematochezia today, recommend repeat CTA abd/pelvis or NM GI Bleeding scan to help localize active bleed - Advise colonoscopy tomorrow with Dr. Toledo - Clear liquid diet today. NPO midnight. Bowel prep orders placed.  - Further recommendations after colonoscopy - Following   along with you  I reviewed the risks (including bleeding, perforation, infection, anesthesia complications, cardiac/respiratory complications), benefits and alternatives of colonoscopy. Patient consents to proceed.   Thank you for the consult. Please call with questions or concerns.  Kelii Chittum M Anneli Bing, PA-C [facility] [phone]  

## 2022-11-21 NOTE — ED Provider Notes (Signed)
[facility] Provider Note    Event Date/Time   First MD Initiated Contact with Patient 11/21/22 0935     (approximate)   History   Rectal Bleeding   HPI  Amy Huynh is a 67 y.o. female here with rectal bleeding.  The patient reportedly has had persistent rectal bleeding for the last week.  She was seen here on 7/2 and wanted to attempt to control symptoms at home.  Since then, she has had 2-3 episodes of grossly bloody bowel movements at home.  She has had associated fatigue.  She called her GI doctor, and was told to come to the ER for evaluation and likely admission.  She is not on anticoagulant.  No known history of GI bleeds.     Physical Exam   Triage Vital Signs: ED Triage Vitals [11/21/22 0852]  Enc Vitals Group     BP 135/87     Pulse Rate 81     Resp 16     Temp 98.3 F (36.8 C)     Temp Source Oral     SpO2 99 %     Weight      Height      Head Circumference      Peak Flow      Pain Score 5     Pain Loc      Pain Edu?      Excl. in GC?     Most recent vital signs: Vitals:   11/21/22 1130 11/21/22 1200  BP: 127/60 139/65  Pulse: 71 (!) 55  Resp: (!) 22 15  Temp:    SpO2: 97% 100%     General: Awake, no distress.  CV:  Good peripheral perfusion.  Regular rate and rhythm. Resp:  Normal work of breathing.  Lungs clear. Abd:  No distention.  Mild left lower quadrant tenderness.  Grossly bloody stool. Other:  Mildly dry mucous membranes.   ED Results / Procedures / Treatments   Labs (all labs ordered are listed, but only abnormal results are displayed) Labs Reviewed  COMPREHENSIVE METABOLIC PANEL - Abnormal; Notable for the following components:      Result Value   CO2 21 (*)    Glucose, Bld 116 (*)    All other components within normal limits  CBC - Abnormal; Notable for the following components:   RDW 18.1 (*)    All other components within normal limits  POC OCCULT BLOOD, ED  TYPE AND SCREEN      EKG    RADIOLOGY CT Abdo/pelvis: Diverticulosis, otherwise unremarkable   I also independently reviewed and agree with radiologist interpretations.   PROCEDURES:  Critical Care performed: No   MEDICATIONS ORDERED IN ED: Medications  polyethylene glycol-electrolytes (NuLYTELY) solution 4,000 mL (has no administration in time range)  sodium chloride 0.9 % bolus 1,000 mL (1,000 mLs Intravenous New Bag/Given 11/21/22 1020)  ondansetron (ZOFRAN) injection 4 mg (4 mg Intravenous Given 11/21/22 1019)  iohexol (OMNIPAQUE) 300 MG/ML solution 100 mL (100 mLs Intravenous Contrast Given 11/21/22 1034)     IMPRESSION / MDM / ASSESSMENT AND PLAN / ED COURSE  I reviewed the triage vital signs and the nursing notes.                              Differential diagnosis includes, but is not limited to, diverticular bleed, diverticulitis, AVM, upper GI bleed, internal hemorrhoids  Patient's presentation is most  consistent with acute presentation with potential threat to life or bodily function.  The patient is on the cardiac monitor to evaluate for evidence of arrhythmia and/or significant heart rate changes   67 year old female here with persistent rectal bleeding.  Hemoglobin is stable.  CMP unremarkable.  CT scan shows diverticulosis without acute abnormality.  I reviewed her records.  She continues to have 2-3 episodes of grossly bloody bowel movements as recently as today and she has gross blood on exam.  Discussed the case with Dr. Norma Fredrickson of GI.  Given patient's symptoms and duration of bleeding, will admit for likely colonoscopy in the morning.  PA will see here in the ED.   FINAL CLINICAL IMPRESSION(S) / ED DIAGNOSES   Final diagnoses:  Lower GI bleed     Rx / DC Orders   ED Discharge Orders     None        Note:  This document was prepared using Dragon voice recognition software and may include unintentional dictation errors.   Shaune Pollack, MD 11/21/22 [phone]

## 2022-11-21 NOTE — Assessment & Plan Note (Addendum)
Continue PPI ?

## 2022-11-21 NOTE — ED Triage Notes (Signed)
Pt to ED for rectal bleeding since 5-6 days ago. Pt seen here this week for same. Was supposed to see GI but they were out of town. States rectal bleeding 1-2 times/day. Blood is bright red. States feels nauseous, took ODT zofran PTA. Also lower abdominal pain, dull and achy with rectal pressure.

## 2022-11-22 ENCOUNTER — Inpatient Hospital Stay: Payer: Medicare PPO | Admitting: Anesthesiology

## 2022-11-22 ENCOUNTER — Encounter
Admission: EM | Disposition: A | Discharge status: Home / Self Care | Payer: Self-pay | Source: Home / Self Care | Attending: Emergency Medicine

## 2022-11-22 ENCOUNTER — Other Ambulatory Visit: Payer: Self-pay

## 2022-11-22 ENCOUNTER — Encounter: Payer: Self-pay | Admitting: Family Medicine

## 2022-11-22 DIAGNOSIS — K5731 Diverticulosis of large intestine without perforation or abscess with bleeding: Secondary | ICD-10-CM

## 2022-11-22 DIAGNOSIS — D62 Acute posthemorrhagic anemia: Secondary | ICD-10-CM | POA: Diagnosis present

## 2022-11-22 HISTORY — PX: COLONOSCOPY: SHX5424

## 2022-11-22 LAB — CBC
HCT: 34.3 % — ABNORMAL LOW (ref 36.0–46.0)
Hemoglobin: 10.7 g/dL — ABNORMAL LOW (ref 12.0–15.0)
MCH: 27.8 pg (ref 26.0–34.0)
MCHC: 31.2 g/dL (ref 30.0–36.0)
MCV: 89.1 fL (ref 80.0–100.0)
Platelets: 197 10*3/uL (ref 150–400)
RBC: 3.85 MIL/uL — ABNORMAL LOW (ref 3.87–5.11)
RDW: 17.8 % — ABNORMAL HIGH (ref 11.5–15.5)
WBC: 7.4 10*3/uL (ref 4.0–10.5)
nRBC: 0 % (ref 0.0–0.2)

## 2022-11-22 LAB — COMPREHENSIVE METABOLIC PANEL
ALT: 9 U/L (ref 0–44)
AST: 14 U/L — ABNORMAL LOW (ref 15–41)
Albumin: 3.4 g/dL — ABNORMAL LOW (ref 3.5–5.0)
Alkaline Phosphatase: 45 U/L (ref 38–126)
Anion gap: 3 — ABNORMAL LOW (ref 5–15)
BUN: 11 mg/dL (ref 8–23)
CO2: 24 mmol/L (ref 22–32)
Calcium: 8.5 mg/dL — ABNORMAL LOW (ref 8.9–10.3)
Chloride: 112 mmol/L — ABNORMAL HIGH (ref 98–111)
Creatinine, Ser: 0.91 mg/dL (ref 0.44–1.00)
GFR, Estimated: >60 mL/min (ref >60)
Glucose, Bld: 94 mg/dL (ref 70–99)
Potassium: 3.3 mmol/L — ABNORMAL LOW (ref 3.5–5.1)
Sodium: 139 mmol/L (ref 135–145)
Total Bilirubin: 0.7 mg/dL (ref 0.3–1.2)
Total Protein: 5.6 g/dL — ABNORMAL LOW (ref 6.5–8.1)

## 2022-11-22 SURGERY — COLONOSCOPY
Anesthesia: General

## 2022-11-22 MED ORDER — SODIUM CHLORIDE 0.9 % IV SOLN: INTRAVENOUS | Status: DC

## 2022-11-22 MED ORDER — DEXMEDETOMIDINE HCL IN NACL 80 MCG/20ML IV SOLN
INTRAVENOUS | Status: AC
  Filled 2022-11-22: qty 20

## 2022-11-22 MED ORDER — PROPOFOL 500 MG/50ML IV EMUL
INTRAVENOUS | Status: DC | PRN
  Administered 2022-11-22: 100 ug/kg/min via INTRAVENOUS

## 2022-11-22 MED ORDER — METHOCARBAMOL 500 MG PO TABS: 500.0000 mg | ORAL_TABLET | Freq: Three times a day (TID) | ORAL | Status: DC | PRN

## 2022-11-22 MED ORDER — PHENYLEPHRINE 80 MCG/ML (10ML) SYRINGE FOR IV PUSH (FOR BLOOD PRESSURE SUPPORT)
PREFILLED_SYRINGE | INTRAVENOUS | Status: DC | PRN
  Administered 2022-11-22: 120 ug via INTRAVENOUS

## 2022-11-22 MED ORDER — PHENYLEPHRINE 80 MCG/ML (10ML) SYRINGE FOR IV PUSH (FOR BLOOD PRESSURE SUPPORT)
PREFILLED_SYRINGE | INTRAVENOUS | Status: AC
  Filled 2022-11-22: qty 10

## 2022-11-22 MED ORDER — VITAMIN B-12 1000 MCG PO TABS
1000.0000 ug | ORAL_TABLET | Freq: Every day | ORAL | Status: DC
  Administered 2022-11-23: 1000 ug via ORAL
  Filled 2022-11-22: qty 1

## 2022-11-22 MED ORDER — PROPOFOL 10 MG/ML IV BOLUS
INTRAVENOUS | Status: AC
  Filled 2022-11-22: qty 20

## 2022-11-22 MED ORDER — POTASSIUM CHLORIDE CRYS ER 20 MEQ PO TBCR
40.0000 meq | EXTENDED_RELEASE_TABLET | Freq: Once | ORAL | Status: AC
  Administered 2022-11-22: 40 meq via ORAL
  Filled 2022-11-22: qty 2

## 2022-11-22 MED ORDER — DEXMEDETOMIDINE HCL IN NACL 80 MCG/20ML IV SOLN
INTRAVENOUS | Status: DC | PRN
  Administered 2022-11-22: 20 ug via INTRAVENOUS

## 2022-11-22 MED ORDER — POTASSIUM CHLORIDE IN NACL 40-0.9 MEQ/L-% IV SOLN
INTRAVENOUS | Status: AC
  Filled 2022-11-22 (×2): qty 1000

## 2022-11-22 MED ORDER — LIDOCAINE HCL (CARDIAC) PF 100 MG/5ML IV SOSY
PREFILLED_SYRINGE | INTRAVENOUS | Status: DC | PRN
  Administered 2022-11-22: 50 mg via INTRAVENOUS

## 2022-11-22 MED ORDER — BUTALBITAL-APAP-CAFFEINE 50-325-40 MG PO TABS
1.0000 | ORAL_TABLET | Freq: Four times a day (QID) | ORAL | Status: DC | PRN
  Administered 2022-11-22: 1 via ORAL
  Filled 2022-11-22: qty 1

## 2022-11-22 MED ORDER — PROPOFOL 10 MG/ML IV BOLUS
INTRAVENOUS | Status: DC | PRN
  Administered 2022-11-22: 70 mg via INTRAVENOUS

## 2022-11-22 MED ORDER — SUMATRIPTAN SUCCINATE 50 MG PO TABS: 50.0000 mg | ORAL_TABLET | ORAL | Status: DC | PRN

## 2022-11-22 NOTE — Discharge Summary (Signed)
Physician Discharge Summary   Patient: Amy Huynh MRN: [id number] DOB: 1955/08/15  Admit date:     11/21/2022  Discharge date: 11/23/22  Discharge Physician: Camika Marsico   PCP: Enid Baas, MD   Recommendations at discharge:   Follow-up with primary care  Discharge Diagnoses: Principal Problem:   Diverticulosis large intestine w/o perforation or abscess w/bleeding Active Problems:   Fibromyalgia   Psoriasis   Sjogren's syndrome (HCC)   Stage 3a chronic kidney disease (HCC)   ABLA (acute blood loss anemia)  Resolved Problems:   * No resolved hospital problems. *  Hospital Course: Amy Huynh is a 67 y.o. female with medical history significant of multiple medical issues including Sjogren's disease, fibromyalgia, psoriasis, neuropathy presenting with rectal bleeding.  Patient reports rectal bleeding for the past 2-3 weeks.  Has had episodes of mild generalized abdominal pain and nausea.  Was initially evaluated on July 2 with noted rectal bleeding.  Admission was initially offered.  However, patient declined.  Per report, patient had establish care with Dr. Servando Snare for GI with plan to get outpatient evaluation.  However, this was around the time of 4 July with no appropriate follow-up.  Since then, patient had 2-3 episodes of gross bloody bowel movements.  Positive malaise.  Patient denies any antiplatelet, NSAID or anticoagulant use.  No prior history of GI bleeding.  Does report remote history of?  Gastric versus small bowel stricture status post prior endoscopy. Presented to the ER afebrile, hemodynamically stable.  Satting well on room air.  White count 7.5, hemoglobin 12.2, platelets 229.  Creatinine 0.9.  CT abdomen pelvis with contrast grossly stable.     Assessment and Plan:  Diverticular bleed Recurrent bright red blood per rectum at home with worsening malaise and fatigue Hemoglobin 12.2 >> 10.7 Status post colonoscopy which showed  nonbleeding internal hemorrhoids.  Diverticulosis in the sigmoid colon. No active bleeding Maintain high-fiber diet Follow-up with gastroenterology as an outpatient   GERD Continue PPI   Sjogren's syndrome (HCC) Appears fairly stable Continue Plaquenil   Psoriasis Continue home Otezla   Fibromyalgia Continue Neurontin and Topamax     Patient was seen and examined at the bedside and is in stable condition for discharge.        Consultants: Gastroenterology Procedures performed: Colonoscopy Disposition: Home Diet recommendation: High-fiber diet Discharge Diet Orders (From admission, onward)     Start     Ordered   11/22/22 0000  Diet - low sodium heart healthy        11/22/22 1503           Regular diet DISCHARGE MEDICATION: Allergies as of 11/23/2022       Reactions   Hydrocodone-acetaminophen    Other reaction(s): Other (See Comments) Severe headache   Hydrocodone-acetaminophen    Other reaction(s): Headache, Other (See Comments) Severe headache Severe headache Other reaction(s): Headache Severe headache Other reaction(s): Headache, Other (See Comments) Severe headache Severe headache   Morphine Itching, Other (See Comments)   Other reaction(s): Other (See Comments) Morphine in high doses causes itching & red face. Morphine in high doses causes itching & red face. Morphine in high doses causes itching & red face. Other reaction(s): Other (See Comments) Morphine in high doses causes itching & red face. Morphine in high doses causes itching & red face. Morphine in high doses causes itching & red face.   Morphine And Codeine Itching   Morphine in high doses causes itching & red face.   Cymbalta [duloxetine  Hcl] Other (See Comments)   Serotonin Reaction   Doxycycline    Other reaction(s): Abdominal Pain GI intolerance   Duloxetine Other (See Comments)   Other reaction(s): Other (See Comments), Other (See Comments) Serotonin syndrome Serotonin  reaction Serotonin syndrome Other reaction(s): Other (See Comments), Other (See Comments) Serotonin syndrome Serotonin reaction   Pregabalin Other (See Comments)   Other reaction(s): Other (See Comments) Serotinon reaction serotinon reaction Serotinon reaction Other reaction(s): Other (See Comments) Serotinon reaction serotinon reaction        Medication List     TAKE these medications    butalbital-acetaminophen-caffeine 50-325-40 MG tablet Commonly known as: FIORICET Take 1 tablet by mouth every 6 (six) hours as needed for headache.   cetirizine 10 MG tablet Commonly known as: ZYRTEC Take 10 mg by mouth daily.   clobetasol cream 0.05 % Commonly known as: TEMOVATE Apply topically.   cyanocobalamin 1000 MCG tablet Take 1,000 mcg by mouth daily.   dexlansoprazole 60 MG capsule Commonly known as: DEXILANT TAKE 1 CAPSULE(60 MG) BY MOUTH DAILY   fexofenadine 180 MG tablet Commonly known as: ALLEGRA Take 180 mg by mouth daily.   folic acid 1 MG tablet Commonly known as: FOLVITE Take 1 mg by mouth daily.   gabapentin 300 MG capsule Commonly known as: NEURONTIN Take 900 mg by mouth at bedtime.   HYDROcodone-acetaminophen 5-325 MG tablet Commonly known as: NORCO/VICODIN Take 1 tablet by mouth every 8 (eight) hours as needed for moderate pain (neurapthy).   hydrOXYzine 10 MG tablet Commonly known as: ATARAX Take 10 mg by mouth every 8 (eight) hours as needed.   hyoscyamine 0.125 MG tablet Commonly known as: LEVSIN Take 0.125 mg by mouth every 4 (four) hours as needed.   methocarbamol 500 MG tablet Commonly known as: ROBAXIN Take 500 mg by mouth every 8 (eight) hours as needed for muscle spasms (RLS).   methotrexate 2.5 MG tablet Commonly known as: RHEUMATREX Take 20 mg by mouth once a week. Take 4 tablets (10 mg ) by mouth in the morning and 4 tablets(10 mg by mouth in the evening on Saturdays (total= 20 mg)   ondansetron 4 MG disintegrating  tablet Commonly known as: ZOFRAN-ODT Take 1 tablet (4 mg total) by mouth every 8 (eight) hours as needed for nausea or vomiting.   Otezla 30 MG Tabs Generic drug: Apremilast Take 30 mg by mouth 2 (two) times daily.   pilocarpine 5 MG tablet Commonly known as: SALAGEN Take 5 mg by mouth 2 (two) times daily.   promethazine 25 MG tablet Commonly known as: PHENERGAN Take 25 mg by mouth every 6 (six) hours as needed for nausea or vomiting.   promethazine-dextromethorphan 6.25-15 MG/5ML syrup Commonly known as: PROMETHAZINE-DM Take 5 mLs by mouth 4 (four) times daily as needed.   rizatriptan 10 MG tablet Commonly known as: MAXALT Take 10 mg by mouth as needed for migraine. May repeat in 2 hours if needed   rOPINIRole 2 MG tablet Commonly known as: REQUIP Take 2 mg by mouth every evening.   topiramate 100 MG tablet Commonly known as: TOPAMAX Take 200 mg by mouth at bedtime.   valACYclovir 1000 MG tablet Commonly known as: VALTREX   Vyepti 100 MG/ML injection Generic drug: Eptinezumab-jjmr Inject 100 mg into the vein every 3 (three) months. Last Saturday        Discharge Exam: There were no vitals filed for this visit. Constitutional:      Appearance: She is normal weight.  HENT:  Head: Normocephalic.     Nose: Nose normal.     Mouth/Throat:     Mouth: Mucous membranes are moist.  Eyes:     Pupils: Pupils are equal, round, and reactive to light.  Cardiovascular:     Rate and Rhythm: Normal rate and regular rhythm.  Pulmonary:     Effort: Pulmonary effort is normal.  Abdominal:     General: Abdomen is flat. Bowel sounds are normal.     Comments: + mild generalized abd pain    Musculoskeletal:        General: Normal range of motion.  Skin:    General: Skin is warm.  Neurological:     General: No focal deficit present.  Psychiatric:        Mood and Affect: Mood normal.   Condition at discharge: stable  The results of significant diagnostics from this  hospitalization (including imaging, microbiology, ancillary and laboratory) are listed below for reference.   Imaging Studies: CT ABDOMEN PELVIS W CONTRAST  Result Date: 11/21/2022 CLINICAL DATA:  Rectal bleeding for 5-6 days. EXAM: CT ABDOMEN AND PELVIS WITH CONTRAST TECHNIQUE: Multidetector CT imaging of the abdomen and pelvis was performed using the standard protocol following bolus administration of intravenous contrast. RADIATION DOSE REDUCTION: This exam was performed according to the departmental dose-optimization program which includes automated exposure control, adjustment of the mA and/or kV according to patient size and/or use of iterative reconstruction technique. CONTRAST:  OMNIPAQUE IOHEXOL 300 MG/ML  SOLN COMPARISON:  CTA from 5 days ago. FINDINGS: Lower chest:  No contributory findings. Hepatobiliary: No focal liver abnormality.Cholecystectomy. Biliary dilatation without obstructing process or interval change, CBD measuring up to 1 cm in diameter. Pancreas: Unremarkable. Spleen: Unremarkable. Adrenals/Urinary Tract: Negative adrenals. No hydronephrosis or stone. Unremarkable bladder. Stomach/Bowel: Mild distal colonic diverticulosis mainly at the sigmoid. Moderate stool seen in the ascending and transverse colon. Vascular/Lymphatic: No acute vascular abnormality. No mass or adenopathy. Reproductive:No pathologic findings. Other: No ascites or pneumoperitoneum. Musculoskeletal: No acute abnormalities. IMPRESSION: 1. No acute or interval finding. 2. Mild distal colonic diverticulosis. Electronically Signed   By: Tiburcio Pea M.D.   On: 11/21/2022 10:51   CT ANGIO GI BLEED  Result Date: 11/16/2022 CLINICAL DATA:  Hematochezia with abdominal pain. EXAM: CTA ABDOMEN AND PELVIS WITHOUT AND WITH CONTRAST TECHNIQUE: Multidetector CT imaging of the abdomen and pelvis was performed using the standard protocol during bolus administration of intravenous contrast. Multiplanar reconstructed images  and MIPs were obtained and reviewed to evaluate the vascular anatomy. RADIATION DOSE REDUCTION: This exam was performed according to the departmental dose-optimization program which includes automated exposure control, adjustment of the mA and/or kV according to patient size and/or use of iterative reconstruction technique. CONTRAST:  OMNIPAQUE IOHEXOL 350 MG/ML SOLN COMPARISON:  05/06/2022 abdominal CT. FINDINGS: VASCULAR Aorta: Unremarkable Celiac: Patent without evidence of aneurysm, dissection, vasculitis or significant stenosis. SMA: Patent without evidence of aneurysm, dissection, vasculitis or significant stenosis. Renals: Unremarkable IMA: Patent Inflow: Patent without evidence of aneurysm, dissection, vasculitis or significant stenosis. Proximal Outflow: Unremarkable Veins: Unremarkable Review of the MIP images confirms the above findings. NON-VASCULAR Lower chest:  No contributory findings. Hepatobiliary: No focal liver abnormality.Cholecystectomy which accounts for distension of the extrahepatic biliary tree. No calcified biliary calculus. Pancreas: Unremarkable. Spleen: Unremarkable. Adrenals/Urinary Tract: Negative adrenals. No hydronephrosis or stone. Unremarkable bladder. Stomach/Bowel:  No obstruction. No appendicitis. Lymphatic: No mass or adenopathy. Reproductive:No pathologic findings. Other: No ascites or pneumoperitoneum. Musculoskeletal: No acute abnormalities. Degeneration with mild scoliosis.  IMPRESSION: 1. No acute vascular finding. No evidence of active bleeding or inflammation. 2. Mild distal colonic diverticulosis. Electronically Signed   By: Tiburcio Pea M.D.   On: 11/16/2022 06:13    Microbiology: Results for orders placed or performed during the hospital encounter of 07/08/22  SARS Coronavirus 2 by RT PCR (hospital order, performed in [facility] hospital lab) *cepheid single result test* Anterior Nasal Swab     Status: None   Collection Time: 07/08/22 10:50 AM    Specimen: Anterior Nasal Swab  Result Value Ref Range Status   SARS Coronavirus 2 by RT PCR NEGATIVE NEGATIVE Final    Comment: (NOTE) SARS-CoV-2 target nucleic acids are NOT DETECTED.  The SARS-CoV-2 RNA is generally detectable in upper and lower respiratory specimens during the acute phase of infection. The lowest concentration of SARS-CoV-2 viral copies this assay can detect is 250 copies / mL. A negative result does not preclude SARS-CoV-2 infection and should not be used as the sole basis for treatment or other patient management decisions.  A negative result may occur with improper specimen collection / handling, submission of specimen other than nasopharyngeal swab, presence of viral mutation(s) within the areas targeted by this assay, and inadequate number of viral copies (<250 copies / mL). A negative result must be combined with clinical observations, patient history, and epidemiological information.  Fact Sheet for Patients:   RoadLapTop.co.za  Fact Sheet for Healthcare Providers: http://kim-miller.com/  This test is not yet approved or  cleared by the [place] FDA and has been authorized for detection and/or diagnosis of SARS-CoV-2 by FDA under an Emergency Use Authorization (EUA).  This EUA will remain in effect (meaning this test can be used) for the duration of the COVID-19 declaration under Section 564(b)(1) of the Act, 21 U.S.C. section 360bbb-3(b)(1), unless the authorization is terminated or revoked sooner.  Performed at [facility], [address] [place], [place] [address]     Labs: CBC: Recent Labs  Lab 11/21/22 [phone] 11/21/22 1625 11/21/22 2128 11/22/22 0334 11/23/22 0610  WBC 7.5  --   --  7.4  --   HGB 12.2 11.6* 11.5* 10.7* 10.2*  HCT 39.4 37.3 37.7 34.3* 33.0*  MCV 89.5  --   --  89.1  --   PLT 229  --   --  197  --    Basic Metabolic Panel: Recent Labs  Lab 11/21/22 0849  11/22/22 0334 11/23/22 0610  NA 138 139 138  K 3.5 3.3* 4.4  CL 108 112* 113*  CO2 21* 24 20*  GLUCOSE 116* 94 126*  BUN 15 11 11   CREATININE 0.91 0.91 0.80  CALCIUM 8.9 8.5* 8.7*   Liver Function Tests: Recent Labs  Lab 11/21/22 0849 11/22/22 0334  AST 15 14*  ALT 10 9  ALKPHOS 55 45  BILITOT 0.4 0.7  PROT 6.6 5.6*  ALBUMIN 3.5 3.4*   CBG: No results for input(s): "GLUCAP" in the last 168 hours.  Discharge time spent: greater than 30 minutes.  Signed: Lucile Shutters, MD [facility] 11/23/2022

## 2022-11-22 NOTE — Care Management CC44 (Addendum)
          Condition Code 44 Documentation Completed  Patient Details  Name: Lateya Raimo MRN: [id number] Date of Birth: 02-Nov-1955   Condition Code 44 given:  Yes Patient signature on Condition Code 44 notice:   Yes Documentation of 2 MD's agreement:   Yes Code 44 added to claim:  Yes   Copy of notice will be mailed to the patient per her request.     Garret Reddish, RN 11/22/2022, 3:56 PM

## 2022-11-22 NOTE — Transfer of Care (Signed)
Immediate Anesthesia Transfer of Care Note  Patient: Amy Huynh  Procedure(s) Performed: COLONOSCOPY  Patient Location: PACU  Anesthesia Type:General  Level of Consciousness: oriented, drowsy, and patient cooperative  Airway & Oxygen Therapy: Patient Spontanous Breathing  Post-op Assessment: Report given to RN and Post -op Vital signs reviewed and stable  Post vital signs: Reviewed and stable  Last Vitals:  Vitals Value Taken Time  BP 86/38 11/22/22 1252  Temp    Pulse 63 11/22/22 1252  Resp 15 11/22/22 1252  SpO2 98 % 11/22/22 1252  Vitals shown include unvalidated device data.  Last Pain:  Vitals:   11/22/22 1201  TempSrc: Temporal  PainSc: 5          Complications: No notable events documented.

## 2022-11-22 NOTE — Care Management Obs Status (Signed)
MEDICARE OBSERVATION STATUS NOTIFICATION   Patient Details  Name: Arcely Nile MRN: [id number] Date of Birth: 14-Dec-1955   Medicare Observation Status Notification Given:  Yes    Garret Reddish, RN 11/22/2022, 3:56 PM

## 2022-11-22 NOTE — Anesthesia Preprocedure Evaluation (Signed)
Anesthesia Evaluation  Patient identified by MRN, date of birth, ID band Patient awake    Reviewed: Allergy & Precautions, H&P , NPO status , Patient's Chart, lab work & pertinent test results, reviewed documented beta blocker date and time   Airway Mallampati: II   Neck ROM: full    Dental  (+) Poor Dentition   Pulmonary neg shortness of breath, sleep apnea , pneumonia, Patient abstained from smoking.   Pulmonary exam normal        Cardiovascular Exercise Tolerance: Poor hypertension, On Medications Normal cardiovascular exam Rhythm:regular Rate:Normal     Neuro/Psych  Headaches  Anxiety      Neuromuscular disease  negative psych ROS   GI/Hepatic Neg liver ROS, PUD,GERD  Medicated,,  Endo/Other  negative endocrine ROS    Renal/GU Renal disease  negative genitourinary   Musculoskeletal   Abdominal   Peds  Hematology  (+) Blood dyscrasia, anemia   Anesthesia Other Findings Past Medical History: No date: Arthritis No date: DDD (degenerative disc disease), cervical No date: DDD (degenerative disc disease), lumbar 12/2016: Family history of ovarian cancer     Comment:  MyRisk neg No date: Fibromyalgia No date: Migraine No date: Neuropathy No date: Placenta previa No date: Sjogren's disease [facility]) Past Surgical History: 06/26/2021: BIOPSY; N/A     Comment:  Procedure: BIOPSY;  Surgeon: Midge Minium, MD;                Location: [facility];  Service: Endoscopy;                Laterality: N/A; No date: CERVICAL FUSION No date: CESAREAN SECTION No date: CHOLECYSTECTOMY 06/26/2021: ESOPHAGOGASTRODUODENOSCOPY (EGD) WITH PROPOFOL; N/A     Comment:  Procedure: ESOPHAGOGASTRODUODENOSCOPY (EGD) WITH               PROPOFOL;  Surgeon: Midge Minium, MD;  Location: [facility];  Service: Endoscopy;  Laterality: N/A; No date: HERNIA REPAIR 03/11/2022: LAPAROTOMY     Comment:  Procedure:  EXPLORATORY LAPAROTOMY Mesenteric mass               biopsy;  Surgeon: Sung Amabile, DO;  Location: [facility];               Service: General;; No date: REPLACEMENT TOTAL KNEE; Left No date: SHOULDER SURGERY; Right   Reproductive/Obstetrics negative OB ROS                             Anesthesia Physical Anesthesia Plan  ASA: 3 and emergent  Anesthesia Plan: General   Post-op Pain Management:    Induction:   PONV Risk Score and Plan:   Airway Management Planned:   Additional Equipment:   Intra-op Plan:   Post-operative Plan:   Informed Consent: I have reviewed the patients History and Physical, chart, labs and discussed the procedure including the risks, benefits and alternatives for the proposed anesthesia with the patient or authorized representative who has indicated his/her understanding and acceptance.     Dental Advisory Given  Plan Discussed with: CRNA  Anesthesia Plan Comments:        Anesthesia Quick Evaluation

## 2022-11-22 NOTE — Interval H&P Note (Signed)
History and Physical Interval Note:  11/22/2022 12:28 PM  Amy Huynh  has presented today for surgery, with the diagnosis of Hematochezia/LGIB.  The various methods of treatment have been discussed with the patient and family. After consideration of risks, benefits and other options for treatment, the patient has consented to  Procedure(s): COLONOSCOPY (N/A) as a surgical intervention.  The patient's history has been reviewed, patient examined, no change in status, stable for surgery.  I have reviewed the patient's chart and labs.  Questions were answered to the patient's satisfaction.     [place], [place]

## 2022-11-22 NOTE — Op Note (Signed)
[facility] Gastroenterology Patient Name: Amy Huynh Procedure Date: 11/22/2022 11:49 AM MRN: [id number] Account #: [id number] Date of Birth: [birth year] Admit Type: Inpatient Age: 67 Room: [facility] ENDO ROOM 1 Gender: Female Note Status: Finalized Instrument Name: Nelda Marseille [id number] Procedure:             Colonoscopy Indications:           Hematochezia Providers:             Boykin Nearing. Norma Fredrickson MD, MD Referring MD:          Enid Baas, MD (Referring MD) Medicines:             Propofol per Anesthesia Complications:         No immediate complications. Procedure:             Pre-Anesthesia Assessment:                        - The risks and benefits of the procedure and the                         sedation options and risks were discussed with the                         patient. All questions were answered and informed                         consent was obtained.                        - Patient identification and proposed procedure were                         verified prior to the procedure by the nurse. The                         procedure was verified in the procedure room.                        - ASA Grade Assessment: III - A patient with severe                         systemic disease.                        - After reviewing the risks and benefits, the patient                         was deemed in satisfactory condition to undergo the                         procedure.                        After obtaining informed consent, the colonoscope was                         passed under direct vision. Throughout the procedure,                         the patient's blood pressure, pulse,  and oxygen                         saturations were monitored continuously. The                         Colonoscope was introduced through the anus and                         advanced to the the cecum, identified by appendiceal                         orifice and  ileocecal valve. The colonoscopy was                         performed without difficulty. The patient tolerated                         the procedure well. The quality of the bowel                         preparation was good. The ileocecal valve, appendiceal                         orifice, and rectum were photographed. Findings:      The perianal and digital rectal examinations were normal. Pertinent       negatives include normal sphincter tone and no palpable rectal lesions.      Non-bleeding internal hemorrhoids were found during retroflexion. The       hemorrhoids were Grade I (internal hemorrhoids that do not prolapse).      A few small-mouthed diverticula were found in the sigmoid colon.      There is no endoscopic evidence of bleeding, inflammation, mass or       polyps in the entire colon.      The exam was otherwise without abnormality. Impression:            - Non-bleeding internal hemorrhoids.                        - Diverticulosis in the sigmoid colon.                        - The examination was otherwise normal.                        - No specimens collected. Recommendation:        - High fiber diet.                        - Continue present medications.                        - Return patient to hospital ward for possible                         discharge same day.                        - The findings and recommendations were discussed with  the patient.                        - Return to Dr. Servando Snare in 3 months.                        - The findings and recommendations were discussed with                         the patient. Procedure Code(s):     --- Professional ---                        [phone], Colonoscopy, flexible; diagnostic, including                         collection of specimen(s) by brushing or washing, when                         performed (separate procedure) Diagnosis Code(s):     --- Professional ---                         K57.30, Diverticulosis of large intestine without                         perforation or abscess without bleeding                        K92.1, Melena (includes Hematochezia)                        K64.0, First degree hemorrhoids CPT copyright 2022 American Medical Association. All rights reserved. The codes documented in this report are preliminary and upon coder review may  be revised to meet current compliance requirements. Stanton Kidney MD, MD 11/22/2022 12:50:33 PM This report has been signed electronically. Number of Addenda: 0 Note Initiated On: 11/22/2022 11:49 AM Scope Withdrawal Time: 0 hours 5 minutes 56 seconds  Total Procedure Duration: 0 hours 9 minutes 9 seconds  Estimated Blood Loss:  Estimated blood loss: none.      [facility]

## 2022-11-23 ENCOUNTER — Ambulatory Visit: Payer: Medicare PPO | Admitting: Gastroenterology

## 2022-11-23 ENCOUNTER — Encounter: Payer: Self-pay | Admitting: Internal Medicine

## 2022-11-23 VITALS — BP 129/82 | HR 80 | Temp 98.1°F | Wt 166.0 lb

## 2022-11-23 DIAGNOSIS — K625 Hemorrhage of anus and rectum: Secondary | ICD-10-CM | POA: Diagnosis not present

## 2022-11-23 DIAGNOSIS — K5731 Diverticulosis of large intestine without perforation or abscess with bleeding: Secondary | ICD-10-CM | POA: Diagnosis not present

## 2022-11-23 LAB — BASIC METABOLIC PANEL
Anion gap: 5 (ref 5–15)
BUN: 11 mg/dL (ref 8–23)
CO2: 20 mmol/L — ABNORMAL LOW (ref 22–32)
Calcium: 8.7 mg/dL — ABNORMAL LOW (ref 8.9–10.3)
Chloride: 113 mmol/L — ABNORMAL HIGH (ref 98–111)
Creatinine, Ser: 0.8 mg/dL (ref 0.44–1.00)
GFR, Estimated: >60 mL/min (ref >60)
Glucose, Bld: 126 mg/dL — ABNORMAL HIGH (ref 70–99)
Potassium: 4.4 mmol/L (ref 3.5–5.1)
Sodium: 138 mmol/L (ref 135–145)

## 2022-11-23 LAB — HEMOGLOBIN AND HEMATOCRIT, BLOOD
HCT: 33 % — ABNORMAL LOW (ref 36.0–46.0)
Hemoglobin: 10.2 g/dL — ABNORMAL LOW (ref 12.0–15.0)

## 2022-11-23 MED ORDER — HYDROCORTISONE ACETATE 25 MG RE SUPP: 25.0000 mg | Freq: Every day | RECTAL | 0 refills | Status: DC

## 2022-11-23 MED ORDER — HYDROCORTISONE (PERIANAL) 2.5 % EX CREA: 1.0000 | TOPICAL_CREAM | Freq: Every day | CUTANEOUS | 0 refills | Status: DC

## 2022-11-23 NOTE — Progress Notes (Addendum)
Progress Note   Patient: Amy Huynh ZOX:[id number] DOB: 07-Mar-1956 DOA: 11/21/2022     1 DOS: the patient was seen and examined on 11/23/2022   Brief hospital course:  Amy Huynh is a 67 y.o. female with medical history significant of multiple medical issues including Sjogren's disease, fibromyalgia, psoriasis, neuropathy presenting with rectal bleeding.  Patient reports rectal bleeding for the past 2-3 weeks.  Has had episodes of mild generalized abdominal pain and nausea.  Was initially evaluated on July 2 with noted rectal bleeding.  Admission was initially offered.  However, patient declined.  Per report, patient had establish care with Dr. Servando Snare for GI with plan to get outpatient evaluation.  However, this was around the time of 4 July with no appropriate follow-up.  Since then, patient had 2-3 episodes of gross bloody bowel movements.  Positive malaise.  Patient denies any antiplatelet, NSAID or anticoagulant use.  No prior history of GI bleeding.  Does report remote history of?  Gastric versus small bowel stricture status post prior endoscopy. Presented to the ER afebrile, hemodynamically stable.  Satting well on room air.  White count 7.5, hemoglobin 12.2, platelets 229.  Creatinine 0.9.  CT abdomen pelvis with contrast grossly stable.    Assessment and Plan:  * BRBPR (bright red blood per rectum) Recurrent bright red blood per rectum at home with worsening malaise and fatigue Hemoglobin 12.2 on admission, down to 10.7 Patient is status post colonoscopy which showed nonbleeding internal hemorrhoids and diverticulosis in the sigmoid colon. Discussed with gastroenterology who states that patient could be discharged home Discussed discharge plans with patient who did not feel comfortable about going home and preferred to stay overnight to monitor her H&H.     GERD Continue PPI   Stage 3a chronic kidney disease (HCC) Creatinine 0.91 Appears at baseline Follow    Sjogren's syndrome (HCC) Appears fairly stable Continue Plaquenil   Psoriasis Continue home Otezla   Fibromyalgia Continue Neurontin and Topamax       Subjective: Seen and examined at the bedside.  Complaining of pain in her left upper quadrant.  CT scan of the abdomen and pelvis reviewed and is negative for any acute findings.  Physical Exam: Vitals:   11/22/22 1500 11/22/22 1948 11/23/22 0432 11/23/22 0831  BP: 100/64 105/64 115/61 (!) 127/59  Pulse: 69 78 71 69  Resp:  17  18  Temp: 97.8 F (36.6 C) 98.3 F (36.8 C) 98.2 F (36.8 C) 98.6 F (37 C)  TempSrc: Tympanic Oral Oral Oral  SpO2: 98% 98% 100% 100%   Constitutional:      Appearance: She is normal weight.  HENT:     Head: Normocephalic.     Nose: Nose normal.     Mouth/Throat:     Mouth: Mucous membranes are moist.  Eyes:     Pupils: Pupils are equal, round, and reactive to light.  Cardiovascular:     Rate and Rhythm: Normal rate and regular rhythm.  Pulmonary:     Effort: Pulmonary effort is normal.  Abdominal:     General: Abdomen is flat. Bowel sounds are normal.  Left upper quadrant pain    Comments:    Musculoskeletal:        General: Normal range of motion.  Skin:    General: Skin is warm.  Neurological:     General: No focal deficit present.  Psychiatric:        Mood and Affect: Mood normal.  Data Reviewed: Potassium 3.3, hemoglobin 12.2 >> 10.7 There are no new results to review at this time.  Family Communication: Plan of care discussed with patient  Disposition: Status is: Observation The patient remains OBS appropriate and will d/c before 2 midnights.  Planned Discharge Destination: Home    Time spent: 33 minutes  Author: Lucile Shutters, MD 11/23/2022 10:45 AM  For on call review [email or web address].

## 2022-11-23 NOTE — Progress Notes (Signed)
Patient is alert and oriented X 4. Discharge instruction given.

## 2022-11-23 NOTE — Progress Notes (Signed)
Primary Care Physician: Enid Baas, MD  Primary Gastroenterologist:  Dr. Midge Minium  Chief Complaint  Patient presents with   Hospitalization Follow-up    HPI: Amy Huynh is a 67 y.o. female here for rectal bleeding.  The patient had called our office for rectal bleeding and was given appointment for today.  The patient had more rectal bleeding and went to the hospital 2 days ago and was admitted to the hospital for the rectal bleeding.  The patient underwent a colonoscopy yesterday by Dr. Norma Fredrickson that showed internal hemorrhoids that were nonbleeding and diverticulosis.  The patient had a CT angiography that showed:  IMPRESSION: 1. No acute vascular finding. No evidence of active bleeding or inflammation. 2. Mild distal colonic diverticulosis.  The patient was discharged from the hospital today and already had the appointment with me today and went to our other office to be seen.  The patient was told that I was not there and that I was at the [place] office for which the patient then proceeded to come to the [place] office after being discharged from the hospital a few hours ago. The patient reports that she is feels very drained and tired.  She has not had any further bleeding although she states that after the colonoscopy she had a little bright red blood on the toilet paper.  The patient does have a history of irritable bowel syndrome.  The patient comes with her husband and her son to the office today.  The patient's son works at the hospital in the radiology department.  Past Medical History:  Diagnosis Date   Arthritis    DDD (degenerative disc disease), cervical    DDD (degenerative disc disease), lumbar    Family history of ovarian cancer 12/2016   MyRisk neg   Fibromyalgia    Migraine    Neuropathy    Placenta previa    Sjogren's disease (HCC)     Current Outpatient Medications  Medication Sig Dispense Refill   Apremilast (OTEZLA) 30 MG  TABS Take 30 mg by mouth 2 (two) times daily.     butalbital-acetaminophen-caffeine (FIORICET, ESGIC) 50-325-40 MG tablet Take 1 tablet by mouth every 6 (six) hours as needed for headache.     cetirizine (ZYRTEC) 10 MG tablet Take 10 mg by mouth daily.     clobetasol cream (TEMOVATE) 0.05 % Apply topically.     cyanocobalamin 1000 MCG tablet Take 1,000 mcg by mouth daily.     dexlansoprazole (DEXILANT) 60 MG capsule TAKE 1 CAPSULE(60 MG) BY MOUTH DAILY 90 capsule 2   Eptinezumab-jjmr (VYEPTI) 100 MG/ML injection Inject 100 mg into the vein every 3 (three) months. Last Saturday     fexofenadine (ALLEGRA) 180 MG tablet Take 180 mg by mouth daily.     folic acid (FOLVITE) 1 MG tablet Take 1 mg by mouth daily.     gabapentin (NEURONTIN) 300 MG capsule Take 900 mg by mouth at bedtime.     HYDROcodone-acetaminophen (NORCO/VICODIN) 5-325 MG tablet Take 1 tablet by mouth every 8 (eight) hours as needed for moderate pain (neurapthy).     hydrOXYzine (ATARAX/VISTARIL) 10 MG tablet Take 10 mg by mouth every 8 (eight) hours as needed.     hyoscyamine (LEVSIN, ANASPAZ) 0.125 MG tablet Take 0.125 mg by mouth every 4 (four) hours as needed.     methocarbamol (ROBAXIN) 500 MG tablet Take 500 mg by mouth every 8 (eight) hours as needed for muscle spasms (RLS).  methotrexate (RHEUMATREX) 2.5 MG tablet Take 20 mg by mouth once a week. Take 4 tablets (10 mg ) by mouth in the morning and 4 tablets(10 mg by mouth in the evening on Saturdays (total= 20 mg)     ondansetron (ZOFRAN-ODT) 4 MG disintegrating tablet Take 1 tablet (4 mg total) by mouth every 8 (eight) hours as needed for nausea or vomiting. 20 tablet 0   pilocarpine (SALAGEN) 5 MG tablet Take 5 mg by mouth 2 (two) times daily.     promethazine (PHENERGAN) 25 MG tablet Take 25 mg by mouth every 6 (six) hours as needed for nausea or vomiting.     promethazine-dextromethorphan (PROMETHAZINE-DM) 6.25-15 MG/5ML syrup Take 5 mLs by mouth 4 (four) times daily as  needed. 118 mL 0   rizatriptan (MAXALT) 10 MG tablet Take 10 mg by mouth as needed for migraine. May repeat in 2 hours if needed     rOPINIRole (REQUIP) 2 MG tablet Take 2 mg by mouth every evening.     topiramate (TOPAMAX) 100 MG tablet Take 200 mg by mouth at bedtime.     valACYclovir (VALTREX) 1000 MG tablet      No current facility-administered medications for this visit.    Allergies as of 11/23/2022 - Review Complete 11/22/2022  Allergen Reaction Noted   Hydrocodone-acetaminophen  09/24/2015   Hydrocodone-acetaminophen  09/24/2015   Morphine Itching and Other (See Comments) 10/03/2012   Morphine and codeine Itching 10/03/2012   Cymbalta [duloxetine hcl] Other (See Comments) 12/29/2016   Doxycycline  02/17/2022   Duloxetine Other (See Comments) 07/23/2015   Pregabalin Other (See Comments) 09/24/2015    ROS:  General: Negative for anorexia, weight loss, fever, chills, fatigue, weakness. ENT: Negative for hoarseness, difficulty swallowing , nasal congestion. CV: Negative for chest pain, angina, palpitations, dyspnea on exertion, peripheral edema.  Respiratory: Negative for dyspnea at rest, dyspnea on exertion, cough, sputum, wheezing.  GI: See history of present illness. GU:  Negative for dysuria, hematuria, urinary incontinence, urinary frequency, nocturnal urination.  Endo: Negative for unusual weight change.    Physical Examination:   BP 129/82 (BP Location: Left Arm, Patient Position: Sitting, Cuff Size: Large)   Pulse 80   Temp 98.1 F (36.7 C) (Oral)   Wt 166 lb (75.3 kg)   BMI 30.36 kg/m   General: Well-nourished, well-developed in no acute distress.  Eyes: No icterus. Conjunctivae pink. Neuro: Alert and oriented x 3.  Grossly intact. Psych: Alert and cooperative, normal mood and affect.  Labs:    Imaging Studies: CT ABDOMEN PELVIS W CONTRAST  Result Date: 11/21/2022 CLINICAL DATA:  Rectal bleeding for 5-6 days. EXAM: CT ABDOMEN AND PELVIS WITH CONTRAST  TECHNIQUE: Multidetector CT imaging of the abdomen and pelvis was performed using the standard protocol following bolus administration of intravenous contrast. RADIATION DOSE REDUCTION: This exam was performed according to the departmental dose-optimization program which includes automated exposure control, adjustment of the mA and/or kV according to patient size and/or use of iterative reconstruction technique. CONTRAST:  OMNIPAQUE IOHEXOL 300 MG/ML  SOLN COMPARISON:  CTA from 5 days ago. FINDINGS: Lower chest:  No contributory findings. Hepatobiliary: No focal liver abnormality.Cholecystectomy. Biliary dilatation without obstructing process or interval change, CBD measuring up to 1 cm in diameter. Pancreas: Unremarkable. Spleen: Unremarkable. Adrenals/Urinary Tract: Negative adrenals. No hydronephrosis or stone. Unremarkable bladder. Stomach/Bowel: Mild distal colonic diverticulosis mainly at the sigmoid. Moderate stool seen in the ascending and transverse colon. Vascular/Lymphatic: No acute vascular abnormality. No mass or adenopathy.  Reproductive:No pathologic findings. Other: No ascites or pneumoperitoneum. Musculoskeletal: No acute abnormalities. IMPRESSION: 1. No acute or interval finding. 2. Mild distal colonic diverticulosis. Electronically Signed   By: Tiburcio Pea M.D.   On: 11/21/2022 10:51   CT ANGIO GI BLEED  Result Date: 11/16/2022 CLINICAL DATA:  Hematochezia with abdominal pain. EXAM: CTA ABDOMEN AND PELVIS WITHOUT AND WITH CONTRAST TECHNIQUE: Multidetector CT imaging of the abdomen and pelvis was performed using the standard protocol during bolus administration of intravenous contrast. Multiplanar reconstructed images and MIPs were obtained and reviewed to evaluate the vascular anatomy. RADIATION DOSE REDUCTION: This exam was performed according to the departmental dose-optimization program which includes automated exposure control, adjustment of the mA and/or kV according to patient  size and/or use of iterative reconstruction technique. CONTRAST:  OMNIPAQUE IOHEXOL 350 MG/ML SOLN COMPARISON:  05/06/2022 abdominal CT. FINDINGS: VASCULAR Aorta: Unremarkable Celiac: Patent without evidence of aneurysm, dissection, vasculitis or significant stenosis. SMA: Patent without evidence of aneurysm, dissection, vasculitis or significant stenosis. Renals: Unremarkable IMA: Patent Inflow: Patent without evidence of aneurysm, dissection, vasculitis or significant stenosis. Proximal Outflow: Unremarkable Veins: Unremarkable Review of the MIP images confirms the above findings. NON-VASCULAR Lower chest:  No contributory findings. Hepatobiliary: No focal liver abnormality.Cholecystectomy which accounts for distension of the extrahepatic biliary tree. No calcified biliary calculus. Pancreas: Unremarkable. Spleen: Unremarkable. Adrenals/Urinary Tract: Negative adrenals. No hydronephrosis or stone. Unremarkable bladder. Stomach/Bowel:  No obstruction. No appendicitis. Lymphatic: No mass or adenopathy. Reproductive:No pathologic findings. Other: No ascites or pneumoperitoneum. Musculoskeletal: No acute abnormalities. Degeneration with mild scoliosis. IMPRESSION: 1. No acute vascular finding. No evidence of active bleeding or inflammation. 2. Mild distal colonic diverticulosis. Electronically Signed   By: Tiburcio Pea M.D.   On: 11/16/2022 06:13    Assessment and Plan:   Amy Huynh is a 67 y.o. y/o female who comes in today after having a colonoscopy yesterday and being discharged from the hospital earlier today.  The patient had a colonoscopy showing hemorrhoids with it reported as grade 1 with pictures suggesting larger hemorrhoids and a previous colonoscopy showing moderate-sized hemorrhoids.  The patient also had diverticulosis.  Due to the patient's continued bleeding and admission to the hospital due to the bleeding the patient will be set up for hemorrhoidal banding and if this does  not stop the bleeding then a another source such as a diverticulosis should be considered as the cause.  Patient will be given hydrocortisone suppositories for 1 week until she can get set up for the banding.  The patient has been explained the plan and agrees with it.     Midge Minium, MD. Clementeen Graham    Note: This dictation was prepared with Dragon dictation along with smaller phrase technology. Any transcriptional errors that result from this process are unintentional.

## 2022-11-25 NOTE — Anesthesia Postprocedure Evaluation (Signed)
Anesthesia Post Note  Patient: Personnel officer  Procedure(s) Performed: COLONOSCOPY  Patient location during evaluation: PACU Anesthesia Type: General Level of consciousness: awake and alert Pain management: pain level controlled Vital Signs Assessment: post-procedure vital signs reviewed and stable Respiratory status: spontaneous breathing, nonlabored ventilation, respiratory function stable and patient connected to nasal cannula oxygen Cardiovascular status: blood pressure returned to baseline and stable Postop Assessment: no apparent nausea or vomiting Anesthetic complications: no   No notable events documented.   Last Vitals:  Vitals:   11/23/22 0432 11/23/22 0831  BP: 115/61 (!) 127/59  Pulse: 71 69  Resp:  18  Temp: 36.8 C 37 C  SpO2: 100% 100%    Last Pain:  Vitals:   11/23/22 0831  TempSrc: Oral  PainSc: 0-No pain                 Yevette Edwards

## 2022-11-30 ENCOUNTER — Ambulatory Visit
Admission: EM | Admit: 2022-11-30 | Discharge: 2022-11-30 | Disposition: A | Discharge status: Home / Self Care | Payer: Medicare PPO

## 2022-11-30 DIAGNOSIS — R202 Paresthesia of skin: Secondary | ICD-10-CM | POA: Diagnosis not present

## 2022-11-30 DIAGNOSIS — M542 Cervicalgia: Secondary | ICD-10-CM

## 2022-11-30 DIAGNOSIS — R252 Cramp and spasm: Secondary | ICD-10-CM

## 2022-11-30 DIAGNOSIS — G8929 Other chronic pain: Secondary | ICD-10-CM

## 2022-11-30 NOTE — ED Triage Notes (Signed)
Pt c/o L hand numbness x1 day. States unable to close or open hand. Denies any injuries.

## 2022-11-30 NOTE — Discharge Instructions (Signed)
-  We discussed possible causes of your condition.  Based on your history and presentation today it seems most consistent with pinched nerve in your neck.  I am concerned about the weakness in your hand.  You have an appointment with a spine specialist tomorrow.  Please keep this appointment and mention this to them.  He likely need a stat MRI.  If your arm goes numb or you have increasing weakness before that you should go to the ER. - Continue your home pain medication and muscle relaxers.

## 2022-11-30 NOTE — ED Provider Notes (Signed)
[facility]    CSN: [id number] Arrival date & time: 11/30/22  1107      History   Chief Complaint Chief Complaint  Patient presents with   Numbness    HPI Amy Huynh is a 67 y.o. female with history of chronic neck and back pain due to degenerative disc disease, fibromyalgia, migraines, Sjogren's, stage III CKD, recent GI bleed, hypertension, sleep apnea, and anxiety.  Patient follows up with a spine specialist for her back and neck.  She has an appointment tomorrow.  She takes oxycodone and gabapentin as prescribed by specialist.  Today she presents for concerns about cramping discomfort in the left hand as well as numbness and tingling in the hands.  Also reports weakness of her grip.  This has been ongoing today.  She does report chronic neck pain as mentioned above.  Reports increased pain with movement of neck.  Also reports tenderness of her upper arm and forearm on the left side.  Has taken gabapentin and oxycodone and does not think it has really helped her symptoms.  She was recently in the ED for a GI bleed 9 days ago.  She has some bruising on her left arm but had not had any discomfort until today.  She is concerned she might have tendinitis but denies any injury whatsoever.  No trauma to her arm or hand.  No associated swelling, erythema, wounds, rashes.  No other concerns.  HPI  Past Medical History:  Diagnosis Date   Arthritis    DDD (degenerative disc disease), cervical    DDD (degenerative disc disease), lumbar    Family history of ovarian cancer 12/2016   MyRisk neg   Fibromyalgia    Migraine    Neuropathy    Placenta previa    Sjogren's disease (HCC)     Patient Active Problem List   Diagnosis Date Noted   ABLA (acute blood loss anemia) 11/22/2022   Diverticulosis large intestine w/o perforation or abscess w/bleeding 11/22/2022   BRBPR (bright red blood per rectum) 11/21/2022   Volvulus of intestine (HCC) 03/11/2022   Melena     Heartburn    Acute peptic ulcer of stomach    Bradycardia 10/16/2020   Stage 3a chronic kidney disease (HCC) 06/24/2020   Abnormal ECG 05/22/2020   History of degenerative disc disease 01/29/2020   LVH (left ventricular hypertrophy) 11/15/2019   Acute respiratory failure with hypoxia (HCC) 09/16/2019   Multilobar lung infiltrate 09/16/2019   Hypomagnesemia 09/16/2019   Muscle weakness (generalized) 09/16/2019   Community acquired pneumonia 09/12/2019   Hypokalemia 09/12/2019   Psoriasis 02/15/2019   Personal history of skin cancer 12/01/2018   Sjogren's syndrome (HCC) 10/11/2017   Family history of ovarian cancer 01/11/2017   Acute bursitis of right shoulder 06/07/2016   Partial tear of right rotator cuff 06/07/2016   Lumbar radiculopathy 07/23/2015   Migraine without aura and without status migrainosus, not intractable 04/23/2015   Chronic daily headache 04/23/2015   DDD (degenerative disc disease), cervical 07/06/2014   Neck pain 07/06/2014   Dyspareunia 12/14/2013   Benign essential hypertension 09/13/2013   Polyarthralgia 07/20/2013   Diverticula of colon 01/22/2013   Fibromyalgia 01/22/2013   Restless legs syndrome 01/22/2013   Moderate obstructive sleep apnea 01/22/2013   Migraine headache 01/22/2013   Insomnia 01/22/2013   Generalized anxiety disorder 01/22/2013   Esophageal reflux 01/22/2013   Atrophic vaginitis 01/22/2013   Anemia 01/22/2013   Allergic rhinitis 01/22/2013   Urticaria 01/18/2013  Low back pain 10/03/2012   Lumbosacral spondylosis 08/17/2012    Past Surgical History:  Procedure Laterality Date   BIOPSY N/A 06/26/2021   Procedure: BIOPSY;  Surgeon: Midge Minium, MD;  Location: [facility];  Service: Endoscopy;  Laterality: N/A;   CERVICAL FUSION     CESAREAN SECTION     CHOLECYSTECTOMY     COLONOSCOPY N/A 11/22/2022   Procedure: COLONOSCOPY;  Surgeon: Toledo, Boykin Nearing, MD;  Location: [facility];  Service: Gastroenterology;   Laterality: N/A;   ESOPHAGOGASTRODUODENOSCOPY (EGD) WITH PROPOFOL N/A 06/26/2021   Procedure: ESOPHAGOGASTRODUODENOSCOPY (EGD) WITH PROPOFOL;  Surgeon: Midge Minium, MD;  Location: [facility];  Service: Endoscopy;  Laterality: N/A;   HERNIA REPAIR     LAPAROTOMY  03/11/2022   Procedure: EXPLORATORY LAPAROTOMY Mesenteric mass biopsy;  Surgeon: Sung Amabile, DO;  Location: [facility];  Service: General;;   REPLACEMENT TOTAL KNEE Left    SHOULDER SURGERY Right     OB History     Gravida  4   Para  3   Term  3   Preterm      AB  1   Living  2      SAB  1   IAB      Ectopic      Multiple      Live Births  3            Home Medications    Prior to Admission medications   Medication Sig Start Date End Date Taking? Authorizing Provider  Apremilast (OTEZLA) 30 MG TABS Take 30 mg by mouth 2 (two) times daily.   Yes [provider]  butalbital-acetaminophen-caffeine (FIORICET, ESGIC) 50-325-40 MG tablet Take 1 tablet by mouth every 6 (six) hours as needed for headache.   Yes [provider]  cetirizine (ZYRTEC) 10 MG tablet Take 10 mg by mouth daily.   Yes [provider]  clobetasol cream (TEMOVATE) 0.05 % Apply topically. 09/07/17  Yes [provider]  cyanocobalamin 1000 MCG tablet Take 1,000 mcg by mouth daily.   Yes [provider]  dexlansoprazole (DEXILANT) 60 MG capsule TAKE 1 CAPSULE(60 MG) BY MOUTH DAILY 07/14/22  Yes Midge Minium, MD  Eptinezumab-jjmr (VYEPTI) 100 MG/ML injection Inject 100 mg into the vein every 3 (three) months. Last Saturday   Yes [provider]  fexofenadine (ALLEGRA) 180 MG tablet Take 180 mg by mouth daily.   Yes [provider]  folic acid (FOLVITE) 1 MG tablet Take 1 mg by mouth daily. 08/16/19  Yes [provider]  gabapentin (NEURONTIN) 300 MG capsule Take 900 mg by mouth at bedtime.   Yes [provider]  HYDROcodone-acetaminophen (NORCO/VICODIN)  5-325 MG tablet Take 1 tablet by mouth every 8 (eight) hours as needed for moderate pain (neurapthy).   Yes [provider]  hydrocortisone (ANUSOL-HC) 2.5 % rectal cream Place 1 Application rectally at bedtime. 11/23/22  Yes Midge Minium, MD  hydrocortisone (ANUSOL-HC) 25 MG suppository Place 1 suppository (25 mg total) rectally at bedtime. 11/23/22  Yes Midge Minium, MD  hydrOXYzine (ATARAX/VISTARIL) 10 MG tablet Take 10 mg by mouth every 8 (eight) hours as needed.   Yes [provider]  hyoscyamine (LEVSIN, ANASPAZ) 0.125 MG tablet Take 0.125 mg by mouth every 4 (four) hours as needed.   Yes [provider]  methocarbamol (ROBAXIN) 500 MG tablet Take 500 mg by mouth every 8 (eight) hours as needed for muscle spasms (RLS). 08/22/17  Yes [provider]  methotrexate (RHEUMATREX) 2.5 MG tablet Take 20 mg by mouth once a week. Take 4 tablets (10 mg ) by mouth in the morning and 4 tablets(10 mg by mouth in the evening on Saturdays (total= 20 mg) 02/08/18  Yes [provider]  ondansetron (ZOFRAN-ODT) 4 MG disintegrating tablet Take 1 tablet (4 mg total) by mouth every 8 (eight) hours as needed for nausea or vomiting. 05/06/22  Yes Sharman Cheek, MD  pilocarpine (SALAGEN) 5 MG tablet Take 5 mg by mouth 2 (two) times daily.   Yes [provider]  promethazine (PHENERGAN) 25 MG tablet Take 25 mg by mouth every 6 (six) hours as needed for nausea or vomiting.   Yes [provider]  rizatriptan (MAXALT) 10 MG tablet Take 10 mg by mouth as needed for migraine. May repeat in 2 hours if needed   Yes [provider]  rOPINIRole (REQUIP) 2 MG tablet Take 2 mg by mouth every evening.   Yes [provider]  topiramate (TOPAMAX) 100 MG tablet Take 200 mg by mouth at bedtime.   Yes [provider]  valACYclovir (VALTREX) 1000 MG tablet  07/14/16  Yes [provider]    Family History Family History  Problem Relation Age  of Onset   Ovarian cancer Maternal Aunt 30   Uterine cancer Mother        63s   COPD Mother    Hypertension Mother    Uterine cancer Maternal Grandmother        ? age   Heart attack Father 76   Other Father        DDD    Social History Social History   Tobacco Use   Smoking status: Never   Smokeless tobacco: Never  Vaping Use   Vaping status: Never Used  Substance Use Topics   Alcohol use: Not Currently   Drug use: Never     Allergies   Hydrocodone-acetaminophen, Hydrocodone-acetaminophen, Morphine, Morphine and codeine, Cymbalta [duloxetine hcl], Doxycycline, Duloxetine, and Pregabalin   Review of Systems Review of Systems  Respiratory:  Negative for shortness of breath.   Cardiovascular:  Negative for chest pain and palpitations.  Musculoskeletal:  Positive for arthralgias, back pain, neck pain and neck stiffness. Negative for gait problem and joint swelling.  Skin:  Positive for color change (brusing of arm). Negative for pallor, rash and wound.  Neurological:  Positive for weakness and numbness. Negative for dizziness and headaches.     Physical Exam Triage Vital Signs ED Triage Vitals  Encounter Vitals Group     BP 11/30/22 1146 121/84     Systolic BP Percentile --      Diastolic BP Percentile --      Pulse Rate 11/30/22 1146 74     Resp 11/30/22 1146 16     Temp 11/30/22 1146 98.6 F (37 C)     Temp Source 11/30/22 1146 Oral     SpO2 11/30/22 1146 97 %     Weight 11/30/22 1145 166 lb (75.3 kg)     Height 11/30/22 1145 5' 2.5" (1.588 m)     Head Circumference --      Peak Flow --      Pain Score 11/30/22 1148 5     Pain Loc --      Pain Education --      Exclude from Growth Chart --    No data found.  Updated Vital Signs BP 121/84 (BP Location: Right Arm)   Pulse 74  Temp 98.6 F (37 C) (Oral)   Resp 16   Ht 5' 2.5" (1.588 m)   Wt 166 lb (75.3 kg)   SpO2 97%   BMI 29.88 kg/m      Physical Exam Vitals and nursing note reviewed.   Constitutional:      General: She is not in acute distress.    Appearance: Normal appearance. She is not ill-appearing or toxic-appearing.  HENT:     Head: Normocephalic and atraumatic.     Nose: Nose normal.     Mouth/Throat:     Mouth: Mucous membranes are moist.     Pharynx: Oropharynx is clear.  Eyes:     General: No scleral icterus.       Right eye: No discharge.        Left eye: No discharge.     Conjunctiva/sclera: Conjunctivae normal.  Cardiovascular:     Rate and Rhythm: Normal rate and regular rhythm.     Heart sounds: Normal heart sounds.  Pulmonary:     Effort: Pulmonary effort is normal. No respiratory distress.     Breath sounds: Normal breath sounds.  Musculoskeletal:     Left shoulder: Tenderness (lateral upper arm) present. Normal range of motion.     Left forearm: Tenderness (mid forearm) present. No swelling, deformity or bony tenderness.     Left wrist: Tenderness (dorsal and ventral wrist) present. No swelling or deformity. Normal range of motion.     Left hand: No tenderness. Normal range of motion. Decreased strength (4/5 grip compared to right. Normal strength of the rest of the extremity). Normal pulse.     Cervical back: Neck supple. Tenderness (left paracervical muscles and trap) present. No bony tenderness. Pain with movement present. Decreased range of motion.  Skin:    General: Skin is dry.     Findings: Bruising (few contusions of left am) present.  Neurological:     General: No focal deficit present.     Mental Status: She is alert. Mental status is at baseline.     Motor: No weakness.     Gait: Gait normal.  Psychiatric:        Mood and Affect: Mood normal.        Behavior: Behavior normal.        Thought Content: Thought content normal.      UC Treatments / Results  Labs (all labs ordered are listed, but only abnormal results are displayed) Labs Reviewed - No data to display  EKG   Radiology No results  found.  Procedures Procedures (including critical care time)  Medications Ordered in UC Medications - No data to display  Initial Impression / Assessment and Plan / UC Course  I have reviewed the triage vital signs and the nursing notes.  Pertinent labs & imaging results that were available during my care of the patient were reviewed by me and considered in my medical decision making (see chart for details).   67 year old female with history of chronic neck and back pain, fibromyalgia, hypertension, Sjogren's presents for cramp of the left hand with numbness/tingling, pain of upper arm and forearm on the left side and neck pain.  Discomfort in hand started today.  No injury.  Has taken oxycodone and gabapentin.  Clinical presentation today is most consistent with cervical radiculopathy.  I do have concerns since she has 4-5 grip strength on the left side compared to the right.  The remainder of the extremity strength is normal compared to the  right side.  No evidence of injury so concern at this time is highest for pinched nerve.  Advised patient she needs a stat MRI of her neck since she has weakness in the hand.  She has an appointment with the spine specialist tomorrow and states she will bring it up to them.  We discussed continuing gabapentin and oxycodone.  I offered her corticosteroid injection in the clinic but she declined stating she does not want to take something unless she knows exactly what is going on.  She thinks she might need an x-ray but I explained to her that there was no injury and she does not have any bony tenderness of her hand so it is really not indicated.  I also explained that we do not have access to ultrasound or CT at this time to evaluate further and she really needs an MRI of her neck as that is likely the cause of her discomfort.  Reviewed ED precautions relating to cervical radiculopathy to patient.  Also advised going to ED if she were to have chest pain,  palpitations, shortness of breath, increased weakness in the left arm.  Acute exacerbation of chronic underlying condition.  Final Clinical Impressions(s) / UC Diagnoses   Final diagnoses:  Paresthesia  Hand cramp  Chronic neck pain     Discharge Instructions      -We discussed possible causes of your condition.  Based on your history and presentation today it seems most consistent with pinched nerve in your neck.  I am concerned about the weakness in your hand.  You have an appointment with a spine specialist tomorrow.  Please keep this appointment and mention this to them.  He likely need a stat MRI.  If your arm goes numb or you have increasing weakness before that you should go to the ER. - Continue your home pain medication and muscle relaxers.    ED Prescriptions   None    I have reviewed the PDMP during this encounter.   Shirlee Latch, PA-C 11/30/22 1237

## 2022-12-01 ENCOUNTER — Ambulatory Visit
Admission: RE | Admit: 2022-12-01 | Discharge: 2022-12-01 | Disposition: A | Discharge status: Home / Self Care | Payer: Medicare PPO | Source: Ambulatory Visit | Attending: Family Medicine | Admitting: Family Medicine

## 2022-12-01 ENCOUNTER — Other Ambulatory Visit: Payer: Self-pay

## 2022-12-01 ENCOUNTER — Other Ambulatory Visit: Payer: Self-pay | Admitting: Family Medicine

## 2022-12-01 DIAGNOSIS — M5412 Radiculopathy, cervical region: Secondary | ICD-10-CM | POA: Insufficient documentation

## 2022-12-01 MED ORDER — DIAZEPAM 5 MG PO TABS
ORAL_TABLET | ORAL | 0 refills | Status: DC
  Filled 2022-12-01: qty 2, 1d supply, fill #0

## 2022-12-02 ENCOUNTER — Encounter: Payer: Self-pay | Admitting: Family Medicine

## 2022-12-02 DIAGNOSIS — M5412 Radiculopathy, cervical region: Secondary | ICD-10-CM

## 2022-12-02 DIAGNOSIS — G959 Disease of spinal cord, unspecified: Secondary | ICD-10-CM

## 2022-12-02 DIAGNOSIS — M5030 Other cervical disc degeneration, unspecified cervical region: Secondary | ICD-10-CM

## 2022-12-03 ENCOUNTER — Other Ambulatory Visit: Payer: Self-pay | Admitting: Neurology

## 2022-12-03 DIAGNOSIS — G35 Multiple sclerosis: Secondary | ICD-10-CM

## 2022-12-10 ENCOUNTER — Ambulatory Visit
Admission: RE | Admit: 2022-12-10 | Discharge: 2022-12-10 | Disposition: A | Discharge status: Home / Self Care | Payer: Medicare PPO | Source: Ambulatory Visit | Attending: Neurology | Admitting: Neurology

## 2022-12-10 DIAGNOSIS — G35 Multiple sclerosis: Secondary | ICD-10-CM | POA: Diagnosis present

## 2022-12-10 MED ORDER — GADOBUTROL 1 MMOL/ML IV SOLN
7.0000 mL | Freq: Once | INTRAVENOUS | Status: AC | PRN
  Administered 2022-12-10: 7 mL via INTRAVENOUS

## 2022-12-14 ENCOUNTER — Ambulatory Visit: Payer: Medicare PPO | Admitting: Gastroenterology

## 2022-12-14 ENCOUNTER — Encounter: Payer: Self-pay | Admitting: Gastroenterology

## 2022-12-14 ENCOUNTER — Other Ambulatory Visit: Payer: Self-pay

## 2022-12-14 VITALS — BP 135/81 | HR 66 | Temp 98.0°F | Ht 62.5 in | Wt 164.2 lb

## 2022-12-14 DIAGNOSIS — K648 Other hemorrhoids: Secondary | ICD-10-CM | POA: Diagnosis not present

## 2022-12-14 NOTE — Progress Notes (Signed)
Patient follow-ups today for banding of hemorrhoids    Summary of history :  Patient of Dr Servando Snare , h/o rectal bleeding , found hemorrhoids on recent colonoscopy  Here for banding , failed conservative management   Digital rectal exam performed in the presence of a chaperone. External anal findings: none Internal findings , No masses, no blood on glove noticed.    PROCEDURE NOTE: The patient presents with symptomatic grade 1 hemorrhoids, unresponsive to maximal medical therapy, requesting rubber band ligation of his/her hemorrhoidal disease.  All risks, benefits and alternative forms of therapy were described and informed consent was obtained.  In the Left Lateral Decubitus position (if anoscopy is performed) anoscopic examination revealed grade 1 hemorrhoids in the all position(s).   The decision was made to band the RA internal hemorrhoid, and the [facility]'Regan System was used to perform band ligation without complication.  Digital anorectal examination was then performed to assure proper positioning of the band, and to adjust the banded tissue as required.  The patient was discharged home without pain or other issues.  Dietary and behavioral recommendations were given and (if necessary - prescriptions were given), along with follow-up instructions.  The patient will return 4 weeks for follow-up and possible additional banding as required.  No complications were encountered and the patient tolerated the procedure well.   Plan:  Avoid constipation.    Follow-up:4 weeks  Dr Wyline Mood MD,MRCP [facility]) Gastroenterology/Hepatology Pager: [phone]

## 2022-12-27 ENCOUNTER — Other Ambulatory Visit: Payer: Self-pay | Admitting: Internal Medicine

## 2022-12-27 ENCOUNTER — Ambulatory Visit
Admission: RE | Admit: 2022-12-27 | Discharge: 2022-12-27 | Disposition: A | Discharge status: Home / Self Care | Payer: Medicare PPO | Source: Ambulatory Visit | Attending: Internal Medicine | Admitting: Internal Medicine

## 2022-12-27 DIAGNOSIS — M7989 Other specified soft tissue disorders: Secondary | ICD-10-CM | POA: Insufficient documentation

## 2023-01-11 ENCOUNTER — Encounter: Payer: Self-pay | Admitting: Gastroenterology

## 2023-01-11 ENCOUNTER — Ambulatory Visit: Payer: Medicare PPO | Admitting: Gastroenterology

## 2023-01-11 NOTE — Progress Notes (Deleted)
Patient follow-ups today for banding of hemorrhoids    Summary of history :     Patient of Dr Servando Snare , h/o rectal bleeding , found hemorrhoids on recent colonoscopy  Here for banding , failed conservative management    First round:12/14/2022: RA column banded     Interval history  12/14/2022-01/11/2023    Digital rectal exam performed in the presence of a chaperone. External anal findings: *** Internal findings:*** , No masses, no blood on glove noticed.    PROCEDURE NOTE: The patient presents with symptomatic grade {1-4:31454} hemorrhoids, unresponsive to maximal medical therapy, requesting rubber band ligation of his/her hemorrhoidal disease.  All risks, benefits and alternative forms of therapy were described and informed consent was obtained.  In the Left Lateral Decubitus position (if anoscopy is performed) anoscopic examination revealed grade {1-4:31454} hemorrhoids in the {CHL AMB HEMORRHOID POSITION:210130901} position(s).   The decision was made to band the {CHL AMB HEMORRHOID POSITION:210130901} internal hemorrhoid, and the [facility] O'Regan System was used to perform band ligation without complication.  Digital anorectal examination was then performed to assure proper positioning of the band, and to adjust the banded tissue as required.  The patient was discharged home without pain or other issues.  Dietary and behavioral recommendations were given and (if necessary - prescriptions were given), along with follow-up instructions.  The patient will return {1-4:31454} {CHL AMB WEEKS/MONTHS/AS NEEDED:210130900} for follow-up and possible additional banding as required.  No complications were encountered and the patient tolerated the procedure well.   Plan:  Avoid constipation.  Commence on stool softeners if not already on  Follow-up:***  Dr Wyline Mood MD,MRCP [facility]) Gastroenterology/Hepatology Pager: [phone]  BP check ***

## 2023-01-27 ENCOUNTER — Ambulatory Visit: Payer: Medicare PPO | Admitting: Gastroenterology

## 2023-02-09 ENCOUNTER — Encounter: Payer: Self-pay | Admitting: Gastroenterology

## 2023-04-04 ENCOUNTER — Other Ambulatory Visit: Payer: Self-pay | Admitting: Internal Medicine

## 2023-04-04 DIAGNOSIS — Z1231 Encounter for screening mammogram for malignant neoplasm of breast: Secondary | ICD-10-CM

## 2023-04-18 ENCOUNTER — Other Ambulatory Visit: Payer: Self-pay | Admitting: Family Medicine

## 2023-04-18 DIAGNOSIS — M5416 Radiculopathy, lumbar region: Secondary | ICD-10-CM

## 2023-04-25 ENCOUNTER — Other Ambulatory Visit: Payer: Self-pay

## 2023-04-25 MED ORDER — DEXLANSOPRAZOLE 60 MG PO CPDR: 60.0000 mg | DELAYED_RELEASE_CAPSULE | Freq: Every day | ORAL | 0 refills | Status: DC

## 2023-05-04 ENCOUNTER — Ambulatory Visit
Admission: RE | Admit: 2023-05-04 | Discharge: 2023-05-04 | Disposition: A | Discharge status: Home / Self Care | Payer: Medicare PPO | Source: Ambulatory Visit | Attending: Family Medicine | Admitting: Family Medicine

## 2023-05-04 DIAGNOSIS — M5416 Radiculopathy, lumbar region: Secondary | ICD-10-CM

## 2023-07-12 ENCOUNTER — Other Ambulatory Visit: Payer: Self-pay | Admitting: Gastroenterology

## 2023-09-15 ENCOUNTER — Other Ambulatory Visit: Payer: Self-pay | Admitting: Gastroenterology

## 2023-09-20 ENCOUNTER — Encounter: Payer: Self-pay | Admitting: Gastroenterology

## 2023-09-20 ENCOUNTER — Telehealth: Payer: Self-pay

## 2023-09-20 DIAGNOSIS — K625 Hemorrhage of anus and rectum: Secondary | ICD-10-CM

## 2023-09-20 DIAGNOSIS — K648 Other hemorrhoids: Secondary | ICD-10-CM

## 2023-09-20 NOTE — Telephone Encounter (Signed)
 Please inform patient that I have reviewed her MyChart message and images she shared Recommend to check CBC Dr. Antony Baumgartner did her first banding in 2024 before she was on anticoagulation.  We have to obtain clearance for interruption of Eliquis 2 to 3 days before and 2 to 3 days after hemorrhoid banding in order to proceed with banding procedure Recommend trial of hydrocortisone  suppository once or twice daily for 1 week in the meantime  I also noticed that she has mild vitamin B12 deficiency, not sure if she is taking B12 once daily  RV

## 2023-09-20 NOTE — Telephone Encounter (Signed)
 Patient called stating that she was Dr. Curtis Dow patient and then saw Dr. Antony Baumgartner since she had hemorrhoids and was treated. 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 Patient stated that today in the morning she went to the bathroom and had a bowel movement and noticed that the toilet had dark red blood and it scared her. She wanted to know if she was able to stop her Eliquis and have hemorrhoid banding. I told her that I would have to ask a provider if that's even a possibility because of the history of mini strokes. Can you please advise me Dr. Baldomero Bone, thank you.

## 2023-09-21 ENCOUNTER — Other Ambulatory Visit: Payer: Self-pay

## 2023-09-21 MED ORDER — HYDROCORTISONE ACETATE 25 MG RE SUPP: 25.0000 mg | Freq: Every day | RECTAL | 0 refills | Status: DC

## 2023-09-21 MED ORDER — HYDROCORTISONE (PERIANAL) 2.5 % EX CREA: 1.0000 | TOPICAL_CREAM | Freq: Two times a day (BID) | CUTANEOUS | 0 refills | Status: AC

## 2023-09-21 NOTE — Telephone Encounter (Signed)
 Dr. Baldomero Bone, the patient replied back via MyChart stating that not only that the suppositories were expensive but that they didn't help her at all. Patient also stated that she will go and have her CBC checked today for you to review. She also stated that she already knew that her Vitamin B12 was low and that her doctor started her with Vitamin B12 shots. Patient would like to know what else she could do for her rectal bleeding that continues. Please advise.

## 2023-09-21 NOTE — Telephone Encounter (Signed)
 I replied patient back via MyChart.

## 2023-09-22 LAB — CBC WITH DIFFERENTIAL/PLATELET
Basophils Absolute: 0 10*3/uL (ref 0.0–0.2)
Basos: 1 %
EOS (ABSOLUTE): 0.1 10*3/uL (ref 0.0–0.4)
Eos: 2 %
Hematocrit: 40.2 % (ref 34.0–46.6)
Hemoglobin: 13.1 g/dL (ref 11.1–15.9)
Immature Grans (Abs): 0 10*3/uL (ref 0.0–0.1)
Immature Granulocytes: 1 %
Lymphocytes Absolute: 1.1 10*3/uL (ref 0.7–3.1)
Lymphs: 26 %
MCH: 30 pg (ref 26.6–33.0)
MCHC: 32.6 g/dL (ref 31.5–35.7)
MCV: 92 fL (ref 79–97)
Monocytes Absolute: 0.2 10*3/uL (ref 0.1–0.9)
Monocytes: 4 %
Neutrophils Absolute: 2.8 10*3/uL (ref 1.4–7.0)
Neutrophils: 66 %
Platelets: 167 10*3/uL (ref 150–450)
RBC: 4.37 x10E6/uL (ref 3.77–5.28)
RDW: 16.3 % — ABNORMAL HIGH (ref 11.7–15.4)
WBC: 4.2 10*3/uL (ref 3.4–10.8)

## 2023-10-10 ENCOUNTER — Other Ambulatory Visit: Payer: Self-pay | Admitting: Gastroenterology

## 2023-11-22 ENCOUNTER — Other Ambulatory Visit: Payer: Self-pay | Admitting: Interventional Radiology

## 2023-11-22 DIAGNOSIS — K649 Unspecified hemorrhoids: Secondary | ICD-10-CM

## 2023-11-23 NOTE — Consult Note (Signed)
 Chief Complaint: Patient was seen in consultation today for symptomatic hemorrhoids   Referring Physician(s): Karalee Wilkie POUR  Supervising Physician: Karalee Wilkie  Patient Status: [facility] - Outpatient  History of Present Illness: Amy Huynh is a 68 y.o. female with a medical history significant for anxiety/depression, PTSD, fibromyalgia, IBS, diverticulosis, interstitial lung disease, traumatic head injury, HTN, migraines, Sjogren's syndrome, chronic kidney disease, stroke and DVT. She has had numerous ED visits/hospitalizations in the past year related to recurrent strokes and there is concern for CNS vasculitis or Behcet's disease. Her last CVA was April 2025. She has a history of elevated von Willebrand factor levels and has been on rituximab infusions in addition to steroids, methotrexate , prasterone and apremilast .    She has been on various treatment regimens including aspirin , Plavix, Eliquis and lovenox . Since starting Eliquis in early 2025 she began to experience intermittent rectal bleeding and epistaxis. The bleeding can last up to 48 hours. She previously had banding of her hemorrhoids July 2024 (prior to her being on anticoagulation) and a repeat banding would require interruption of her anticoagulation regimen for up to 6 days total.   She is being followed by Hematology and the patient is now taking Dabigatran and Aspirin . Her hematology team has proposed a therapeutic intervention to address the GI bleeding and the patient has been referred to Interventional Radiology to discuss possible embolization.  Past Medical History:  Diagnosis Date   Arthritis    DDD (degenerative disc disease), cervical    DDD (degenerative disc disease), lumbar    Family history of ovarian cancer 12/2016   MyRisk neg   Fibromyalgia    Migraine    Neuropathy    Placenta previa    Sjogren's disease (HCC)     Past Surgical History:  Procedure Laterality Date    BIOPSY N/A 06/26/2021   Procedure: BIOPSY;  Surgeon: Jinny Carmine, MD;  Location: [facility];  Service: Endoscopy;  Laterality: N/A;   CERVICAL FUSION     CESAREAN SECTION     CHOLECYSTECTOMY     COLONOSCOPY N/A 11/22/2022   Procedure: COLONOSCOPY;  Surgeon: Toledo, Ladell POUR, MD;  Location: [facility] ENDOSCOPY;  Service: Gastroenterology;  Laterality: N/A;   ESOPHAGOGASTRODUODENOSCOPY (EGD) WITH PROPOFOL  N/A 06/26/2021   Procedure: ESOPHAGOGASTRODUODENOSCOPY (EGD) WITH PROPOFOL ;  Surgeon: Jinny Carmine, MD;  Location: [facility];  Service: Endoscopy;  Laterality: N/A;   HERNIA REPAIR     LAPAROTOMY  03/11/2022   Procedure: EXPLORATORY LAPAROTOMY Mesenteric mass biopsy;  Surgeon: Tye Millet, DO;  Location: [facility] ORS;  Service: General;;   REPLACEMENT TOTAL KNEE Left    SHOULDER SURGERY Right     Allergies: Hydrocodone-acetaminophen , Hydrocodone-acetaminophen , Morphine, Morphine and codeine, Cymbalta [duloxetine hcl], Doxycycline, Duloxetine, and Pregabalin  Medications: Prior to Admission medications   Medication Sig Start Date End Date Taking? Authorizing Provider  Apremilast  (OTEZLA ) 30 MG TABS Take 30 mg by mouth 2 (two) times daily.    [provider]  azithromycin  (ZITHROMAX ) 250 MG tablet Take 250 mg by mouth daily. 08/05/22   [provider]  butalbital -acetaminophen -caffeine  (FIORICET , ESGIC ) 50-325-40 MG tablet Take 1 tablet by mouth every 6 (six) hours as needed for headache.    [provider]  cetirizine (ZYRTEC) 10 MG tablet Take 10 mg by mouth daily.    [provider]  clobetasol  cream (TEMOVATE ) 0.05 % Apply topically. 09/07/17   [provider]  cyanocobalamin  1000 MCG tablet Take 1,000 mcg by mouth daily.    [provider]  dexlansoprazole  (DEXILANT ) 60 MG capsule TAKE 1 CAPSULE(60 MG) BY MOUTH DAILY 09/15/23   Jinny Carmine, MD  DHEA 50 MG TABS Take 1 tablet by mouth in the morning. 11/09/19   [provider]  Eptinezumab-jjmr (VYEPTI) 100 MG/ML injection Inject 100 mg into the vein every 3 (three) months. Last Saturday    [provider]  fexofenadine (ALLEGRA) 180 MG tablet Take 180 mg by mouth daily.    [provider]  folic acid  (FOLVITE ) 1 MG tablet Take 1 mg by mouth daily. 08/16/19   [provider]  furosemide (LASIX) 20 MG tablet Take 20 mg by mouth daily. 05/24/22 05/24/23  [provider]  gabapentin  (NEURONTIN ) 300 MG capsule Take 900 mg by mouth at bedtime.    [provider]  HYDROcodone-acetaminophen  (NORCO/VICODIN) 5-325 MG tablet Take 1 tablet by mouth every 8 (eight) hours as needed for moderate pain (neurapthy).    [provider]  hydrocortisone  (ANUSOL -HC) 2.5 % rectal cream Place 1 Application rectally 2 (two) times daily. Apply pea-sized cream per rectum twice daily for 7 to 10 days. 09/21/23   Unk Corinn Skiff, MD  hydrOXYzine  (ATARAX /VISTARIL ) 10 MG tablet Take 10 mg by mouth every 8 (eight) hours as needed.    [provider]  hyoscyamine  (LEVSIN , ANASPAZ ) 0.125 MG tablet Take 0.125 mg by mouth every 4 (four) hours as needed.    [provider]  methocarbamol  (ROBAXIN ) 500 MG tablet Take 500 mg by mouth every 8 (eight) hours as needed for muscle spasms (RLS). 08/22/17   [provider]  methotrexate  (RHEUMATREX) 2.5 MG tablet Take 20 mg by mouth once a week. Take 4 tablets (10 mg ) by mouth in the morning and 4 tablets(10 mg by mouth in the evening on Saturdays (total= 20 mg) 02/08/18   [provider]  ondansetron  (ZOFRAN -ODT) 4 MG disintegrating tablet Take 1 tablet (4 mg total) by mouth every 8 (eight) hours as needed for nausea or vomiting. 05/06/22   Viviann Pastor, MD  oxyCODONE -acetaminophen  (PERCOCET/ROXICET) 5-325 MG tablet Take 1 tablet by mouth at bedtime as needed.    [provider]  pilocarpine  (SALAGEN ) 5 MG tablet Take 5 mg by mouth 2 (two) times daily.     [provider]  promethazine  (PHENERGAN ) 25 MG tablet Take 25 mg by mouth every 6 (six) hours as needed for nausea or vomiting.    [provider]  rizatriptan (MAXALT) 10 MG tablet Take 10 mg by mouth as needed for migraine. May repeat in 2 hours if needed    [provider]  rOPINIRole  (REQUIP ) 2 MG tablet Take 2 mg by mouth every evening.    [provider]  topiramate  (TOPAMAX ) 100 MG tablet Take 200 mg by mouth at bedtime.    [provider]  valACYclovir (VALTREX) 1000 MG tablet  07/14/16   [provider]     Family History  Problem Relation Age of Onset   Ovarian cancer Maternal Aunt 30   Uterine cancer Mother        42s   COPD Mother    Hypertension Mother    Uterine cancer Maternal Grandmother        ? age   Heart attack Father 19   Other Father        DDD    Social History   Socioeconomic History   Marital status: Married    Spouse name: Not on file   Number of children:  Not on file   Years of education: Not on file   Highest education level: Not on file  Occupational History   Not on file  Tobacco Use   Smoking status: Never   Smokeless tobacco: Never  Vaping Use   Vaping status: Never Used  Substance and Sexual Activity   Alcohol use: Not Currently   Drug use: Never   Sexual activity: Not Currently    Birth control/protection: Post-menopausal  Other Topics Concern   Not on file  Social History Narrative   Not on file   Social Drivers of Health   Financial Resource Strain: Low Risk  (09/07/2023)   Received from [facility]   Overall Financial Resource Strain (CARDIA)    Difficulty of Paying Living Expenses: Not hard at all  Recent Concern: Financial Resource Strain - Medium Risk (08/29/2023)   Received from [facility]   Overall Financial Resource Strain (CARDIA)    Difficulty of Paying Living Expenses: Somewhat hard  Food Insecurity: No Food Insecurity  (09/07/2023)   Received from [facility]   Hunger Vital Sign    Within the past 12 months, you worried that your food would run out before you got the money to buy more.: Never true    Within the past 12 months, the food you bought just didn't last and you didn't have money to get more.: Never true  Recent Concern: Food Insecurity - Food Insecurity Present (08/29/2023)   Received from [facility]   Hunger Vital Sign    Worried About Running Out of Food in the Last Year: Sometimes true    Ran Out of Food in the Last Year: Sometimes true  Transportation Needs: No Transportation Needs (09/07/2023)   Received from [facility]   PRAPARE - Transportation    In the past 12 months, has lack of transportation kept you from medical appointments or from getting medications?: No    Lack of Transportation (Non-Medical): No  Physical Activity: Unknown (11/18/2020)   Received from [facility]   Exercise Vital Sign    On average, how many days per week do you engage in moderate to strenuous exercise (like a brisk walk)?: 0 days    Minutes of Exercise per Session: Not on file  Recent Concern: Physical Activity - Inactive (11/18/2020)   Received from [facility]   Exercise Vital Sign    Days of Exercise per Week: 0 days    Minutes of Exercise per Session: 0 min  Stress: No Stress Concern Present (11/18/2020)   Received from [facility] - Occupational Stress Questionnaire    Feeling of Stress : Only a little  Social Connections: Not on file    Review of Systems: A 12 point ROS discussed and pertinent positives are indicated in the HPI above.  All other systems are negative.  Review of Systems  Vital Signs: There were no vitals taken for this visit.  Physical Exam  Imaging: No results found.  Labs:  CBC: Recent Labs    09/21/23 1145  WBC 4.2  HGB  13.1  HCT 40.2  PLT 167    COAGS: No results for input(s): INR, APTT in the last 8760 hours.  BMP: No results for input(s): NA, K, CL, CO2, GLUCOSE, BUN, CALCIUM, CREATININE, GFRNONAA, GFRAA in the last 8760 hours.  Invalid input(s): CMP  LIVER FUNCTION TESTS: No results  for input(s): BILITOT, AST, ALT, ALKPHOS, PROT, ALBUMIN in the last 8760 hours.  TUMOR MARKERS: No results for input(s): AFPTM, CEA, CA199, CHROMGRNA in the last 8760 hours.  Assessment and Plan:  67 year old female with a history of recurrent CVA on anticoagulation with intermittent rectal bleeding secondary to hemorrhoids.   Thank you for this interesting consult.  I greatly enjoyed meeting [employer] and look forward to participating in their care.  A copy of this report was sent to the requesting provider on this date.  Electronically Signed: Warren Dais, AGACNP-BC 11/23/2023, 10:01 AM   I spent a total of  40 Minutes   in face to face in clinical consultation, greater than 50% of which was counseling/coordinating care for symptomatic hemorrhoids.

## 2023-11-24 ENCOUNTER — Ambulatory Visit
Admission: RE | Admit: 2023-11-24 | Discharge: 2023-11-24 | Disposition: A | Discharge status: Home / Self Care | Source: Ambulatory Visit | Attending: Interventional Radiology | Admitting: Interventional Radiology

## 2023-11-24 DIAGNOSIS — K649 Unspecified hemorrhoids: Secondary | ICD-10-CM

## 2023-11-24 HISTORY — PX: IR RADIOLOGIST EVAL & MGMT: IMG5224

## 2023-12-07 ENCOUNTER — Other Ambulatory Visit: Payer: Self-pay | Admitting: Interventional Radiology

## 2023-12-07 DIAGNOSIS — K649 Unspecified hemorrhoids: Secondary | ICD-10-CM

## 2023-12-14 ENCOUNTER — Other Ambulatory Visit: Payer: Self-pay | Admitting: Internal Medicine

## 2023-12-14 DIAGNOSIS — R2242 Localized swelling, mass and lump, left lower limb: Secondary | ICD-10-CM

## 2023-12-15 ENCOUNTER — Other Ambulatory Visit (HOSPITAL_COMMUNITY): Payer: Self-pay | Admitting: Student

## 2023-12-15 DIAGNOSIS — D62 Acute posthemorrhagic anemia: Secondary | ICD-10-CM

## 2023-12-15 NOTE — Progress Notes (Signed)
 Patient for IR rectal arteries angiogram & embolization on Friday 12/16/23, I called and spoke with the patient on the phone and gave pre-procedure instructions. Pt was made aware to be here at 10:30a, NPO after MN prior to procedure as well as driver post procedure/recovery/discharge. Pt stated understanding. Called 12/15/23  Dr Wilkie Lent stated that the patient can continue to take ASA 81mg  and Eliquis. She does not need to hold these medications for this procedure. 12/12/2023

## 2023-12-15 NOTE — H&P (Signed)
 Chief Complaint: Patient was seen in consultation today for symptomatic hemorrhoids   Procedure: Embolization of superior rectal arteries  Referring Physician(s): Dr. Jonita  Supervising Physician: Karalee Beat  Patient Status: [facility] - Out-pt  History of Present Illness: Amy Huynh is a 68 y.o. female with a history of fibromyalgia, IBS, diverticulosis, traumatic head injury, Sjogren's syndrome, elevated von Willebrand factor, CKD, stroke, and DVT. She has had numerous ED visits/hospitalizations in the past year related to recurrent strokes and there is concern for CNS vasculitis or Bechet's disease. Her last CVA was April 2025. She has been on rituximab infusions in addition to steroids, methotrexate , prasterone, and apremilast  for her vWF levels. She has also been on various treatment regimens including aspirin , Plavix, Eliquis, and lovenox . Since starting Eliquis in early 2025 she has begun to experience intermittent rectal bleeding and epistaxis. She has previously had banding on her hemorrhoids in July 2024, but a repeat banding would require interruption of her anticoagulation for up to 6 days. She continues to follow with Hematology and is now taking Eliquis and ASA. She was seen in consultation for rectal artery embolization by Dr. Karalee in early July  She presents today with her son Amy Huynh. States that she continues to feel weak and fatigued and have intermittent abdominal pain, worse on the right. She denies any shortness of breath, chest pain, changes in bowel/bladder habits, fever/chills. NPO since midnight. All questions and concerns answered at the bedside.   Code Status: Full Code  Past Medical History:  Diagnosis Date   Arthritis    DDD (degenerative disc disease), cervical    DDD (degenerative disc disease), lumbar    Family history of ovarian cancer 12/2016   MyRisk neg   Fibromyalgia    Migraine    Neuropathy    Placenta previa    Sjogren's  disease (HCC)     Past Surgical History:  Procedure Laterality Date   BIOPSY N/A 06/26/2021   Procedure: BIOPSY;  Surgeon: Jinny Carmine, MD;  Location: [facility];  Service: Endoscopy;  Laterality: N/A;   CERVICAL FUSION     CESAREAN SECTION     CHOLECYSTECTOMY     COLONOSCOPY N/A 11/22/2022   Procedure: COLONOSCOPY;  Surgeon: Toledo, Ladell POUR, MD;  Location: [facility] END[facility];  Service: Gastroenterology;  Laterality: N/A;   ESOPHAGOGASTRODUODENOSCOPY (EGD) WITH PROPOFOL  N/A 06/26/2021   Procedure: ESOPHAGOGASTRODUODENOSCOPY (EGD) WITH PROPOFOL ;  Surgeon: Jinny Carmine, MD;  Location: [facility];  Service: Endoscopy;  Laterality: N/A;   HERNIA REPAIR     IR RADIOLOGIST EVAL & MGMT  11/24/2023   LAPAROTOMY  03/11/2022   Procedure: EXPLORATORY LAPAROTOMY Mesenteric mass biopsy;  Surgeon: Tye Millet, DO;  Location: [facility] ORS;  Service: General;;   REPLACEMENT TOTAL KNEE Left    SHOULDER SURGERY Right     Allergies: Hydrocodone-acetaminophen , Hydrocodone-acetaminophen , Morphine, Morphine and codeine, Cymbalta [duloxetine hcl], Doxycycline, Duloxetine, and Pregabalin  Medications: Prior to Admission medications   Medication Sig Start Date End Date Taking? Authorizing Provider  Apremilast  (OTEZLA ) 30 MG TABS Take 30 mg by mouth 2 (two) times daily.    [provider]  azithromycin  (ZITHROMAX ) 250 MG tablet Take 250 mg by mouth daily. 08/05/22   [provider]  butalbital -acetaminophen -caffeine  (FIORICET , ESGIC ) 50-325-40 MG tablet Take 1 tablet by mouth every 6 (six) hours as needed for headache.    [provider]  cetirizine (ZYRTEC) 10 MG tablet Take 10 mg by mouth daily.    [provider]  clobetasol   cream (TEMOVATE ) 0.05 % Apply topically. 09/07/17   [provider]  cyanocobalamin  1000 MCG tablet Take 1,000 mcg by mouth daily.    [provider]  dexlansoprazole  (DEXILANT ) 60 MG capsule TAKE 1 CAPSULE(60 MG) BY MOUTH  DAILY 09/15/23   Jinny Carmine, MD  DHEA 50 MG TABS Take 1 tablet by mouth in the morning. 11/09/19   [provider]  Eptinezumab-jjmr (VYEPTI) 100 MG/ML injection Inject 100 mg into the vein every 3 (three) months. Last Saturday    [provider]  fexofenadine (ALLEGRA) 180 MG tablet Take 180 mg by mouth daily.    [provider]  folic acid  (FOLVITE ) 1 MG tablet Take 1 mg by mouth daily. 08/16/19   [provider]  furosemide (LASIX) 20 MG tablet Take 20 mg by mouth daily. 05/24/22 05/24/23  [provider]  gabapentin  (NEURONTIN ) 300 MG capsule Take 900 mg by mouth at bedtime.    [provider]  HYDROcodone-acetaminophen  (NORCO/VICODIN) 5-325 MG tablet Take 1 tablet by mouth every 8 (eight) hours as needed for moderate pain (neurapthy).    [provider]  hydrocortisone  (ANUSOL -HC) 2.5 % rectal cream Place 1 Application rectally 2 (two) times daily. Apply pea-sized cream per rectum twice daily for 7 to 10 days. 09/21/23   Unk Corinn Skiff, MD  hydrOXYzine  (ATARAX /VISTARIL ) 10 MG tablet Take 10 mg by mouth every 8 (eight) hours as needed.    [provider]  hyoscyamine  (LEVSIN , ANASPAZ ) 0.125 MG tablet Take 0.125 mg by mouth every 4 (four) hours as needed.    [provider]  methocarbamol  (ROBAXIN ) 500 MG tablet Take 500 mg by mouth every 8 (eight) hours as needed for muscle spasms (RLS). 08/22/17   [provider]  methotrexate  (RHEUMATREX) 2.5 MG tablet Take 20 mg by mouth once a week. Take 4 tablets (10 mg ) by mouth in the morning and 4 tablets(10 mg by mouth in the evening on Saturdays (total= 20 mg) 02/08/18   [provider]  ondansetron  (ZOFRAN -ODT) 4 MG disintegrating tablet Take 1 tablet (4 mg total) by mouth every 8 (eight) hours as needed for nausea or vomiting. 05/06/22   Viviann Pastor, MD  oxyCODONE -acetaminophen  (PERCOCET/ROXICET) 5-325 MG tablet Take 1 tablet by mouth at bedtime as needed.     [provider]  pilocarpine  (SALAGEN ) 5 MG tablet Take 5 mg by mouth 2 (two) times daily.    [provider]  promethazine  (PHENERGAN ) 25 MG tablet Take 25 mg by mouth every 6 (six) hours as needed for nausea or vomiting.    [provider]  rizatriptan (MAXALT) 10 MG tablet Take 10 mg by mouth as needed for migraine. May repeat in 2 hours if needed    [provider]  rOPINIRole  (REQUIP ) 2 MG tablet Take 2 mg by mouth every evening.    [provider]  topiramate  (TOPAMAX ) 100 MG tablet Take 200 mg by mouth at bedtime.    [provider]  valACYclovir (VALTREX) 1000 MG tablet  07/14/16   [provider]     Family History  Problem Relation Age of Onset   Ovarian cancer Maternal Aunt 30   Uterine cancer Mother        51s   COPD Mother    Hypertension Mother    Uterine cancer Maternal Grandmother        ? age   Heart attack Father 75   Other Father  DDD    Social History   Socioeconomic History   Marital status: Married    Spouse name: Not on file   Number of children: Not on file   Years of education: Not on file   Highest education level: Not on file  Occupational History   Not on file  Tobacco Use   Smoking status: Never   Smokeless tobacco: Never  Vaping Use   Vaping status: Never Used  Substance and Sexual Activity   Alcohol use: Not Currently   Drug use: Never   Sexual activity: Not Currently    Birth control/protection: Post-menopausal  Other Topics Concern   Not on file  Social History Narrative   Not on file   Social Drivers of Health   Financial Resource Strain: Low Risk  (12/12/2023)   Received from [facility]   Overall Financial Resource Strain (CARDIA)    Difficulty of Paying Living Expenses: Not hard at all  Food Insecurity: No Food Insecurity (12/12/2023)   Received from [facility]   Hunger Vital Sign    Within the past 12 months, you  worried that your food would run out before you got the money to buy more.: Never true    Within the past 12 months, the food you bought just didn't last and you didn't have money to get more.: Never true  Transportation Needs: No Transportation Needs (12/12/2023)   Received from [facility] - Transportation    In the past 12 months, has lack of transportation kept you from medical appointments or from getting medications?: No    Lack of Transportation (Non-Medical): No  Physical Activity: Unknown (11/18/2020)   Received from [facility]   Exercise Vital Sign    On average, how many days per week do you engage in moderate to strenuous exercise (like a brisk walk)?: 0 days    Minutes of Exercise per Session: Not on file  Recent Concern: Physical Activity - Inactive (11/18/2020)   Received from [facility]   Exercise Vital Sign    Days of Exercise per Week: 0 days    Minutes of Exercise per Session: 0 min  Stress: No Stress Concern Present (11/18/2020)   Received from [facility] - Occupational Stress Questionnaire    Feeling of Stress : Only a little  Social Connections: Not on file    Review of Systems  Constitutional:  Positive for fatigue.  Gastrointestinal:  Positive for abdominal pain (R>L).  Neurological:  Positive for weakness.  Denies any N/V, chest pain, shortness of breath, fevers/chills. All other ROS negative.  Vital Signs: BP 137/65   Pulse 63   Temp 97.9 F (36.6 C) (Oral)   Resp 15   Ht 5' 2 (1.575 m)   Wt 172 lb 6.4 oz (78.2 kg)   SpO2 98%   BMI 31.53 kg/m    Physical Exam HENT:     Head: Normocephalic and atraumatic.     Mouth/Throat:     Mouth: Mucous membranes are moist.     Pharynx: Oropharynx is clear.  Cardiovascular:     Rate and Rhythm: Normal rate and regular rhythm.     Heart sounds: Normal heart sounds.  Pulmonary:      Effort: Pulmonary effort is normal.     Breath sounds: Normal breath sounds.  Abdominal:     General: Abdomen is flat.  Palpations: Abdomen is soft.     Tenderness: There is abdominal tenderness (throughout, but worse on right).  Skin:    General: Skin is warm and dry.  Neurological:     Mental Status: She is alert and oriented to person, place, and time.  Psychiatric:        Behavior: Behavior normal.     Imaging: IR Radiologist Eval & Mgmt Result Date: 11/24/2023 EXAM: NEW PATIENT OFFICE VISIT CHIEF COMPLAINT: SEE EPIC NOTE HISTORY OF PRESENT ILLNESS: SEE EPIC NOTE REVIEW OF SYSTEMS: SEE EPIC NOTE PHYSICAL EXAMINATION: SEE EPIC NOTE ASSESSMENT AND PLAN: SEE EPIC NOTE Electronically Signed   By: Wilkie Lent M.D.   On: 11/24/2023 09:47    Labs:  CBC: Recent Labs    09/21/23 1145 12/16/23 1058  WBC 4.2 4.1  HGB 13.1 11.6*  HCT 40.2 35.4*  PLT 167 190    COAGS: Recent Labs    12/16/23 1058  INR 1.2    BMP: No results for input(s): NA, K, CL, CO2, GLUCOSE, BUN, CALCIUM, CREATININE, GFRNONAA, GFRAA in the last 8760 hours.  Invalid input(s): CMP  LIVER FUNCTION TESTS: No results for input(s): BILITOT, AST, ALT, ALKPHOS, PROT, ALBUMIN in the last 8760 hours.  TUMOR MARKERS: No results for input(s): AFPTM, CEA, CA199, CHROMGRNA in the last 8760 hours.  Assessment and Plan:  Symptomatic hemorrhoids: Neyda Durango is a 68 y.o. female seen in consultation for possible embolization of superior rectal arteries.   -NPO since midnight -Labs reviewed, VSS -Plan for embolization in IR with Dr. Lent  The Risks and benefits of embolization were discussed with the patient including, but not limited to bleeding, infection, vascular injury, post operative pain, or contrast induced renal failure.  This procedure involves the use of X-rays and because of the nature of the planned procedure, it is possible that we  will have prolonged use of X-ray fluoroscopy.  Potential radiation risks to you include (but are not limited to) the following: - A slightly elevated risk for cancer several years later in life. This risk is typically less than 0.5% percent. This risk is low in comparison to the normal incidence of human cancer, which is 33% for women and 50% for men according to the American Cancer Society. - Radiation induced injury can include skin redness, resembling a rash, tissue breakdown / ulcers and hair loss (which can be temporary or permanent).   The likelihood of either of these occurring depends on the difficulty of the procedure and whether you are sensitive to radiation due to previous procedures, disease, or genetic conditions.   IF your procedure requires a prolonged use of radiation, you will be notified and given written instructions for further action.  It is your responsibility to monitor the irradiated area for the 2 weeks following the procedure and to notify your physician if you are concerned that you have suffered a radiation induced injury.    All of the patient's questions were answered, patient is agreeable to proceed. Consent signed and in chart.   Thank you for this interesting consult. I greatly enjoyed meeting [employer] and look forward to participating in their care. A copy of this report was sent to the requesting provider on this date.  Electronically Signed: Geralda Baumgardner M Trequan Marsolek, PA-C 12/16/2023, 11:35 AM   I spent a total of  25 Minutes in face to face clinical consultation, greater than 50% of which was counseling/coordinating care for symptomatic hemorrhoids.

## 2023-12-16 ENCOUNTER — Other Ambulatory Visit: Payer: Self-pay

## 2023-12-16 ENCOUNTER — Ambulatory Visit
Admission: RE | Admit: 2023-12-16 | Discharge: 2023-12-16 | Disposition: A | Discharge status: Home / Self Care | Source: Ambulatory Visit | Attending: Interventional Radiology | Admitting: Interventional Radiology

## 2023-12-16 ENCOUNTER — Encounter: Payer: Self-pay | Admitting: Radiology

## 2023-12-16 VITALS — BP 129/57 | HR 77 | Temp 98.0°F | Resp 14 | Ht 62.0 in | Wt 172.4 lb

## 2023-12-16 DIAGNOSIS — Z7901 Long term (current) use of anticoagulants: Secondary | ICD-10-CM | POA: Insufficient documentation

## 2023-12-16 DIAGNOSIS — Z7982 Long term (current) use of aspirin: Secondary | ICD-10-CM | POA: Insufficient documentation

## 2023-12-16 DIAGNOSIS — K649 Unspecified hemorrhoids: Secondary | ICD-10-CM | POA: Diagnosis present

## 2023-12-16 DIAGNOSIS — D62 Acute posthemorrhagic anemia: Secondary | ICD-10-CM

## 2023-12-16 DIAGNOSIS — M797 Fibromyalgia: Secondary | ICD-10-CM | POA: Insufficient documentation

## 2023-12-16 DIAGNOSIS — N189 Chronic kidney disease, unspecified: Secondary | ICD-10-CM | POA: Insufficient documentation

## 2023-12-16 DIAGNOSIS — K589 Irritable bowel syndrome without diarrhea: Secondary | ICD-10-CM | POA: Diagnosis not present

## 2023-12-16 DIAGNOSIS — M3500 Sicca syndrome, unspecified: Secondary | ICD-10-CM | POA: Insufficient documentation

## 2023-12-16 DIAGNOSIS — Z79899 Other long term (current) drug therapy: Secondary | ICD-10-CM | POA: Diagnosis not present

## 2023-12-16 DIAGNOSIS — Z7902 Long term (current) use of antithrombotics/antiplatelets: Secondary | ICD-10-CM | POA: Insufficient documentation

## 2023-12-16 DIAGNOSIS — Z8673 Personal history of transient ischemic attack (TIA), and cerebral infarction without residual deficits: Secondary | ICD-10-CM | POA: Insufficient documentation

## 2023-12-16 HISTORY — PX: IR EMBO ART  VEN HEMORR LYMPH EXTRAV  INC GUIDE ROADMAPPING: IMG5450

## 2023-12-16 LAB — BASIC METABOLIC PANEL WITH GFR
Anion gap: 11 (ref 5–15)
BUN: 14 mg/dL (ref 8–23)
CO2: 22 mmol/L (ref 22–32)
Calcium: 9 mg/dL (ref 8.9–10.3)
Chloride: 110 mmol/L (ref 98–111)
Creatinine, Ser: 0.98 mg/dL (ref 0.44–1.00)
GFR, Estimated: >60 mL/min (ref >60)
Glucose, Bld: 97 mg/dL (ref 70–99)
Potassium: 4 mmol/L (ref 3.5–5.1)
Sodium: 143 mmol/L (ref 135–145)

## 2023-12-16 LAB — CBC
HCT: 35.4 % — ABNORMAL LOW (ref 36.0–46.0)
Hemoglobin: 11.6 g/dL — ABNORMAL LOW (ref 12.0–15.0)
MCH: 30.9 pg (ref 26.0–34.0)
MCHC: 32.8 g/dL (ref 30.0–36.0)
MCV: 94.1 fL (ref 80.0–100.0)
Platelets: 190 K/uL (ref 150–400)
RBC: 3.76 MIL/uL — ABNORMAL LOW (ref 3.87–5.11)
RDW: 15.1 % (ref 11.5–15.5)
WBC: 4.1 K/uL (ref 4.0–10.5)
nRBC: 0 % (ref 0.0–0.2)

## 2023-12-16 LAB — PROTIME-INR
INR: 1.2 (ref 0.8–1.2)
Prothrombin Time: 15.8 s — ABNORMAL HIGH (ref 11.4–15.2)

## 2023-12-16 MED ORDER — IOHEXOL 300 MG/ML  SOLN
125.0000 mL | Freq: Once | INTRAMUSCULAR | Status: AC | PRN
  Administered 2023-12-16: 30 mL

## 2023-12-16 MED ORDER — LIDOCAINE HCL 1 % IJ SOLN
INTRAMUSCULAR | Status: AC
  Filled 2023-12-16: qty 20

## 2023-12-16 MED ORDER — LIDOCAINE HCL 1 % IJ SOLN
10.0000 mL | Freq: Once | INTRAMUSCULAR | Status: AC
  Administered 2023-12-16: 5 mL via INTRADERMAL

## 2023-12-16 MED ORDER — FENTANYL CITRATE (PF) 100 MCG/2ML IJ SOLN
INTRAMUSCULAR | Status: AC
Start: 2023-12-16 — End: 2023-12-16
  Filled 2023-12-16: qty 2

## 2023-12-16 MED ORDER — OXYCODONE-ACETAMINOPHEN 5-325 MG PO TABS
1.0000 | ORAL_TABLET | Freq: Once | ORAL | Status: AC
  Administered 2023-12-16: 1 via ORAL

## 2023-12-16 MED ORDER — HEPARIN SOD (PORK) LOCK FLUSH 100 UNIT/ML IV SOLN: 500.0000 [IU] | Freq: Once | INTRAVENOUS | Status: AC

## 2023-12-16 MED ORDER — MIDAZOLAM HCL 2 MG/2ML IJ SOLN
INTRAMUSCULAR | Status: AC
  Filled 2023-12-16: qty 2

## 2023-12-16 MED ORDER — HEPARIN SOD (PORK) LOCK FLUSH 100 UNIT/ML IV SOLN
INTRAVENOUS | Status: AC
  Administered 2023-12-16: 500 [IU] via INTRAVENOUS
  Filled 2023-12-16: qty 5

## 2023-12-16 MED ORDER — SODIUM CHLORIDE 0.9 % IV SOLN: INTRAVENOUS | Status: DC

## 2023-12-16 MED ORDER — MIDAZOLAM HCL 5 MG/5ML IJ SOLN
INTRAMUSCULAR | Status: AC | PRN
Start: 2023-12-16 — End: 2023-12-16
  Administered 2023-12-16 (×2): .5 mg via INTRAVENOUS
  Administered 2023-12-16: 1 mg via INTRAVENOUS

## 2023-12-16 MED ORDER — OXYCODONE-ACETAMINOPHEN 5-325 MG PO TABS
ORAL_TABLET | ORAL | Status: AC
  Filled 2023-12-16: qty 1

## 2023-12-16 MED ORDER — FENTANYL CITRATE (PF) 100 MCG/2ML IJ SOLN
INTRAMUSCULAR | Status: AC | PRN
  Administered 2023-12-16: 25 ug via INTRAVENOUS
  Administered 2023-12-16: 50 ug via INTRAVENOUS
  Administered 2023-12-16: 25 ug via INTRAVENOUS

## 2023-12-16 NOTE — Progress Notes (Signed)
 Patient clinically stable post IR rectal artery embolization per Dr Karalee, tolerated well. Vitals stable pre and post procedure/received Versed  2 mg along with Fentanyl  100 mcg IV for procedure. Right groin without bleeding nor hematoma. Denies complaints immediately post procedure. Report given to Laymon Fish RN post procedure/specials/24

## 2023-12-16 NOTE — Procedures (Signed)
 Interventional Radiology Procedure Note  Procedure: Superior rectal artery embolization  Complications: None  Estimated Blood Loss: None  Recommendations: - Bedrest x 2 hrs - DC home   Signed,  Wilkie LOIS Lent, MD

## 2023-12-28 ENCOUNTER — Other Ambulatory Visit: Payer: Self-pay | Admitting: Medical Genetics

## 2023-12-29 ENCOUNTER — Ambulatory Visit
Admission: RE | Admit: 2023-12-29 | Discharge: 2023-12-29 | Disposition: A | Discharge status: Home / Self Care | Source: Ambulatory Visit | Attending: Radiology | Admitting: Radiology

## 2023-12-29 DIAGNOSIS — K649 Unspecified hemorrhoids: Secondary | ICD-10-CM

## 2023-12-29 HISTORY — PX: IR RADIOLOGIST EVAL & MGMT: IMG5224

## 2023-12-29 NOTE — Progress Notes (Signed)
 Chief Complaint: Patient was seen in consultation today for No chief complaint on file.  at the request of McInnis,Caitlyn M  Referring Physician(s): McInnis,Caitlyn M  History of Present Illness: Amy Huynh is a 68 y.o. female with a medical history significant for anxiety/depression, PTSD, fibromyalgia, IBS, diverticulosis, interstitial lung disease, traumatic head injury, HTN, migraines, Sjogren's syndrome, chronic kidney disease, stroke and DVT. She has had numerous ED visits/hospitalizations in the past year related to recurrent strokes and there is concern for CNS vasculitis or Behcet's disease. Her last CVA was April 2025. She has a history of elevated von Willebrand factor levels and has been on rituximab infusions in addition to steroids, methotrexate , prasterone and apremilast .     She has been on various treatment regimens including aspirin , Plavix, Eliquis and lovenox . Since starting Eliquis in early 2025 she began to experience intermittent rectal bleeding and epistaxis. The bleeding can last up to 48 hours. She previously had banding of her hemorrhoids July 2024 (prior to her being on anticoagulation) and a repeat banding would require interruption of her anticoagulation regimen for up to 6 days total.    She is being followed by Hematology and the patient is now taking Dabigatran and Aspirin . Her hematology team has proposed a therapeutic intervention to address the GI bleeding and the patient has been referred to Interventional Radiology to discuss possible embolization.  We proceeded with an Emborrhoid (embolization of the superior rectal arteries) procedure on 12/16/23.  I spoke with Amy Huynh via [employer] today.  She is doing very well!  No complaints or complications.  She denies any further episodes of rectal bleeding.   Past Medical History:  Diagnosis Date   Arthritis    DDD (degenerative disc disease), cervical    DDD (degenerative disc disease),  lumbar    Family history of ovarian cancer 12/2016   MyRisk neg   Fibromyalgia    Migraine    Neuropathy    Placenta previa    Sjogren's disease (HCC)     Past Surgical History:  Procedure Laterality Date   BIOPSY N/A 06/26/2021   Procedure: BIOPSY;  Surgeon: Jinny Carmine, MD;  Location: [facility];  Service: Endoscopy;  Laterality: N/A;   CERVICAL FUSION     CESAREAN SECTION     CHOLECYSTECTOMY     COLONOSCOPY N/A 11/22/2022   Procedure: COLONOSCOPY;  Surgeon: Toledo, Ladell POUR, MD;  Location: [facility];  Service: Gastroenterology;  Laterality: N/A;   ESOPHAGOGASTRODUODENOSCOPY (EGD) WITH PROPOFOL  N/A 06/26/2021   Procedure: ESOPHAGOGASTRODUODENOSCOPY (EGD) WITH PROPOFOL ;  Surgeon: Jinny Carmine, MD;  Location: [facility];  Service: Endoscopy;  Laterality: N/A;   HERNIA REPAIR     IR EMBO ART  VEN HEMORR LYMPH EXTRAV  INC GUIDE ROADMAPPING  12/16/2023   IR RADIOLOGIST EVAL & MGMT  11/24/2023   LAPAROTOMY  03/11/2022   Procedure: EXPLORATORY LAPAROTOMY Mesenteric mass biopsy;  Surgeon: Tye Millet, DO;  Location: [facility];  Service: General;;   REPLACEMENT TOTAL KNEE Left    SHOULDER SURGERY Right     Allergies: Hydrocodone-acetaminophen , Hydrocodone-acetaminophen , Morphine, Morphine and codeine, Cymbalta [duloxetine hcl], Doxycycline, Duloxetine, and Pregabalin  Medications: Prior to Admission medications   Medication Sig Start Date End Date Taking? Authorizing Provider  Apremilast  (OTEZLA ) 30 MG TABS Take 30 mg by mouth 2 (two) times daily.    [provider]  azithromycin  (ZITHROMAX ) 250 MG tablet Take 250 mg by mouth daily. Patient not taking: Reported on 12/16/2023 08/05/22   [provider]  butalbital -acetaminophen -caffeine  (FIORICET , ESGIC ) 50-325-40 MG tablet Take 1 tablet by mouth every 6 (six) hours as needed for headache.    [provider]  cetirizine (ZYRTEC) 10 MG tablet Take 10 mg by mouth daily.    [provider]  clobetasol  cream (TEMOVATE ) 0.05 % Apply topically. 09/07/17   [provider]  cyanocobalamin  1000 MCG tablet Take 1,000 mcg by mouth daily.    [provider]  cyclobenzaprine (FLEXERIL) 10 MG tablet Take 10 mg by mouth 3 (three) times daily as needed for muscle spasms.    [provider]  dexlansoprazole  (DEXILANT ) 60 MG capsule TAKE 1 CAPSULE(60 MG) BY MOUTH DAILY 09/15/23   Jinny Carmine, MD  DHEA 50 MG TABS Take 1 tablet by mouth in the morning. 11/09/19   [provider]  Eptinezumab-jjmr (VYEPTI) 100 MG/ML injection Inject 100 mg into the vein every 3 (three) months. Last Saturday    [provider]  fexofenadine (ALLEGRA) 180 MG tablet Take 180 mg by mouth daily.    [provider]  folic acid  (FOLVITE ) 1 MG tablet Take 1 mg by mouth daily. 08/16/19   [provider]  furosemide (LASIX) 20 MG tablet Take 20 mg by mouth daily. 05/24/22 12/16/23  [provider]  gabapentin  (NEURONTIN ) 300 MG capsule Take 900 mg by mouth at bedtime.    [provider]  HYDROcodone-acetaminophen  (NORCO/VICODIN) 5-325 MG tablet Take 1 tablet by mouth every 8 (eight) hours as needed for moderate pain (neurapthy). Patient not taking: Reported on 12/16/2023    [provider]  hydrocortisone  (ANUSOL -HC) 2.5 % rectal cream Place 1 Application rectally 2 (two) times daily. Apply pea-sized cream per rectum twice daily for 7 to 10 days. 09/21/23   Unk Corinn Skiff, MD  hydrOXYzine  (ATARAX /VISTARIL ) 10 MG tablet Take 10 mg by mouth every 8 (eight) hours as needed. Patient not taking: Reported on 12/16/2023    [provider]  hyoscyamine  (LEVSIN , ANASPAZ ) 0.125 MG tablet Take 0.125 mg by mouth every 4 (four) hours as needed. Patient not taking: Reported on 12/16/2023    [provider]  methocarbamol  (ROBAXIN ) 500 MG tablet Take 500 mg by mouth every 8 (eight) hours as needed for muscle spasms (RLS). 08/22/17   [provider]  methotrexate  (RHEUMATREX) 2.5 MG tablet Take 20 mg by mouth once a week. Take 4 tablets (10 mg ) by mouth in the morning and 4 tablets(10 mg by mouth in the evening on Saturdays (total= 20 mg) 02/08/18   [provider]  ondansetron  (ZOFRAN -ODT) 4 MG disintegrating tablet Take 1 tablet (4 mg total) by mouth every 8 (eight) hours as needed for nausea or vomiting. 05/06/22   Viviann Pastor, MD  oxyCODONE -acetaminophen  (PERCOCET/ROXICET) 5-325 MG tablet Take 1 tablet by mouth at bedtime as needed.    [provider]  pilocarpine  (SALAGEN ) 5 MG tablet Take 5 mg by mouth 2 (two) times daily.    [provider]  potassium chloride  (KLOR-CON ) 10 MEQ tablet Take 40 mEq by mouth daily. Unsure of dosage    [provider]  promethazine  (PHENERGAN ) 25 MG tablet Take 25 mg by mouth every 6 (six) hours as needed for nausea or vomiting.    [provider]  rizatriptan (MAXALT) 10 MG tablet Take 10 mg by mouth as needed for migraine. May repeat in 2 hours if needed Patient not taking: Reported on 12/16/2023    [provider]  rOPINIRole  (REQUIP ) 2 MG  tablet Take 2 mg by mouth every evening.    [provider]  topiramate  (TOPAMAX ) 100 MG tablet Take 200 mg by mouth at bedtime.    [provider]  valACYclovir (VALTREX) 1000 MG tablet  07/14/16   [provider]     Family History  Problem Relation Age of Onset   Ovarian cancer Maternal Aunt 30   Uterine cancer Mother        105s   COPD Mother    Hypertension Mother    Uterine cancer Maternal Grandmother        ? age   Heart attack Father 88   Other Father        DDD    Social History   Socioeconomic History   Marital status: Married    Spouse name: Not on file   Number of children: Not on file   Years of education: Not on file   Highest education level: Not on file  Occupational History   Not on file  Tobacco Use   Smoking status: Never    Smokeless tobacco: Never  Vaping Use   Vaping status: Never Used  Substance and Sexual Activity   Alcohol use: Not Currently   Drug use: Never   Sexual activity: Not Currently    Birth control/protection: Post-menopausal  Other Topics Concern   Not on file  Social History Narrative   Not on file   Social Drivers of Health   Financial Resource Strain: Low Risk  (12/12/2023)   Received from [facility]   Overall Financial Resource Strain (CARDIA)    Difficulty of Paying Living Expenses: Not hard at all  Food Insecurity: No Food Insecurity (12/12/2023)   Received from [facility]   Hunger Vital Sign    Within the past 12 months, you worried that your food would run out before you got the money to buy more.: Never true    Within the past 12 months, the food you bought just didn't last and you didn't have money to get more.: Never true  Transportation Needs: No Transportation Needs (12/12/2023)   Received from [facility] - Transportation    In the past 12 months, has lack of transportation kept you from medical appointments or from getting medications?: No    Lack of Transportation (Non-Medical): No  Physical Activity: Unknown (11/18/2020)   Received from [facility]   Exercise Vital Sign    On average, how many days per week do you engage in moderate to strenuous exercise (like a brisk walk)?: 0 days    Minutes of Exercise per Session: Not on file  Recent Concern: Physical Activity - Inactive (11/18/2020)   Received from [facility]   Exercise Vital Sign    Days of Exercise per Week: 0 days    Minutes of Exercise per Session: 0 min  Stress: No Stress Concern Present (11/18/2020)   Received from [facility] - Occupational Stress Questionnaire    Feeling of Stress : Only a little  Social Connections: Not on file    Review of  Systems: A 12 point ROS discussed and pertinent positives are indicated in the HPI above.  All other systems are negative.  Review of Systems  Vital Signs: There were no vitals taken for this visit.    Physical Exam Constitutional:      General: She is not  in acute distress.    Appearance: Normal appearance. She is not toxic-appearing.  HENT:     Head: Normocephalic and atraumatic.  Eyes:     General: No scleral icterus. Pulmonary:     Effort: Pulmonary effort is normal.  Neurological:     Mental Status: She is alert and oriented to person, place, and time.  Psychiatric:        Behavior: Behavior normal.       Imaging: IR EMBO ART  VEN HEMORR LYMPH EXTRAV  INC GUIDE ROADMAPPING Result Date: 12/16/2023 INDICATION: 68 year old female with frequent transient ischemic attacks necessitating anticoagulation. Unfortunately, this has resulted in frequent rectal bleeding due to her hemorrhoids. He presents for embolization of the superior rectal arteries to decrease the frequency and severity of her hemorrhoidal bleeds. EXAM: IR EMBO ART  VEN HEMORR LYMPH EXTRAV  INC GUIDE ROADMAPPING MEDICATIONS: None. ANESTHESIA/SEDATION: Moderate (conscious) sedation was employed during this procedure. A total of Versed  2 mg and Fentanyl  100 mcg was administered intravenously by the Radiology nurse. Moderate Sedation Time: 37 minutes. The patient's level of consciousness and vital signs were monitored continuously by radiology nursing throughout the procedure under my direct supervision. CONTRAST:  30mL OMNIPAQUE  IOHEXOL  300 MG/ML  SOLN FLUOROSCOPY: Radiation Exposure Index (as provided by the fluoroscopic device): 319 mGy Kerma COMPLICATIONS: None immediate. PROCEDURE: Informed consent was obtained from the patient following explanation of the procedure, risks, benefits and alternatives. The patient understands, agrees and consents for the procedure. All questions were addressed. A time out was performed  prior to the initiation of the procedure. Maximal barrier sterile technique utilized including caps, mask, sterile gowns, sterile gloves, large sterile drape, hand hygiene, and Betadine prep. The right common femoral artery was interrogated with ultrasound and found to be widely patent. An image was obtained and stored for the medical record. Local anesthesia was attained by infiltration with 1% lidocaine . A small dermatotomy was made. Under real-time sonographic guidance, the vessel was punctured with a 21 gauge micropuncture needle. Using standard technique, the initial micro needle was exchanged over a 0.018 micro wire for a transitional 4 [place] micro sheath. The micro sheath was then exchanged over a 0.035 wire for a 5 French vascular sheath. A rim catheter was advanced over a Bentson wire into the abdominal aorta and used to select the inferior mesenteric artery. An arteriogram was performed. The superior rectal arteries are well visualized. A Cook cantata 2.5 French microcatheter was advanced over a Fathom 16 wire and used to select the right superior rectal artery. Arteriography was performed confirming perfusion of the hemorrhoidal cushion. Coil embolization was performed using a series of detachable penumbra microcoils. Follow-up arteriography demonstrates successful embolization. Next, the microcatheter was advanced into the left superior rectal artery. Injection was again performed confirming opacification of the hemorrhoidal cushion. Coil embolization was performed using several penumbra detachable microcoils. Repeat arteriography demonstrates a more proximal branch the provides some collateral supply to the hemorrhoidal cushion. The microcatheter was advanced into this more proximal branch of the superior rectal artery and coil embolization was performed with a single penumbra detachable microcoils. Microcatheter was brought back proximally and digital subtraction angiography was performed. Successful  embolization of all branches providing supply to the hemorrhoidal cushion. No evidence of arterial flow below the level of the superior pubic rami. The catheters were removed. Hemostasis was attained with the assistance of a Celt arterial closure device. IMPRESSION: Successful embolization of the bilateral superior rectal arteries. Electronically Signed   By: Wilkie Lent  M.D.   On: 12/16/2023 13:55    Labs:  CBC: Recent Labs    09/21/23 1145 12/16/23 1058  WBC 4.2 4.1  HGB 13.1 11.6*  HCT 40.2 35.4*  PLT 167 190    COAGS: Recent Labs    12/16/23 1058  INR 1.2    BMP: Recent Labs    12/16/23 1058  NA 143  K 4.0  CL 110  CO2 22  GLUCOSE 97  BUN 14  CALCIUM 9.0  CREATININE 0.98  GFRNONAA >60    LIVER FUNCTION TESTS: No results for input(s): BILITOT, AST, ALT, ALKPHOS, PROT, ALBUMIN in the last 8760 hours.  TUMOR MARKERS: No results for input(s): AFPTM, CEA, CA199, CHROMGRNA in the last 8760 hours.  Assessment and Plan:  Extremely pleasant 68 year-old female now 2 weeks post emborrhoid procedure for bleeding internal hemorrhoids.  She is doing very well and is fully recovered without complication.  She is also pleased to report no further episodes of rectal bleeding.    - No further scheduled follow-up, we are available as needed at any time    Electronically Signed: Wilkie MARLA Lent 12/29/2023, 11:18 AM   This encounter was conducted via the [employer] providing interactive audio and visual communication.  The patient provided verbal consent to conduct a virtual appointment.  The patient was located at their primary residence during this encounter.  I spent a total of  15 Minutes in face to face in clinical consultation, greater than 50% of which was counseling/coordinating care for hemorrhoids

## 2024-01-23 ENCOUNTER — Other Ambulatory Visit
Admission: RE | Admit: 2024-01-23 | Discharge: 2024-01-23 | Disposition: A | Discharge status: Home / Self Care | Payer: Self-pay | Source: Ambulatory Visit | Attending: Medical Genetics | Admitting: Medical Genetics

## 2024-01-31 LAB — GENECONNECT MOLECULAR SCREEN: Genetic Analysis Overall Interpretation: NEGATIVE

## 2024-05-19 ENCOUNTER — Ambulatory Visit
Admission: EM | Admit: 2024-05-19 | Discharge: 2024-05-19 | Disposition: A | Discharge status: Home / Self Care | Attending: Physician Assistant | Admitting: Physician Assistant

## 2024-05-19 DIAGNOSIS — K1370 Unspecified lesions of oral mucosa: Secondary | ICD-10-CM | POA: Insufficient documentation

## 2024-05-19 DIAGNOSIS — N9089 Other specified noninflammatory disorders of vulva and perineum: Secondary | ICD-10-CM | POA: Diagnosis not present

## 2024-05-19 DIAGNOSIS — M352 Behcet's disease: Secondary | ICD-10-CM | POA: Diagnosis not present

## 2024-05-19 MED ORDER — TRIAMCINOLONE ACETONIDE 0.1 % MT PSTE: 1.0000 | PASTE | Freq: Two times a day (BID) | OROMUCOSAL | 12 refills | Status: AC

## 2024-05-19 MED ORDER — TRIAMCINOLONE ACETONIDE 0.1 % EX OINT: 1.0000 | TOPICAL_OINTMENT | Freq: Two times a day (BID) | CUTANEOUS | 0 refills | Status: AC

## 2024-05-19 NOTE — Discharge Instructions (Addendum)
-   Testing for herpes.  Will contact you if is positive and treat you further. 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  I sent a topical for your lip and for the vulva. - Continue following up with rheumatology.

## 2024-05-19 NOTE — ED Triage Notes (Signed)
 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   Pt states that she had a nerve decompression done on the left knee on 05/02/24

## 2024-05-19 NOTE — ED Provider Notes (Signed)
 " [facility]    CSN: [id number] Arrival date & time: 05/19/24  0840      History   Chief Complaint Chief Complaint  Patient presents with   Groin Pain    HPI Luann Aspinwall Tapscott is a 69 y.o. female presenting for approximately 2-day history of 3 small lesions of vulva.  She says they are painful.  She is unsure if it is related to underlying Behcet's disease.  She follows up with rheumatology for this and is receiving Vyepti infusions.  Rheumatologist wanted her to be checked for herpes and have that ruled out.  Patient denies known history of herpes.  She reports the start of a lesion of her inner lip today.  She states it normally starts out like this and then becomes a full-blown ulcer.  HPI  Past Medical History:  Diagnosis Date   Arthritis    DDD (degenerative disc disease), cervical    DDD (degenerative disc disease), lumbar    Family history of ovarian cancer 12/2016   MyRisk neg   Fibromyalgia    Migraine    Neuropathy    Placenta previa    Sjogren's disease     Patient Active Problem List   Diagnosis Date Noted   ABLA (acute blood loss anemia) 11/22/2022   Diverticulosis large intestine w/o perforation or abscess w/bleeding 11/22/2022   BRBPR (bright red blood per rectum) 11/21/2022   Volvulus of intestine (HCC) 03/11/2022   Melena    Heartburn    Acute peptic ulcer of stomach    Bradycardia 10/16/2020   Rash 07/15/2020   Stage 3a chronic kidney disease (HCC) 06/24/2020   Abnormal ECG 05/22/2020   History of degenerative disc disease 01/29/2020   LVH (left ventricular hypertrophy) 11/15/2019   Acute respiratory failure with hypoxia (HCC) 09/16/2019   Multilobar lung infiltrate 09/16/2019   Hypomagnesemia 09/16/2019   Muscle weakness (generalized) 09/16/2019   Community acquired pneumonia 09/12/2019   Hypokalemia 09/12/2019   Psoriasis 02/15/2019   Personal history of skin cancer 12/01/2018   Sjogren's syndrome 10/11/2017   Family  history of ovarian cancer 01/11/2017   Acute bursitis of right shoulder 06/07/2016   Partial tear of right rotator cuff 06/07/2016   Lumbar radiculopathy 07/23/2015   Migraine without aura and without status migrainosus, not intractable 04/23/2015   Chronic daily headache 04/23/2015   DDD (degenerative disc disease), cervical 07/06/2014   Neck pain 07/06/2014   Dyspareunia 12/14/2013   Benign essential hypertension 09/13/2013   Polyarthralgia 07/20/2013   Diverticula of colon 01/22/2013   Fibromyalgia 01/22/2013   Restless legs syndrome 01/22/2013   Moderate obstructive sleep apnea 01/22/2013   Migraine headache 01/22/2013   Insomnia 01/22/2013   Generalized anxiety disorder 01/22/2013   Esophageal reflux 01/22/2013   Atrophic vaginitis 01/22/2013   Anemia 01/22/2013   Allergic rhinitis 01/22/2013   Urticaria 01/18/2013   Low back pain 10/03/2012   Lumbosacral spondylosis 08/17/2012    Past Surgical History:  Procedure Laterality Date   BIOPSY N/A 06/26/2021   Procedure: BIOPSY;  Surgeon: Jinny Carmine, MD;  Location: [facility];  Service: Endoscopy;  Laterality: N/A;   CERVICAL FUSION     CESAREAN SECTION     CHOLECYSTECTOMY     COLONOSCOPY N/A 11/22/2022   Procedure: COLONOSCOPY;  Surgeon: Toledo, Ladell POUR, MD;  Location: [facility];  Service: Gastroenterology;  Laterality: N/A;   ESOPHAGOGASTRODUODENOSCOPY (EGD) WITH PROPOFOL  N/A 06/26/2021   Procedure: ESOPHAGOGASTRODUODENOSCOPY (EGD) WITH PROPOFOL ;  Surgeon: Jinny Carmine, MD;  Location: [facility];  Service: Endoscopy;  Laterality: N/A;   HERNIA REPAIR     IR EMBO ART  VEN HEMORR LYMPH EXTRAV  INC GUIDE ROADMAPPING  12/16/2023   IR RADIOLOGIST EVAL & MGMT  11/24/2023   IR RADIOLOGIST EVAL & MGMT  12/29/2023   LAPAROTOMY  03/11/2022   Procedure: EXPLORATORY LAPAROTOMY Mesenteric mass biopsy;  Surgeon: Tye Millet, DO;  Location: [facility];  Service: General;;   REPLACEMENT TOTAL KNEE Left    SHOULDER  SURGERY Right     OB History     Gravida  4   Para  3   Term  3   Preterm      AB  1   Living  2      SAB  1   IAB      Ectopic      Multiple      Live Births  3            Home Medications    Prior to Admission medications  Medication Sig Start Date End Date Taking? Authorizing Provider  Apremilast  (OTEZLA ) 30 MG TABS Take 30 mg by mouth 2 (two) times daily.   Yes [provider]  butalbital -acetaminophen -caffeine  (FIORICET , ESGIC ) 50-325-40 MG tablet Take 1 tablet by mouth every 6 (six) hours as needed for headache.   Yes [provider]  cetirizine (ZYRTEC) 10 MG tablet Take 10 mg by mouth daily.   Yes [provider]  clobetasol  cream (TEMOVATE ) 0.05 % Apply topically. 09/07/17  Yes [provider]  cyanocobalamin  1000 MCG tablet Take 1,000 mcg by mouth daily.   Yes [provider]  dexlansoprazole  (DEXILANT ) 60 MG capsule TAKE 1 CAPSULE(60 MG) BY MOUTH DAILY 09/15/23  Yes Jinny Carmine, MD  DHEA 50 MG TABS Take 1 tablet by mouth in the morning. 11/09/19  Yes [provider]  Eptinezumab-jjmr (VYEPTI) 100 MG/ML injection Inject 100 mg into the vein every 3 (three) months. Last Saturday   Yes [provider]  fexofenadine (ALLEGRA) 180 MG tablet Take 180 mg by mouth daily.   Yes [provider]  folic acid  (FOLVITE ) 1 MG tablet Take 1 mg by mouth daily. 08/16/19  Yes [provider]  gabapentin  (NEURONTIN ) 300 MG capsule Take 900 mg by mouth at bedtime.   Yes [provider]  hyoscyamine  (LEVSIN , ANASPAZ ) 0.125 MG tablet Take 0.125 mg by mouth every 4 (four) hours as needed.   Yes [provider]  methocarbamol  (ROBAXIN ) 500 MG tablet Take 500 mg by mouth every 8 (eight) hours as needed for muscle spasms (RLS). 08/22/17  Yes [provider]  methotrexate  (RHEUMATREX) 2.5 MG tablet Take 20 mg by mouth once a week. Take 4 tablets (10 mg ) by mouth in the morning and  4 tablets(10 mg by mouth in the evening on Saturdays (total= 20 mg) 02/08/18  Yes [provider]  ondansetron  (ZOFRAN -ODT) 4 MG disintegrating tablet Take 1 tablet (4 mg total) by mouth every 8 (eight) hours as needed for nausea or vomiting. 05/06/22  Yes Viviann Pastor, MD  oxyCODONE -acetaminophen  (PERCOCET/ROXICET) 5-325 MG tablet Take 1 tablet by mouth at bedtime as needed.   Yes [provider]  pilocarpine  (SALAGEN ) 5 MG tablet Take 5 mg by mouth 2 (two) times daily.   Yes [provider]  potassium chloride  (KLOR-CON ) 10 MEQ tablet Take 40 mEq by mouth daily. Unsure of dosage   Yes [provider]  promethazine  (PHENERGAN ) 25 MG  tablet Take 25 mg by mouth every 6 (six) hours as needed for nausea or vomiting.   Yes [provider]  rOPINIRole  (REQUIP ) 2 MG tablet Take 2 mg by mouth every evening.   Yes [provider]  topiramate  (TOPAMAX ) 100 MG tablet Take 200 mg by mouth at bedtime.   Yes [provider]  triamcinolone  (KENALOG ) 0.1 % paste Use as directed 1 Application in the mouth or throat 2 (two) times daily. 05/19/24  Yes Arvis Huxley B, PA-C  triamcinolone  ointment (KENALOG ) 0.1 % Apply 1 Application topically 2 (two) times daily. Apply to vulvar lesions BID 05/19/24  Yes Arvis Huxley NOVAK, PA-C  azithromycin  (ZITHROMAX ) 250 MG tablet Take 250 mg by mouth daily. Patient not taking: Reported on 12/16/2023 08/05/22   [provider]  cyclobenzaprine (FLEXERIL) 10 MG tablet Take 10 mg by mouth 3 (three) times daily as needed for muscle spasms.    [provider]  furosemide (LASIX) 20 MG tablet Take 20 mg by mouth daily. 05/24/22 12/16/23  [provider]  HYDROcodone-acetaminophen  (NORCO/VICODIN) 5-325 MG tablet Take 1 tablet by mouth every 8 (eight) hours as needed for moderate pain (neurapthy). Patient not taking: Reported on 12/16/2023    [provider]  hydrocortisone  (ANUSOL -HC) 2.5 % rectal  cream Place 1 Application rectally 2 (two) times daily. Apply pea-sized cream per rectum twice daily for 7 to 10 days. 09/21/23   Unk Corinn Skiff, MD  hydrOXYzine  (ATARAX /VISTARIL ) 10 MG tablet Take 10 mg by mouth every 8 (eight) hours as needed. Patient not taking: Reported on 12/16/2023    [provider]  rizatriptan (MAXALT) 10 MG tablet Take 10 mg by mouth as needed for migraine. May repeat in 2 hours if needed Patient not taking: Reported on 12/16/2023    [provider]  valACYclovir (VALTREX) 1000 MG tablet  07/14/16   [provider]    Family History Family History  Problem Relation Age of Onset   Ovarian cancer Maternal Aunt 30   Uterine cancer Mother        18s   COPD Mother    Hypertension Mother    Uterine cancer Maternal Grandmother        ? age   Heart attack Father 25   Other Father        DDD    Social History Social History[1]   Allergies   Hydrocodone-acetaminophen , Hydrocodone-acetaminophen , Morphine, Morphine and codeine, Cymbalta [duloxetine hcl], Doxycycline, Duloxetine, and Pregabalin   Review of Systems Review of Systems  Constitutional:  Positive for fatigue. Negative for fever.  HENT:  Positive for mouth sores. Negative for congestion.   Respiratory:  Negative for cough.   Gastrointestinal:  Negative for abdominal pain.  Genitourinary:  Positive for genital sores and vaginal pain. Negative for dysuria and vaginal discharge.  Neurological:  Negative for weakness.     Physical Exam Triage Vital Signs ED Triage Vitals  Encounter Vitals Group     BP 05/19/24 0913 123/71     Girls Systolic BP Percentile --      Girls Diastolic BP Percentile --      Boys Systolic BP Percentile --      Boys Diastolic BP Percentile --      Pulse Rate 05/19/24 0913 62     Resp --      Temp 05/19/24 0913 98 F (36.7 C)     Temp Source 05/19/24 0913 Oral     SpO2 05/19/24 0913 100 %  Weight 05/19/24 0913 166 lb 6.4 oz (75.5 kg)      Height --      Head Circumference --      Peak Flow --      Pain Score 05/19/24 0912 6     Pain Loc --      Pain Education --      Exclude from Growth Chart --    No data found.  Updated Vital Signs BP 123/71 (BP Location: Left Arm)   Pulse 62   Temp 98 F (36.7 C) (Oral)   Wt 166 lb 6.4 oz (75.5 kg)   SpO2 100%   BMI 30.43 kg/m     Physical Exam Vitals and nursing note reviewed.  Constitutional:      General: She is not in acute distress.    Appearance: Normal appearance. She is not ill-appearing or toxic-appearing.  HENT:     Head: Normocephalic and atraumatic.     Nose: Nose normal.     Mouth/Throat:     Mouth: Mucous membranes are moist.     Pharynx: Oropharynx is clear.     Comments: Faint erythematous lesion inner lower lip Eyes:     General: No scleral icterus.       Right eye: No discharge.        Left eye: No discharge.     Conjunctiva/sclera: Conjunctivae normal.  Cardiovascular:     Rate and Rhythm: Normal rate.  Pulmonary:     Effort: Pulmonary effort is normal. No respiratory distress.  Genitourinary:    Exam position: Lithotomy position.     Comments: 3 small ulcerations with erythematous base right labia majora Musculoskeletal:     Cervical back: Neck supple.  Skin:    General: Skin is dry.  Neurological:     General: No focal deficit present.     Mental Status: She is alert. Mental status is at baseline.     Motor: No weakness.     Gait: Gait normal.  Psychiatric:        Mood and Affect: Mood normal.        Behavior: Behavior normal.      UC Treatments / Results  Labs (all labs ordered are listed, but only abnormal results are displayed) Labs Reviewed  HSV CULTURE AND TYPING    EKG   Radiology No results found.  Procedures Procedures (including critical care time)  Medications Ordered in UC Medications - No data to display  Initial Impression / Assessment and Plan / UC Course  I have reviewed the triage vital signs and  the nursing notes.  Pertinent labs & imaging results that were available during my care of the patient were reviewed by me and considered in my medical decision making (see chart for details).   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  Also has a small ulceration of the lower lip.  She spoke with her rheumatologist and they wanted her to be checked for herpes and have that ruled out.  She has had lesions on her vulva before but says they normally go away quickly and she has not had it tested for herpes.  On evaluation she has 3 tiny ulcerations of the vulva.  The largest ulceration was swabbed for HSV culture and typing.  Will await results before treating as her condition is most consistent with ulcerations related to Behcet's.  Will treat with topical corticosteroids as per guidelines.  Also sent topical triamcinolone  paste for the small oral lesion.  Advised to follow-up with rheumatology as scheduled.  Flare of chronic underlying condition.   Final Clinical Impressions(s) / UC Diagnoses   Final diagnoses:  Vulvar lesion  Mouth lesion  Behcet's disease [facility])     Discharge Instructions      - Testing for herpes.  Will contact you if is positive and treat you further. 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  I sent a topical for your lip and for the vulva. - Continue following up with rheumatology.   ED Prescriptions     Medication Sig Dispense Auth. Provider   triamcinolone  ointment (KENALOG ) 0.1 % Apply 1 Application topically 2 (two) times daily. Apply to vulvar lesions BID 30 g Arvis Huxley B, PA-C   triamcinolone  (KENALOG ) 0.1 % paste Use as directed 1 Application in the mouth or throat 2 (two) times daily. 5 g Arvis Huxley NOVAK, PA-C      PDMP not reviewed this encounter.    [1]  Social History Tobacco Use   Smoking status: Never   Smokeless  tobacco: Never  Vaping Use   Vaping status: Never Used  Substance Use Topics   Alcohol use: Not Currently   Drug use: Never     Arvis Huxley NOVAK, PA-C 05/19/24 1017  "

## 2024-05-21 ENCOUNTER — Ambulatory Visit (HOSPITAL_COMMUNITY): Payer: Self-pay

## 2024-05-21 LAB — HSV CULTURE AND TYPING

## 2024-05-22 ENCOUNTER — Ambulatory Visit
Admission: EM | Admit: 2024-05-22 | Discharge: 2024-05-22 | Disposition: A | Discharge status: Home / Self Care | Attending: Emergency Medicine | Admitting: Emergency Medicine

## 2024-05-22 DIAGNOSIS — N766 Ulceration of vulva: Secondary | ICD-10-CM | POA: Insufficient documentation

## 2024-05-22 DIAGNOSIS — J387 Other diseases of larynx: Secondary | ICD-10-CM | POA: Insufficient documentation

## 2024-05-22 NOTE — ED Triage Notes (Signed)
 Pt was seen 1/3 HSV swab rejected.. Pt is here for a re-swab

## 2024-05-22 NOTE — Discharge Instructions (Signed)
 We will contact you if your testing comes back positive and call in Valtrex.  In the meantime, make sure you drink plenty of extra fluids.  Some people find salt water gargles and  Traditional Medicinal's Throat Coat tea helpful. Take 5 mL of liquid Benadryl  and 5 mL of Maalox/Mylanta. Mix it together, and then hold it in your mouth for as long as you can and then swallow. You may do this 4 times a day.  Honey and lemon dissolved in hot water can also be soothing.  Go to www.goodrx.com  or www.costplusdrugs.com to look up your medications. This will give you a list of where you can find your prescriptions at the most affordable prices. Or ask the pharmacist what the cash price is, or if they have any other discount programs available to help make your medication more affordable. This can be less expensive than what you would pay with insurance.

## 2024-05-22 NOTE — ED Provider Notes (Signed)
 HPI  SUBJECTIVE:  Amy Huynh is a 69 y.o. female who presents for reswabbing of a genital lesion.  She also reports painful throat lesions.  She has had symptoms like this before, but it has not been definitively diagnosed as herpes or Behcet's.  She is being treated for Behcet's by rheumatology.  She has a past medical history of HSV, cannot remember which type, possible Bechet's, fibromyalgia, Sjorgen's disease.  Past Medical History:  Diagnosis Date   Arthritis    DDD (degenerative disc disease), cervical    DDD (degenerative disc disease), lumbar    Family history of ovarian cancer 12/2016   MyRisk neg   Fibromyalgia    Migraine    Neuropathy    Placenta previa    Sjogren's disease     Past Surgical History:  Procedure Laterality Date   BIOPSY N/A 06/26/2021   Procedure: BIOPSY;  Surgeon: Jinny Carmine, MD;  Location: [facility];  Service: Endoscopy;  Laterality: N/A;   CERVICAL FUSION     CESAREAN SECTION     CHOLECYSTECTOMY     COLONOSCOPY N/A 11/22/2022   Procedure: COLONOSCOPY;  Surgeon: Toledo, Ladell POUR, MD;  Location: [facility];  Service: Gastroenterology;  Laterality: N/A;   ESOPHAGOGASTRODUODENOSCOPY (EGD) WITH PROPOFOL  N/A 06/26/2021   Procedure: ESOPHAGOGASTRODUODENOSCOPY (EGD) WITH PROPOFOL ;  Surgeon: Jinny Carmine, MD;  Location: [facility];  Service: Endoscopy;  Laterality: N/A;   HERNIA REPAIR     IR EMBO ART  VEN HEMORR LYMPH EXTRAV  INC GUIDE ROADMAPPING  12/16/2023   IR RADIOLOGIST EVAL & MGMT  11/24/2023   IR RADIOLOGIST EVAL & MGMT  12/29/2023   LAPAROTOMY  03/11/2022   Procedure: EXPLORATORY LAPAROTOMY Mesenteric mass biopsy;  Surgeon: Tye Millet, DO;  Location: [facility];  Service: General;;   REPLACEMENT TOTAL KNEE Left    SHOULDER SURGERY Right     Family History  Problem Relation Age of Onset   Ovarian cancer Maternal Aunt 30   Uterine cancer Mother        25s   COPD Mother    Hypertension Mother    Uterine  cancer Maternal Grandmother        ? age   Heart attack Father 35   Other Father        DDD    Social History[1]  Current Medications[2]  Allergies[3]   ROS  As noted in HPI.   Physical Exam  There were no vitals taken for this visit.  Constitutional: Well developed, well nourished, no acute distress Eyes:  EOMI, conjunctiva normal bilaterally HENT: Normocephalic, atraumatic,mucus membranes moist.  Positive ulcers right oropharynx. Respiratory: Normal inspiratory effort Cardiovascular: Normal rate GI: nondistended skin: No rash, skin intact GU: Single mildly tender ulcer right labia..  No other genital lesions.  Chaperone present during exam Musculoskeletal: no deformities Neurologic: Alert & oriented x 3, no focal neuro deficits Psychiatric: Speech and behavior appropriate   ED Course   Medications - No data to display  Orders Placed This Encounter  Procedures   HSV 1/2 PCR (Surface)    Standing Status:   Standing    Number of Occurrences:   1    No results found for this or any previous visit (from the past 24 hours). No results found.  ED Clinical Impression  1. Genital ulcer, female   2. Throat ulcer      ED Assessment/Plan     Sending off another swab for HSV 1/2.  Will initiate treatment based off  of labs.  She is already on the appropriate medication for her Behcet's.  Will prescribe Valtrex if HSV 1 or 2 are positive.  Advised Benadryl /Maalox mixture for the sore throat.  No orders of the defined types were placed in this encounter.     *This clinic note was created using Dragon dictation software. Therefore, there may be occasional mistakes despite careful proofreading.  ?     [1]  Social History Tobacco Use   Smoking status: Never   Smokeless tobacco: Never  Vaping Use   Vaping status: Never Used  Substance Use Topics   Alcohol use: Not Currently   Drug use: Never  [2] No current facility-administered medications for this  encounter.  Current Outpatient Medications:    Apremilast  (OTEZLA ) 30 MG TABS, Take 30 mg by mouth 2 (two) times daily., Disp: , Rfl:    azithromycin  (ZITHROMAX ) 250 MG tablet, Take 250 mg by mouth daily. (Patient not taking: Reported on 12/16/2023), Disp: , Rfl:    butalbital -acetaminophen -caffeine  (FIORICET , ESGIC ) 50-325-40 MG tablet, Take 1 tablet by mouth every 6 (six) hours as needed for headache., Disp: , Rfl:    cetirizine (ZYRTEC) 10 MG tablet, Take 10 mg by mouth daily., Disp: , Rfl:    clobetasol  cream (TEMOVATE ) 0.05 %, Apply topically., Disp: , Rfl:    cyanocobalamin  1000 MCG tablet, Take 1,000 mcg by mouth daily., Disp: , Rfl:    cyclobenzaprine (FLEXERIL) 10 MG tablet, Take 10 mg by mouth 3 (three) times daily as needed for muscle spasms., Disp: , Rfl:    dexlansoprazole  (DEXILANT ) 60 MG capsule, TAKE 1 CAPSULE(60 MG) BY MOUTH DAILY, Disp: 90 capsule, Rfl: 0   DHEA 50 MG TABS, Take 1 tablet by mouth in the morning., Disp: , Rfl:    Eptinezumab-jjmr (VYEPTI) 100 MG/ML injection, Inject 100 mg into the vein every 3 (three) months. Last Saturday, Disp: , Rfl:    fexofenadine (ALLEGRA) 180 MG tablet, Take 180 mg by mouth daily., Disp: , Rfl:    folic acid  (FOLVITE ) 1 MG tablet, Take 1 mg by mouth daily., Disp: , Rfl:    furosemide (LASIX) 20 MG tablet, Take 20 mg by mouth daily., Disp: , Rfl:    gabapentin  (NEURONTIN ) 300 MG capsule, Take 900 mg by mouth at bedtime., Disp: , Rfl:    HYDROcodone-acetaminophen  (NORCO/VICODIN) 5-325 MG tablet, Take 1 tablet by mouth every 8 (eight) hours as needed for moderate pain (neurapthy). (Patient not taking: Reported on 12/16/2023), Disp: , Rfl:    hydrocortisone  (ANUSOL -HC) 2.5 % rectal cream, Place 1 Application rectally 2 (two) times daily. Apply pea-sized cream per rectum twice daily for 7 to 10 days., Disp: 30 g, Rfl: 0   hydrOXYzine  (ATARAX /VISTARIL ) 10 MG tablet, Take 10 mg by mouth every 8 (eight) hours as needed. (Patient not taking: Reported  on 12/16/2023), Disp: , Rfl:    hyoscyamine  (LEVSIN , ANASPAZ ) 0.125 MG tablet, Take 0.125 mg by mouth every 4 (four) hours as needed., Disp: , Rfl:    methocarbamol  (ROBAXIN ) 500 MG tablet, Take 500 mg by mouth every 8 (eight) hours as needed for muscle spasms (RLS)., Disp: , Rfl:    methotrexate  (RHEUMATREX) 2.5 MG tablet, Take 20 mg by mouth once a week. Take 4 tablets (10 mg ) by mouth in the morning and 4 tablets(10 mg by mouth in the evening on Saturdays (total= 20 mg), Disp: , Rfl:    ondansetron  (ZOFRAN -ODT) 4 MG disintegrating tablet, Take 1 tablet (4 mg total) by mouth every  8 (eight) hours as needed for nausea or vomiting., Disp: 20 tablet, Rfl: 0   oxyCODONE -acetaminophen  (PERCOCET/ROXICET) 5-325 MG tablet, Take 1 tablet by mouth at bedtime as needed., Disp: , Rfl:    pilocarpine  (SALAGEN ) 5 MG tablet, Take 5 mg by mouth 2 (two) times daily., Disp: , Rfl:    potassium chloride  (KLOR-CON ) 10 MEQ tablet, Take 40 mEq by mouth daily. Unsure of dosage, Disp: , Rfl:    promethazine  (PHENERGAN ) 25 MG tablet, Take 25 mg by mouth every 6 (six) hours as needed for nausea or vomiting., Disp: , Rfl:    rizatriptan (MAXALT) 10 MG tablet, Take 10 mg by mouth as needed for migraine. May repeat in 2 hours if needed (Patient not taking: Reported on 12/16/2023), Disp: , Rfl:    rOPINIRole  (REQUIP ) 2 MG tablet, Take 2 mg by mouth every evening., Disp: , Rfl:    topiramate  (TOPAMAX ) 100 MG tablet, Take 200 mg by mouth at bedtime., Disp: , Rfl:    triamcinolone  (KENALOG ) 0.1 % paste, Use as directed 1 Application in the mouth or throat 2 (two) times daily., Disp: 5 g, Rfl: 12   triamcinolone  ointment (KENALOG ) 0.1 %, Apply 1 Application topically 2 (two) times daily. Apply to vulvar lesions BID, Disp: 30 g, Rfl: 0   valACYclovir (VALTREX) 1000 MG tablet, , Disp: , Rfl:  [3]  Allergies Allergen Reactions   Hydrocodone-Acetaminophen      Other reaction(s): Other (See Comments) Severe headache    Hydrocodone-Acetaminophen      Other reaction(s): Headache, Other (See Comments) Severe headache Severe headache  Other reaction(s): Headache Severe headache Other reaction(s): Headache, Other (See Comments) Severe headache Severe headache   Morphine Itching and Other (See Comments)    Other reaction(s): Other (See Comments) Morphine in high doses causes itching & red face. Morphine in high doses causes itching & red face.  Morphine in high doses causes itching & red face. Other reaction(s): Other (See Comments) Morphine in high doses causes itching & red face. Morphine in high doses causes itching & red face. Morphine in high doses causes itching & red face.   Morphine And Codeine Itching    Morphine in high doses causes itching & red face.    Cymbalta [Duloxetine Hcl] Other (See Comments)    Serotonin Reaction   Doxycycline     Other reaction(s): Abdominal Pain GI intolerance   Duloxetine Other (See Comments)    Other reaction(s): Other (See Comments), Other (See Comments) Serotonin syndrome Serotonin reaction  Serotonin syndrome Other reaction(s): Other (See Comments), Other (See Comments) Serotonin syndrome Serotonin reaction   Pregabalin Other (See Comments)    Other reaction(s): Other (See Comments) Serotinon reaction serotinon reaction  Serotinon reaction Other reaction(s): Other (See Comments) Serotinon reaction serotinon reaction     Van Knee, MD 05/22/24 TRENNA

## 2024-05-24 LAB — HSV 1/2 PCR (SURFACE)
HSV-1 DNA: NOT DETECTED
HSV-2 DNA: NOT DETECTED

## 2024-05-28 ENCOUNTER — Ambulatory Visit: Payer: Self-pay | Admitting: Emergency Medicine
